# Patient Record
Sex: Female | Born: 1940 | Race: White | Hispanic: No | Marital: Married | Smoking: Former smoker
Health system: Southern US, Community
[De-identification: ages and names within clinical notes are randomized; demographics above are authoritative.]

## PROBLEM LIST (undated history)

## (undated) DIAGNOSIS — Z87442 Personal history of urinary calculi: Secondary | ICD-10-CM

## (undated) DIAGNOSIS — C50919 Malignant neoplasm of unspecified site of unspecified female breast: Secondary | ICD-10-CM

## (undated) DIAGNOSIS — K859 Acute pancreatitis without necrosis or infection, unspecified: Secondary | ICD-10-CM

## (undated) DIAGNOSIS — E785 Hyperlipidemia, unspecified: Secondary | ICD-10-CM

## (undated) DIAGNOSIS — F411 Generalized anxiety disorder: Secondary | ICD-10-CM

## (undated) DIAGNOSIS — J449 Chronic obstructive pulmonary disease, unspecified: Secondary | ICD-10-CM

## (undated) DIAGNOSIS — F32A Depression, unspecified: Secondary | ICD-10-CM

## (undated) DIAGNOSIS — D649 Anemia, unspecified: Secondary | ICD-10-CM

## (undated) DIAGNOSIS — R0600 Dyspnea, unspecified: Secondary | ICD-10-CM

## (undated) DIAGNOSIS — K219 Gastro-esophageal reflux disease without esophagitis: Secondary | ICD-10-CM

## (undated) DIAGNOSIS — D126 Benign neoplasm of colon, unspecified: Secondary | ICD-10-CM

## (undated) DIAGNOSIS — I700 Atherosclerosis of aorta: Secondary | ICD-10-CM

## (undated) DIAGNOSIS — H3530 Unspecified macular degeneration: Secondary | ICD-10-CM

## (undated) DIAGNOSIS — R112 Nausea with vomiting, unspecified: Secondary | ICD-10-CM

## (undated) DIAGNOSIS — E119 Type 2 diabetes mellitus without complications: Secondary | ICD-10-CM

## (undated) DIAGNOSIS — M1990 Unspecified osteoarthritis, unspecified site: Secondary | ICD-10-CM

## (undated) DIAGNOSIS — N2889 Other specified disorders of kidney and ureter: Secondary | ICD-10-CM

## (undated) DIAGNOSIS — J189 Pneumonia, unspecified organism: Secondary | ICD-10-CM

## (undated) DIAGNOSIS — K862 Cyst of pancreas: Secondary | ICD-10-CM

## (undated) DIAGNOSIS — Z9889 Other specified postprocedural states: Secondary | ICD-10-CM

## (undated) DIAGNOSIS — M797 Fibromyalgia: Secondary | ICD-10-CM

## (undated) DIAGNOSIS — I10 Essential (primary) hypertension: Secondary | ICD-10-CM

## (undated) DIAGNOSIS — I82409 Acute embolism and thrombosis of unspecified deep veins of unspecified lower extremity: Secondary | ICD-10-CM

## (undated) DIAGNOSIS — I6529 Occlusion and stenosis of unspecified carotid artery: Secondary | ICD-10-CM

## (undated) DIAGNOSIS — K5790 Diverticulosis of intestine, part unspecified, without perforation or abscess without bleeding: Secondary | ICD-10-CM

## (undated) DIAGNOSIS — R000 Tachycardia, unspecified: Secondary | ICD-10-CM

## (undated) DIAGNOSIS — Z5189 Encounter for other specified aftercare: Secondary | ICD-10-CM

## (undated) DIAGNOSIS — Z85828 Personal history of other malignant neoplasm of skin: Secondary | ICD-10-CM

## (undated) DIAGNOSIS — B059 Measles without complication: Secondary | ICD-10-CM

## (undated) DIAGNOSIS — B269 Mumps without complication: Secondary | ICD-10-CM

## (undated) DIAGNOSIS — R911 Solitary pulmonary nodule: Secondary | ICD-10-CM

## (undated) DIAGNOSIS — K5900 Constipation, unspecified: Secondary | ICD-10-CM

## (undated) DIAGNOSIS — Z8489 Family history of other specified conditions: Secondary | ICD-10-CM

## (undated) DIAGNOSIS — L821 Other seborrheic keratosis: Secondary | ICD-10-CM

## (undated) DIAGNOSIS — Z87898 Personal history of other specified conditions: Secondary | ICD-10-CM

## (undated) DIAGNOSIS — M5030 Other cervical disc degeneration, unspecified cervical region: Secondary | ICD-10-CM

## (undated) DIAGNOSIS — M810 Age-related osteoporosis without current pathological fracture: Secondary | ICD-10-CM

## (undated) DIAGNOSIS — I499 Cardiac arrhythmia, unspecified: Secondary | ICD-10-CM

## (undated) HISTORY — DX: Encounter for other specified aftercare: Z51.89

## (undated) HISTORY — DX: Essential (primary) hypertension: I10

## (undated) HISTORY — DX: Type 2 diabetes mellitus without complications: E11.9

## (undated) HISTORY — DX: Solitary pulmonary nodule: R91.1

## (undated) HISTORY — PX: BASAL CELL CARCINOMA EXCISION: SHX1214

## (undated) HISTORY — DX: Generalized anxiety disorder: F41.1

## (undated) HISTORY — DX: Age-related osteoporosis without current pathological fracture: M81.0

## (undated) HISTORY — DX: Other seborrheic keratosis: L82.1

## (undated) HISTORY — DX: Hyperlipidemia, unspecified: E78.5

## (undated) HISTORY — DX: Unspecified macular degeneration: H35.30

## (undated) HISTORY — DX: Personal history of other specified conditions: Z87.898

## (undated) HISTORY — DX: Unspecified osteoarthritis, unspecified site: M19.90

## (undated) HISTORY — DX: Occlusion and stenosis of unspecified carotid artery: I65.29

## (undated) HISTORY — DX: Acute pancreatitis without necrosis or infection, unspecified: K85.90

## (undated) HISTORY — PX: COLONOSCOPY: SHX174

## (undated) HISTORY — DX: Malignant neoplasm of unspecified site of unspecified female breast: C50.919

## (undated) HISTORY — DX: Atherosclerosis of aorta: I70.0

## (undated) HISTORY — PX: POLYPECTOMY: SHX149

## (undated) HISTORY — PX: TONSILLECTOMY: SUR1361

## (undated) HISTORY — DX: Benign neoplasm of colon, unspecified: D12.6

## (undated) HISTORY — PX: PARTIAL HYSTERECTOMY: SHX80

## (undated) HISTORY — PX: BREAST REDUCTION SURGERY: SHX8

## (undated) HISTORY — PX: APPENDECTOMY: SHX54

## (undated) HISTORY — DX: Cyst of pancreas: K86.2

## (undated) HISTORY — DX: Other specified disorders of kidney and ureter: N28.89

## (undated) HISTORY — PX: MASTECTOMY: SHX3

## (undated) HISTORY — PX: ABDOMINAL HYSTERECTOMY: SHX81

## (undated) HISTORY — PX: FOOT SURGERY: SHX648

## (undated) HISTORY — PX: CATARACT EXTRACTION: SUR2

## (undated) HISTORY — PX: SHOULDER SURGERY: SHX246

---
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
## 1974-01-30 HISTORY — PX: BACK SURGERY: SHX140

## 1974-01-30 HISTORY — PX: ABDOMINAL HYSTERECTOMY: SHX81

## 1992-01-31 DIAGNOSIS — C50919 Malignant neoplasm of unspecified site of unspecified female breast: Secondary | ICD-10-CM

## 1992-01-31 HISTORY — PX: OTHER SURGICAL HISTORY: SHX169

## 1992-01-31 HISTORY — DX: Malignant neoplasm of unspecified site of unspecified female breast: C50.919

## 1997-07-06 ENCOUNTER — Ambulatory Visit (HOSPITAL_COMMUNITY): Admission: RE | Admit: 1997-07-06 | Discharge: 1997-07-06 | Payer: Self-pay | Admitting: Internal Medicine

## 1997-07-24 ENCOUNTER — Ambulatory Visit (HOSPITAL_COMMUNITY): Admission: RE | Admit: 1997-07-24 | Discharge: 1997-07-24 | Payer: Self-pay | Admitting: Internal Medicine

## 2000-01-31 DIAGNOSIS — I82409 Acute embolism and thrombosis of unspecified deep veins of unspecified lower extremity: Secondary | ICD-10-CM

## 2000-01-31 HISTORY — PX: KNEE SURGERY: SHX244

## 2000-01-31 HISTORY — DX: Acute embolism and thrombosis of unspecified deep veins of unspecified lower extremity: I82.409

## 2000-10-11 ENCOUNTER — Ambulatory Visit: Admission: RE | Admit: 2000-10-11 | Discharge: 2000-10-11 | Payer: Self-pay | Admitting: Orthopedic Surgery

## 2001-01-17 ENCOUNTER — Ambulatory Visit: Admission: RE | Admit: 2001-01-17 | Discharge: 2001-01-17 | Payer: Self-pay | Admitting: Orthopedic Surgery

## 2001-02-19 ENCOUNTER — Ambulatory Visit (HOSPITAL_COMMUNITY): Admission: RE | Admit: 2001-02-19 | Discharge: 2001-02-19 | Payer: Self-pay | Admitting: Orthopedic Surgery

## 2002-11-18 ENCOUNTER — Ambulatory Visit (HOSPITAL_COMMUNITY): Admission: RE | Admit: 2002-11-18 | Discharge: 2002-11-18 | Payer: Self-pay | Admitting: Internal Medicine

## 2003-01-28 ENCOUNTER — Other Ambulatory Visit: Admission: RE | Admit: 2003-01-28 | Discharge: 2003-01-28 | Payer: Self-pay | Admitting: Obstetrics and Gynecology

## 2003-06-03 ENCOUNTER — Ambulatory Visit (HOSPITAL_COMMUNITY): Admission: RE | Admit: 2003-06-03 | Discharge: 2003-06-03 | Payer: Self-pay | Admitting: Internal Medicine

## 2003-06-12 ENCOUNTER — Ambulatory Visit (HOSPITAL_COMMUNITY): Admission: RE | Admit: 2003-06-12 | Discharge: 2003-06-12 | Payer: Self-pay | Admitting: Internal Medicine

## 2003-12-08 ENCOUNTER — Ambulatory Visit: Payer: Self-pay | Admitting: Internal Medicine

## 2003-12-29 ENCOUNTER — Ambulatory Visit: Payer: Self-pay | Admitting: Internal Medicine

## 2004-01-28 ENCOUNTER — Ambulatory Visit: Payer: Self-pay | Admitting: Pulmonary Disease

## 2004-02-17 ENCOUNTER — Other Ambulatory Visit: Admission: RE | Admit: 2004-02-17 | Discharge: 2004-02-17 | Payer: Self-pay | Admitting: Obstetrics and Gynecology

## 2004-03-07 ENCOUNTER — Ambulatory Visit: Payer: Self-pay | Admitting: Internal Medicine

## 2004-04-04 ENCOUNTER — Ambulatory Visit: Payer: Self-pay | Admitting: Internal Medicine

## 2004-08-24 ENCOUNTER — Ambulatory Visit: Payer: Self-pay | Admitting: Adult Health

## 2004-09-08 ENCOUNTER — Ambulatory Visit: Payer: Self-pay | Admitting: Internal Medicine

## 2005-03-22 ENCOUNTER — Ambulatory Visit: Payer: Self-pay | Admitting: Gastroenterology

## 2005-04-12 ENCOUNTER — Ambulatory Visit: Payer: Self-pay | Admitting: Gastroenterology

## 2006-01-30 HISTORY — PX: KNEE ARTHROSCOPY: SUR90

## 2006-03-12 ENCOUNTER — Ambulatory Visit (HOSPITAL_BASED_OUTPATIENT_CLINIC_OR_DEPARTMENT_OTHER): Admission: RE | Admit: 2006-03-12 | Discharge: 2006-03-12 | Payer: Self-pay | Admitting: Orthopedic Surgery

## 2006-05-08 ENCOUNTER — Ambulatory Visit: Payer: Self-pay | Admitting: Gastroenterology

## 2006-05-08 LAB — CONVERTED CEMR LAB
Basophils Absolute: 0 10*3/uL (ref 0.0–0.1)
Basophils Relative: 0.7 % (ref 0.0–1.0)
Eosinophils Absolute: 0.1 10*3/uL (ref 0.0–0.6)
Eosinophils Relative: 1.1 % (ref 0.0–5.0)
HCT: 45.3 % (ref 36.0–46.0)
Hemoglobin: 15.5 g/dL — ABNORMAL HIGH (ref 12.0–15.0)
Lymphocytes Relative: 30.5 % (ref 12.0–46.0)
MCHC: 34.2 g/dL (ref 30.0–36.0)
MCV: 94 fL (ref 78.0–100.0)
Monocytes Absolute: 0.6 10*3/uL (ref 0.2–0.7)
Monocytes Relative: 8.4 % (ref 3.0–11.0)
Neutro Abs: 4.2 10*3/uL (ref 1.4–7.7)
Neutrophils Relative %: 59.3 % (ref 43.0–77.0)
Platelets: 332 10*3/uL (ref 150–400)
RBC: 4.82 M/uL (ref 3.87–5.11)
RDW: 11.6 % (ref 11.5–14.6)
Sed Rate: 15 mm/hr (ref 0–25)
WBC: 7 10*3/uL (ref 4.5–10.5)

## 2006-05-22 ENCOUNTER — Ambulatory Visit: Payer: Self-pay | Admitting: Gastroenterology

## 2006-06-18 ENCOUNTER — Ambulatory Visit: Payer: Self-pay | Admitting: Internal Medicine

## 2006-06-19 LAB — CONVERTED CEMR LAB
ALT: 11 units/L (ref 0–40)
AST: 20 units/L (ref 0–37)
Albumin: 4 g/dL (ref 3.5–5.2)
Alkaline Phosphatase: 86 units/L (ref 39–117)
BUN: 13 mg/dL (ref 6–23)
Basophils Absolute: 0 10*3/uL (ref 0.0–0.1)
Basophils Relative: 0 % (ref 0.0–1.0)
Bilirubin Urine: NEGATIVE
Bilirubin, Direct: 0.2 mg/dL (ref 0.0–0.3)
CO2: 32 meq/L (ref 19–32)
Calcium: 9.2 mg/dL (ref 8.4–10.5)
Chloride: 106 meq/L (ref 96–112)
Cholesterol: 176 mg/dL (ref 0–200)
Creatinine, Ser: 0.7 mg/dL (ref 0.4–1.2)
Crystals: NEGATIVE
Eosinophils Absolute: 0.1 10*3/uL (ref 0.0–0.6)
Eosinophils Relative: 1 % (ref 0.0–5.0)
GFR calc Af Amer: 108 mL/min
GFR calc non Af Amer: 89 mL/min
Glucose, Bld: 105 mg/dL — ABNORMAL HIGH (ref 70–99)
HCT: 40 % (ref 36.0–46.0)
HDL: 63.4 mg/dL (ref >39.0)
Hemoglobin: 13.9 g/dL (ref 12.0–15.0)
Ketones, ur: NEGATIVE mg/dL
LDL Cholesterol: 85 mg/dL (ref 0–99)
Lymphocytes Relative: 30.9 % (ref 12.0–46.0)
MCHC: 34.8 g/dL (ref 30.0–36.0)
MCV: 93.8 fL (ref 78.0–100.0)
Monocytes Absolute: 0.3 10*3/uL (ref 0.2–0.7)
Monocytes Relative: 4.5 % (ref 3.0–11.0)
Neutro Abs: 4 10*3/uL (ref 1.4–7.7)
Neutrophils Relative %: 63.6 % (ref 43.0–77.0)
Nitrite: NEGATIVE
Platelets: 268 10*3/uL (ref 150–400)
Potassium: 4.3 meq/L (ref 3.5–5.1)
RBC: 4.27 M/uL (ref 3.87–5.11)
RDW: 11.9 % (ref 11.5–14.6)
Sodium: 142 meq/L (ref 135–145)
Specific Gravity, Urine: 1.015 (ref 1.000–1.03)
TSH: 1.45 microintl units/mL (ref 0.35–5.50)
Total Bilirubin: 0.5 mg/dL (ref 0.3–1.2)
Total CHOL/HDL Ratio: 2.8
Total Protein: 6.7 g/dL (ref 6.0–8.3)
Triglycerides: 137 mg/dL (ref 0–149)
Urine Glucose: NEGATIVE mg/dL
Urobilinogen, UA: 0.2 (ref 0.0–1.0)
VLDL: 27 mg/dL (ref 0–40)
WBC: 6.4 10*3/uL (ref 4.5–10.5)
pH: 6 (ref 5.0–8.0)

## 2006-08-29 ENCOUNTER — Ambulatory Visit: Payer: Self-pay | Admitting: Internal Medicine

## 2006-08-29 LAB — CONVERTED CEMR LAB
ALT: 21 units/L (ref 0–35)
AST: 36 units/L (ref 0–37)
Albumin: 3.8 g/dL (ref 3.5–5.2)
Alkaline Phosphatase: 74 units/L (ref 39–117)
BUN: 9 mg/dL (ref 6–23)
Basophils Absolute: 0 10*3/uL (ref 0.0–0.1)
Basophils Relative: 0.4 % (ref 0.0–1.0)
Bilirubin, Direct: 0.1 mg/dL (ref 0.0–0.3)
CO2: 31 meq/L (ref 19–32)
Calcium: 9.4 mg/dL (ref 8.4–10.5)
Chloride: 102 meq/L (ref 96–112)
Creatinine, Ser: 0.6 mg/dL (ref 0.4–1.2)
Eosinophils Absolute: 0.1 10*3/uL (ref 0.0–0.6)
Eosinophils Relative: 1.2 % (ref 0.0–5.0)
GFR calc Af Amer: 129 mL/min
GFR calc non Af Amer: 106 mL/min
Glucose, Bld: 128 mg/dL — ABNORMAL HIGH (ref 70–99)
HCT: 38.5 % (ref 36.0–46.0)
Hemoglobin: 13.4 g/dL (ref 12.0–15.0)
Lymphocytes Relative: 25 % (ref 12.0–46.0)
MCHC: 34.8 g/dL (ref 30.0–36.0)
MCV: 94.4 fL (ref 78.0–100.0)
Monocytes Absolute: 0.4 10*3/uL (ref 0.2–0.7)
Monocytes Relative: 7.6 % (ref 3.0–11.0)
Neutro Abs: 3.6 10*3/uL (ref 1.4–7.7)
Neutrophils Relative %: 65.8 % (ref 43.0–77.0)
Platelets: 299 10*3/uL (ref 150–400)
Potassium: 4 meq/L (ref 3.5–5.1)
Pro B Natriuretic peptide (BNP): 10 pg/mL (ref 0.0–100.0)
RBC: 4.08 M/uL (ref 3.87–5.11)
RDW: 12.8 % (ref 11.5–14.6)
Sed Rate: 36 mm/hr — ABNORMAL HIGH (ref 0–25)
Sodium: 140 meq/L (ref 135–145)
Total Bilirubin: 0.7 mg/dL (ref 0.3–1.2)
Total Protein: 7 g/dL (ref 6.0–8.3)
WBC: 5.5 10*3/uL (ref 4.5–10.5)

## 2006-09-05 ENCOUNTER — Ambulatory Visit: Payer: Self-pay | Admitting: Internal Medicine

## 2006-09-10 ENCOUNTER — Ambulatory Visit: Payer: Self-pay | Admitting: Internal Medicine

## 2006-09-23 ENCOUNTER — Emergency Department (HOSPITAL_COMMUNITY): Admission: EM | Admit: 2006-09-23 | Discharge: 2006-09-24 | Payer: Self-pay | Admitting: Emergency Medicine

## 2006-10-03 ENCOUNTER — Ambulatory Visit: Payer: Self-pay | Admitting: Internal Medicine

## 2006-10-18 ENCOUNTER — Ambulatory Visit: Admission: RE | Admit: 2006-10-18 | Discharge: 2006-10-18 | Payer: Self-pay | Admitting: Internal Medicine

## 2006-10-18 ENCOUNTER — Ambulatory Visit: Payer: Self-pay | Admitting: Internal Medicine

## 2006-10-18 ENCOUNTER — Encounter: Payer: Self-pay | Admitting: Internal Medicine

## 2006-10-21 ENCOUNTER — Encounter: Payer: Self-pay | Admitting: Internal Medicine

## 2006-10-31 ENCOUNTER — Ambulatory Visit: Payer: Self-pay | Admitting: Internal Medicine

## 2006-11-14 ENCOUNTER — Ambulatory Visit: Payer: Self-pay | Admitting: Internal Medicine

## 2007-04-22 ENCOUNTER — Ambulatory Visit (HOSPITAL_COMMUNITY): Admission: RE | Admit: 2007-04-22 | Discharge: 2007-04-22 | Payer: Self-pay | Admitting: Obstetrics and Gynecology

## 2007-12-13 ENCOUNTER — Emergency Department (HOSPITAL_COMMUNITY): Admission: EM | Admit: 2007-12-13 | Discharge: 2007-12-13 | Payer: Self-pay | Admitting: Emergency Medicine

## 2008-01-02 ENCOUNTER — Encounter: Admission: RE | Admit: 2008-01-02 | Discharge: 2008-01-27 | Payer: Self-pay | Admitting: Internal Medicine

## 2008-04-02 ENCOUNTER — Encounter: Payer: Self-pay | Admitting: *Deleted

## 2008-10-06 ENCOUNTER — Ambulatory Visit (HOSPITAL_COMMUNITY): Admission: RE | Admit: 2008-10-06 | Discharge: 2008-10-06 | Payer: Self-pay | Admitting: Internal Medicine

## 2008-10-18 ENCOUNTER — Encounter: Admission: RE | Admit: 2008-10-18 | Discharge: 2008-10-18 | Payer: Self-pay | Admitting: Orthopedic Surgery

## 2008-12-09 ENCOUNTER — Encounter: Admission: RE | Admit: 2008-12-09 | Discharge: 2009-01-27 | Payer: Self-pay | Admitting: Internal Medicine

## 2009-04-16 ENCOUNTER — Encounter: Admission: RE | Admit: 2009-04-16 | Discharge: 2009-04-16 | Payer: Self-pay | Admitting: Internal Medicine

## 2010-02-26 ENCOUNTER — Encounter
Admission: RE | Admit: 2010-02-26 | Discharge: 2010-02-26 | Payer: Self-pay | Source: Home / Self Care | Attending: Orthopedic Surgery | Admitting: Orthopedic Surgery

## 2010-03-01 NOTE — Assessment & Plan Note (Signed)
    Current Allergies: ! ASA           ]

## 2010-04-26 ENCOUNTER — Encounter (INDEPENDENT_AMBULATORY_CARE_PROVIDER_SITE_OTHER): Payer: Self-pay | Admitting: *Deleted

## 2010-05-03 NOTE — Letter (Signed)
Summary: Pre Visit Letter Revised  [facility]  [address] [place], [place] [address]   Phone: [phone]  Fax: [phone]        04/26/2010 MRN: [id number] Renee [name] [address] [place], [place]  [address]             Procedure Date:  05/23/2010 @ 11:00   Recall colon-Dr. Jarold Motto   Welcome to the Gastroenterology Division at [facility].    You are scheduled to see a nurse for your pre-procedure visit on 05/10/2010 at 10:00 on the 3rd floor at [facility], [address]  We ask that you try to arrive at our office 15 minutes prior to your appointment time to allow for check-in.  Please take a minute to review the attached form.  If you answer "Yes" to one or more of the questions on the first page, we ask that you call the person listed at your earliest opportunity.  If you answer "No" to all of the questions, please complete the rest of the form and bring it to your appointment.    Your nurse visit will consist of discussing your medical and surgical history, your immediate family medical history, and your medications.   If you are unable to list all of your medications on the form, please bring the medication bottles to your appointment and we will list them.  We will need to be aware of both prescribed and over the counter drugs.  We will need to know exact dosage information as well.    Please be prepared to read and sign documents such as consent forms, a financial agreement, and acknowledgement forms.  If necessary, and with your consent, a friend or relative is welcome to sit-in on the nurse visit with you.  Please bring your insurance card so that we may make a copy of it.  If your insurance requires a referral to see a specialist, please bring your referral form from your primary care physician.  No co-pay is required for this nurse visit.     If you cannot keep your appointment, please call [phone] to cancel or reschedule  prior to your appointment date.  This allows [place] the opportunity to schedule an appointment for another patient in need of care.    Thank you for choosing [facility] for your medical needs.  We appreciate the opportunity to care for you.  Please visit [place] at our website  to learn more about our practice.  Sincerely, The Gastroenterology Division

## 2010-05-12 ENCOUNTER — Ambulatory Visit (AMBULATORY_SURGERY_CENTER): Payer: Medicare Other | Admitting: *Deleted

## 2010-05-12 VITALS — Ht 63.0 in | Wt 144.2 lb

## 2010-05-12 DIAGNOSIS — Z860100 Personal history of colon polyps, unspecified: Secondary | ICD-10-CM

## 2010-05-12 MED ORDER — PEG-KCL-NACL-NASULF-NA ASC-C 100 G PO SOLR: ORAL | Status: DC

## 2010-05-20 ENCOUNTER — Encounter: Payer: Self-pay | Admitting: *Deleted

## 2010-05-23 ENCOUNTER — Encounter: Payer: Self-pay | Admitting: Gastroenterology

## 2010-05-23 ENCOUNTER — Ambulatory Visit (AMBULATORY_SURGERY_CENTER): Payer: Medicare Other | Admitting: Gastroenterology

## 2010-05-23 VITALS — BP 110/58 | HR 79 | Temp 98.0°F | Resp 18 | Ht 63.0 in | Wt 140.0 lb

## 2010-05-23 DIAGNOSIS — K620 Anal polyp: Secondary | ICD-10-CM

## 2010-05-23 DIAGNOSIS — K5730 Diverticulosis of large intestine without perforation or abscess without bleeding: Secondary | ICD-10-CM

## 2010-05-23 DIAGNOSIS — Z86010 Personal history of colonic polyps: Secondary | ICD-10-CM

## 2010-05-23 DIAGNOSIS — D126 Benign neoplasm of colon, unspecified: Secondary | ICD-10-CM

## 2010-05-23 DIAGNOSIS — K648 Other hemorrhoids: Secondary | ICD-10-CM

## 2010-05-23 MED ORDER — SODIUM CHLORIDE 0.9 % IV SOLN: 500.0000 mL | INTRAVENOUS | Status: DC

## 2010-05-23 NOTE — Patient Instructions (Signed)
Follow discharge instructions.  Continue previous medications.  Begin a high fiber diet, and increase your fluid intake.  Repeat colonoscopy in 5 years.

## 2010-05-24 ENCOUNTER — Telehealth: Payer: Self-pay | Admitting: *Deleted

## 2010-05-24 NOTE — Telephone Encounter (Signed)

## 2010-05-27 ENCOUNTER — Encounter: Payer: Self-pay | Admitting: Gastroenterology

## 2010-06-14 NOTE — Assessment & Plan Note (Signed)
[facility]                             PULMONARY OFFICE NOTE   NAME:Renee [name], [name]                     MRN:          [id number]  DATE:09/05/2006                            DOB:          February 23, 1940    HISTORY OF PRESENT ILLNESS:  Patient is a 70 year old white female  patient of Dr. Thurston Hole who is a former smoker who returns today for a 1  week followup.  Last visit patient had been having some increased  shortness of breath over the last 3 months.  Patient was felt to have  some upper airway instability and was recommended to stop her ACE  inhibitors.  Stop all fish oil supplements and Fosamax.  Patient  ambulatory pulse ox was unremarkable with no desaturations noted.  Lab  work was essentially unremarkable except for a slightly elevated sed  rate at 36.  Her BNP level was at 10.  Patient returns back today  reporting that she is substantially improved, shortness of breath is  totally resolved and her back and chest discomforts has also resolved.  Patient was supposed to have brought her medications in today to review  however only brought a list.  Her list is correct with our MAR.  Patient  does complain that over the last 2 days she has had a minimally  productive cough that seems to be improving.   PAST MEDICAL HISTORY:  Reviewed.   CURRENT MEDICATIONS:  Reviewed.   PHYSICAL EXAMINATION:  Patient is a very pleasant female in no acute  distress.  She is afebrile.  Blood pressure is 150/96, O2 saturation is  96% on room air.  HEENT:  Unremarkable.  NECK:  Supple without cervical adenopathy.  No JVD.  LUNGS:  Sounds are clear.  CARDIAC:  Regular rate and rhythm.  ABDOMEN:  Soft and nontender.  EXTREMITIES:  Warm without any edema.   IMPRESSION AND PLAN:  1. Dyspnea, questionable etiology with suspected upper airway      instability.  Patient's symptoms are much improved off of her      angiotensin converting enzyme inhibitors, fish oil  capsules and      Fosamax.  Patient's symptoms have totally resolved.  Patient will      continue off of fish oil supplements.  Patient's blood pressure is      slightly elevated and we will add in a new anti-hypertensive.  2. Hypertension, recently taken off of angiotensin converting enzyme      inhibitors due to upper airway irritation.  Patient will begin      Benicar 20 mg daily.  Will return back here for followup blood      pressure.  3. Complex medication regimen.  Patient's medication list was      reviewed.  Computerized medication calendar was completed for this      patient and reviewed in detail.      Rubye Oaks, NP  Electronically Signed      Charlaine Dalton. Sherene Sires, MD, [facility]  Electronically Signed   TP/MedQ  DD: 09/05/2006  DT: 09/05/2006  Job #: [id number]

## 2010-06-14 NOTE — Assessment & Plan Note (Signed)
[facility]                             PULMONARY OFFICE NOTE   NAME:Renee [name], Renee [name]                     MRN:          [id number]  DATE:11/14/2006                            DOB:          1940-12-11    HISTORY OF PRESENT ILLNESS:  The patient is a pleasant 70 year old white  female patient of Dr. Thurston Hole who has a known history of upper airway  cough syndrome that returns today for a 2-week followup.  Patient  recently underwent a bronchoscopy which showed no evidence of  endobronchial process.  Patient has aggressively been treated for reflux  and cough suppression.  Patient returns today reporting that her cough  is substantially improved and now has totally resolved.  Patient does  complain that she continues to have hoarseness which is not totally  resolved.  Patient had been recommended to use some Chlor-Trimeton.  Patient reports that she used it for a few days but has not used it over  the last couple weeks.  Patient denies any flare in symptoms since  discontinuing off of her Reglan.   PAST MEDICAL HISTORY:  Reviewed.   CURRENT MEDICATIONS:  Reviewed.   PHYSICAL EXAM:  Patient is a pleasant female in no acute distress, she  is afebrile with stable vital signs.  The O2 saturation is 96% on room  air.  HEENT:  Nasal mucosa is with some mild nonspecific turbinate edema .  Nontender, nondistended.  Posterior pharynx is clear without any  exudate.  NECK:  Supple without cervical adenopathy.  No JVD.  LUNG SOUNDS:  Clear to auscultation.  CARDIAC:  Regular rate.  ABDOMEN:  Soft and nontender.  EXTREMITIES:  Warm without any edema.   IMPRESSION AND PLAN:  1. Upper airway cough syndrome, much improved with aggressive reflux      and cough suppression regimen.  Patient will continue on her      present regimen and follow back up with Dr. Sherene Sires in 4 weeks or      sooner.  2. Persistent hoarseness.  Possibly secondary to rhinitis.  Patient  has been recommended to add Chlor-Trimeton back into her regimen on      an as-needed basis.  If symptoms are not improved within the next 3-      4 weeks, patient will need referral to [facility], Nose [facility] Throat for      further evaluation; to note, she is a previous smoker.  3. Complex medication regimen.  Patient medications were reviewed in      detail.  Patient      education was provided.  A computerized medication calendar was      completed for this patient and reviewed in detail.      Rubye Oaks, NP  Electronically Signed      Charlaine Dalton. Sherene Sires, MD, [facility]  Electronically Signed   TP/MedQ  DD: 11/14/2006  DT: 11/15/2006  Job #: [id number]

## 2010-06-14 NOTE — Assessment & Plan Note (Signed)
[facility]                             PULMONARY OFFICE NOTE   NAME:[name], Renee [name]                     MRN:          [id number]  DATE:09/10/2006                            DOB:          [birth year]    HISTORY:  This is a 70 year old white female with abrupt onset dyspnea  three months ago which was felt to be somewhat atypical and not  explained on examination or by walking saturations. I asked her to stop  ACE inhibitors and she said that she appeared to be much better on her  followup on July 21, at which time her blood pressure was 150/96 and she  was started back on Benicar. Since then, she has been having several  coughing paroxysms, dry in nature, but denies any recurrent dyspnea and  denies any chest pain, fevers, chills, sweats or excess sputum  production. There is no pattern to the cough in terms of the day or  obvious triggering factors. She has tried Delsym and Mucinex without any  benefit.   PHYSICAL EXAMINATION:  She is a depressed-appearing, ambulatory white  female, with a barking harsh quality upper airway cough that is dry  sounding. She is afebrile with stable vital signs.  HEENT: Reveals normal turbinates. Oropharynx is clear without any  evidence of excessive post-nasal drainage or cobblestoning.  NECK: Supple without cervical adenopathy or tenderness. Trachea is  midline without thyromegaly.  LUNGS: Lung fields are perfectly clear bilaterally to auscultation and  percussion with no coughing elicited on inspiratory or expiratory  maneuvers.  HEART: Regular rate and rhythm without murmur, gallop or rub.  ABDOMEN: Soft, benign.  EXTREMITIES: Warm without calf tenderness, cyanosis, clubbing or edema.   Hemoglobin saturation is 97% on room air.   Chest x-ray was ordered.   LABORATORY DATA:  From her previous visit for unexplained dyspnea  indicated a sed rate of 36, normal CBC, chemistry profile with a bicarb  level of 31.   BNP of 10.   We had walked her around the office with 3 full laps on the last visit  with no desaturation noted. Chest x-ray from July 30, was normal. Chest  x-ray is repeated today.   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  Previously, she had dyspnea of unclear etiology that could not be  reproduced in the office. I suspect that one unifying possibility is  reflux. This would be why she could not tolerate ACE inhibitors and why  now that she is coughing she cannot stop coughing. That is, any trauma  to the upper airway is going to be perpetuated in a patient who has  chronic reflux. I have recommended specifically that she not only  continue taking Nexium perfectly regularly before breakfast, but also  add Reglan 10 mg before meals and at bedtime while suppressing cough  with a combination of Delsym and Reglan.   I was discouraged that we had generated a medication calendar for her on  our last visit which she failed to bring back for followup. I emphasized  the importance of this again today  and regenerated a new calendar for  her dated today and then went line-by-line over it adding the plan to  add Reglan. I also at this point would reduce Benicar (not that I think  it is contributing to her cough, but is probably overtreating her blood  pressure) and is to take Delsym two teaspoons q12 supplement by Tramadol  50 mg q4 and only these two (I would not even use medicated lozenges  because they may have mint or menthol in them) and emphasized this issue  to the patient as well in the context of a GERD diet review.   FOLLOWUP:  Will see the patient back and is scheduled for followup  within the next 4 weeks and will see her back sooner if needed for  followup within two weeks anyway.     Charlaine Dalton. Sherene Sires, MD, [facility]  Electronically Signed    MBW/MedQ  DD: 09/10/2006  DT: 09/11/2006  Job #: [id number]   cc:   Soyla Murphy. Renee Crigler, M.D.

## 2010-06-14 NOTE — Assessment & Plan Note (Signed)
[facility]                             PULMONARY OFFICE NOTE   NAME:Rubenstein, YERALDIN LITZENBERGER                     MRN:          [id number]  DATE:08/29/2006                            DOB:          07-11-1940    PULMONARY EXTENDED ACUTE OFFICE EVALUATION:   HISTORY:  A 70 year old white female, former smoker, with new onset of  dyspnea over the last three months that occurred fairly abruptly but has  not progressed.  It occurs only when she is active.  She states that she  can walk flat in the home from room to room but cannot walk to the  mailbox without giving out due to dyspnea.  She has also noticed  fleeting episodes of chest discomfort that have no direct relation to  the dyspnea, do not occur with exertion, and are not reproducible but  last a few seconds in the left posterior chest.  She denies any  significant cough, fevers, chills, sweats, pleuritic component of the  pain, orthopnea, PND, or leg swelling.   PAST MEDICAL HISTORY:  1. Benign palpitations, workup with a normal echocardiogram in 2002.  2. Chronic fatigue.  3. Breast cancer, status post bilateral mastectomy in June, 1993.  4. Status post hysterectomy remotely.  5. History of GERD, followed by Dr. Jarold Motto.  6. Diverticulosis by colonoscopy in 2007.  7. Hyperlipidemia.  8. Osteopenia.  9. Borderline hypertension.   ALLERGIES:  None known.   MEDICATIONS:  Taken in detail from the patient's memory.  See column  dated July 30, 2006 for a list.   SOCIAL HISTORY:  She quit smoking about 12 years ago.  She denies any  significant alcohol use.   FAMILY HISTORY:  Negative for any form of cancer.  Rheumatism is on her  father's side of the family.  No thyroid disease or premature heart  disease to her knowledge.  There was alcoholism in her father.   REVIEW OF SYSTEMS:  Taken in detail and significant for multiple aches  and pains, for which she sees Dr. Phylliss Bob.  She also has  osteopenia.   PHYSICAL EXAMINATION:  This is an anxious and depressed-appearing  ambulatory, mildly obese white female in no acute distress.  Her weight  is 174 pounds, which is no real change in baseline.  HEENT:  Unremarkable.  Her oropharynx is clear.  NECK:  Supple without cervical adenopathy or tenderness.  Trachea is  midline.  No thyromegaly.  LUNGS:  Completely clear bilaterally to auscultation and percussion.  HEART:  Regular rhythm without murmur, rub or gallop.  ABDOMEN:  Soft, benign.  EXTREMITIES:  Warm without calf tenderness, clubbing, cyanosis or edema.   Chest x-ray and labs are pending.   I reviewed Dr. Renaldo Reel notes from 2005 with no specific diagnosis other  than depression/fibromyalgia and DJD.   We walked the patient around three laps, 185 feet each, with no  desaturation or obvious tachypnea.   IMPRESSION:  1. New onset of dyspnea of undetermined etiology:  We could not      reproduce this here in the office, although the patient said  it is      perfectly reproducible at home.  This brings up the issue as to      whether there is a functional aspect or problem.  I am going to      first check a broad lab profile for her, check a chest x-ray, and      have her return in one week, having eliminated ACE inhibitors from      her medication list and check her blood pressure off of ACE      inhibitors.  If she needs an antihypertensive, I would prefer,      based on the fact she has unexplained dyspnea, that it not be an      ACE inhibitor.  I have also reviewed an optimal way to take acid      reflux medications (before meals, not after meals) and reviewed a      diet with her as well in writing.   When she returns in a week, I would like her to bring all of her  medicines back to generate a new medication calendar, separating the  maintenance versus p.r.n.'s and go forward from there.  If her dyspnea  remains unexplained, the next step would be a CT scan of the  chest,  looking for occult thromboembolic disease.  If her BNP is elevated above  100, then she needs an echocardiogram, looking for occult left or right  heart failure.     Charlaine Dalton. Sherene Sires, MD, [facility]  Electronically Signed    MBW/MedQ  DD: 08/29/2006  DT: 08/30/2006  Job #: [id number]   cc:   Areatha Keas, M.D.

## 2010-06-14 NOTE — Op Note (Signed)
Renee [name], Renee [name]              ACCOUNT NO.:  [id number]   MEDICAL RECORD NO.:  [id number]          PATIENT TYPE:  AMB   LOCATION:  CARD                         [facility]AC[facility]   PHYSICIAN:  Casimiro Needle B. Sherene Sires, MD, FCCPDATE OF BIRTH:  [birth year]   DATE OF PROCEDURE:  10/18/2006  DATE OF DISCHARGE:                               OPERATIVE REPORT   REFERRING PHYSICIAN:  This patient is self-referred.   PROCEDURE:  Fiberoptic bronchoscopy, diagnostic with lavage.   HISTORY AND INDICATIONS:  Please see dictated office notes available on  the E-Chart system.  This patient has had a chronic cough with a history  of breast cancer and no evidence of metastatic disease and a normal  chest x-ray, but the concern is that she might have endobronchial source  for the cough, specifically endobronchial metastasis.   She agreed to the procedure after a full discussion of the risks,  benefits, and alternatives in the office setting.  There has been no  change in her history or exam since the office notes.   DESCRIPTION OF PROCEDURE:  The patient was premedicated with a total of  75 mg intravenous Demerol and 5 mg intravenous Versed for added sedation  and cough suppression while being continuously monitored by surface ECG  and oximetry.  She also received 1% lidocaine by updraft nebulizer and  1% lidocaine to the right naris.   Using a standard video fiberoptic bronchoscope, the right naris was  easily cannulated with good visualization of the entire oropharynx and  larynx.  The were no upper airway abnormalities and the cords moved  normally.   Using additional 1% lidocaine , the tracheobronchial tree was explored  bilaterally with the following findings:  1. There were no significant endobronchial findings.  All the airways      opened widely at subsegmental level.  2. I randomly lavaged the right middle lobe with adequate return and      sent this for cytology, AFB, and fungal stain and  culture.   IMPRESSION:  Cough, unclear etiology, no evidence of endobronchial  source.  Follow-up will be arranged in the outpatient setting within two  weeks.      Charlaine Dalton. Sherene Sires, MD, [facility]  Electronically Signed     MBW/MEDQ  D:  10/18/2006  T:  10/18/2006  Job:  [id number]

## 2010-06-14 NOTE — Assessment & Plan Note (Signed)
[facility]                             PULMONARY OFFICE NOTE   NAME:Spindler, MYRIAN BOTELLO                     MRN:          [id number]  DATE:10/31/2006                            DOB:          [birth year]    HISTORY:  This is a 70 year old white female with new onset cough in  July 2008 with the sensation that she had something stuck in her chest  (she pointed to the right perihilar area) with a normal chest x-ray. She  subsequently underwent bronchoscopy which showed no evidence of an  endobronchial process and states that her cough subsequent to the  bronchoscopy is gone. She continues to have hoarseness and an urge her  to clear her throat, however like something is stuck in her throat  now.   The patient previously underwent upper endoscopy in 2002 with no  evidence of esophagitis then. Bronchoscopy was performed on 9/18 with  negative cytology and no endobronchial abnormalities.   The patient denies any nocturnal symptoms, fevers, chills, or sweats.  She does have hoarseness that worsens as the day goes on. She says that  she has the sensation of post-nasal drainage that Chlor-Trimeton  eliminates.   For full information on medications, please see fact sheet column dated  10/31/2006, noting that she is using an edited medication calendar that  is unambiguous, but no longer user-friendly because of all the edits.   PHYSICAL EXAMINATION:  GENERAL:  She indeed does clearly her throat  frequently during the exam.  HEENT:  Nasal turbinates normal. Oropharynx clear.  NECK:  Supple without cervical adenopathy or tenderness. Trachea is  midline.  LUNGS:  Fields perfectly clear bilaterally to auscultation and  percussion with no localized generalized weakness or rhonchi.  CARDIAC:  Regular rate and rhythm with no murmurs, rubs, or gallops.  ABDOMEN:  Soft and benign.  EXTREMITIES:  Warm without calf tenderness, cyanosis, clubbing, or  edema.   I  reviewed the findings from bronchoscopy with the patient in detail.   IMPRESSION:  Upper airway cough syndrome that has improved somewhat from  the combination of high dose PPI therapy, elimination of oils, GERD  diet, and the addition of the Reglan. In addition, she seems some better  with the use of Chlor-Trimeton to eliminate post-nasal drip syndrome  from the differential.   However, we have not totally eradicated her problem. Using the reverse  therapeutic trial concept, I am going to ask her to stop Reglan to see  if any of her symptoms exacerbate. If they do, clearly she needs more  aggressive treatment directed at GERD with perhaps b.i.d. Nexium,  nightly H2 blockers, and the addition back of Reglan until she sees her  GI physician of record, Dr. Jarold Motto.   If stopping the Reglan makes no difference, the next step in the workup  would be to proceed with a sinus CT scan and perhaps an ENT referral.   The patient continues to struggle with maintenance versus p.r.n.  concepts and I have asked her to return to our nurse practitioner for  the generation of a medication calendar  in two weeks and referral either  for sinus CT scan or to Dr. Jarold Motto depending on her response to the  above changes which were reviewed with her in writing today.     Charlaine Dalton. Sherene Sires, MD, [facility]  Electronically Signed    MBW/MedQ  DD: 10/31/2006  DT: 11/01/2006  Job #: [id number]   cc:   Soyla Murphy. Renne Crigler, M.D.

## 2010-06-14 NOTE — Assessment & Plan Note (Signed)
[facility]                             PULMONARY OFFICE NOTE   NAME:Renee [name], Renee [name]                     MRN:          [id number]  DATE:10/03/2006                            DOB:          Jun 05, 1940    PULMONARY/EXTENDED FOLLOW-UP OFFICE VISIT:   HISTORY:  This is a 70 year old white female with chronic cough dating  back now several months, associated with a sensation that she has got  something stuck in her chest.  (She presents to her right perihilar  area.)  It is better overall since stopping ACE inhibitors, but she  continues to feel that there is something she needs to cough up.  However, her cough is entirely dry in nature.  It is worse after  speaking, but much better after stopping ACE inhibitors, being treated  for reflux, then baseline.   She denies any unintended weight loss, pleuritic pain, fevers, chills,  sweats, significant dyspnea, orthopnea, PND, leg swelling, or overt  sinus symptoms.   For full inventory of medications, which are reviewed in detail, please  see face sheet, dated October 03, 2006, noting that she ran out of  Benicar yesterday and wants to know if she should continue it.   PHYSICAL EXAMINATION:  GENERAL:  She is a pleasant, ambulatory white  female in no acute distress.  VITAL SIGNS:  Blood pressure 130/80.  HEENT: Unremarkable. Oropharynx clear.  LUNG FIELDS:  Clear bilaterally to auscultation and percussion, with no  generalized or localized wheezing or rhonchi.  HEART:  Regular rate and rhythm, without murmur, gallop, or rub.  ABDOMEN:  Soft, benign.  EXTREMITIES: Warm, without calf tenderness, cyanosis, clubbing, or  edema.   Chest x-ray was reviewed with the patient from September 10, 2006, and is  normal.   IMPRESSION:  1. Chronic cyclical coughing that is better off of ACE inhibitors and      while being treated for reflux, but not completely eradicated at      this point.  There is concern because  of this patient's history of      breast cancer that she could very well have endobronchial      metastasis causing chronic cough, especially since she localizes      the area of cough to the right chest in the perihilar distribution.      I have reviewed the x-ray again today.  I see nothing there but      would recommend proceeding with bronchoscopy if the sensation      continues after the following regimen:      a.     First, add Chlor-Trimeton over the counter to eliminate       postnasal drip syndrome from the differential.      b.     A 6-day course of prednisone, along with aggressive cough       suppression with a combination of Delsym and tramadol, and a 5-day       course of prednisone.  2. Hypertension.  Note she stopped Benicar yesterday, with an adequate      blood pressure today,  but the Benicar most likely on board.      Based on her history of hypertension, I would recommend that we add      back Maxzide 25 both to treat hypertension and also improve calcium      balance, and I agreed to go ahead and see her in the short run      until she establishes with a primary care physician and we have      eradicated the cough.  She is to call me back within a week if not      better to proceed directly to bronchoscopy to examine the airways,      especially on the right for any evidence of endobronchial      abnormality or unexplained cough.     Charlaine Dalton. Sherene Sires, MD, [facility]  Electronically Signed    MBW/MedQ  DD: 10/03/2006  DT: 10/03/2006  Job #: [id number]

## 2010-06-17 NOTE — Assessment & Plan Note (Signed)
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   I have seen Renee [name] for many years and she had colonoscopy a year ago  which demonstrated diverticulosis.  She has a long history of acid  reflux disease and is on maintenance Nexium therapy.   She recently has gone on a diet high in nuts and fiber because she was  found to have mild glucose intolerance.  She is followed by Renee [name].  She had an onset of constant, sharp, lower abdominal pain 5 days  ago, but no nausea and vomiting, upper GI or hepatobiliary complaints.  She saw her gynecologist, had a negative GYN exam, and apparently a  negative stool hemoccult card.  She has not had previous pain similar to  this episode.  She has not had any recent abdominal x-rays or CT scans.   PAST MEDICAL HISTORY:  Remarkable for hypertension,  hypercholesterolemia, degenerative arthritis, breast cancer with removal  13 years ago and reconstructive surgery, current kidney stones, previous  breast lumpectomy, and appendectomy.  She has also had surgery because  of a ruptured disk in her back.  She additionally has recurrent pyuria,  history of palpitations, chronic fatigue, and restless leg syndrome.   MEDICATIONS:  1. Nexium 40 mg a day.  2. Multivitamins daily.  3. Crestor 20 mg a day.  4. Calcium with vitamin D twice a day.  5. Zetia daily.  6. Fish oil 4 capsules a day.   Denies drug allergies.   FAMILY HISTORY:  Noncontributory in terms of gastrointestinal problems.   SOCIAL HISTORY:  The patient is married and lives with her husband.  She  is  retired.  She does not use alcohol or cigarettes.   REVIEW OF SYSTEMS:  Otherwise noncontributory, except for some mild  general malaise.  She denies currently cardiovascular, pulmonary,  genitourinary or gynecologic problems.  Her appetite is good and her  weight has been fairly stable.   PHYSICAL EXAMINATION:  She is a nontoxic, healthy-appearing white  female, appearing her stated age.  She is 5 feet, 5 inches tall and weighs 163 pounds.  Her blood pressure  is 138/82 and pulse was 70 and regular.  I could not appreciate stigmata of chronic liver disease.  Her chest was clear.  She was in a regular rhythm without murmurs, gallops, or rubs.  I could not appreciate abdominal extension, organomegaly, or masses.  She was tender in the left lower quadrant without rebound.  Bowel sounds  were normal.  Peripheral extremities were unremarkable.  Mental status was clear.  Inspection of the rectum was unremarkable and was the rectal exam.  There was hard, solid stool in the rectal vault and it was guaiac  negative.   ASSESSMENT:  Renee [name] appears to have acute diverticulitis which appears to  be mild in degree.  I cannot feel any masses, there was no rebound,  tenderness, or systemic complaints.   RECOMMENDATIONS:  1. Check CBC and diff.  2. I prescribed Cipro 500 mg twice a day, along with metronidazole 500      mg twice a day for a 2-week course.  Office followup in 2 weeks'      time.  3. Levsin p.r.n. sublingually every 4-6 hours with p.r.n. Darvocet 100      every 6 hours.  4. Low fiber diet as tolerated.  5. Continue other medications as listed above.     Renee Rea. Jarold Motto, MD, Renee [name], Renee [name]  Electronically Signed    DRP/MedQ  DD: 05/08/2006  DT: 05/08/2006  Job #: [id number]   cc:   Renee [name]. Renee [name], M.D.

## 2010-06-17 NOTE — Op Note (Signed)
NAMEASPEN, Renee [name]              ACCOUNT NO.:  [id number]   MEDICAL RECORD NO.:  [id number]          PATIENT TYPE:  AMB   LOCATION:  [facility]SC                          FACILITY:  [facility]   PHYSICIAN:  Robert A. Thurston Hole, M.D. DATE OF BIRTH:  1940-11-03   DATE OF PROCEDURE:  03/12/2006  DATE OF DISCHARGE:                               OPERATIVE REPORT   PREOPERATIVE DIAGNOSES:  1. Right knee medial and lateral meniscal tears with chondromalacia      and synovitis.  2. Left knee chondromalacia.   POSTOPERATIVE DIAGNOSES:  1. Right knee medial and lateral meniscal tears with chondromalacia      and synovitis.  2. Left knee chondromalacia.   PROCEDURES:  1. Right knee examination under anesthesia, followed by arthroscopic      partial medial and lateral meniscectomies.  2. Right knee chondroplasty with partial synovectomy.  3. Left knee cortisone injection.   SURGEON:  Elana Alm. Thurston Hole, M.D.   ASSISTANT:  None.   ANESTHESIA:  Local with MAC.   OPERATIVE TIME:  30 minutes.   COMPLICATIONS:  None.   INDICATIONS FOR PROCEDURE:  Renee [name] is a 70 year old woman, who  has had significant bilateral knee pain, right worse than left over the  last 6 months, with exam and MRI documenting meniscal tear with  chondromalacia and synovitis.  She has failed conservative care and is  now to undergo arthroscopy.  Her left knee has also been bothering her  more significantly, and we will do a cortisone injection per her  request.   DESCRIPTION OF PROCEDURES:  Renee [name] was brought to the operating  room on March 12, 2006, after a knee block was placed in the right  knee by Anesthesia.  She was placed on the operating table in supine  position.  Her right knee was examined: range of motion from 0 to 125  degrees, 1 to 2+ crepitation, knee stable to ligamentous exam, with  normal patellar tracking.  Her left knee showed 1 to 2+ crepitation, 1+  synovitis, range of motion 0 to 120  degrees and the knee was stable with  a normal ligamentous exam.  The left knee was injected with 40 mg of  Depo-Medrol and 3 mL of 0.25% Marcaine.  The right leg was prepped using  sterile DuraPrep and draped using sterile technique.  Originally,  through an anterolateral portal, the arthroscope with a pump attached  was placed, and through an anteromedial portal an arthroscopic probe was  placed.  On initial inspection of the medial compartment, the articular  cartilage showed 75% grade 3 chondromalacia, which was debrided.  Medial  meniscal tear, posteromedial and anterior horn, of which 50% was  resected back to a stable rim.  Intercondylar notch inspected.  Anterior  and posterior cruciate ligaments were normal.  Lateral compartment  inspected: 30 to 40% grade 3 chondromalacia, which was debrided.  Lateral meniscus tear, posterior and lateral horn, of which 40 to 50%  was resected back to a stable rim.  The patellofemoral joint showed 50%  grade 3 changes on the patella and femoral groove and  this was debrided.  The patella tracked normally.  Moderate synovitis in the medial and  lateral gutters was debrided; otherwise, they were free of pathology.  After this was done and it was felt that all pathology had been  satisfactorily addressed, the instruments were removed.  The portals  were closed with 3-0 nylon suture and injected with 0.25% Marcaine with  epinephrine and 4 mg of morphine.  A sterile dressing was applied, and  the patient awakened and taken to the recovery room in stable condition.   FOLLOWUP CARE:  Renee [name] is going to be followed as an outpatient  on Tylenol Extra Strength, as well as Lovenox injections due to a  previous DVT.  See her back in the office for sutures out and followup.      Robert A. Thurston Hole, M.D.  Electronically Signed     RAW/MEDQ  D:  03/12/2006  T:  03/12/2006  Job:  [id number]

## 2010-11-10 LAB — FUNGUS CULTURE W SMEAR: Fungal Smear: NONE SEEN

## 2010-11-10 LAB — AFB CULTURE WITH SMEAR (NOT AT ARMC): Acid Fast Smear: NONE SEEN

## 2011-03-14 DIAGNOSIS — E7800 Pure hypercholesterolemia, unspecified: Secondary | ICD-10-CM | POA: Diagnosis not present

## 2011-03-14 DIAGNOSIS — Z79899 Other long term (current) drug therapy: Secondary | ICD-10-CM | POA: Diagnosis not present

## 2011-03-17 DIAGNOSIS — R7309 Other abnormal glucose: Secondary | ICD-10-CM | POA: Diagnosis not present

## 2011-03-17 DIAGNOSIS — E7800 Pure hypercholesterolemia, unspecified: Secondary | ICD-10-CM | POA: Diagnosis not present

## 2011-05-02 DIAGNOSIS — E7800 Pure hypercholesterolemia, unspecified: Secondary | ICD-10-CM | POA: Diagnosis not present

## 2011-05-02 DIAGNOSIS — Z79899 Other long term (current) drug therapy: Secondary | ICD-10-CM | POA: Diagnosis not present

## 2011-05-02 DIAGNOSIS — IMO0001 Reserved for inherently not codable concepts without codable children: Secondary | ICD-10-CM | POA: Diagnosis not present

## 2011-05-12 DIAGNOSIS — M25559 Pain in unspecified hip: Secondary | ICD-10-CM | POA: Diagnosis not present

## 2011-05-12 DIAGNOSIS — M1710 Unilateral primary osteoarthritis, unspecified knee: Secondary | ICD-10-CM | POA: Diagnosis not present

## 2011-05-12 DIAGNOSIS — IMO0002 Reserved for concepts with insufficient information to code with codable children: Secondary | ICD-10-CM | POA: Diagnosis not present

## 2011-06-09 ENCOUNTER — Other Ambulatory Visit: Payer: Self-pay | Admitting: Obstetrics and Gynecology

## 2011-06-09 DIAGNOSIS — Z124 Encounter for screening for malignant neoplasm of cervix: Secondary | ICD-10-CM | POA: Diagnosis not present

## 2011-06-09 DIAGNOSIS — Z01419 Encounter for gynecological examination (general) (routine) without abnormal findings: Secondary | ICD-10-CM | POA: Diagnosis not present

## 2011-06-15 DIAGNOSIS — M25559 Pain in unspecified hip: Secondary | ICD-10-CM | POA: Diagnosis not present

## 2011-06-15 DIAGNOSIS — R262 Difficulty in walking, not elsewhere classified: Secondary | ICD-10-CM | POA: Diagnosis not present

## 2011-06-20 DIAGNOSIS — R262 Difficulty in walking, not elsewhere classified: Secondary | ICD-10-CM | POA: Diagnosis not present

## 2011-06-20 DIAGNOSIS — M25559 Pain in unspecified hip: Secondary | ICD-10-CM | POA: Diagnosis not present

## 2011-06-20 DIAGNOSIS — E7800 Pure hypercholesterolemia, unspecified: Secondary | ICD-10-CM | POA: Diagnosis not present

## 2011-06-23 DIAGNOSIS — R262 Difficulty in walking, not elsewhere classified: Secondary | ICD-10-CM | POA: Diagnosis not present

## 2011-06-23 DIAGNOSIS — M25559 Pain in unspecified hip: Secondary | ICD-10-CM | POA: Diagnosis not present

## 2011-06-28 DIAGNOSIS — M25559 Pain in unspecified hip: Secondary | ICD-10-CM | POA: Diagnosis not present

## 2011-06-28 DIAGNOSIS — R262 Difficulty in walking, not elsewhere classified: Secondary | ICD-10-CM | POA: Diagnosis not present

## 2011-06-29 DIAGNOSIS — R262 Difficulty in walking, not elsewhere classified: Secondary | ICD-10-CM | POA: Diagnosis not present

## 2011-06-29 DIAGNOSIS — M25559 Pain in unspecified hip: Secondary | ICD-10-CM | POA: Diagnosis not present

## 2011-08-01 DIAGNOSIS — IMO0001 Reserved for inherently not codable concepts without codable children: Secondary | ICD-10-CM | POA: Diagnosis not present

## 2011-08-01 DIAGNOSIS — Z79899 Other long term (current) drug therapy: Secondary | ICD-10-CM | POA: Diagnosis not present

## 2011-08-01 DIAGNOSIS — E119 Type 2 diabetes mellitus without complications: Secondary | ICD-10-CM | POA: Diagnosis not present

## 2011-08-01 DIAGNOSIS — E7800 Pure hypercholesterolemia, unspecified: Secondary | ICD-10-CM | POA: Diagnosis not present

## 2011-08-01 DIAGNOSIS — Z0000 Encounter for general adult medical examination without abnormal findings: Secondary | ICD-10-CM | POA: Diagnosis not present

## 2011-08-07 DIAGNOSIS — M1990 Unspecified osteoarthritis, unspecified site: Secondary | ICD-10-CM | POA: Diagnosis not present

## 2011-08-07 DIAGNOSIS — E7800 Pure hypercholesterolemia, unspecified: Secondary | ICD-10-CM | POA: Diagnosis not present

## 2011-08-07 DIAGNOSIS — Z0000 Encounter for general adult medical examination without abnormal findings: Secondary | ICD-10-CM | POA: Diagnosis not present

## 2011-08-07 DIAGNOSIS — R252 Cramp and spasm: Secondary | ICD-10-CM | POA: Diagnosis not present

## 2011-08-07 DIAGNOSIS — E119 Type 2 diabetes mellitus without complications: Secondary | ICD-10-CM | POA: Diagnosis not present

## 2011-08-28 DIAGNOSIS — I10 Essential (primary) hypertension: Secondary | ICD-10-CM | POA: Diagnosis not present

## 2011-08-28 DIAGNOSIS — M1990 Unspecified osteoarthritis, unspecified site: Secondary | ICD-10-CM | POA: Diagnosis not present

## 2011-08-28 DIAGNOSIS — E7800 Pure hypercholesterolemia, unspecified: Secondary | ICD-10-CM | POA: Diagnosis not present

## 2011-08-28 DIAGNOSIS — E119 Type 2 diabetes mellitus without complications: Secondary | ICD-10-CM | POA: Diagnosis not present

## 2011-09-07 DIAGNOSIS — M545 Low back pain, unspecified: Secondary | ICD-10-CM | POA: Diagnosis not present

## 2011-11-07 DIAGNOSIS — H52229 Regular astigmatism, unspecified eye: Secondary | ICD-10-CM | POA: Diagnosis not present

## 2011-11-07 DIAGNOSIS — H5200 Hypermetropia, unspecified eye: Secondary | ICD-10-CM | POA: Diagnosis not present

## 2011-11-07 DIAGNOSIS — H269 Unspecified cataract: Secondary | ICD-10-CM | POA: Diagnosis not present

## 2011-11-07 DIAGNOSIS — H524 Presbyopia: Secondary | ICD-10-CM | POA: Diagnosis not present

## 2011-11-09 DIAGNOSIS — E119 Type 2 diabetes mellitus without complications: Secondary | ICD-10-CM | POA: Diagnosis not present

## 2011-11-14 DIAGNOSIS — I10 Essential (primary) hypertension: Secondary | ICD-10-CM | POA: Diagnosis not present

## 2011-11-14 DIAGNOSIS — E7800 Pure hypercholesterolemia, unspecified: Secondary | ICD-10-CM | POA: Diagnosis not present

## 2011-11-14 DIAGNOSIS — E119 Type 2 diabetes mellitus without complications: Secondary | ICD-10-CM | POA: Diagnosis not present

## 2011-11-17 DIAGNOSIS — R21 Rash and other nonspecific skin eruption: Secondary | ICD-10-CM | POA: Diagnosis not present

## 2011-11-17 DIAGNOSIS — L821 Other seborrheic keratosis: Secondary | ICD-10-CM | POA: Diagnosis not present

## 2011-11-17 DIAGNOSIS — L719 Rosacea, unspecified: Secondary | ICD-10-CM | POA: Diagnosis not present

## 2011-12-19 DIAGNOSIS — M79609 Pain in unspecified limb: Secondary | ICD-10-CM | POA: Diagnosis not present

## 2011-12-21 DIAGNOSIS — E119 Type 2 diabetes mellitus without complications: Secondary | ICD-10-CM | POA: Diagnosis not present

## 2011-12-21 DIAGNOSIS — E7800 Pure hypercholesterolemia, unspecified: Secondary | ICD-10-CM | POA: Diagnosis not present

## 2011-12-25 DIAGNOSIS — E119 Type 2 diabetes mellitus without complications: Secondary | ICD-10-CM | POA: Diagnosis not present

## 2011-12-25 DIAGNOSIS — I10 Essential (primary) hypertension: Secondary | ICD-10-CM | POA: Diagnosis not present

## 2011-12-25 DIAGNOSIS — E7800 Pure hypercholesterolemia, unspecified: Secondary | ICD-10-CM | POA: Diagnosis not present

## 2011-12-27 DIAGNOSIS — M25579 Pain in unspecified ankle and joints of unspecified foot: Secondary | ICD-10-CM | POA: Diagnosis not present

## 2012-01-03 DIAGNOSIS — M25559 Pain in unspecified hip: Secondary | ICD-10-CM | POA: Diagnosis not present

## 2012-01-03 DIAGNOSIS — R262 Difficulty in walking, not elsewhere classified: Secondary | ICD-10-CM | POA: Diagnosis not present

## 2012-01-04 DIAGNOSIS — IMO0002 Reserved for concepts with insufficient information to code with codable children: Secondary | ICD-10-CM | POA: Diagnosis not present

## 2012-01-04 DIAGNOSIS — M1710 Unilateral primary osteoarthritis, unspecified knee: Secondary | ICD-10-CM | POA: Diagnosis not present

## 2012-01-08 DIAGNOSIS — M25579 Pain in unspecified ankle and joints of unspecified foot: Secondary | ICD-10-CM | POA: Diagnosis not present

## 2012-01-11 DIAGNOSIS — M25579 Pain in unspecified ankle and joints of unspecified foot: Secondary | ICD-10-CM | POA: Diagnosis not present

## 2012-02-09 ENCOUNTER — Telehealth: Payer: Self-pay | Admitting: Internal Medicine

## 2012-02-09 ENCOUNTER — Ambulatory Visit (INDEPENDENT_AMBULATORY_CARE_PROVIDER_SITE_OTHER): Payer: Medicare Other | Admitting: Internal Medicine

## 2012-02-09 ENCOUNTER — Encounter: Payer: Self-pay | Admitting: Internal Medicine

## 2012-02-09 ENCOUNTER — Ambulatory Visit (INDEPENDENT_AMBULATORY_CARE_PROVIDER_SITE_OTHER)
Admission: RE | Admit: 2012-02-09 | Discharge: 2012-02-09 | Disposition: A | Discharge status: Home / Self Care | Payer: Medicare Other | Source: Ambulatory Visit | Attending: Internal Medicine | Admitting: Internal Medicine

## 2012-02-09 VITALS — BP 130/70 | HR 108 | Temp 98.0°F | Ht 65.0 in | Wt 168.2 lb

## 2012-02-09 DIAGNOSIS — R079 Chest pain, unspecified: Secondary | ICD-10-CM | POA: Insufficient documentation

## 2012-02-09 DIAGNOSIS — R0989 Other specified symptoms and signs involving the circulatory and respiratory systems: Secondary | ICD-10-CM

## 2012-02-09 DIAGNOSIS — R0609 Other forms of dyspnea: Secondary | ICD-10-CM

## 2012-02-09 DIAGNOSIS — J449 Chronic obstructive pulmonary disease, unspecified: Secondary | ICD-10-CM | POA: Diagnosis not present

## 2012-02-09 DIAGNOSIS — R0600 Dyspnea, unspecified: Secondary | ICD-10-CM

## 2012-02-09 NOTE — Progress Notes (Signed)
  Subjective:    Patient ID: Renee [name], female    DOB: [birth year]    MRN: [id number]  HPI   17 yowf quit smoking 1994 no respiratory problems referred 02/09/2012  by Dr Renne Crigler for evaluation of sob.   02/09/2012 1st pulmonary eval in EMR era cc onset of doe  several years prior to OV indolent onset but not really progressive, can walk a block and do housework x 30 min but struggles and has to sit down at end.  Also pain in back and under L ant rib cage comes and goes x sev weeks daytime, crampy, no pleuritic or ex features, no radiation or change in bowel or bladder habits  No obvious daytime variabilty or assoc chronic cough or cp or chest tightness, subjective wheeze overt sinus or hb symptoms. No unusual exp hx or h/o childhood pna/ asthma or premature birth to her knowledge.   Sleeping ok without nocturnal  or early am exacerbation  of respiratory  c/o's   Also denies any obvious fluctuation of symptoms with weather or environmental changes or other aggravating or alleviating factors except as outlined above   Review of Systems  Constitutional: Negative for fever, chills and unexpected weight change.  HENT: Positive for rhinorrhea. Negative for ear pain, nosebleeds, congestion, sore throat, sneezing, trouble swallowing, dental problem, voice change, postnasal drip and sinus pressure.   Eyes: Negative for visual disturbance.  Respiratory: Positive for shortness of breath. Negative for cough and choking.   Cardiovascular: Negative for chest pain and leg swelling.  Gastrointestinal: Negative for vomiting, abdominal pain and diarrhea.  Genitourinary: Negative for difficulty urinating.  Musculoskeletal: Positive for arthralgias.  Skin: Negative for rash.  Neurological: Negative for tremors, syncope and headaches.  Hematological: Does not bruise/bleed easily.       Objective:   Physical Exam  amb wf nad freq throat clearing "after drinking coffee"  Wt Readings from Last 3  Encounters:  02/09/12 168 lb 3.2 oz (76.295 kg)  05/23/10 140 lb (63.504 kg)  05/12/10 144 lb 3.2 oz (65.409 kg)    HEENT: nl dentition, turbinates, and orophanx. Nl external ear canals without cough reflex   NECK :  without JVD/Nodes/TM/ nl carotid upstrokes bilaterally   LUNGS: no acc muscle use, clear to A and P bilaterally without cough on insp or exp maneuvers   CV:  RRR  no s3 or murmur or increase in P2, no edema   ABD:  soft and nontender with nl excursion in the supine position. No bruits or organomegaly, bowel sounds nl  MS:  warm without deformities, calf tenderness, cyanosis or clubbing  SKIN: warm and dry without lesions    NEURO:  alert, approp, no deficits    CXR  02/09/2012 : COPD without acute abnormality.            Assessment & Plan:

## 2012-02-09 NOTE — Patient Instructions (Addendum)
Try off crestor, coQ 10 and fish oil for a month and also calcium citrate instead of carbonate.  GERD (REFLUX)  is an extremely common cause of respiratory symptoms, many times with no significant heartburn at all.    It can be treated with medication, but also with lifestyle changes including avoidance of late meals, excessive alcohol, smoking cessation, and avoid fatty foods, chocolate, peppermint, colas, red wine, and acidic juices such as orange juice.  NO MINT OR MENTHOL PRODUCTS SO NO COUGH DROPS  USE SUGARLESS CANDY INSTEAD (jolley ranchers or Stover's)  NO OIL BASED VITAMINS - use powdered substitutes.  As far the pain in your left upper quadrant it is likely related to gas Treatment consists of avoiding foods that cause gas (especially beans and raw vegetables like spinach and salads)  and citrucel 1 heaping tsp twice daily with a large glass of water.  Pain should improve w/in 2 weeks and if not then consider further GI work up.       Please remember to go to the x-ray department downstairs for your tests - we will call you with the results when they are available.  Please schedule a follow up office visit in 4 weeks, sooner if needed with pfts

## 2012-02-09 NOTE — Assessment & Plan Note (Signed)
Classic subdiaphragmatic pain pattern suggests ibs:  Stereotypical, migratory with a very limited distribution of pain locations, daytime, not exacerbated by ex or coughing, worse in sitting position, associated with generalized abd bloating, not present supine due to the dome effect of the diaphragm is  canceled in that position. Frequently these patients have had multiple negative GI workups and CT scans.  Treatment consists of avoiding foods that cause gas (especially beans and raw vegetables like spinach and salads)  and citrucel 1 heaping tsp twice daily with a large glass of water.  Pain should improve w/in 2 weeks and if not then consider further GI work up.     She has other aches and pains that sound like muscle cramps in chest and back > try off crestor x one month then rechallenge

## 2012-02-09 NOTE — Assessment & Plan Note (Addendum)
-   02/09/2012  Walked RA x 3 laps @ 185 ft each stopped due to  End of study no desat  Assoc with wt gain.   When respiratory symptoms begin or become refractory well after a patient reports complete smoking cessation,  Especially when this wasn't the case while they were smoking, a red flag is raised based on the work of Dr Primitivo Gauze which states:  if you quit smoking when your best day FEV1 is still well preserved it is highly unlikely you will progress to severe disease.  That is to say, once the smoking stops,  the symptoms should not suddenly erupt or markedly worsen.  If so, the differential diagnosis should include  obesity/deconditioning,  LPR/Reflux/Aspiration syndromes,  occult CHF, or  especially side effect of medications commonly used in this population.    Try GERD diet/off fish oil and return in 4 weeks for pfts since prev h/o smoking but strongly doubt lung dz or copd here

## 2012-02-09 NOTE — Telephone Encounter (Signed)
Result Note     Call pt: Reviewed cxr and no acute change so no change in recommendations made at ov  --  I spoke with patient about results and she verbalized understanding and had no questions 

## 2012-02-14 ENCOUNTER — Encounter: Payer: Self-pay | Admitting: Internal Medicine

## 2012-02-14 DIAGNOSIS — R05 Cough: Secondary | ICD-10-CM | POA: Insufficient documentation

## 2012-02-14 DIAGNOSIS — R059 Cough, unspecified: Secondary | ICD-10-CM | POA: Insufficient documentation

## 2012-02-16 DIAGNOSIS — M1710 Unilateral primary osteoarthritis, unspecified knee: Secondary | ICD-10-CM | POA: Diagnosis not present

## 2012-02-16 DIAGNOSIS — IMO0002 Reserved for concepts with insufficient information to code with codable children: Secondary | ICD-10-CM | POA: Diagnosis not present

## 2012-02-22 DIAGNOSIS — IMO0002 Reserved for concepts with insufficient information to code with codable children: Secondary | ICD-10-CM | POA: Diagnosis not present

## 2012-02-22 DIAGNOSIS — M1710 Unilateral primary osteoarthritis, unspecified knee: Secondary | ICD-10-CM | POA: Diagnosis not present

## 2012-02-27 DIAGNOSIS — M6240 Contracture of muscle, unspecified site: Secondary | ICD-10-CM | POA: Diagnosis not present

## 2012-02-27 DIAGNOSIS — M7660 Achilles tendinitis, unspecified leg: Secondary | ICD-10-CM | POA: Diagnosis not present

## 2012-02-28 DIAGNOSIS — M1710 Unilateral primary osteoarthritis, unspecified knee: Secondary | ICD-10-CM | POA: Diagnosis not present

## 2012-02-28 DIAGNOSIS — IMO0002 Reserved for concepts with insufficient information to code with codable children: Secondary | ICD-10-CM | POA: Diagnosis not present

## 2012-03-06 ENCOUNTER — Telehealth: Payer: Self-pay | Admitting: Internal Medicine

## 2012-03-06 NOTE — Telephone Encounter (Signed)
[email or web address] Messimer, CMA  

## 2012-03-07 NOTE — Telephone Encounter (Signed)
The pt is requesting a copy of her most recent cxr report be mailed to her home. Pt's address verified and report placed in the mail.

## 2012-03-08 ENCOUNTER — Ambulatory Visit: Payer: Medicare Other | Admitting: Internal Medicine

## 2012-03-26 DIAGNOSIS — E7800 Pure hypercholesterolemia, unspecified: Secondary | ICD-10-CM | POA: Diagnosis not present

## 2012-03-26 DIAGNOSIS — E119 Type 2 diabetes mellitus without complications: Secondary | ICD-10-CM | POA: Diagnosis not present

## 2012-03-28 DIAGNOSIS — I10 Essential (primary) hypertension: Secondary | ICD-10-CM | POA: Diagnosis not present

## 2012-03-28 DIAGNOSIS — IMO0001 Reserved for inherently not codable concepts without codable children: Secondary | ICD-10-CM | POA: Diagnosis not present

## 2012-03-28 DIAGNOSIS — E7800 Pure hypercholesterolemia, unspecified: Secondary | ICD-10-CM | POA: Diagnosis not present

## 2012-03-28 DIAGNOSIS — E119 Type 2 diabetes mellitus without complications: Secondary | ICD-10-CM | POA: Diagnosis not present

## 2012-04-04 DIAGNOSIS — M1710 Unilateral primary osteoarthritis, unspecified knee: Secondary | ICD-10-CM | POA: Diagnosis not present

## 2012-04-04 DIAGNOSIS — IMO0002 Reserved for concepts with insufficient information to code with codable children: Secondary | ICD-10-CM | POA: Diagnosis not present

## 2012-04-19 DIAGNOSIS — L719 Rosacea, unspecified: Secondary | ICD-10-CM | POA: Diagnosis not present

## 2012-04-19 DIAGNOSIS — L820 Inflamed seborrheic keratosis: Secondary | ICD-10-CM | POA: Diagnosis not present

## 2012-05-14 DIAGNOSIS — M7660 Achilles tendinitis, unspecified leg: Secondary | ICD-10-CM | POA: Diagnosis not present

## 2012-05-21 DIAGNOSIS — M19079 Primary osteoarthritis, unspecified ankle and foot: Secondary | ICD-10-CM | POA: Diagnosis not present

## 2012-05-24 DIAGNOSIS — M19079 Primary osteoarthritis, unspecified ankle and foot: Secondary | ICD-10-CM | POA: Diagnosis not present

## 2012-05-30 HISTORY — PX: OTHER SURGICAL HISTORY: SHX169

## 2012-05-31 ENCOUNTER — Encounter (INDEPENDENT_AMBULATORY_CARE_PROVIDER_SITE_OTHER): Payer: Medicare Other

## 2012-05-31 DIAGNOSIS — M7989 Other specified soft tissue disorders: Secondary | ICD-10-CM

## 2012-05-31 DIAGNOSIS — Z86718 Personal history of other venous thrombosis and embolism: Secondary | ICD-10-CM | POA: Diagnosis not present

## 2012-05-31 DIAGNOSIS — M79609 Pain in unspecified limb: Secondary | ICD-10-CM | POA: Diagnosis not present

## 2012-06-03 DIAGNOSIS — M66369 Spontaneous rupture of flexor tendons, unspecified lower leg: Secondary | ICD-10-CM | POA: Diagnosis not present

## 2012-06-03 DIAGNOSIS — M7730 Calcaneal spur, unspecified foot: Secondary | ICD-10-CM | POA: Diagnosis not present

## 2012-06-03 DIAGNOSIS — M7660 Achilles tendinitis, unspecified leg: Secondary | ICD-10-CM | POA: Diagnosis not present

## 2012-06-03 DIAGNOSIS — M898X9 Other specified disorders of bone, unspecified site: Secondary | ICD-10-CM | POA: Diagnosis not present

## 2012-06-03 DIAGNOSIS — G8918 Other acute postprocedural pain: Secondary | ICD-10-CM | POA: Diagnosis not present

## 2012-06-04 ENCOUNTER — Telehealth: Payer: Self-pay | Admitting: Cardiology

## 2012-06-04 NOTE — Telephone Encounter (Signed)
Lower Venous Report Faxed to Dr. Thurston Hole

## 2012-06-25 DIAGNOSIS — E7800 Pure hypercholesterolemia, unspecified: Secondary | ICD-10-CM | POA: Diagnosis not present

## 2012-06-25 DIAGNOSIS — E119 Type 2 diabetes mellitus without complications: Secondary | ICD-10-CM | POA: Diagnosis not present

## 2012-06-28 DIAGNOSIS — I10 Essential (primary) hypertension: Secondary | ICD-10-CM | POA: Diagnosis not present

## 2012-06-28 DIAGNOSIS — E119 Type 2 diabetes mellitus without complications: Secondary | ICD-10-CM | POA: Diagnosis not present

## 2012-06-28 DIAGNOSIS — E7800 Pure hypercholesterolemia, unspecified: Secondary | ICD-10-CM | POA: Diagnosis not present

## 2012-07-23 DIAGNOSIS — M66369 Spontaneous rupture of flexor tendons, unspecified lower leg: Secondary | ICD-10-CM | POA: Diagnosis not present

## 2012-07-23 DIAGNOSIS — Z4789 Encounter for other orthopedic aftercare: Secondary | ICD-10-CM | POA: Diagnosis not present

## 2012-07-23 DIAGNOSIS — M898X9 Other specified disorders of bone, unspecified site: Secondary | ICD-10-CM | POA: Diagnosis not present

## 2012-07-25 DIAGNOSIS — M898X9 Other specified disorders of bone, unspecified site: Secondary | ICD-10-CM | POA: Diagnosis not present

## 2012-07-25 DIAGNOSIS — M66369 Spontaneous rupture of flexor tendons, unspecified lower leg: Secondary | ICD-10-CM | POA: Diagnosis not present

## 2012-07-31 DIAGNOSIS — Z006 Encounter for examination for normal comparison and control in clinical research program: Secondary | ICD-10-CM | POA: Diagnosis not present

## 2012-07-31 DIAGNOSIS — I10 Essential (primary) hypertension: Secondary | ICD-10-CM | POA: Diagnosis not present

## 2012-07-31 DIAGNOSIS — M1990 Unspecified osteoarthritis, unspecified site: Secondary | ICD-10-CM | POA: Diagnosis not present

## 2012-07-31 DIAGNOSIS — E119 Type 2 diabetes mellitus without complications: Secondary | ICD-10-CM | POA: Diagnosis not present

## 2012-07-31 DIAGNOSIS — E7800 Pure hypercholesterolemia, unspecified: Secondary | ICD-10-CM | POA: Diagnosis not present

## 2012-08-06 DIAGNOSIS — E7800 Pure hypercholesterolemia, unspecified: Secondary | ICD-10-CM | POA: Diagnosis not present

## 2012-08-06 DIAGNOSIS — I10 Essential (primary) hypertension: Secondary | ICD-10-CM | POA: Diagnosis not present

## 2012-08-06 DIAGNOSIS — E119 Type 2 diabetes mellitus without complications: Secondary | ICD-10-CM | POA: Diagnosis not present

## 2012-08-06 DIAGNOSIS — N390 Urinary tract infection, site not specified: Secondary | ICD-10-CM | POA: Diagnosis not present

## 2012-08-08 DIAGNOSIS — E7800 Pure hypercholesterolemia, unspecified: Secondary | ICD-10-CM | POA: Diagnosis not present

## 2012-08-08 DIAGNOSIS — E119 Type 2 diabetes mellitus without complications: Secondary | ICD-10-CM | POA: Diagnosis not present

## 2012-08-08 DIAGNOSIS — I10 Essential (primary) hypertension: Secondary | ICD-10-CM | POA: Diagnosis not present

## 2012-08-14 DIAGNOSIS — M1710 Unilateral primary osteoarthritis, unspecified knee: Secondary | ICD-10-CM | POA: Diagnosis not present

## 2012-09-04 ENCOUNTER — Other Ambulatory Visit: Payer: Self-pay

## 2012-09-13 DIAGNOSIS — M1710 Unilateral primary osteoarthritis, unspecified knee: Secondary | ICD-10-CM | POA: Diagnosis not present

## 2012-09-27 ENCOUNTER — Other Ambulatory Visit: Payer: Self-pay | Admitting: Orthopedic Surgery

## 2012-09-27 ENCOUNTER — Encounter (HOSPITAL_COMMUNITY): Payer: Self-pay | Admitting: Pharmacy Technician

## 2012-10-04 ENCOUNTER — Other Ambulatory Visit (HOSPITAL_COMMUNITY): Payer: Self-pay | Admitting: Orthopedic Surgery

## 2012-10-04 NOTE — Patient Instructions (Addendum)
20 Renee [name]  10/04/2012   Your procedure is scheduled on: 10-14-2012  Report to [facility] at 625 AM.  Call this number if you have problems the morning of surgery [phone]   Remember:   Do not eat food or drink liquids :After Midnight.     Take these medicines the morning of surgery with A SIP OF WATER :PERCOCET IF NEEDED                                SEE [facility] PREPARING FOR SURGERY SHEET   Do not wear jewelry, make-up.  Do not wear lotions, powders, or perfumes. You may wear deodorant.   Men may shave face and neck.  Do not bring valuables to the hospital. [facility] IS NOT RESPONSIBLE FOR VALUEABLES.  Contacts, dentures or bridgework may not be worn into surgery.  Leave suitcase in the car. After surgery it may be brought to your room.  For patients admitted to the hospital, checkout time is 11:00 AM the day of discharge.   Patients discharged the day of surgery will not be allowed to drive home.  Name and phone number of your driver:  Special Instructions: N/A   Please read over the following fact sheets that you were given: mrsa information, blood fact sheet, incentive spirometer fact sheet  Call Cain Sieve RN pre op nurse if needed [phone]    FAILURE TO FOLLOW THESE INSTRUCTIONS MAY RESULT IN THE CANCELLATION OF YOUR SURGERY.  PATIENT SIGNATURE___________________________________________  NURSE SIGNATURE_____________________________________________

## 2012-10-04 NOTE — Progress Notes (Signed)
Chest xray 2 view 02-09-2012 epic

## 2012-10-07 ENCOUNTER — Encounter (HOSPITAL_COMMUNITY): Payer: Self-pay

## 2012-10-07 ENCOUNTER — Encounter (HOSPITAL_COMMUNITY)
Admission: RE | Admit: 2012-10-07 | Discharge: 2012-10-07 | Disposition: A | Discharge status: Home / Self Care | Payer: Medicare Other | Source: Ambulatory Visit | Attending: Orthopedic Surgery | Admitting: Orthopedic Surgery

## 2012-10-07 DIAGNOSIS — M1710 Unilateral primary osteoarthritis, unspecified knee: Secondary | ICD-10-CM | POA: Diagnosis not present

## 2012-10-07 DIAGNOSIS — Z01812 Encounter for preprocedural laboratory examination: Secondary | ICD-10-CM | POA: Diagnosis not present

## 2012-10-07 HISTORY — DX: Measles without complication: B05.9

## 2012-10-07 HISTORY — DX: Other specified postprocedural states: R11.2

## 2012-10-07 HISTORY — DX: Acute embolism and thrombosis of unspecified deep veins of unspecified lower extremity: I82.409

## 2012-10-07 HISTORY — DX: Fibromyalgia: M79.7

## 2012-10-07 HISTORY — DX: Mumps without complication: B26.9

## 2012-10-07 HISTORY — DX: Other cervical disc degeneration, unspecified cervical region: M50.30

## 2012-10-07 HISTORY — DX: Gastro-esophageal reflux disease without esophagitis: K21.9

## 2012-10-07 HISTORY — DX: Diverticulosis of intestine, part unspecified, without perforation or abscess without bleeding: K57.90

## 2012-10-07 HISTORY — DX: Other specified postprocedural states: Z98.890

## 2012-10-07 HISTORY — DX: Chronic obstructive pulmonary disease, unspecified: J44.9

## 2012-10-07 HISTORY — DX: Unspecified osteoarthritis, unspecified site: M19.90

## 2012-10-07 LAB — CBC
HCT: 42 % (ref 36.0–46.0)
Hemoglobin: 14.4 g/dL (ref 12.0–15.0)
MCH: 32.3 pg (ref 26.0–34.0)
MCHC: 34.3 g/dL (ref 30.0–36.0)
MCV: 94.2 fL (ref 78.0–100.0)
Platelets: 276 10*3/uL (ref 150–400)
RBC: 4.46 MIL/uL (ref 3.87–5.11)
RDW: 13 % (ref 11.5–15.5)
WBC: 6.8 10*3/uL (ref 4.0–10.5)

## 2012-10-07 LAB — COMPREHENSIVE METABOLIC PANEL
ALT: 8 U/L (ref 0–35)
AST: 16 U/L (ref 0–37)
Albumin: 3.9 g/dL (ref 3.5–5.2)
Alkaline Phosphatase: 96 U/L (ref 39–117)
BUN: 10 mg/dL (ref 6–23)
CO2: 31 mEq/L (ref 19–32)
Calcium: 9.7 mg/dL (ref 8.4–10.5)
Chloride: 99 mEq/L (ref 96–112)
Creatinine, Ser: 0.71 mg/dL (ref 0.50–1.10)
GFR calc Af Amer: >90 mL/min (ref >90)
GFR calc non Af Amer: 84 mL/min — ABNORMAL LOW (ref >90)
Glucose, Bld: 118 mg/dL — ABNORMAL HIGH (ref 70–99)
Potassium: 4.5 mEq/L (ref 3.5–5.1)
Sodium: 138 mEq/L (ref 135–145)
Total Bilirubin: 0.5 mg/dL (ref 0.3–1.2)
Total Protein: 7.3 g/dL (ref 6.0–8.3)

## 2012-10-07 LAB — URINALYSIS, ROUTINE W REFLEX MICROSCOPIC
Bilirubin Urine: NEGATIVE
Glucose, UA: NEGATIVE mg/dL
Hgb urine dipstick: NEGATIVE
Ketones, ur: NEGATIVE mg/dL
Nitrite: NEGATIVE
Protein, ur: NEGATIVE mg/dL
Specific Gravity, Urine: 1.032 — ABNORMAL HIGH (ref 1.005–1.030)
Urobilinogen, UA: 0.2 mg/dL (ref 0.0–1.0)
pH: 5.5 (ref 5.0–8.0)

## 2012-10-07 LAB — URINE MICROSCOPIC-ADD ON

## 2012-10-07 LAB — PROTIME-INR
INR: 0.94 (ref 0.00–1.49)
Prothrombin Time: 12.4 seconds (ref 11.6–15.2)

## 2012-10-07 LAB — APTT: aPTT: 32 seconds (ref 24–37)

## 2012-10-07 LAB — SURGICAL PCR SCREEN
MRSA, PCR: NEGATIVE
Staphylococcus aureus: NEGATIVE

## 2012-10-07 LAB — ABO/RH: ABO/RH(D): B NEG

## 2012-10-07 NOTE — Progress Notes (Signed)
CHEST XRAY 02-09-2012 EPIC EKG 07-31-12 DR [facility] ON CHART

## 2012-10-07 NOTE — Progress Notes (Signed)
Micro ua results faxed by epic to dr aluisio  

## 2012-10-08 NOTE — Progress Notes (Signed)
Fax received per [facility], no action on urine micro results, fax placed on chart

## 2012-10-13 ENCOUNTER — Other Ambulatory Visit: Payer: Self-pay | Admitting: Orthopedic Surgery

## 2012-10-13 NOTE — H&P (Signed)
Renee [name]  DOB: 10/14/1940 Married / Language: English / Race: White Female  Date of Admission:  10/14/2012  Chief Complaint:  Left Knee Pain  History of Present Illness The patient is a 72 year old female who comes in for a preoperative History and Physical. The patient is scheduled for a left total knee arthroplasty to be performed by Dr. Frank V. Aluisio, MD at [facility] on 10/14/2012. The patient is a 71 year old female who presents for follow up of their knee. The patient is being followed for their left knee pain and osteoarthritis. They are now month(s) out from Euflexxa series. Symptoms reported today include: pain, aching, stiffness, pain with weightbearing, difficulty ambulating and difficulty arising from chair. The patient feels that they are doing poorly and report their pain level to be moderate to severe. Current treatment includes: modified weightbearing (with the use of one crutch). Note for "Follow-up Knee": She had some surgery on her left heel and was in a cam walker. She said once the boot was taken off, she could not walk. She is ready to proceed with the total knee replacement. They have been treated conservatively in the past for the above stated problem and despite conservative measures, they continue to have progressive pain and severe functional limitations and dysfunction. They have failed non-operative management including home exercise, medications, and injections. It is felt that they would benefit from undergoing total joint replacement. Risks and benefits of the procedure have been discussed with the patient and they elect to proceed with surgery. There are no active contraindications to surgery such as ongoing infection or rapidly progressive neurological disease.   Problem List Primary osteoarthritis of both knees   Allergies Ibuprofen *ANALGESICS - ANTI-INFLAMMATORY*. blisters Aspirin *ANALGESICS - NonNarcotic* Codeine  Derivatives. Itching. Mood Changes (Vicodin) Lisinopril *ANTIHYPERTENSIVES*. Cough.   Family History No pertinent family history   Social History Illicit drug use. no Exercise. Exercises rarely; does running / walking Marital status. married Living situation. live with spouse Drug/Alcohol Rehab (Previously). no Children. 2 Alcohol use. current drinker; drinks wine; only occasionally per week Drug/Alcohol Rehab (Currently). no Current work status. retired Pain Contract. no Number of flights of stairs before winded. greater than 5 Tobacco / smoke exposure. no Tobacco use. former smoker; smoke(d) less than 1/2 pack(s) per day   Medication History Losartan Potassium-HCTZ (100-25MG Tablet, Oral) Active. Crestor (5MG Tablet, Oral) Active. Zetia (10MG Tablet, Oral) Active. Cymbalta (60MG Capsule DR Part, Oral) Active. Aspirin EC (81MG Tablet DR, Oral) Active. Co Q 10 ( Oral) Specific dose unknown - Active. Multivitamin ( Oral) Active. Vitamin C ( Oral) Active. Vitamin D3 Complete ( Oral) Active. Fish Oil Burp-Less (1200MG Capsule, Oral) Active.  Past Surgical History Appendectomy Foot Surgery. right Colon Polyp Removal - Colonoscopy Tonsillectomy Mammoplasty; Reduction. bilateral Hysterectomy. Date: 1976. partial (non-cancerous) Spinal Surgery. Date: 1976. Mastectomy. Date: 1994. bilateral Arthroscopic Knee Surgery - Left. Date: 2002. Postop complication - DVT Arthroscopic Knee Surgery - Right. Date: 2008.   Medical History Hypercholesterolemia Fibromyalgia Osteoporosis Osteoarthritis Breast Cancer DVT. Postop Left Knee Scope - 2002 COPD Gastroesophageal Reflux Disease Diverticulosis Urinary Tract Infection Transfusion history. 1948 Measles Mumps   Review of Systems General:Not Present- Chills, Fever, Night Sweats, Fatigue, Weight Gain, Weight Loss and Memory Loss. Skin:Not Present- Hives, Itching, Rash, Eczema and  Lesions. HEENT:Not Present- Tinnitus, Headache, Double Vision, Visual Loss, Hearing Loss and Dentures. Respiratory:Not Present- Shortness of breath with exertion, Shortness of breath at rest, Allergies, Coughing up blood and Chronic   Cough. Cardiovascular:Not Present- Chest Pain, Racing/skipping heartbeats, Difficulty Breathing Lying Down, Murmur, Swelling and Palpitations. Gastrointestinal:Present- Heartburn. Not Present- Bloody Stool, Abdominal Pain, Vomiting, Nausea, Constipation, Diarrhea, Difficulty Swallowing, Jaundice and Loss of appetitie. Female Genitourinary:Present- Urinating at Night. Not Present- Blood in Urine, Urinary frequency, Weak urinary stream, Discharge, Flank Pain, Incontinence, Painful Urination, Urgency and Urinary Retention. Musculoskeletal:Present- Muscle Weakness, Joint Swelling, Joint Pain, Morning Stiffness and Spasms. Not Present- Muscle Pain and Back Pain. Neurological:Not Present- Tremor, Dizziness, Blackout spells, Paralysis, Difficulty with balance and Weakness. Psychiatric:Not Present- Insomnia.   Vitals Pulse: 108 (Regular) Resp.: 16 (Unlabored) BP: 128/76 (Sitting, Right Arm, Standard)    Physical Exam The physical exam findings are as follows:  Note: Patient is a 72 year old female with continued knee pain.   General Mental Status - Alert, cooperative and good historian. General Appearance- pleasant. Not in acute distress. Orientation- Oriented X3. Build & Nutrition- Well nourished and Well developed.   Head and Neck Head- normocephalic, atraumatic . Neck Global Assessment- supple. no bruit auscultated on the right and no bruit auscultated on the left.   Eye Pupil- Bilateral- Regular and Round. Motion- Bilateral- EOMI.   Chest and Lung Exam Auscultation: Breath sounds:- clear at anterior chest wall and - clear at posterior chest wall. Adventitious sounds:- No Adventitious  sounds.   Cardiovascular Auscultation:Rhythm- Regular rate and rhythm. Heart Sounds- S1 WNL and S2 WNL. Murmurs & Other Heart Sounds:Auscultation of the heart reveals - No Murmurs.   Abdomen Palpation/Percussion:Tenderness- Abdomen is non-tender to palpation. Rigidity (guarding)- Abdomen is soft. Auscultation:Auscultation of the abdomen reveals - Bowel sounds normal.   Female Genitourinary Not done, not pertinent to present illness  Musculoskeletal She is a well developed female. She is alert and oriented. No apparent distress. Both knees show a various deformity. There is no effusion in either knee. Range is about 5-120 on each side with no instability.  Assessment & Plan Primary osteoarthritis of both knees (715.16) Impression: Left Knee Current Plans l Pt Education - How to access health information online: discussed with patient and provided information.  Note: Plan is for a Left Total Knee Replacement by Dr. Aluisio.  Plan is to go home.  PCP - Dr. Pharr  The patient will not receive TXA (tranexamic acid) due to: History of DVT postop knee scope - 2002  Time spent ~ 20 minutes  Signed electronically by Alexzandrew L Perkins, III PA-C 

## 2012-10-14 ENCOUNTER — Encounter (HOSPITAL_COMMUNITY)
Admission: RE | Disposition: A | Discharge status: Home Health | Payer: Self-pay | Source: Ambulatory Visit | Attending: Orthopedic Surgery

## 2012-10-14 ENCOUNTER — Encounter (HOSPITAL_COMMUNITY): Payer: Self-pay | Admitting: *Deleted

## 2012-10-14 ENCOUNTER — Inpatient Hospital Stay (HOSPITAL_COMMUNITY): Payer: Medicare Other | Admitting: Certified Registered Nurse Anesthetist

## 2012-10-14 ENCOUNTER — Encounter (HOSPITAL_COMMUNITY): Payer: Self-pay | Admitting: Certified Registered Nurse Anesthetist

## 2012-10-14 ENCOUNTER — Inpatient Hospital Stay (HOSPITAL_COMMUNITY)
Admission: RE | Admit: 2012-10-14 | Discharge: 2012-10-16 | DRG: 470 | Disposition: A | Discharge status: Home Health | Payer: Medicare Other | Source: Ambulatory Visit | Attending: Orthopedic Surgery | Admitting: Orthopedic Surgery

## 2012-10-14 DIAGNOSIS — E785 Hyperlipidemia, unspecified: Secondary | ICD-10-CM | POA: Diagnosis present

## 2012-10-14 DIAGNOSIS — R110 Nausea: Secondary | ICD-10-CM | POA: Diagnosis not present

## 2012-10-14 DIAGNOSIS — M810 Age-related osteoporosis without current pathological fracture: Secondary | ICD-10-CM | POA: Diagnosis present

## 2012-10-14 DIAGNOSIS — J449 Chronic obstructive pulmonary disease, unspecified: Secondary | ICD-10-CM | POA: Diagnosis not present

## 2012-10-14 DIAGNOSIS — D62 Acute posthemorrhagic anemia: Secondary | ICD-10-CM | POA: Diagnosis not present

## 2012-10-14 DIAGNOSIS — Z853 Personal history of malignant neoplasm of breast: Secondary | ICD-10-CM

## 2012-10-14 DIAGNOSIS — M1710 Unilateral primary osteoarthritis, unspecified knee: Secondary | ICD-10-CM | POA: Diagnosis not present

## 2012-10-14 DIAGNOSIS — I10 Essential (primary) hypertension: Secondary | ICD-10-CM | POA: Diagnosis present

## 2012-10-14 DIAGNOSIS — Z6829 Body mass index (BMI) 29.0-29.9, adult: Secondary | ICD-10-CM | POA: Diagnosis not present

## 2012-10-14 DIAGNOSIS — Z86718 Personal history of other venous thrombosis and embolism: Secondary | ICD-10-CM | POA: Diagnosis not present

## 2012-10-14 DIAGNOSIS — K219 Gastro-esophageal reflux disease without esophagitis: Secondary | ICD-10-CM | POA: Diagnosis present

## 2012-10-14 DIAGNOSIS — Z96652 Presence of left artificial knee joint: Secondary | ICD-10-CM

## 2012-10-14 DIAGNOSIS — M25569 Pain in unspecified knee: Secondary | ICD-10-CM | POA: Diagnosis not present

## 2012-10-14 DIAGNOSIS — J4489 Other specified chronic obstructive pulmonary disease: Secondary | ICD-10-CM | POA: Diagnosis present

## 2012-10-14 DIAGNOSIS — E7800 Pure hypercholesterolemia, unspecified: Secondary | ICD-10-CM | POA: Diagnosis present

## 2012-10-14 DIAGNOSIS — Z87891 Personal history of nicotine dependence: Secondary | ICD-10-CM | POA: Diagnosis not present

## 2012-10-14 DIAGNOSIS — Z01812 Encounter for preprocedural laboratory examination: Secondary | ICD-10-CM | POA: Diagnosis not present

## 2012-10-14 DIAGNOSIS — IMO0001 Reserved for inherently not codable concepts without codable children: Secondary | ICD-10-CM | POA: Diagnosis not present

## 2012-10-14 DIAGNOSIS — IMO0002 Reserved for concepts with insufficient information to code with codable children: Secondary | ICD-10-CM | POA: Diagnosis not present

## 2012-10-14 HISTORY — PX: TOTAL KNEE ARTHROPLASTY: SHX125

## 2012-10-14 LAB — TYPE AND SCREEN
ABO/RH(D): B NEG
Antibody Screen: NEGATIVE

## 2012-10-14 SURGERY — ARTHROPLASTY, KNEE, TOTAL
Anesthesia: Spinal | Site: Knee | Laterality: Left | Wound class: Clean

## 2012-10-14 MED ORDER — HYDROCHLOROTHIAZIDE 25 MG PO TABS
25.0000 mg | ORAL_TABLET | Freq: Every day | ORAL | Status: DC
  Administered 2012-10-15 – 2012-10-16 (×2): 25 mg via ORAL
  Filled 2012-10-14 (×2): qty 1

## 2012-10-14 MED ORDER — DEXAMETHASONE 6 MG PO TABS
10.0000 mg | ORAL_TABLET | Freq: Every day | ORAL | Status: AC
  Filled 2012-10-14: qty 1

## 2012-10-14 MED ORDER — MENTHOL 3 MG MT LOZG: 1.0000 | LOZENGE | OROMUCOSAL | Status: DC | PRN

## 2012-10-14 MED ORDER — METHOCARBAMOL 100 MG/ML IJ SOLN
500.0000 mg | Freq: Four times a day (QID) | INTRAVENOUS | Status: DC | PRN
  Administered 2012-10-14 (×2): 500 mg via INTRAVENOUS
  Filled 2012-10-14 (×2): qty 5

## 2012-10-14 MED ORDER — METOCLOPRAMIDE HCL 5 MG/ML IJ SOLN: 5.0000 mg | Freq: Three times a day (TID) | INTRAMUSCULAR | Status: DC | PRN

## 2012-10-14 MED ORDER — ACETAMINOPHEN 500 MG PO TABS
1000.0000 mg | ORAL_TABLET | Freq: Four times a day (QID) | ORAL | Status: AC
  Administered 2012-10-14 – 2012-10-15 (×4): 1000 mg via ORAL
  Filled 2012-10-14 (×6): qty 2

## 2012-10-14 MED ORDER — ONDANSETRON HCL 4 MG PO TABS
4.0000 mg | ORAL_TABLET | Freq: Four times a day (QID) | ORAL | Status: DC | PRN
  Administered 2012-10-15 – 2012-10-16 (×2): 4 mg via ORAL
  Filled 2012-10-14 (×2): qty 1

## 2012-10-14 MED ORDER — LOSARTAN POTASSIUM-HCTZ 100-25 MG PO TABS: 0.5000 | ORAL_TABLET | Freq: Every morning | ORAL | Status: DC

## 2012-10-14 MED ORDER — BUPIVACAINE HCL (PF) 0.25 % IJ SOLN
INTRAMUSCULAR | Status: DC | PRN
  Administered 2012-10-14: 20 mL

## 2012-10-14 MED ORDER — BUPIVACAINE LIPOSOME 1.3 % IJ SUSP
20.0000 mL | Freq: Once | INTRAMUSCULAR | Status: DC
  Filled 2012-10-14: qty 20

## 2012-10-14 MED ORDER — PHENYLEPHRINE HCL 10 MG/ML IJ SOLN
INTRAMUSCULAR | Status: DC | PRN
  Administered 2012-10-14 (×4): 80 ug via INTRAVENOUS

## 2012-10-14 MED ORDER — CEFAZOLIN SODIUM-DEXTROSE 2-3 GM-% IV SOLR
2.0000 g | INTRAVENOUS | Status: AC
  Administered 2012-10-14: 2 g via INTRAVENOUS

## 2012-10-14 MED ORDER — ACETAMINOPHEN 500 MG PO TABS
1000.0000 mg | ORAL_TABLET | Freq: Once | ORAL | Status: AC
  Administered 2012-10-14: 1000 mg via ORAL
  Filled 2012-10-14: qty 2

## 2012-10-14 MED ORDER — LOSARTAN POTASSIUM 50 MG PO TABS
100.0000 mg | ORAL_TABLET | Freq: Every day | ORAL | Status: DC
  Administered 2012-10-15 – 2012-10-16 (×2): 100 mg via ORAL
  Filled 2012-10-14 (×2): qty 2

## 2012-10-14 MED ORDER — METOCLOPRAMIDE HCL 10 MG PO TABS
5.0000 mg | ORAL_TABLET | Freq: Three times a day (TID) | ORAL | Status: DC | PRN
  Administered 2012-10-16: 10 mg via ORAL
  Filled 2012-10-14: qty 1

## 2012-10-14 MED ORDER — FLEET ENEMA 7-19 GM/118ML RE ENEM: 1.0000 | ENEMA | Freq: Once | RECTAL | Status: AC | PRN

## 2012-10-14 MED ORDER — CEFAZOLIN SODIUM 1-5 GM-% IV SOLN
1.0000 g | Freq: Four times a day (QID) | INTRAVENOUS | Status: AC
  Administered 2012-10-14 (×2): 1 g via INTRAVENOUS
  Filled 2012-10-14 (×2): qty 50

## 2012-10-14 MED ORDER — DEXAMETHASONE SODIUM PHOSPHATE 10 MG/ML IJ SOLN
10.0000 mg | Freq: Every day | INTRAMUSCULAR | Status: AC
  Administered 2012-10-15: 10 mg via INTRAVENOUS
  Filled 2012-10-14: qty 1

## 2012-10-14 MED ORDER — ONDANSETRON HCL 4 MG/2ML IJ SOLN
4.0000 mg | Freq: Four times a day (QID) | INTRAMUSCULAR | Status: DC | PRN
  Administered 2012-10-15: 4 mg via INTRAVENOUS
  Filled 2012-10-14 (×2): qty 2

## 2012-10-14 MED ORDER — MIDAZOLAM HCL 5 MG/5ML IJ SOLN
INTRAMUSCULAR | Status: DC | PRN
  Administered 2012-10-14: 2 mg via INTRAVENOUS

## 2012-10-14 MED ORDER — SODIUM CHLORIDE 0.9 % IJ SOLN
INTRAMUSCULAR | Status: AC
  Filled 2012-10-14: qty 50

## 2012-10-14 MED ORDER — MORPHINE SULFATE 2 MG/ML IJ SOLN
1.0000 mg | INTRAMUSCULAR | Status: DC | PRN
  Administered 2012-10-14 (×5): 2 mg via INTRAVENOUS
  Filled 2012-10-14 (×6): qty 1

## 2012-10-14 MED ORDER — OXYCODONE HCL 5 MG PO TABS
5.0000 mg | ORAL_TABLET | ORAL | Status: DC | PRN
  Administered 2012-10-14 – 2012-10-15 (×4): 10 mg via ORAL
  Administered 2012-10-15 (×2): 5 mg via ORAL
  Administered 2012-10-15: 10 mg via ORAL
  Administered 2012-10-16 (×3): 5 mg via ORAL
  Filled 2012-10-14 (×2): qty 2
  Filled 2012-10-14 (×2): qty 1
  Filled 2012-10-14 (×2): qty 2
  Filled 2012-10-14 (×2): qty 1
  Filled 2012-10-14 (×2): qty 2

## 2012-10-14 MED ORDER — 0.9 % SODIUM CHLORIDE (POUR BTL) OPTIME
TOPICAL | Status: DC | PRN
  Administered 2012-10-14: 1000 mL

## 2012-10-14 MED ORDER — BISACODYL 10 MG RE SUPP: 10.0000 mg | Freq: Every day | RECTAL | Status: DC | PRN

## 2012-10-14 MED ORDER — METHOCARBAMOL 500 MG PO TABS
500.0000 mg | ORAL_TABLET | Freq: Four times a day (QID) | ORAL | Status: DC | PRN
  Administered 2012-10-14 – 2012-10-16 (×4): 500 mg via ORAL
  Filled 2012-10-14 (×4): qty 1

## 2012-10-14 MED ORDER — SODIUM CHLORIDE 0.9 % IV SOLN: INTRAVENOUS | Status: DC

## 2012-10-14 MED ORDER — FENTANYL CITRATE 0.05 MG/ML IJ SOLN
INTRAMUSCULAR | Status: DC | PRN
  Administered 2012-10-14: 100 ug via INTRAVENOUS

## 2012-10-14 MED ORDER — RIVAROXABAN 10 MG PO TABS
10.0000 mg | ORAL_TABLET | Freq: Every day | ORAL | Status: DC
  Administered 2012-10-15 – 2012-10-16 (×2): 10 mg via ORAL
  Filled 2012-10-14 (×3): qty 1

## 2012-10-14 MED ORDER — PHENOL 1.4 % MT LIQD: 1.0000 | OROMUCOSAL | Status: DC | PRN

## 2012-10-14 MED ORDER — SODIUM CHLORIDE 0.9 % IJ SOLN
INTRAMUSCULAR | Status: DC | PRN
  Administered 2012-10-14: 10:00:00

## 2012-10-14 MED ORDER — BUPIVACAINE IN DEXTROSE 0.75-8.25 % IT SOLN
INTRATHECAL | Status: DC | PRN
  Administered 2012-10-14: 2 mL via INTRATHECAL

## 2012-10-14 MED ORDER — EZETIMIBE 10 MG PO TABS
10.0000 mg | ORAL_TABLET | Freq: Every evening | ORAL | Status: DC
  Administered 2012-10-14 – 2012-10-15 (×2): 10 mg via ORAL
  Filled 2012-10-14 (×3): qty 1

## 2012-10-14 MED ORDER — STERILE WATER FOR IRRIGATION IR SOLN
Status: DC | PRN
  Administered 2012-10-14: 3000 mL

## 2012-10-14 MED ORDER — BUPIVACAINE HCL (PF) 0.25 % IJ SOLN
INTRAMUSCULAR | Status: AC
  Filled 2012-10-14: qty 30

## 2012-10-14 MED ORDER — DULOXETINE HCL 60 MG PO CPEP
60.0000 mg | ORAL_CAPSULE | Freq: Every evening | ORAL | Status: DC
  Administered 2012-10-14 – 2012-10-15 (×2): 60 mg via ORAL
  Filled 2012-10-14 (×3): qty 1

## 2012-10-14 MED ORDER — SODIUM CHLORIDE 0.9 % IR SOLN
Status: DC | PRN
  Administered 2012-10-14: 1000 mL

## 2012-10-14 MED ORDER — DEXAMETHASONE SODIUM PHOSPHATE 10 MG/ML IJ SOLN
10.0000 mg | Freq: Once | INTRAMUSCULAR | Status: AC
  Administered 2012-10-14: 10 mg via INTRAVENOUS

## 2012-10-14 MED ORDER — POLYETHYLENE GLYCOL 3350 17 G PO PACK: 17.0000 g | PACK | Freq: Every day | ORAL | Status: DC | PRN

## 2012-10-14 MED ORDER — DIPHENHYDRAMINE HCL 12.5 MG/5ML PO ELIX: 12.5000 mg | ORAL_SOLUTION | ORAL | Status: DC | PRN

## 2012-10-14 MED ORDER — HYDROMORPHONE HCL PF 1 MG/ML IJ SOLN: 0.2500 mg | INTRAMUSCULAR | Status: DC | PRN

## 2012-10-14 MED ORDER — DOCUSATE SODIUM 100 MG PO CAPS
100.0000 mg | ORAL_CAPSULE | Freq: Two times a day (BID) | ORAL | Status: DC
  Administered 2012-10-14 – 2012-10-16 (×4): 100 mg via ORAL

## 2012-10-14 MED ORDER — PROPOFOL INFUSION 10 MG/ML OPTIME
INTRAVENOUS | Status: DC | PRN
  Administered 2012-10-14: 100 ug/kg/min via INTRAVENOUS

## 2012-10-14 MED ORDER — SODIUM CHLORIDE 0.9 % IV SOLN
INTRAVENOUS | Status: DC
  Administered 2012-10-14 – 2012-10-15 (×2): via INTRAVENOUS

## 2012-10-14 MED ORDER — CEFAZOLIN SODIUM-DEXTROSE 2-3 GM-% IV SOLR
INTRAVENOUS | Status: AC
  Filled 2012-10-14: qty 50

## 2012-10-14 MED ORDER — LACTATED RINGERS IV SOLN
INTRAVENOUS | Status: DC
  Administered 2012-10-14: 10:00:00 via INTRAVENOUS
  Administered 2012-10-14: 1000 mL via INTRAVENOUS
  Administered 2012-10-14: 10:00:00 via INTRAVENOUS

## 2012-10-14 MED ORDER — PROMETHAZINE HCL 25 MG/ML IJ SOLN: 6.2500 mg | INTRAMUSCULAR | Status: DC | PRN

## 2012-10-14 SURGICAL SUPPLY — 58 items
BAG SPEC THK2 15X12 ZIP CLS (MISCELLANEOUS) ×1
BAG ZIPLOCK 12X15 (MISCELLANEOUS) ×2 IMPLANT
BANDAGE ELASTIC 6 VELCRO ST LF (GAUZE/BANDAGES/DRESSINGS) ×2 IMPLANT
BANDAGE ESMARK 6X9 LF (GAUZE/BANDAGES/DRESSINGS) ×1 IMPLANT
BLADE SAG 18X100X1.27 (BLADE) ×2 IMPLANT
BLADE SAW SGTL 11.0X1.19X90.0M (BLADE) ×2 IMPLANT
BNDG CMPR 9X6 STRL LF SNTH (GAUZE/BANDAGES/DRESSINGS) ×1
BNDG ESMARK 6X9 LF (GAUZE/BANDAGES/DRESSINGS) ×2
BOWL SMART MIX CTS (DISPOSABLE) ×2 IMPLANT
CAPT RP KNEE ×1 IMPLANT
CEMENT HV SMART SET (Cement) ×4 IMPLANT
CLOTH BEACON ORANGE TIMEOUT ST (SAFETY) ×2 IMPLANT
CUFF TOURN SGL QUICK 34 (TOURNIQUET CUFF) ×2
CUFF TRNQT CYL 34X4X40X1 (TOURNIQUET CUFF) ×1 IMPLANT
DECANTER SPIKE VIAL GLASS SM (MISCELLANEOUS) ×2 IMPLANT
DRAPE EXTREMITY T 121X128X90 (DRAPE) ×2 IMPLANT
DRAPE POUCH INSTRU U-SHP 10X18 (DRAPES) ×2 IMPLANT
DRAPE U-SHAPE 47X51 STRL (DRAPES) ×2 IMPLANT
DRSG ADAPTIC 3X8 NADH LF (GAUZE/BANDAGES/DRESSINGS) ×2 IMPLANT
DRSG PAD ABDOMINAL 8X10 ST (GAUZE/BANDAGES/DRESSINGS) ×2 IMPLANT
DURAPREP 26ML APPLICATOR (WOUND CARE) ×2 IMPLANT
ELECT REM PT RETURN 9FT ADLT (ELECTROSURGICAL) ×2
ELECTRODE REM PT RTRN 9FT ADLT (ELECTROSURGICAL) ×1 IMPLANT
EVACUATOR 1/8 PVC DRAIN (DRAIN) ×2 IMPLANT
FACESHIELD LNG OPTICON STERILE (SAFETY) ×10 IMPLANT
GLOVE BIO SURGEON STRL SZ7.5 (GLOVE) IMPLANT
GLOVE BIO SURGEON STRL SZ8 (GLOVE) ×2 IMPLANT
GLOVE BIOGEL PI IND STRL 8 (GLOVE) ×2 IMPLANT
GLOVE BIOGEL PI INDICATOR 8 (GLOVE) ×2
GLOVE SURG SS PI 6.5 STRL IVOR (GLOVE) IMPLANT
GOWN PREVENTION PLUS LG XLONG (DISPOSABLE) ×2 IMPLANT
GOWN STRL REIN XL XLG (GOWN DISPOSABLE) IMPLANT
HANDPIECE INTERPULSE COAX TIP (DISPOSABLE) ×2
IMMOBILIZER KNEE 20 (SOFTGOODS) ×2
IMMOBILIZER KNEE 20 THIGH 36 (SOFTGOODS) ×1 IMPLANT
KIT BASIN OR (CUSTOM PROCEDURE TRAY) ×2 IMPLANT
MANIFOLD NEPTUNE II (INSTRUMENTS) ×2 IMPLANT
NDL SAFETY ECLIPSE 18X1.5 (NEEDLE) ×2 IMPLANT
NEEDLE HYPO 18GX1.5 SHARP (NEEDLE) ×4
NS IRRIG 1000ML POUR BTL (IV SOLUTION) ×2 IMPLANT
PACK TOTAL JOINT (CUSTOM PROCEDURE TRAY) ×2 IMPLANT
PADDING CAST COTTON 6X4 STRL (CAST SUPPLIES) ×5 IMPLANT
POSITIONER SURGICAL ARM (MISCELLANEOUS) ×2 IMPLANT
SET HNDPC FAN SPRY TIP SCT (DISPOSABLE) ×1 IMPLANT
SPONGE GAUZE 4X4 12PLY (GAUZE/BANDAGES/DRESSINGS) ×2 IMPLANT
STRIP CLOSURE SKIN 1/2X4 (GAUZE/BANDAGES/DRESSINGS) ×3 IMPLANT
SUCTION FRAZIER 12FR DISP (SUCTIONS) ×2 IMPLANT
SUT MNCRL AB 4-0 PS2 18 (SUTURE) ×2 IMPLANT
SUT VIC AB 2-0 CT1 27 (SUTURE) ×6
SUT VIC AB 2-0 CT1 TAPERPNT 27 (SUTURE) ×3 IMPLANT
SUT VLOC 180 0 24IN GS25 (SUTURE) ×2 IMPLANT
SYR 20CC LL (SYRINGE) ×2 IMPLANT
SYR 50ML LL SCALE MARK (SYRINGE) ×2 IMPLANT
TAPE STRIPS DRAPE STRL (GAUZE/BANDAGES/DRESSINGS) ×1 IMPLANT
TOWEL OR 17X26 10 PK STRL BLUE (TOWEL DISPOSABLE) ×4 IMPLANT
TRAY FOLEY CATH 14FRSI W/METER (CATHETERS) ×2 IMPLANT
WATER STERILE IRR 1500ML POUR (IV SOLUTION) ×2 IMPLANT
WRAP KNEE MAXI GEL POST OP (GAUZE/BANDAGES/DRESSINGS) ×2 IMPLANT

## 2012-10-14 NOTE — Transfer of Care (Signed)
Immediate Anesthesia Transfer of Care Note  Patient: Renee [name]  Procedure(s) Performed: Procedure(s): LEFT TOTAL KNEE ARTHROPLASTY (Left)  Patient Location: PACU  Anesthesia Type:Spinal  Level of Consciousness: awake, alert , oriented and patient cooperative  Airway & Oxygen Therapy: Patient Spontanous Breathing and Patient connected to face mask oxygen  Post-op Assessment: Report given to PACU RN, Post -op Vital signs reviewed and stable and SAB level T12  Post vital signs: Reviewed and stable  Complications: No apparent anesthesia complications

## 2012-10-14 NOTE — Progress Notes (Signed)
Utilization review completed.  

## 2012-10-14 NOTE — Interval H&P Note (Signed)
History and Physical Interval Note:  10/14/2012 7:08 AM  Renee [name]  has presented today for surgery, with the diagnosis of OA OF LEFT KNEE  The various methods of treatment have been discussed with the patient and family. After consideration of risks, benefits and other options for treatment, the patient has consented to  Procedure(s): LEFT TOTAL KNEE ARTHROPLASTY (Left) as a surgical intervention .  The patient's history has been reviewed, patient examined, no change in status, stable for surgery.  I have reviewed the patient's chart and labs.  Questions were answered to the patient's satisfaction.     Loanne Drilling

## 2012-10-14 NOTE — Anesthesia Preprocedure Evaluation (Addendum)
Anesthesia Evaluation  Patient identified by MRN, date of birth, ID band Patient awake    Reviewed: Allergy & Precautions, H&P , NPO status , Patient's Chart, lab work & pertinent test results  History of Anesthesia Complications (+) PONV  Airway Mallampati: II TM Distance: >3 FB Neck ROM: Full    Dental no notable dental hx.    Pulmonary shortness of breath, COPD Pulmonology note 02-09-12 reviewed. breath sounds clear to auscultation  Pulmonary exam normal       Cardiovascular hypertension, Pt. on medications negative cardio ROS  Rhythm:Regular Rate:Normal     Neuro/Psych  Neuromuscular disease negative psych ROS   GI/Hepatic Neg liver ROS, GERD-  ,  Endo/Other  negative endocrine ROS  Renal/GU negative Renal ROS  negative genitourinary   Musculoskeletal  (+) Fibromyalgia -  Abdominal   Peds negative pediatric ROS (+)  Hematology negative hematology ROS (+)   Anesthesia Other Findings   Reproductive/Obstetrics negative OB ROS                          Anesthesia Physical Anesthesia Plan  ASA: II  Anesthesia Plan: Spinal   Post-op Pain Management:    Induction: Intravenous  Airway Management Planned:   Additional Equipment:   Intra-op Plan:   Post-operative Plan: Extubation in OR  Informed Consent: I have reviewed the patients History and Physical, chart, labs and discussed the procedure including the risks, benefits and alternatives for the proposed anesthesia with the patient or authorized representative who has indicated his/her understanding and acceptance.   Dental advisory given  Plan Discussed with: CRNA  Anesthesia Plan Comments: (Discussed risks/benefits of general versus spinal. Discussed risks/benefits of spinal including headache, backache, failure, bleeding, infection, and nerve damage. Patient consents to spinal. Questions answered. Coagulation studies and  platelet count acceptable.)      Anesthesia Quick Evaluation

## 2012-10-14 NOTE — Anesthesia Postprocedure Evaluation (Signed)
  Anesthesia Post-op Note  Patient: Renee [name]  Procedure(s) Performed: Procedure(s) (LRB): LEFT TOTAL KNEE ARTHROPLASTY (Left)  Patient Location: PACU  Anesthesia Type: Spinal  Level of Consciousness: awake and alert   Airway and Oxygen Therapy: Patient Spontanous Breathing  Post-op Pain: mild  Post-op Assessment: Post-op Vital signs reviewed, Patient's Cardiovascular Status Stable, Respiratory Function Stable, Patent Airway and No signs of Nausea or vomiting  Last Vitals:  Filed Vitals:   10/14/12 1415  BP: 130/73  Pulse: 98  Temp: 36.3 C  Resp: 18    Post-op Vital Signs: stable   Complications: No apparent anesthesia complications. Moving feet.

## 2012-10-14 NOTE — Op Note (Signed)
Pre-operative diagnosis- Osteoarthritis  Left knee(s)  Post-operative diagnosis- Osteoarthritis Left knee(s)  Procedure-  Left  Total Knee Arthroplasty  Surgeon- Gus Rankin. Akbar Sacra, MD  Assistant- Dimitri Ped, PA-C   Anesthesia-  Spinal EBL-* No blood loss amount entered *  Drains Hemovac  Tourniquet time- 33 minutes @ 300 mm Hg    Complications- None  Condition-PACU - hemodynamically stable.   Brief Clinical Note  Renee [name] is a 72 y.o. year old female with end stage OA of her left knee with progressively worsening pain and dysfunction. She has constant pain, with activity and at rest and significant functional deficits with difficulties even with ADLs. She has had extensive non-op management including analgesics, injections of cortisone and viscosupplements, and home exercise program, but remains in significant pain with significant dysfunction. Radiographs show bone on bone arthritis medial and patellofemoral. She presents now for left Total Knee Arthroplasty.    Procedure in detail---   The patient is brought into the operating room and positioned supine on the operating table. After successful administration of  Spinal,   a tourniquet is placed high on the  Left thigh(s) and the lower extremity is prepped and draped in the usual sterile fashion. Time out is performed by the operating team and then the  Left lower extremity is wrapped in Esmarch, knee flexed and the tourniquet inflated to 300 mmHg.       A midline incision is made with a ten blade through the subcutaneous tissue to the level of the extensor mechanism. A fresh blade is used to make a medial parapatellar arthrotomy. Soft tissue over the proximal medial tibia is subperiosteally elevated to the joint line with a knife and into the semimembranosus bursa with a Cobb elevator. Soft tissue over the proximal lateral tibia is elevated with attention being paid to avoiding the patellar tendon on the tibial tubercle. The  patella is everted, knee flexed 90 degrees and the ACL and PCL are removed. Findings are bone on bone medial and patellofemoral with large global osteophytes.        The drill is used to create a starting hole in the distal femur and the canal is thoroughly irrigated with sterile saline to remove the fatty contents. The 5 degree Left  valgus alignment guide is placed into the femoral canal and the distal femoral cutting block is pinned to remove 10 mm off the distal femur. Resection is made with an oscillating saw.      The tibia is subluxed forward and the menisci are removed. The extramedullary alignment guide is placed referencing proximally at the medial aspect of the tibial tubercle and distally along the second metatarsal axis and tibial crest. The block is pinned to remove 2mm off the more deficient medial  side. Resection is made with an oscillating saw. Size 2.5is the most appropriate size for the tibia and the proximal tibia is prepared with the modular drill and keel punch for that size.      The femoral sizing guide is placed and size 3 is most appropriate. Rotation is marked off the epicondylar axis and confirmed by creating a rectangular flexion gap at 90 degrees. The size 3 cutting block is pinned in this rotation and the anterior, posterior and chamfer cuts are made with the oscillating saw. The intercondylar block is then placed and that cut is made.      Trial size 2.5 tibial component, trial size 3 posterior stabilized femur and a 10  mm posterior stabilized  rotating platform insert trial is placed. Full extension is achieved with excellent varus/valgus and anterior/posterior balance throughout full range of motion. The patella is everted and thickness measured to be 22  mm. Free hand resection is taken to 12 mm, a 38 template is placed, lug holes are drilled, trial patella is placed, and it tracks normally. Osteophytes are removed off the posterior femur with the trial in place. All trials  are removed and the cut bone surfaces prepared with pulsatile lavage. Cement is mixed and once ready for implantation, the size 2.5 tibial implant, size  3 posterior stabilized femoral component, and the size 38 patella are cemented in place and the patella is held with the clamp. The trial insert is placed and the knee held in full extension. The Exparel (20 ml mixed with 30 ml saline) and .25% Bupivicaine, are injected into the extensor mechanism, posterior capsule, medial and lateral gutters and subcutaneous tissues.  All extruded cement is removed and once the cement is hard the permanent 10 mm posterior stabilized rotating platform insert is placed into the tibial tray.      The wound is copiously irrigated with saline solution and the extensor mechanism closed over a hemovac drain with #1 PDS suture. The tourniquet is released for a total tourniquet time of 33  minutes. Flexion against gravity is 140 degrees and the patella tracks normally. Subcutaneous tissue is closed with 2.0 vicryl and subcuticular with running 4.0 Monocryl. The incision is cleaned and dried and steri-strips and a bulky sterile dressing are applied. The limb is placed into a knee immobilizer and the patient is awakened and transported to recovery in stable condition.      Please note that a surgical assistant was a medical necessity for this procedure in order to perform it in a safe and expeditious manner. Surgical assistant was necessary to retract the ligaments and vital neurovascular structures to prevent injury to them and also necessary for proper positioning of the limb to allow for anatomic placement of the prosthesis.   Gus Rankin Cung Masterson, MD    10/14/2012, 10:21 AM

## 2012-10-14 NOTE — H&P (View-Only) (Signed)
Renee [name]  DOB: 08/08/1940 Married / Language: English / Race: White Female  Date of Admission:  10/14/2012  Chief Complaint:  Left Knee Pain  History of Present Illness The patient is a 72 year old female who comes in for a preoperative History and Physical. The patient is scheduled for a left total knee arthroplasty to be performed by Dr. Gus Rankin. Aluisio, MD at [facility] on 10/14/2012. The patient is a 72 year old female who presents for follow up of their knee. The patient is being followed for their left knee pain and osteoarthritis. They are now month(s) out from Euflexxa series. Symptoms reported today include: pain, aching, stiffness, pain with weightbearing, difficulty ambulating and difficulty arising from chair. The patient feels that they are doing poorly and report their pain level to be moderate to severe. Current treatment includes: modified weightbearing (with the use of one crutch). Note for "Follow-up Knee": She had some surgery on her left heel and was in a cam walker. She said once the boot was taken off, she could not walk. She is ready to proceed with the total knee replacement. They have been treated conservatively in the past for the above stated problem and despite conservative measures, they continue to have progressive pain and severe functional limitations and dysfunction. They have failed non-operative management including home exercise, medications, and injections. It is felt that they would benefit from undergoing total joint replacement. Risks and benefits of the procedure have been discussed with the patient and they elect to proceed with surgery. There are no active contraindications to surgery such as ongoing infection or rapidly progressive neurological disease.   Problem List Primary osteoarthritis of both knees   Allergies Ibuprofen *ANALGESICS - ANTI-INFLAMMATORY*. blisters Aspirin *ANALGESICS - NonNarcotic* Codeine  Derivatives. Itching. Mood Changes (Vicodin) Lisinopril *ANTIHYPERTENSIVES*. Cough.   Family History No pertinent family history   Social History Illicit drug use. no Exercise. Exercises rarely; does running / walking Marital status. married Living situation. live with spouse Drug/Alcohol Rehab (Previously). no Children. 2 Alcohol use. current drinker; drinks wine; only occasionally per week Drug/Alcohol Rehab (Currently). 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 greater than 5 Tobacco / smoke exposure. no Tobacco use. former smoker; smoke(d) less than 1/2 pack(s) per day   Medication History Losartan Potassium-HCTZ (100-25MG  Tablet, Oral) Active. Crestor (5MG  Tablet, Oral) Active. Zetia (10MG  Tablet, Oral) Active. Cymbalta (60MG  Capsule DR Part, Oral) Active. Aspirin EC (81MG  Tablet DR, Oral) Active. Co Q 10 ( Oral) Specific dose unknown - Active. Multivitamin ( Oral) Active. Vitamin C ( Oral) Active. Vitamin D3 Complete ( Oral) Active. Fish Oil Burp-Less (1200MG  Capsule, Oral) Active.  Past Surgical History Appendectomy Foot Surgery. right Colon Polyp Removal - Colonoscopy Tonsillectomy Mammoplasty; Reduction. bilateral Hysterectomy. Date: 67. partial (non-cancerous) Spinal Surgery. Date: 21. Mastectomy. Date: 23. bilateral Arthroscopic Knee Surgery - Left. Date: 2002. Postop complication - DVT Arthroscopic Knee Surgery - Right. Date: 2008.   Medical History Hypercholesterolemia Fibromyalgia Osteoporosis Osteoarthritis Breast Cancer DVT. Postop Left Knee Scope - 2002 COPD Gastroesophageal Reflux Disease Diverticulosis Urinary Tract Infection Transfusion history. 1948 Measles Mumps   Review of Systems General:Not Present- Chills, Fever, Night Sweats, Fatigue, Weight Gain, Weight Loss and Memory Loss. Skin:Not Present- Hives, Itching, Rash, Eczema and  Lesions. HEENT:Not Present- Tinnitus, Headache, Double Vision, Visual Loss, Hearing Loss and Dentures. Respiratory:Not Present- Shortness of breath with exertion, Shortness of breath at rest, Allergies, Coughing up blood and Chronic  Cough. Cardiovascular:Not Present- Chest Pain, Racing/skipping heartbeats, Difficulty Breathing Lying Down, Murmur, Swelling and Palpitations. Gastrointestinal:Present- Heartburn. Not Present- Bloody Stool, Abdominal Pain, Vomiting, Nausea, Constipation, Diarrhea, Difficulty Swallowing, Jaundice and Loss of appetitie. Female Genitourinary:Present- Urinating at Night. Not Present- Blood in Urine, Urinary frequency, Weak urinary stream, Discharge, Flank Pain, Incontinence, Painful Urination, Urgency and Urinary Retention. Musculoskeletal:Present- Muscle Weakness, Joint Swelling, Joint Pain, Morning Stiffness and Spasms. Not Present- Muscle Pain and Back Pain. Neurological:Not Present- Tremor, Dizziness, Blackout spells, Paralysis, Difficulty with balance and Weakness. Psychiatric:Not Present- Insomnia.   Vitals Pulse: 108 (Regular) Resp.: 16 (Unlabored) BP: 128/76 (Sitting, Right Arm, Standard)    Physical Exam The physical exam findings are as follows:  Note: Patient is a 72 year old female with continued knee pain.   General Mental Status - Alert, cooperative and good historian. General Appearance- pleasant. Not in acute distress. Orientation- Oriented X3. Build & Nutrition- Well nourished and Well developed.   Head and Neck Head- normocephalic, atraumatic . Neck Global Assessment- supple. no bruit auscultated on the right and no bruit auscultated on the left.   Eye Pupil- Bilateral- Regular and Round. Motion- Bilateral- EOMI.   Chest and Lung Exam Auscultation: Breath sounds:- clear at anterior chest wall and - clear at posterior chest wall. Adventitious sounds:- No Adventitious  sounds.   Cardiovascular Auscultation:Rhythm- Regular rate and rhythm. Heart Sounds- S1 WNL and S2 WNL. Murmurs & Other Heart Sounds:Auscultation of the heart reveals - No Murmurs.   Abdomen Palpation/Percussion:Tenderness- Abdomen is non-tender to palpation. Rigidity (guarding)- Abdomen is soft. Auscultation:Auscultation of the abdomen reveals - Bowel sounds normal.   Female Genitourinary Not done, not pertinent to present illness  Musculoskeletal She is a well developed female. She is alert and oriented. No apparent distress. Both knees show a various deformity. There is no effusion in either knee. Range is about 5-120 on each side with no instability.  Assessment & Plan Primary osteoarthritis of both knees (715.16) Impression: Left Knee Current Plans l Pt Education - How to access health information online: discussed with patient and provided information.  Note: Plan is for a Left Total Knee Replacement by Dr. Lequita Halt.  Plan is to go home.  PCP - Dr. Renne Crigler  The patient will not receive TXA (tranexamic acid) due to: History of DVT postop knee scope - 2002  Time spent ~ 20 minutes  Signed electronically by Lauraine Rinne, III PA-C

## 2012-10-14 NOTE — Anesthesia Procedure Notes (Signed)
Spinal  Patient location during procedure: OR Staffing Anesthesiologist: Azell Der Performed by: anesthesiologist  Preanesthetic Checklist Completed: patient identified, site marked, surgical consent, pre-op evaluation, timeout performed, IV checked, risks and benefits discussed and monitors and equipment checked Spinal Block Patient position: sitting Prep: Betadine Patient monitoring: heart rate, continuous pulse ox and blood pressure Approach: left paramedian Location: L3-4 Injection technique: single-shot Needle Needle type: Spinocan  Needle gauge: 22 G Needle length: 9 cm Additional Notes Expiration date of kit checked and confirmed. Patient tolerated procedure well, without complications. No paresthesia. CSF clear.

## 2012-10-15 DIAGNOSIS — K219 Gastro-esophageal reflux disease without esophagitis: Secondary | ICD-10-CM | POA: Diagnosis present

## 2012-10-15 DIAGNOSIS — I10 Essential (primary) hypertension: Secondary | ICD-10-CM | POA: Diagnosis present

## 2012-10-15 DIAGNOSIS — D62 Acute posthemorrhagic anemia: Secondary | ICD-10-CM | POA: Diagnosis not present

## 2012-10-15 DIAGNOSIS — E785 Hyperlipidemia, unspecified: Secondary | ICD-10-CM | POA: Diagnosis present

## 2012-10-15 LAB — CBC
HCT: 30 % — ABNORMAL LOW (ref 36.0–46.0)
Hemoglobin: 10.3 g/dL — ABNORMAL LOW (ref 12.0–15.0)
MCH: 31.8 pg (ref 26.0–34.0)
MCHC: 34.3 g/dL (ref 30.0–36.0)
MCV: 92.6 fL (ref 78.0–100.0)
Platelets: 244 10*3/uL (ref 150–400)
RBC: 3.24 MIL/uL — ABNORMAL LOW (ref 3.87–5.11)
RDW: 12.8 % (ref 11.5–15.5)
WBC: 11.8 10*3/uL — ABNORMAL HIGH (ref 4.0–10.5)

## 2012-10-15 LAB — BASIC METABOLIC PANEL
BUN: 12 mg/dL (ref 6–23)
CO2: 30 mEq/L (ref 19–32)
Calcium: 8.8 mg/dL (ref 8.4–10.5)
Chloride: 99 mEq/L (ref 96–112)
Creatinine, Ser: 0.57 mg/dL (ref 0.50–1.10)
GFR calc Af Amer: >90 mL/min (ref >90)
GFR calc non Af Amer: >90 mL/min (ref >90)
Glucose, Bld: 131 mg/dL — ABNORMAL HIGH (ref 70–99)
Potassium: 3.6 mEq/L (ref 3.5–5.1)
Sodium: 136 mEq/L (ref 135–145)

## 2012-10-15 MED ORDER — ALUM & MAG HYDROXIDE-SIMETH 200-200-20 MG/5ML PO SUSP: 30.0000 mL | ORAL | Status: DC | PRN

## 2012-10-15 NOTE — Evaluation (Signed)
Occupational Therapy Evaluation and Discharge Summary Patient Details Name: Renee [name] MRN: [id number] DOB: 1940-02-12 Today's Date: 10/15/2012 Time: 4098-1191 OT Time Calculation (min): 32 min  OT Assessment / Plan / Recommendation History of present illness 72 y.o. female with h/o L heel spur surgery 5/14 and DVT admitted for L TKA.   Clinical Impression   Pt admitted for L TKA and doing very well with adls and adl mobility. Pt has 24/7 assist at home and therefore does not need further OT services.  All education complete.    OT Assessment  Patient does not need any further OT services    Follow Up Recommendations  No OT follow up    Barriers to Discharge      Equipment Recommendations  Other (comment) (may want tub bench but will try w/o to start.)    Recommendations for Other Services    Frequency       Precautions / Restrictions Precautions Precautions: Knee Precaution Comments: pt able to do L SLR independently Restrictions Weight Bearing Restrictions: No   Pertinent Vitals/Pain Pt with very little c/o pain.  Pt with N/V.  Nursing notified.    ADL  Eating/Feeding: Performed;Independent Where Assessed - Eating/Feeding: Chair Grooming: Performed;Set up Where Assessed - Grooming: Supported sitting Upper Body Bathing: Performed;Set up Where Assessed - Upper Body Bathing: Unsupported sitting Lower Body Bathing: Simulated;Min guard Where Assessed - Lower Body Bathing: Supported sit to stand Upper Body Dressing: Simulated;Set up Where Assessed - Upper Body Dressing: Unsupported sitting Lower Body Dressing: Performed;Min guard Where Assessed - Lower Body Dressing: Supported sit to stand Toilet Transfer: Performed;Min guard Statistician Method: Surveyor, minerals: Materials engineer and Hygiene: Performed;Supervision/safety Where Assessed - Engineer, mining and Hygiene: Sit to stand from 3-in-1  or toilet Equipment Used: Rolling walker Transfers/Ambulation Related to ADLs: Pt vomited when preparing to walk to br so just brought BSC to her to use to toilet.  Pt returned to bed due to nausea. ADL Comments: Pt does very well with adls and does not need equipment.  Pt able to donn socks and shoes on B feet.    OT Diagnosis:    OT Problem List:   OT Treatment Interventions:     OT Goals(Current goals can be found in the care plan section) Acute Rehab OT Goals Patient Stated Goal: just to be able to walk  Visit Information  Last OT Received On: 10/15/12 Assistance Needed: +1 History of Present Illness: 72 y.o. female with h/o L heel spur surgery 5/14 and DVT admitted for L TKA.       Prior Functioning     Home Living Family/patient expects to be discharged to:: Private residence Living Arrangements: Spouse/significant other Available Help at Discharge: Family;Available 24 hours/day Type of Home: House Home Access: Stairs to enter [employer] of Steps: 3 Entrance Stairs-Rails: None Home Layout: One level Home Equipment: Crutches;Bedside commode Additional Comments: has tub and a walk in shower Prior Function Level of Independence: Independent Communication Communication: No difficulties Dominant Hand: Right         Vision/Perception     Cognition  Cognition Arousal/Alertness: Awake/alert Behavior During Therapy: WFL for tasks assessed/performed Overall Cognitive Status: Within Functional Limits for tasks assessed    Extremity/Trunk Assessment Upper Extremity Assessment Upper Extremity Assessment: Overall WFL for tasks assessed Lower Extremity Assessment Lower Extremity Assessment: Defer to PT evaluation LLE Deficits / Details: L knee flexion AAROM 85*, SLR 3/5, ankle WNL, sensation intact  Cervical / Trunk Assessment Cervical / Trunk Assessment: Normal     Mobility Bed Mobility Bed Mobility: Sit to Supine Supine to Sit: HOB elevated;6:  Modified independent (Device/Increase time) Sit to Supine: 4: Min assist Details for Bed Mobility Assistance: pt self assisted LLE with RLE to EOB, HOB 35* Transfers Transfers: Sit to Stand;Stand to Sit Sit to Stand: 4: Min guard;From bed;With upper extremity assist Stand to Sit: 4: Min guard;To chair/3-in-1;With armrests;With upper extremity assist Details for Transfer Assistance: VCs hand placement, min guard for safety     Exercise Total Joint Exercises Ankle Circles/Pumps: AROM;Both;10 reps;Supine Quad Sets: AROM;Left;5 reps;Supine Heel Slides: AAROM;Left;10 reps;Supine Straight Leg Raises: AROM;Left;5 reps;Supine Goniometric ROM: 85* flexion L knee AAROM   Balance Balance Balance Assessed: Yes Static Sitting Balance Static Sitting - Balance Support: No upper extremity supported;Feet supported Static Sitting - Level of Assistance: 7: Independent Static Sitting - Comment/# of Minutes: 2   End of Session OT - End of Session Equipment Utilized During Treatment: Rolling walker Activity Tolerance: Patient tolerated treatment well Patient left: in bed;with call bell/phone within reach Nurse Communication: Mobility status CPM Left Knee CPM Left Knee: Off  GO     Hope Budds 10/15/2012, 10:55 AM [phone]

## 2012-10-15 NOTE — Care Management Note (Addendum)
    Page 1 of 2   10/16/2012     3:42:20 PM   CARE MANAGEMENT NOTE 10/16/2012  Patient:  Renee [name], Renee [name]   Account Number:  [id number]  Date Initiated:  10/15/2012  Documentation initiated by:  Colleen Can  Subjective/Objective Assessment:   dx OA left knee: total knee replacemnt     Action/Plan:   CM spoke with patient and spouse. Plans are for patient to return to her home in [place] where spouse will be caregiver. She needs RW and 3n1.   Anticipated DC Date:  10/16/2012   Anticipated DC Plan:  HOME W HOME HEALTH SERVICES      DC Planning Services  CM consult      PAC Choice  DURABLE MEDICAL EQUIPMENT  HOME HEALTH   Choice offered to / List presented to:  C-1 Patient   DME arranged  3-N-1  Levan Hurst      DME agency  [facility]     HH arranged  HH-2 PT      [facility] agency  [facility]   Status of service:  Completed, signed off Medicare Important Message given?  NA - LOS <3 / Initial given by admissions (If response is "NO", the following Medicare IM given date fields will be blank) Date Medicare IM given:   Date Additional Medicare IM given:    Discharge Disposition:    Per UR Regulation:    If discussed at Long Length of Stay Meetings, dates discussed:    Comments:  10/15/2012 Colleen Can BSN RN CCM [phone] [facility] notified of request for DME & HHpt services. They will be able to provide HHpt services with start of day after discharge.

## 2012-10-15 NOTE — Evaluation (Addendum)
Physical Therapy Evaluation Patient Details Name: Renee [name] MRN: [id number] DOB: [birth year] Today's Date: 10/15/2012 Time: 7829-5621 PT Time Calculation (min): 48 min  PT Assessment / Plan / Recommendation History of Present Illness  72 y.o. female with h/o L heel spur surgery 5/14 and DVT admitted for L TKA.  Clinical Impression  ** Expect excellent progress as pt is highly motivated. She would benefit from acute PT to maximize safety and independence with mobility. Pt is s/p TKA resulting in the deficits listed below (see PT Problem List).  Pt will benefit from skilled PT to increase their independence and safety with mobility to allow discharge to the venue listed below.  *    PT Assessment  Patient needs continued PT services    Follow Up Recommendations  Home health PT    Does the patient have the potential to tolerate intense rehabilitation      Barriers to Discharge        Equipment Recommendations  Rolling walker with 5" wheels    Recommendations for Other Services OT consult   Frequency 7X/week    Precautions / Restrictions Precautions Precautions: Knee Precaution Comments: pt able to do L SLR independently Restrictions Weight Bearing Restrictions: No   Pertinent Vitals/Pain *0/10 at rest premedicated "tolerable" with walking, pt didn't want to rate pain Ice applied to L knee after PT session**      Mobility  Bed Mobility Bed Mobility: Supine to Sit Supine to Sit: HOB elevated;6: Modified independent (Device/Increase time) Details for Bed Mobility Assistance: pt self assisted LLE with RLE to EOB, HOB 35* Transfers Transfers: Sit to Stand;Stand to Sit Sit to Stand: 4: Min guard;From bed;With upper extremity assist Stand to Sit: 4: Min guard;To chair/3-in-1;With armrests;With upper extremity assist Details for Transfer Assistance: VCs hand placement, min guard for safety Ambulation/Gait Ambulation/Gait Assistance: 4: Min guard Ambulation Distance  (Feet): 30 Feet Assistive device: Rolling walker Ambulation/Gait Assistance Details: VCs for sequencing, positioning in RW  and for flexed neck Gait Pattern: Step-to pattern;Decreased step length - right;Decreased step length - left;Antalgic Gait velocity: decreased    Exercises Total Joint Exercises Ankle Circles/Pumps: AROM;Both;10 reps;Supine Quad Sets: AROM;Left;5 reps;Supine Heel Slides: AAROM;Left;10 reps;Supine Straight Leg Raises: AROM;Left;5 reps;Supine Goniometric ROM: 85* flexion L knee AAROM   PT Diagnosis: Difficulty walking;Acute pain  PT Problem List: Decreased strength;Decreased range of motion;Decreased activity tolerance;Decreased mobility;Decreased knowledge of use of DME;Pain PT Treatment Interventions: DME instruction;Gait training;Stair training;Functional mobility training;Therapeutic activities;Therapeutic exercise;Patient/family education     PT Goals(Current goals can be found in the care plan section) Acute Rehab PT Goals Patient Stated Goal: just to be able to walk PT Goal Formulation: With patient Time For Goal Achievement: 10/29/12 Potential to Achieve Goals: Good  Visit Information  Last PT Received On: 10/15/12 Assistance Needed: +1 History of Present Illness: 72 y.o. female with h/o L heel spur surgery 5/14 and DVT admitted for L TKA.       Prior Functioning  Home Living Family/patient expects to be discharged to:: Private residence Living Arrangements: Spouse/significant other Available Help at Discharge: Family;Available 24 hours/day Type of Home: House Home Access: Stairs to enter [employer] of Steps: 3 Entrance Stairs-Rails: None Home Layout: One level Home Equipment: Crutches;Bedside commode Additional Comments: has tub and a walk in shower Prior Function Level of Independence: Independent Communication Communication: No difficulties    Cognition  Cognition Arousal/Alertness: Awake/alert Behavior During Therapy: WFL  for tasks assessed/performed Overall Cognitive Status: Within Functional Limits for tasks assessed  Extremity/Trunk Assessment Upper Extremity Assessment Upper Extremity Assessment: Overall WFL for tasks assessed Lower Extremity Assessment Lower Extremity Assessment: LLE deficits/detail LLE Deficits / Details: L knee flexion AAROM 85*, SLR 3/5, ankle WNL, sensation intact Cervical / Trunk Assessment Cervical / Trunk Assessment: Normal   Balance Balance Balance Assessed: Yes Static Sitting Balance Static Sitting - Balance Support: No upper extremity supported;Feet supported Static Sitting - Level of Assistance: 7: Independent Static Sitting - Comment/# of Minutes: 2  End of Session PT - End of Session Equipment Utilized During Treatment: Gait belt Activity Tolerance: Patient tolerated treatment well;Treatment limited secondary to medical complications (Comment) (some nausea with walking, resolved with rest) Patient left: in chair;with call bell/phone within reach Nurse Communication: Mobility status CPM Left Knee CPM Left Knee: Off  GP     Ralene Bathe Kistler 10/15/2012, 9:27 AM [phone]

## 2012-10-15 NOTE — Progress Notes (Addendum)
Physical Therapy Treatment Patient Details Name: Renee [name] MRN: [id number] DOB: 04/25/1940 Today's Date: 10/15/2012 Time: 4098-1191 PT Time Calculation (min): 13 min  PT Assessment / Plan / Recommendation  History of Present Illness 72 y.o. female with h/o L heel spur surgery 5/14 and DVT admitted for L TKA.   PT Comments   *Pt had recent vomiting and remains nauseous. Gait deferred. Performed R TKA exercises. Expect excellent progress once nausea resolves.**  Follow Up Recommendations  Home health PT     Does the patient have the potential to tolerate intense rehabilitation     Barriers to Discharge        Equipment Recommendations  Rolling walker with 5" wheels    Recommendations for Other Services OT consult  Frequency 7X/week   Progress towards PT Goals Progress towards PT goals: Progressing toward goals  Plan Current plan remains appropriate    Precautions / Restrictions Precautions Precautions: Knee Precaution Comments: pt able to do L SLR independently Restrictions Weight Bearing Restrictions: No   Pertinent Vitals/Pain *5/10 L knee with activity Premedicated, ice applied**       Exercises Total Joint Exercises Ankle Circles/Pumps: AROM;Both;10 reps;Supine Quad Sets: AROM;Left;Supine;10 reps Towel Squeeze: AROM;Both;10 reps;Supine Short Arc Quad: AROM;Left;10 reps Heel Slides: AAROM;Left;10 reps;Supine Hip ABduction/ADduction: AROM;Left;10 reps;Supine Straight Leg Raises: AROM;Left;Supine;10 reps Goniometric ROM: 85* flexion L knee AAROM   PT Diagnosis: Difficulty walking;Acute pain  PT Problem List: Decreased strength;Decreased range of motion;Decreased activity tolerance;Decreased mobility;Decreased knowledge of use of DME;Pain PT Treatment Interventions: DME instruction;Gait training;Stair training;Functional mobility training;Therapeutic activities;Therapeutic exercise;Patient/family education   PT Goals (current goals can now be found in the care  plan section) Acute Rehab PT Goals Patient Stated Goal: just to be able to walk PT Goal Formulation: With patient Time For Goal Achievement: 10/29/12 Potential to Achieve Goals: Good  Visit Information  Last PT Received On: 10/15/12 Assistance Needed: +1 History of Present Illness: 72 y.o. female with h/o L heel spur surgery 5/14 and DVT admitted for L TKA.    Subjective Data  Patient Stated Goal: just to be able to walk   Cognition  Cognition Arousal/Alertness: Awake/alert Behavior During Therapy: WFL for tasks assessed/performed Overall Cognitive Status: Within Functional Limits for tasks assessed    Balance  Balance Balance Assessed: Yes Static Sitting Balance Static Sitting - Balance Support: No upper extremity supported;Feet supported Static Sitting - Level of Assistance: 7: Independent Static Sitting - Comment/# of Minutes: 2  End of Session PT - End of Session Equipment Utilized During Treatment: Gait belt Activity Tolerance: Patient tolerated treatment well;Treatment limited secondary to medical complications (Comment) (Pt has had nausea and vomiting, gait deferred) Patient left: with call bell/phone within reach;in bed Nurse Communication: Mobility status CPM Left Knee CPM Left Knee: Off   GP     Renee [name] 10/15/2012, 12:15 PM [phone]

## 2012-10-15 NOTE — Progress Notes (Signed)
   Subjective: 1 Day Post-Op Procedure(s) (LRB): LEFT TOTAL KNEE ARTHROPLASTY (Left) Patient reports pain as mild.   Patient seen in rounds with Dr. Lequita Halt. Pain managed Patient is well, but has had some minor complaints of pain in the knee, requiring pain medications   We will start therapy today.  Plan is to go Home after hospital stay.  Objective: Vital signs in last 24 hours: Temp:  [96.3 F (35.7 C)-98.2 F (36.8 C)] 96.3 F (35.7 C) (09/16 0551) Pulse Rate:  [76-110] 91 (09/16 0551) Resp:  [12-22] 16 (09/16 0551) BP: (104-136)/(64-82) 107/68 mmHg (09/16 0551) SpO2:  [97 %-100 %] 99 % (09/16 0551) Weight:  [77.111 kg (170 lb)] 77.111 kg (170 lb) (09/15 1215)  Intake/Output from previous day:  Intake/Output Summary (Last 24 hours) at 10/15/12 0849 Last data filed at 10/15/12 0700  Gross per 24 hour  Intake 4183.75 ml  Output   3980 ml  Net 203.75 ml    Intake/Output this shift: UOP 950 since MN  Labs:  Recent Labs  10/15/12 0457  HGB 10.3*    Recent Labs  10/15/12 0457  WBC 11.8*  RBC 3.24*  HCT 30.0*  PLT 244    Recent Labs  10/15/12 0457  NA 136  K 3.6  CL 99  CO2 30  BUN 12  CREATININE 0.57  GLUCOSE 131*  CALCIUM 8.8   No results found for this basename: LABPT, INR,  in the last 72 hours  EXAM General - Patient is Alert, Appropriate and Oriented Extremity - Neurovascular intact Sensation intact distally Dressing - dressing C/D/I Motor Function - intact, moving foot and toes well on exam.  Hemovac pulled without difficulty.  Past Medical History  Diagnosis Date  . Blood transfusion 1948  . Cataract     both eyes  . Hyperlipidemia   . Hypertension   . Fibromyalgia   . Arthritis     oa  . DVT (deep venous thrombosis) 2002    left knee  . GERD (gastroesophageal reflux disease)   . Diverticulosis   . Measles as child  . Mumps age 74  . COPD (chronic obstructive pulmonary disease)   . Breast cancer 1994    bilateral  . Disc  disease, degenerative, cervical     trouble turing head and neck at times  . PONV (postoperative nausea and vomiting)     Assessment/Plan: 1 Day Post-Op Procedure(s) (LRB): LEFT TOTAL KNEE ARTHROPLASTY (Left) Principal Problem:   OA (osteoarthritis) of knee Active Problems:   Postoperative anemia due to acute blood loss   GERD (gastroesophageal reflux disease) - no complaints, PRN meds   Unspecified essential hypertension - resumed Hyzaar   Hyperlipidemia - resumed Zetia, will resume Crestor at discharge  Estimated body mass index is 29.17 kg/(m^2) as calculated from the following:   Height as of this encounter: 5\' 4"  (1.626 m).   Weight as of this encounter: 77.111 kg (170 lb). Advance diet Up with therapy Plan for discharge tomorrow Discharge home with home health  DVT Prophylaxis - Xarelto Weight-Bearing as tolerated to left leg No vaccines. D/C O2 and Pulse OX and try on Room [address]  Patrica Duel 10/15/2012, 8:49 AM

## 2012-10-16 LAB — BASIC METABOLIC PANEL
BUN: 10 mg/dL (ref 6–23)
CO2: 30 mEq/L (ref 19–32)
Calcium: 8.7 mg/dL (ref 8.4–10.5)
Chloride: 88 mEq/L — ABNORMAL LOW (ref 96–112)
Creatinine, Ser: 0.52 mg/dL (ref 0.50–1.10)
GFR calc Af Amer: >90 mL/min (ref >90)
GFR calc non Af Amer: >90 mL/min (ref >90)
Glucose, Bld: 154 mg/dL — ABNORMAL HIGH (ref 70–99)
Potassium: 3.3 mEq/L — ABNORMAL LOW (ref 3.5–5.1)
Sodium: 126 mEq/L — ABNORMAL LOW (ref 135–145)

## 2012-10-16 LAB — CBC
HCT: 25.5 % — ABNORMAL LOW (ref 36.0–46.0)
Hemoglobin: 8.9 g/dL — ABNORMAL LOW (ref 12.0–15.0)
MCH: 31.7 pg (ref 26.0–34.0)
MCHC: 34.9 g/dL (ref 30.0–36.0)
MCV: 90.7 fL (ref 78.0–100.0)
Platelets: 224 10*3/uL (ref 150–400)
RBC: 2.81 MIL/uL — ABNORMAL LOW (ref 3.87–5.11)
RDW: 12.9 % (ref 11.5–15.5)
WBC: 14.4 10*3/uL — ABNORMAL HIGH (ref 4.0–10.5)

## 2012-10-16 MED ORDER — RIVAROXABAN 10 MG PO TABS: 10.0000 mg | ORAL_TABLET | Freq: Every day | ORAL | Status: DC

## 2012-10-16 MED ORDER — HYDROMORPHONE HCL 2 MG PO TABS: 2.0000 mg | ORAL_TABLET | ORAL | Status: DC | PRN

## 2012-10-16 MED ORDER — METHOCARBAMOL 500 MG PO TABS: 500.0000 mg | ORAL_TABLET | Freq: Four times a day (QID) | ORAL | Status: DC | PRN

## 2012-10-16 MED ORDER — HYDROMORPHONE HCL 2 MG PO TABS
2.0000 mg | ORAL_TABLET | ORAL | Status: DC | PRN
  Administered 2012-10-16: 2 mg via ORAL
  Filled 2012-10-16: qty 1

## 2012-10-16 MED ORDER — PROMETHAZINE HCL 25 MG PO TABS: 12.0000 mg | ORAL_TABLET | Freq: Four times a day (QID) | ORAL | Status: DC | PRN

## 2012-10-16 MED ORDER — PROMETHAZINE HCL 25 MG PO TABS
25.0000 mg | ORAL_TABLET | Freq: Four times a day (QID) | ORAL | Status: DC | PRN
  Administered 2012-10-16: 25 mg via ORAL
  Filled 2012-10-16: qty 1

## 2012-10-16 NOTE — Progress Notes (Signed)
   Subjective: 2 Days Post-Op Procedure(s) (LRB): LEFT TOTAL KNEE ARTHROPLASTY (Left) Patient reports pain as mild.   Patient seen in rounds with Dr. Lequita Halt. Some nausea.  Will change pain medication to Dilaudid. Patient is having problems with nausea Plan is to go Home after hospital stay.  Objective: Vital signs in last 24 hours: Temp:  [97.8 F (36.6 C)-98.7 F (37.1 C)] 98 F (36.7 C) (09/17 0551) Pulse Rate:  [93-108] 95 (09/17 0551) Resp:  [16-18] 18 (09/17 0551) BP: (107-152)/(68-79) 152/79 mmHg (09/17 0551) SpO2:  [95 %-99 %] 99 % (09/17 0551)  Intake/Output from previous day:  Intake/Output Summary (Last 24 hours) at 10/16/12 0801 Last data filed at 10/16/12 0540  Gross per 24 hour  Intake 1512.5 ml  Output   1825 ml  Net -312.5 ml    Intake/Output this shift:    Labs:  Recent Labs  10/15/12 0457 10/16/12 0425  HGB 10.3* 8.9*    Recent Labs  10/15/12 0457 10/16/12 0425  WBC 11.8* 14.4*  RBC 3.24* 2.81*  HCT 30.0* 25.5*  PLT 244 224    Recent Labs  10/15/12 0457 10/16/12 0425  NA 136 126*  K 3.6 3.3*  CL 99 88*  CO2 30 30  BUN 12 10  CREATININE 0.57 0.52  GLUCOSE 131* 154*  CALCIUM 8.8 8.7   No results found for this basename: LABPT, INR,  in the last 72 hours  EXAM General - Patient is Alert, Appropriate and Oriented Extremity - Neurovascular intact Sensation intact distally Dorsiflexion/Plantar flexion intact Dressing/Incision - clean, dry, no drainage Motor Function - intact, moving foot and toes well on exam.   Past Medical History  Diagnosis Date  . Blood transfusion 1948  . Cataract     both eyes  . Hyperlipidemia   . Hypertension   . Fibromyalgia   . Arthritis     oa  . DVT (deep venous thrombosis) 2002    left knee  . GERD (gastroesophageal reflux disease)   . Diverticulosis   . Measles as child  . Mumps age 44  . COPD (chronic obstructive pulmonary disease)   . Breast cancer 1994    bilateral  . Disc  disease, degenerative, cervical     trouble turing head and neck at times  . PONV (postoperative nausea and vomiting)     Assessment/Plan: 2 Days Post-Op Procedure(s) (LRB): LEFT TOTAL KNEE ARTHROPLASTY (Left) Principal Problem:   OA (osteoarthritis) of knee Active Problems:   Postoperative anemia due to acute blood loss   GERD (gastroesophageal reflux disease)   Unspecified essential hypertension   Hyperlipidemia  Estimated body mass index is 29.17 kg/(m^2) as calculated from the following:   Height as of this encounter: 5\' 4"  (1.626 m).   Weight as of this encounter: 77.111 kg (170 lb). Up with therapy Discharge home with home health when stable. Possible home later today if nausea is resolved and meets goals.  DVT Prophylaxis - Xarelto Weight-Bearing as tolerated to left leg  PERKINS, ALEXZANDREW 10/16/2012, 8:01 AM

## 2012-10-16 NOTE — Progress Notes (Signed)
Physical Therapy Treatment Patient Details Name: Renee [name] MRN: [id number] DOB: [birth year] Today's Date: 10/16/2012 Time: 4098-1191 PT Time Calculation (min): 19 min  PT Assessment / Plan / Recommendation  History of Present Illness 72 y.o. female with h/o L heel spur surgery 5/14 and DVT admitted for L TKA.   PT Comments   **Nausea and vomiting limiting activity tolerance. Pt performed TKA exercises, but had vomiting with attempt to sit up in bed. Demonstrated backwards stair technique with no rails. Will attempt ambulation/stairs later today if nausea resolved. *  Follow Up Recommendations  Home health PT     Does the patient have the potential to tolerate intense rehabilitation     Barriers to Discharge        Equipment Recommendations  Rolling walker with 5" wheels    Recommendations for Other Services OT consult  Frequency 7X/week   Progress towards PT Goals Progress towards PT goals: Not progressing toward goals - comment (nausea and vomiting limiting activity)  Plan Current plan remains appropriate    Precautions / Restrictions Precautions Precautions: Knee Precaution Comments: pt able to do L SLR independently Restrictions Weight Bearing Restrictions: No   Pertinent Vitals/Pain *6/10 L knee Ice applied**    Mobility  Bed Mobility Bed Mobility: Not assessed (deferred 2* nausea and vomiting) Ambulation/Gait Stairs:  (demonstrated backwards technique to pt)    Exercises Total Joint Exercises Ankle Circles/Pumps: AROM;Both;10 reps;Supine Quad Sets: AROM;Left;Supine;10 reps Towel Squeeze: AROM;Both;10 reps;Supine;15 reps Short Arc Quad: AROM;Left;10 reps Heel Slides: AAROM;Left;10 reps;Supine Hip ABduction/ADduction: AROM;Left;10 reps;Supine Straight Leg Raises: AROM;Left;Supine;10 reps   PT Diagnosis:    PT Problem List:   PT Treatment Interventions:     PT Goals (current goals can now be found in the care plan section) Acute Rehab PT Goals Patient  Stated Goal: just to be able to walk PT Goal Formulation: With patient Time For Goal Achievement: 10/29/12 Potential to Achieve Goals: Good  Visit Information  Last PT Received On: 10/16/12 Assistance Needed: +1 History of Present Illness: 72 y.o. female with h/o L heel spur surgery 5/14 and DVT admitted for L TKA.    Subjective Data  Patient Stated Goal: just to be able to walk   Cognition  Cognition Arousal/Alertness: Awake/alert Behavior During Therapy: WFL for tasks assessed/performed Overall Cognitive Status: Within Functional Limits for tasks assessed    Balance     End of Session PT - End of Session Equipment Utilized During Treatment: Gait belt Activity Tolerance: Treatment limited secondary to medical complications (Comment) (Pt has had nausea and vomiting, gait deferred) Patient left: with call bell/phone within reach;in bed Nurse Communication: Mobility status   GP     Ralene Bathe Kistler 10/16/2012, 10:40 AM [phone]

## 2012-10-16 NOTE — Progress Notes (Addendum)
Physical Therapy Treatment Patient Details Name: Renee [name] MRN: [id number] DOB: 10-12-1940 Today's Date: 10/16/2012 Time: 1215-1232 PT Time Calculation (min): 17 min  PT Assessment / Plan / Recommendation  History of Present Illness 72 y.o. female with h/o L heel spur surgery 5/14 and DVT admitted for L TKA.   PT Comments   **Pt reports decreased nausea. Ambulated 180' with RW, stair training completed. Progressing well with mobility. *  Follow Up Recommendations  Home health PT     Does the patient have the potential to tolerate intense rehabilitation     Barriers to Discharge        Equipment Recommendations  Rolling walker with 5" wheels    Recommendations for Other Services OT consult  Frequency 7X/week   Progress towards PT Goals Progress towards PT goals: Progressing toward goals  Plan Current plan remains appropriate    Precautions / Restrictions Precautions Precautions: Knee Precaution Comments: pt able to do L SLR independently Restrictions Weight Bearing Restrictions: No   Pertinent Vitals/Pain *3/10 L knee with walking Premedicated, pt declined ice**    Mobility  Bed Mobility Bed Mobility: Not assessed Transfers Transfers: Sit to Stand;Stand to Sit Sit to Stand: 5: Supervision;From chair/3-in-1 Stand to Sit: 5: Supervision;To chair/3-in-1 Details for Transfer Assistance: VCs hand placement, Ambulation/Gait Ambulation/Gait Assistance: 5: Supervision Ambulation Distance (Feet): 160 Feet Assistive device: Rolling walker Gait Pattern: Step-to pattern;Decreased step length - right;Decreased step length - left;Antalgic Gait velocity: decreased Stairs: Yes Stairs Assistance: 4: Min assist Stairs Assistance Details (indicate cue type and reason): min assist from husband to steady walker Stair Management Technique: With walker;No rails;Backwards Number of Stairs: 2        PT Diagnosis:    PT Problem List:   PT Treatment Interventions:     PT  Goals (current goals can now be found in the care plan section) Acute Rehab PT Goals Patient Stated Goal: just to be able to walk PT Goal Formulation: With patient Time For Goal Achievement: 10/29/12 Potential to Achieve Goals: Good  Visit Information  Last PT Received On: 10/16/12 Assistance Needed: +1 History of Present Illness: 72 y.o. female with h/o L heel spur surgery 5/14 and DVT admitted for L TKA.    Subjective Data  Patient Stated Goal: just to be able to walk   Cognition  Cognition Arousal/Alertness: Awake/alert Behavior During Therapy: WFL for tasks assessed/performed Overall Cognitive Status: Within Functional Limits for tasks assessed    Balance     End of Session PT - End of Session Equipment Utilized During Treatment: Gait belt Activity Tolerance: Patient tolerated treatment well (Pt has had nausea and vomiting, gait deferred) Patient left: with call bell/phone within reach;in chair;with family/visitor present Nurse Communication: Mobility status CPM Left Knee CPM Left Knee: Off   GP     Ralene Bathe Kistler 10/16/2012, 12:44 PM [phone]

## 2012-10-16 NOTE — Progress Notes (Signed)
[facility]  [facility] is providing the following services: RW and [employer]  If patient discharges after hours, please call [phone].   Renard Hamper 10/16/2012, 8:45 AM

## 2012-10-16 NOTE — Discharge Summary (Signed)
Physician Discharge Summary   Patient ID: Renee [name] MRN: [id number] DOB/AGE: April 27, 1940 72 y.o.  Admit date: 10/14/2012 Discharge date: 10/16/2012  Primary Diagnosis:  Osteoarthritis Left knee(s)  Admission Diagnoses:  Past Medical History  Diagnosis Date  . Blood transfusion 1948  . Cataract     both eyes  . Hyperlipidemia   . Hypertension   . Fibromyalgia   . Arthritis     oa  . DVT (deep venous thrombosis) 2002    left knee  . GERD (gastroesophageal reflux disease)   . Diverticulosis   . Measles as child  . Mumps age 67  . COPD (chronic obstructive pulmonary disease)   . Breast cancer 1994    bilateral  . Disc disease, degenerative, cervical     trouble turing head and neck at times  . PONV (postoperative nausea and vomiting)    Discharge Diagnoses:   Principal Problem:   OA (osteoarthritis) of knee Active Problems:   Postoperative anemia due to acute blood loss   GERD (gastroesophageal reflux disease)   Unspecified essential hypertension   Hyperlipidemia  Estimated body mass index is 29.17 kg/(m^2) as calculated from the following:   Height as of this encounter: 5\' 4"  (1.626 m).   Weight as of this encounter: 77.111 kg (170 lb).  Procedure:  Procedure(s) (LRB): LEFT TOTAL KNEE ARTHROPLASTY (Left)   Consults: None  HPI: Renee [name] is a 72 y.o. year old female with end stage OA of her left knee with progressively worsening pain and dysfunction. She has constant pain, with activity and at rest and significant functional deficits with difficulties even with ADLs. She has had extensive non-op management including analgesics, injections of cortisone and viscosupplements, and home exercise program, but remains in significant pain with significant dysfunction. Radiographs show bone on bone arthritis medial and patellofemoral. She presents now for left Total Knee Arthroplasty.   Laboratory Data: Admission on 10/14/2012, Discharged on 10/16/2012    Component Date Value Range Status  . WBC 10/15/2012 11.8* 4.0 - 10.5 K/uL Final  . RBC 10/15/2012 3.24* 3.87 - 5.11 MIL/uL Final  . Hemoglobin 10/15/2012 10.3* 12.0 - 15.0 g/dL Final  . HCT 40/98/1191 30.0* 36.0 - 46.0 % Final  . MCV 10/15/2012 92.6  78.0 - 100.0 fL Final  . MCH 10/15/2012 31.8  26.0 - 34.0 pg Final  . MCHC 10/15/2012 34.3  30.0 - 36.0 g/dL Final  . RDW 47/82/9562 12.8  11.5 - 15.5 % Final  . Platelets 10/15/2012 244  150 - 400 K/uL Final  . Sodium 10/15/2012 136  135 - 145 mEq/L Final  . Potassium 10/15/2012 3.6  3.5 - 5.1 mEq/L Final  . Chloride 10/15/2012 99  96 - 112 mEq/L Final  . CO2 10/15/2012 30  19 - 32 mEq/L Final  . Glucose, Bld 10/15/2012 131* 70 - 99 mg/dL Final  . BUN 13/08/6576 12  6 - 23 mg/dL Final  . Creatinine, Ser 10/15/2012 0.57  0.50 - 1.10 mg/dL Final  . Calcium 46/96/2952 8.8  8.4 - 10.5 mg/dL Final  . GFR calc non Af Amer 10/15/2012 >90  >90 mL/min Final  . GFR calc Af Amer 10/15/2012 >90  >90 mL/min Final   Comment: (NOTE)                          The eGFR has been calculated using the CKD EPI equation.  This calculation has not been validated in all clinical situations.                          eGFR's persistently <90 mL/min signify possible Chronic Kidney                          Disease.  . WBC 10/16/2012 14.4* 4.0 - 10.5 K/uL Final  . RBC 10/16/2012 2.81* 3.87 - 5.11 MIL/uL Final  . Hemoglobin 10/16/2012 8.9* 12.0 - 15.0 g/dL Final  . HCT 16/10/9602 25.5* 36.0 - 46.0 % Final  . MCV 10/16/2012 90.7  78.0 - 100.0 fL Final  . MCH 10/16/2012 31.7  26.0 - 34.0 pg Final  . MCHC 10/16/2012 34.9  30.0 - 36.0 g/dL Final  . RDW 54/09/8117 12.9  11.5 - 15.5 % Final  . Platelets 10/16/2012 224  150 - 400 K/uL Final  . Sodium 10/16/2012 126* 135 - 145 mEq/L Final   Comment: DELTA CHECK NOTED                          REPEATED TO VERIFY  . Potassium 10/16/2012 3.3* 3.5 - 5.1 mEq/L Final  . Chloride 10/16/2012 88* 96 - 112  mEq/L Final   Comment: DELTA CHECK NOTED                          REPEATED TO VERIFY  . CO2 10/16/2012 30  19 - 32 mEq/L Final  . Glucose, Bld 10/16/2012 154* 70 - 99 mg/dL Final  . BUN 14/78/2956 10  6 - 23 mg/dL Final  . Creatinine, Ser 10/16/2012 0.52  0.50 - 1.10 mg/dL Final  . Calcium 21/30/8657 8.7  8.4 - 10.5 mg/dL Final  . GFR calc non Af Amer 10/16/2012 >90  >90 mL/min Final  . GFR calc Af Amer 10/16/2012 >90  >90 mL/min Final   Comment: (NOTE)                          The eGFR has been calculated using the CKD EPI equation.                          This calculation has not been validated in all clinical situations.                          eGFR's persistently <90 mL/min signify possible Chronic Kidney                          Disease.  Hospital Outpatient Visit on 10/07/2012  Component Date Value Range Status  . aPTT 10/07/2012 32  24 - 37 seconds Final  . WBC 10/07/2012 6.8  4.0 - 10.5 K/uL Final  . RBC 10/07/2012 4.46  3.87 - 5.11 MIL/uL Final  . Hemoglobin 10/07/2012 14.4  12.0 - 15.0 g/dL Final  . HCT 84/69/6295 42.0  36.0 - 46.0 % Final  . MCV 10/07/2012 94.2  78.0 - 100.0 fL Final  . MCH 10/07/2012 32.3  26.0 - 34.0 pg Final  . MCHC 10/07/2012 34.3  30.0 - 36.0 g/dL Final  . RDW 28/41/3244 13.0  11.5 - 15.5 % Final  . Platelets 10/07/2012 276  150 - 400 K/uL Final  .  Sodium 10/07/2012 138  135 - 145 mEq/L Final  . Potassium 10/07/2012 4.5  3.5 - 5.1 mEq/L Final  . Chloride 10/07/2012 99  96 - 112 mEq/L Final  . CO2 10/07/2012 31  19 - 32 mEq/L Final  . Glucose, Bld 10/07/2012 118* 70 - 99 mg/dL Final  . BUN 16/10/9602 10  6 - 23 mg/dL Final  . Creatinine, Ser 10/07/2012 0.71  0.50 - 1.10 mg/dL Final  . Calcium 54/09/8117 9.7  8.4 - 10.5 mg/dL Final  . Total Protein 10/07/2012 7.3  6.0 - 8.3 g/dL Final  . Albumin 14/78/2956 3.9  3.5 - 5.2 g/dL Final  . AST 21/30/8657 16  0 - 37 U/L Final  . ALT 10/07/2012 8  0 - 35 U/L Final  . Alkaline Phosphatase 10/07/2012 96   39 - 117 U/L Final  . Total Bilirubin 10/07/2012 0.5  0.3 - 1.2 mg/dL Final  . GFR calc non Af Amer 10/07/2012 84* >90 mL/min Final  . GFR calc Af Amer 10/07/2012 >90  >90 mL/min Final   Comment: (NOTE)                          The eGFR has been calculated using the CKD EPI equation.                          This calculation has not been validated in all clinical situations.                          eGFR's persistently <90 mL/min signify possible Chronic Kidney                          Disease.  Marland Kitchen Prothrombin Time 10/07/2012 12.4  11.6 - 15.2 seconds Final  . INR 10/07/2012 0.94  0.00 - 1.49 Final  . ABO/RH(D) 10/07/2012 B NEG   Final  . Antibody Screen 10/07/2012 NEG   Final  . Sample Expiration 10/07/2012 10/17/2012   Final  . Color, Urine 10/07/2012 AMBER* YELLOW Final   BIOCHEMICALS MAY BE AFFECTED BY COLOR  . APPearance 10/07/2012 CLEAR  CLEAR Final  . Specific Gravity, Urine 10/07/2012 1.032* 1.005 - 1.030 Final  . pH 10/07/2012 5.5  5.0 - 8.0 Final  . Glucose, UA 10/07/2012 NEGATIVE  NEGATIVE mg/dL Final  . Hgb urine dipstick 10/07/2012 NEGATIVE  NEGATIVE Final  . Bilirubin Urine 10/07/2012 NEGATIVE  NEGATIVE Final  . Ketones, ur 10/07/2012 NEGATIVE  NEGATIVE mg/dL Final  . Protein, ur 84/69/6295 NEGATIVE  NEGATIVE mg/dL Final  . Urobilinogen, UA 10/07/2012 0.2  0.0 - 1.0 mg/dL Final  . Nitrite 28/41/3244 NEGATIVE  NEGATIVE Final  . Leukocytes, UA 10/07/2012 SMALL* NEGATIVE Final  . MRSA, PCR 10/07/2012 NEGATIVE  NEGATIVE Final  . Staphylococcus aureus 10/07/2012 NEGATIVE  NEGATIVE Final   Comment:                                 The Xpert SA Assay (FDA                          approved for NASAL specimens                          in patients over  39 years of age),                          is one component of                          a comprehensive surveillance                          program.  Test performance has                          been validated by [employer] for patients greater                          than or equal to 56 year old.                          It is not intended                          to diagnose infection nor to                          guide or monitor treatment.  . Squamous Epithelial / LPF 10/07/2012 RARE  RARE Final  . WBC, UA 10/07/2012 3-6  <3 WBC/hpf Final  . Bacteria, UA 10/07/2012 RARE  RARE Final  . Daryll Drown 10/07/2012 MUCOUS PRESENT   Final  . ABO/RH(D) 10/07/2012 B NEG   Final     X-Rays:No results found.  EKG:No orders found for this or any previous visit.   Hospital Course: Renee [name] is a 72 y.o. who was admitted to [facility]. They were brought to the operating room on 10/14/2012 and underwent Procedure(s): LEFT TOTAL KNEE ARTHROPLASTY.  Patient tolerated the procedure well and was later transferred to the recovery room and then to the orthopaedic floor for postoperative care.  They were given PO and IV analgesics for pain control following their surgery.  They were given 24 hours of postoperative antibiotics of  Anti-infectives   Start     Dose/Rate Route Frequency Ordered Stop   10/14/12 1500  ceFAZolin (ANCEF) IVPB 1 g/50 mL premix     1 g 100 mL/hr over 30 Minutes Intravenous Every 6 hours 10/14/12 1233 10/14/12 2059   10/14/12 0630  ceFAZolin (ANCEF) IVPB 2 g/50 mL premix     2 g 100 mL/hr over 30 Minutes Intravenous On call to O.R. 10/14/12 1610 10/14/12 0926     and started on DVT prophylaxis in the form of Xarelto.   PT and OT were ordered for total joint protocol.  Discharge planning consulted to help with postop disposition and equipment needs.  Patient had a decent night on the evening of surgery with good pain control.  They started to get up OOB with therapy on day one. Hemovac drain was pulled without difficulty.  Continued to work with therapy into day two.  Dressing was changed on day two and the incision was healing well.  Patient was seen in rounds and  was ready to go home.  Discharge Medications: Prior to Admission medications   Medication Sig Start Date End Date Taking? Authorizing Provider  DULoxetine (CYMBALTA) 60 MG capsule Take 60 mg by mouth every evening.   Yes Historical Provider, MD  ezetimibe (ZETIA) 10 MG tablet Take 10 mg by mouth every evening.    Yes Historical Provider, MD  losartan-hydrochlorothiazide (HYZAAR) 100-25 MG per tablet Take 0.5 tablets by mouth every morning.    Yes Historical Provider, MD  rosuvastatin (CRESTOR) 10 MG tablet Take 10 mg by mouth every evening.    Yes Historical Provider, MD  HYDROmorphone (DILAUDID) 2 MG tablet Take 1-2 tablets (2-4 mg total) by mouth every 3 (three) hours as needed. 10/16/12   Lanique Gonzalo Julien Girt, PA-C  methocarbamol (ROBAXIN) 500 MG tablet Take 1 tablet (500 mg total) by mouth every 6 (six) hours as needed. 10/16/12   Jihaad Bruschi Julien Girt, PA-C  promethazine (PHENERGAN) 25 MG tablet Take 0.5-1 tablets (12.5-25 mg total) by mouth every 6 (six) hours as needed for nausea. 10/16/12   Charm Stenner Julien Girt, PA-C  rivaroxaban (XARELTO) 10 MG TABS tablet Take 1 tablet (10 mg total) by mouth daily with breakfast. Take Xarelto for two and a half more weeks, then discontinue Xarelto. Once the patient has completed the Xarelto, they may resume the 81 mg Aspirin. 10/16/12   Johan Creveling Julien Girt, PA-C    Diet: Cardiac diet Activity:WBAT Follow-up:in 2 weeks Disposition - Home Discharged Condition: good       Discharge Orders   Future Appointments Provider Department Dept Phone   11/04/2012 11:00 AM Bella Kennedy, PT [facility] [phone]   Future Orders Complete By Expires   Call MD / Call 911  As directed    Comments:     If you experience chest pain or shortness of breath, CALL 911 and be transported to the hospital emergency room.  If you develope a fever above 101 F, pus (white drainage) or increased drainage or redness at the wound, or calf pain, call  your surgeon's office.   Change dressing  As directed    Comments:     Change dressing daily with sterile 4 x 4 inch gauze dressing and apply TED hose. Do not submerge the incision under water.   Constipation Prevention  As directed    Comments:     Drink plenty of fluids.  Prune juice may be helpful.  You may use a stool softener, such as Colace (over the counter) 100 mg twice a day.  Use MiraLax (over the counter) for constipation as needed.   Diet - low sodium heart healthy  As directed    Discharge instructions  As directed    Comments:     Pick up stool softner and laxative for home. Do not submerge incision under water. May shower. Continue to use ice for pain and swelling from surgery. Take Xarelto for two and a half more weeks, then discontinue Xarelto. Once the patient has completed the Xarelto, they may resume the 81 mg Aspirin.   Do not put a pillow under the knee. Place it under the heel.  As directed    Do not sit on low chairs, stoools or toilet seats, as it may be difficult to get up from low surfaces  As directed    Driving restrictions  As directed    Comments:     No driving until released by the physician.   Increase activity slowly as tolerated  As directed    Lifting restrictions  As directed  Comments:     No lifting until released by the physician.   Patient may shower  As directed    Comments:     You may shower without a dressing once there is no drainage.  Do not wash over the wound.  If drainage remains, do not shower until drainage stops.   TED hose  As directed    Comments:     Use stockings (TED hose) for 3 weeks on both leg(s).  You may remove them at night for sleeping.   Weight bearing as tolerated  As directed        Medication List    STOP taking these medications       aspirin 81 MG EC tablet     cholecalciferol 1000 UNITS tablet  Commonly known as:  VITAMIN D     Co Q 10 100 MG Caps     fish oil-omega-3 fatty acids 1000 MG capsule       multivitamin with minerals tablet     oxyCODONE-acetaminophen 10-325 MG per tablet  Commonly known as:  PERCOCET     vitamin C 1000 MG tablet      TAKE these medications       DULoxetine 60 MG capsule  Commonly known as:  CYMBALTA  Take 60 mg by mouth every evening.     ezetimibe 10 MG tablet  Commonly known as:  ZETIA  Take 10 mg by mouth every evening.     HYDROmorphone 2 MG tablet  Commonly known as:  DILAUDID  Take 1-2 tablets (2-4 mg total) by mouth every 3 (three) hours as needed.     losartan-hydrochlorothiazide 100-25 MG per tablet  Commonly known as:  HYZAAR  Take 0.5 tablets by mouth every morning.     methocarbamol 500 MG tablet  Commonly known as:  ROBAXIN  Take 1 tablet (500 mg total) by mouth every 6 (six) hours as needed.     promethazine 25 MG tablet  Commonly known as:  PHENERGAN  Take 0.5-1 tablets (12.5-25 mg total) by mouth every 6 (six) hours as needed for nausea.     rivaroxaban 10 MG Tabs tablet  Commonly known as:  XARELTO  - Take 1 tablet (10 mg total) by mouth daily with breakfast. Take Xarelto for two and a half more weeks, then discontinue Xarelto.  - Once the patient has completed the Xarelto, they may resume the 81 mg Aspirin.     rosuvastatin 10 MG tablet  Commonly known as:  CRESTOR  Take 10 mg by mouth every evening.       Follow-up Information   Follow up with Loanne Drilling, MD. Schedule an appointment as soon as possible for a visit in 2 weeks.   Specialty:  Orthopedic Surgery   Contact information:   [address] [place] [address] [phone]       Signed: Patrica Duel 10/30/2012, 9:12 PM

## 2012-10-17 DIAGNOSIS — M159 Polyosteoarthritis, unspecified: Secondary | ICD-10-CM | POA: Diagnosis not present

## 2012-10-17 DIAGNOSIS — I10 Essential (primary) hypertension: Secondary | ICD-10-CM | POA: Diagnosis not present

## 2012-10-17 DIAGNOSIS — J449 Chronic obstructive pulmonary disease, unspecified: Secondary | ICD-10-CM | POA: Diagnosis not present

## 2012-10-17 DIAGNOSIS — M810 Age-related osteoporosis without current pathological fracture: Secondary | ICD-10-CM | POA: Diagnosis not present

## 2012-10-17 DIAGNOSIS — IMO0001 Reserved for inherently not codable concepts without codable children: Secondary | ICD-10-CM | POA: Diagnosis not present

## 2012-10-17 DIAGNOSIS — Z96659 Presence of unspecified artificial knee joint: Secondary | ICD-10-CM | POA: Diagnosis not present

## 2012-10-17 DIAGNOSIS — Z471 Aftercare following joint replacement surgery: Secondary | ICD-10-CM | POA: Diagnosis not present

## 2012-10-18 DIAGNOSIS — M159 Polyosteoarthritis, unspecified: Secondary | ICD-10-CM | POA: Diagnosis not present

## 2012-10-18 DIAGNOSIS — M810 Age-related osteoporosis without current pathological fracture: Secondary | ICD-10-CM | POA: Diagnosis not present

## 2012-10-18 DIAGNOSIS — Z471 Aftercare following joint replacement surgery: Secondary | ICD-10-CM | POA: Diagnosis not present

## 2012-10-18 DIAGNOSIS — I10 Essential (primary) hypertension: Secondary | ICD-10-CM | POA: Diagnosis not present

## 2012-10-18 DIAGNOSIS — IMO0001 Reserved for inherently not codable concepts without codable children: Secondary | ICD-10-CM | POA: Diagnosis not present

## 2012-10-21 DIAGNOSIS — I10 Essential (primary) hypertension: Secondary | ICD-10-CM | POA: Diagnosis not present

## 2012-10-21 DIAGNOSIS — Z471 Aftercare following joint replacement surgery: Secondary | ICD-10-CM | POA: Diagnosis not present

## 2012-10-21 DIAGNOSIS — M159 Polyosteoarthritis, unspecified: Secondary | ICD-10-CM | POA: Diagnosis not present

## 2012-10-21 DIAGNOSIS — IMO0001 Reserved for inherently not codable concepts without codable children: Secondary | ICD-10-CM | POA: Diagnosis not present

## 2012-10-21 DIAGNOSIS — M810 Age-related osteoporosis without current pathological fracture: Secondary | ICD-10-CM | POA: Diagnosis not present

## 2012-10-23 DIAGNOSIS — IMO0001 Reserved for inherently not codable concepts without codable children: Secondary | ICD-10-CM | POA: Diagnosis not present

## 2012-10-23 DIAGNOSIS — M810 Age-related osteoporosis without current pathological fracture: Secondary | ICD-10-CM | POA: Diagnosis not present

## 2012-10-23 DIAGNOSIS — I10 Essential (primary) hypertension: Secondary | ICD-10-CM | POA: Diagnosis not present

## 2012-10-23 DIAGNOSIS — Z471 Aftercare following joint replacement surgery: Secondary | ICD-10-CM | POA: Diagnosis not present

## 2012-10-23 DIAGNOSIS — M159 Polyosteoarthritis, unspecified: Secondary | ICD-10-CM | POA: Diagnosis not present

## 2012-10-24 DIAGNOSIS — M159 Polyosteoarthritis, unspecified: Secondary | ICD-10-CM | POA: Diagnosis not present

## 2012-10-24 DIAGNOSIS — IMO0001 Reserved for inherently not codable concepts without codable children: Secondary | ICD-10-CM | POA: Diagnosis not present

## 2012-10-24 DIAGNOSIS — M810 Age-related osteoporosis without current pathological fracture: Secondary | ICD-10-CM | POA: Diagnosis not present

## 2012-10-24 DIAGNOSIS — Z471 Aftercare following joint replacement surgery: Secondary | ICD-10-CM | POA: Diagnosis not present

## 2012-10-24 DIAGNOSIS — I10 Essential (primary) hypertension: Secondary | ICD-10-CM | POA: Diagnosis not present

## 2012-10-25 DIAGNOSIS — M810 Age-related osteoporosis without current pathological fracture: Secondary | ICD-10-CM | POA: Diagnosis not present

## 2012-10-25 DIAGNOSIS — I10 Essential (primary) hypertension: Secondary | ICD-10-CM | POA: Diagnosis not present

## 2012-10-25 DIAGNOSIS — M159 Polyosteoarthritis, unspecified: Secondary | ICD-10-CM | POA: Diagnosis not present

## 2012-10-25 DIAGNOSIS — Z471 Aftercare following joint replacement surgery: Secondary | ICD-10-CM | POA: Diagnosis not present

## 2012-10-25 DIAGNOSIS — IMO0001 Reserved for inherently not codable concepts without codable children: Secondary | ICD-10-CM | POA: Diagnosis not present

## 2012-10-28 DIAGNOSIS — N390 Urinary tract infection, site not specified: Secondary | ICD-10-CM | POA: Diagnosis not present

## 2012-10-28 DIAGNOSIS — Z0000 Encounter for general adult medical examination without abnormal findings: Secondary | ICD-10-CM | POA: Diagnosis not present

## 2012-10-28 DIAGNOSIS — E119 Type 2 diabetes mellitus without complications: Secondary | ICD-10-CM | POA: Diagnosis not present

## 2012-10-28 DIAGNOSIS — E7800 Pure hypercholesterolemia, unspecified: Secondary | ICD-10-CM | POA: Diagnosis not present

## 2012-10-28 DIAGNOSIS — I10 Essential (primary) hypertension: Secondary | ICD-10-CM | POA: Diagnosis not present

## 2012-10-29 DIAGNOSIS — M810 Age-related osteoporosis without current pathological fracture: Secondary | ICD-10-CM | POA: Diagnosis not present

## 2012-10-29 DIAGNOSIS — Z471 Aftercare following joint replacement surgery: Secondary | ICD-10-CM | POA: Diagnosis not present

## 2012-10-29 DIAGNOSIS — I10 Essential (primary) hypertension: Secondary | ICD-10-CM | POA: Diagnosis not present

## 2012-10-29 DIAGNOSIS — M159 Polyosteoarthritis, unspecified: Secondary | ICD-10-CM | POA: Diagnosis not present

## 2012-10-29 DIAGNOSIS — IMO0001 Reserved for inherently not codable concepts without codable children: Secondary | ICD-10-CM | POA: Diagnosis not present

## 2012-10-30 DIAGNOSIS — Z471 Aftercare following joint replacement surgery: Secondary | ICD-10-CM | POA: Diagnosis not present

## 2012-10-30 DIAGNOSIS — I10 Essential (primary) hypertension: Secondary | ICD-10-CM | POA: Diagnosis not present

## 2012-10-30 DIAGNOSIS — IMO0001 Reserved for inherently not codable concepts without codable children: Secondary | ICD-10-CM | POA: Diagnosis not present

## 2012-10-30 DIAGNOSIS — M810 Age-related osteoporosis without current pathological fracture: Secondary | ICD-10-CM | POA: Diagnosis not present

## 2012-10-30 DIAGNOSIS — M159 Polyosteoarthritis, unspecified: Secondary | ICD-10-CM | POA: Diagnosis not present

## 2012-11-01 DIAGNOSIS — IMO0001 Reserved for inherently not codable concepts without codable children: Secondary | ICD-10-CM | POA: Diagnosis not present

## 2012-11-01 DIAGNOSIS — M810 Age-related osteoporosis without current pathological fracture: Secondary | ICD-10-CM | POA: Diagnosis not present

## 2012-11-01 DIAGNOSIS — I10 Essential (primary) hypertension: Secondary | ICD-10-CM | POA: Diagnosis not present

## 2012-11-01 DIAGNOSIS — Z471 Aftercare following joint replacement surgery: Secondary | ICD-10-CM | POA: Diagnosis not present

## 2012-11-01 DIAGNOSIS — M159 Polyosteoarthritis, unspecified: Secondary | ICD-10-CM | POA: Diagnosis not present

## 2012-11-04 ENCOUNTER — Ambulatory Visit (HOSPITAL_COMMUNITY)
Admission: RE | Admit: 2012-11-04 | Discharge: 2012-11-04 | Disposition: A | Discharge status: Home / Self Care | Payer: Medicare Other | Source: Ambulatory Visit | Attending: Orthopedic Surgery | Admitting: Orthopedic Surgery

## 2012-11-04 DIAGNOSIS — M25562 Pain in left knee: Secondary | ICD-10-CM | POA: Insufficient documentation

## 2012-11-04 DIAGNOSIS — IMO0001 Reserved for inherently not codable concepts without codable children: Secondary | ICD-10-CM | POA: Insufficient documentation

## 2012-11-04 DIAGNOSIS — M25569 Pain in unspecified knee: Secondary | ICD-10-CM | POA: Diagnosis not present

## 2012-11-04 DIAGNOSIS — M25552 Pain in left hip: Secondary | ICD-10-CM | POA: Insufficient documentation

## 2012-11-04 DIAGNOSIS — M25669 Stiffness of unspecified knee, not elsewhere classified: Secondary | ICD-10-CM | POA: Insufficient documentation

## 2012-11-04 DIAGNOSIS — R0609 Other forms of dyspnea: Secondary | ICD-10-CM | POA: Diagnosis not present

## 2012-11-04 DIAGNOSIS — R0989 Other specified symptoms and signs involving the circulatory and respiratory systems: Secondary | ICD-10-CM | POA: Diagnosis not present

## 2012-11-04 DIAGNOSIS — E119 Type 2 diabetes mellitus without complications: Secondary | ICD-10-CM | POA: Diagnosis not present

## 2012-11-04 DIAGNOSIS — R262 Difficulty in walking, not elsewhere classified: Secondary | ICD-10-CM | POA: Insufficient documentation

## 2012-11-04 DIAGNOSIS — Z23 Encounter for immunization: Secondary | ICD-10-CM | POA: Diagnosis not present

## 2012-11-04 DIAGNOSIS — E7800 Pure hypercholesterolemia, unspecified: Secondary | ICD-10-CM | POA: Diagnosis not present

## 2012-11-04 DIAGNOSIS — I10 Essential (primary) hypertension: Secondary | ICD-10-CM | POA: Diagnosis not present

## 2012-11-04 DIAGNOSIS — M1990 Unspecified osteoarthritis, unspecified site: Secondary | ICD-10-CM | POA: Diagnosis not present

## 2012-11-04 DIAGNOSIS — M25662 Stiffness of left knee, not elsewhere classified: Secondary | ICD-10-CM | POA: Insufficient documentation

## 2012-11-04 NOTE — Evaluation (Signed)
Physical Therapy Evaluation  Patient Details  Name: Renee [name] MRN: [id number] Date of Birth: Mar 13, 1940  Today's Date: 11/04/2012 Time: 1121-1205 PT Time Calculation (min): 44 min Charges:  1 evaluation 1 ice 10' Manual              Visit#: 1 of 12  Re-eval: 12/04/12 Assessment Diagnosis: Lt TKR Surgical Date: 10/14/12 Next MD Visit: Dr. Despina Hick - Oct 21st Prior Therapy: HHPT - 3x/week 2 weeks  Authorization: Medicare    Authorization Time Period:    Authorization Visit#: 1 of 10   Past Medical History:  Past Medical History  Diagnosis Date  . Blood transfusion 1948  . Cataract     both eyes  . Hyperlipidemia   . Hypertension   . Fibromyalgia   . Arthritis     oa  . DVT (deep venous thrombosis) 2002    left knee  . GERD (gastroesophageal reflux disease)   . Diverticulosis   . Measles as child  . Mumps age 75  . COPD (chronic obstructive pulmonary disease)   . Breast cancer 1994    bilateral  . Disc disease, degenerative, cervical     trouble turing head and neck at times  . PONV (postoperative nausea and vomiting)    Past Surgical History:  Past Surgical History  Procedure Laterality Date  . Colonoscopy    . Polypectomy    . Partial hysterectomy    . Mastectomy      bilateral  . Knee surgery  2002    arthroscopy-bilateral  . Abdominal hysterectomy  1976    partial  . Back surgery  1976    lower  . Bilateral mastectomy with tramflap reconstruction  1994  . Knee arthroscopy Right 2008  . Achilles tendon adn 2 bone spurs removed Left 05-2012  . Total knee arthroplasty Left 10/14/2012    Procedure: LEFT TOTAL KNEE ARTHROPLASTY;  Surgeon: Loanne Drilling, MD;  Location: [facility];  Service: Orthopedics;  Laterality: Left;    Subjective Symptoms/Limitations Symptoms: Pt is a 72 year old female referred to PT for Lt TKR on 10/14/12. Before her TKR she had a knee scope several years ago.  She had injections to help with her pain, she had outpatient  therapy all without signficant help and therefore had the Lt TKR.   How long can you stand comfortably?: 20 minutes  How long can you walk comfortably?: 15 minutes w/SPC Patient Stated Goals: no pain medication, walking without cane, walking outdoors Pain Assessment Currently in Pain?: Yes Pain Score: 5  Pain Location: Knee Pain Orientation: Left Pain Type: Acute pain;Surgical pain Pain Relieving Factors: ice, pain medication (every 4 hours),  Precautions/Restrictions  Precautions Precautions: Knee Precaution Comments: Hx of Cancer Restrictions Weight Bearing Restrictions: No LLE Weight Bearing: Weight bearing as tolerated  Balance Screening Balance Screen Has the patient fallen in the past 6 months: No Has the patient had a decrease in activity level because of a fear of falling? : Yes Is the patient reluctant to leave their home because of a fear of falling? : No  Prior Functioning     Cognition/Observation Observation/Other Assessments Observations: swelling to Lt knee Other Assessments: inscision is healing well  Sensation/Coordination/Flexibility/Functional Tests Functional Tests Functional Tests: FOTO: 32/68  Assessment LLE AROM (degrees) Left Knee Extension: 6 Left Knee Flexion: 84 LLE PROM (degrees) Left Knee Extension: 3 Left Knee Flexion: 90 LLE Strength Left Hip Flexion: 4/5 Left Hip Extension: 3+/5 Left Hip ABduction:  (4+/5) Left  Hip ADduction: 4/5 Left Knee Flexion: 4/5 Left Knee Extension: 4/5 Palpation Palpation: pain and tenderness to popliteal region.  Max fascial restrictions to Lt knee inscision and distal ITB insertion   Mobility/Balance  Ambulation/Gait Ambulation/Gait: Yes Assistive device: Straight cane Gait Pattern: Decreased stance time - left;Decreased hip/knee flexion - left;Decreased weight shift to left;Left foot flat;Antalgic Static Standing Balance Single Leg Stance - Right Leg: 10 Single Leg Stance - Left Leg: 5 Tandem  Stance - Right Leg: 8 Tandem Stance - Left Leg: 5 Rhomberg - Eyes Opened: 10 Rhomberg - Eyes Closed: 10   Exercise/Treatments Supine Quad Sets: Left;5 reps Heel Slides: Left;5 reps Prone  Hamstring Curl: 10 reps;Limitations Hamstring Curl Limitations: AAROM w/5 sec holds Hip Extension: 10 reps   Modalities Modalities: Cryotherapy Manual Therapy Manual Therapy: Edema management Edema Management: to Lt knee to decrease swelling. Cryotherapy Number Minutes Cryotherapy: 10 Minutes Cryotherapy Location: Knee Type of Cryotherapy: Ice pack  Physical Therapy Assessment and Plan PT Assessment and Plan Clinical Impression Statement: Pt is a 72 year old female referred to PT for Lt TKR with impairments listed below.  Pt will benefit from skilled therapeutic intervention in order to improve on the following deficits: Pain;Decreased strength;Decreased range of motion;Abnormal gait;Increased edema;Increased fascial restricitons;Decreased mobility;Difficulty walking;Impaired perceived functional ability;Increased muscle spasms Rehab Potential: Good PT Frequency: Min 3X/week PT Duration: 8 weeks PT Treatment/Interventions: DME instruction;Gait training;Stair training;Functional mobility training;Therapeutic activities;Therapeutic exercise;Patient/family education;Manual techniques;Balance training;Neuromuscular re-education PT Plan: Focus on improving knee A/PROM to improve knee flexion and extension.  Educate on HEP: 4 way SLR, LAQ's, SAQ's, heel slides, quad sets, prone TKE, sqauts, heel/toe raises, SLS.  3rd visit introduce bike for ROM and continue with exercises to gait ROM and strength,  Biodex if needed for PROM.     Goals Home Exercise Program Pt/caregiver will Perform Home Exercise Program: Independently PT Goal: Perform Home Exercise Program - Progress: Goal set today PT Short Term Goals Time to Complete Short Term Goals: 4 weeks PT Short Term Goal 1: Pt will decrease her fascial  restrictions to minimal to Lt knee in order to present with knee AROM 0-100 degress for greater ease with sit to stand.  PT Short Term Goal 2: Pt will improve her proprioceptive awareness and demosntrate Lt SLS x15 sec on solid surface in order to ambulate independently in indoor and outdoor environments safely.  PT Short Term Goal 3: Pt will improve her proprioceptive awareness and ambualte with minimal gait abnormalities to decrease risk of seocndary injuries.  PT Short Term Goal 4: Pt will decrease fascial restrictions in order to decrease need of pain medication.  PT Long Term Goals Time to Complete Long Term Goals: 8 weeks PT Long Term Goal 1: Pt will improve her Lt leg strength to WNL in order to ambulate independently with minimal gait impairments fo 1 hour to continue with her outdoor hiking activities.  PT Long Term Goal 2: Pt will improve her Lt knee AROM 0-110 degrees to have greater ease with ascending and descending 10 steps in order to safely enter community dwellings.  Long Term Goal 3: Pt will improve her Lt leg functional strength to [facility] in order to ascend and descend 10 stairs without handrail with reciprocal pattern. Long Term Goal 4: Pt will improve her FOTO score to Status greater than 48% and Limitiation less than 52% for improved percieved functional ability.   Problem List Patient Active Problem List   Diagnosis Date Noted  . Difficulty in walking(719.7) 11/04/2012  .  Stiffness of left knee 11/04/2012  . Left knee pain 11/04/2012  . Postoperative anemia due to acute blood loss 10/15/2012  . GERD (gastroesophageal reflux disease) 10/15/2012  . Unspecified essential hypertension 10/15/2012  . Hyperlipidemia 10/15/2012  . OA (osteoarthritis) of knee 10/14/2012  . Cough 02/14/2012  . Dyspnea 02/09/2012  . Chest pain 02/09/2012    PT - End of Session Activity Tolerance: Patient tolerated treatment well PT Plan of Care PT Home Exercise Plan: given PT Patient  Instructions: importance of HEP Consulted and Agree with Plan of Care: Patient  GP Functional Assessment Tool Used: FOTO: Status: 32%, Limiation: 68% Functional Limitation: Mobility: Walking and moving around Mobility: Walking and Moving Around Current Status (B1478): At least 60 percent but less than 80 percent impaired, limited or restricted Mobility: Walking and Moving Around Goal Status [phone]): At least 40 percent but less than 60 percent impaired, limited or restricted  Edithe Dobbin, MPT, ATC 11/04/2012, 12:30 PM  Physician Documentation Your signature is required to indicate approval of the treatment plan as stated above.  Please sign and either send electronically or make a copy of this report for your files and return this physician signed original.   Please mark one 1.__approve of plan  2. ___approve of plan with the following conditions.   ______________________________                                                          _____________________ Physician Signature                                                                                                             Date

## 2012-11-05 DIAGNOSIS — R000 Tachycardia, unspecified: Secondary | ICD-10-CM | POA: Diagnosis not present

## 2012-11-05 DIAGNOSIS — I825Y9 Chronic embolism and thrombosis of unspecified deep veins of unspecified proximal lower extremity: Secondary | ICD-10-CM | POA: Diagnosis not present

## 2012-11-06 ENCOUNTER — Telehealth (HOSPITAL_COMMUNITY): Payer: Self-pay

## 2012-11-06 ENCOUNTER — Ambulatory Visit (HOSPITAL_COMMUNITY): Payer: Medicare Other | Admitting: *Deleted

## 2012-11-11 ENCOUNTER — Ambulatory Visit (HOSPITAL_COMMUNITY)
Admission: RE | Admit: 2012-11-11 | Discharge: 2012-11-11 | Disposition: A | Discharge status: Home / Self Care | Payer: Medicare Other | Source: Ambulatory Visit | Attending: Orthopedic Surgery | Admitting: Orthopedic Surgery

## 2012-11-11 NOTE — Progress Notes (Signed)
Physical Therapy Treatment Patient Details  Name: Renee [name] MRN: [id number] Date of Birth: 05-20-1940  Today's Date: 11/11/2012 Time: 1105-1200 PT Time Calculation (min): 55 min Charges: Self care x 12'(1105-1117) Therex x 26'(1120-1146) Ice x 10'(1150-1200)  Visit#: 2 of 12  Re-eval: 12/04/12  Authorization: Medicare  Authorization Visit#: 1 of 10   Subjective: Symptoms/Limitations Symptoms: Pt states that she went to MD 11/05/12 and blood work determined taht she has a blood clot. Pt states that MD cleared her to retrun to therapy today.  Pain Assessment Currently in Pain?: Yes Pain Score: 3  Pain Location: Knee Pain Orientation: Left  Precautions/Restrictions     Exercise/Treatments Seated Long Arc Quad: 10 reps Supine Quad Sets: 10 reps;Left Short Arc Quad Sets: 10 reps;Left Heel Slides: 10 reps;Left Bridges: 2 sets;5 reps;Limitations Bridges Limitations: Increasing knee flexion nwith each rep Prone  Hamstring Curl: 10 reps Hip Extension: 10 reps   Modalities Modalities: Cryotherapy Cryotherapy Number Minutes Cryotherapy: 10 Minutes Cryotherapy Location: Knee (Left) Type of Cryotherapy: Ice massage  Physical Therapy Assessment and Plan PT Assessment and Plan Clinical Impression Statement: Blood pressure and HR measured at beginning of session (HR 100 BP 145/76). Held activities that cause exertion. Treatment focus on improving ROM and strength. Ice applied at end of session to limit pain and inflammation. Pt reports 0/10 pain at end of session. Pt will benefit from skilled therapeutic intervention in order to improve on the following deficits: Pain;Decreased strength;Decreased range of motion;Abnormal gait;Increased edema;Increased fascial restricitons;Decreased mobility;Difficulty walking;Impaired perceived functional ability;Increased muscle spasms Rehab Potential: Good PT Frequency: Min 3X/week PT Duration: 8 weeks PT Treatment/Interventions: DME  instruction;Gait training;Stair training;Functional mobility training;Therapeutic activities;Therapeutic exercise;Patient/family education;Manual techniques;Balance training;Neuromuscular re-education PT Plan: Focus on improving knee A/PROM to improve knee flexion and extension.  Educate on HEP: 4 way SLR, LAQ's, SAQ's, heel slides, quad sets, prone TKE, sqauts, heel/toe raises, SLS.  3rd visit introduce bike for ROM and continue with exercises to gait ROM and strength,  Biodex if needed for PROM. Monitor B/P and HR each session.     Problem List Patient Active Problem List   Diagnosis Date Noted  . Difficulty in walking(719.7) 11/04/2012  . Stiffness of left knee 11/04/2012  . Left knee pain 11/04/2012  . Postoperative anemia due to acute blood loss 10/15/2012  . GERD (gastroesophageal reflux disease) 10/15/2012  . Unspecified essential hypertension 10/15/2012  . Hyperlipidemia 10/15/2012  . OA (osteoarthritis) of knee 10/14/2012  . Cough 02/14/2012  . Dyspnea 02/09/2012  . Chest pain 02/09/2012    PT - End of Session Activity Tolerance: Patient tolerated treatment well General Behavior During Therapy: [facility] for tasks assessed/performed  Seth Bake, PTA  11/11/2012, 12:24 PM

## 2012-11-12 DIAGNOSIS — E119 Type 2 diabetes mellitus without complications: Secondary | ICD-10-CM | POA: Diagnosis not present

## 2012-11-12 DIAGNOSIS — E7800 Pure hypercholesterolemia, unspecified: Secondary | ICD-10-CM | POA: Diagnosis not present

## 2012-11-13 ENCOUNTER — Ambulatory Visit (HOSPITAL_COMMUNITY)
Admission: RE | Admit: 2012-11-13 | Discharge: 2012-11-13 | Disposition: A | Discharge status: Home / Self Care | Payer: Medicare Other | Source: Ambulatory Visit | Attending: Orthopedic Surgery | Admitting: Orthopedic Surgery

## 2012-11-13 DIAGNOSIS — M25562 Pain in left knee: Secondary | ICD-10-CM

## 2012-11-13 DIAGNOSIS — M25662 Stiffness of left knee, not elsewhere classified: Secondary | ICD-10-CM

## 2012-11-13 DIAGNOSIS — R262 Difficulty in walking, not elsewhere classified: Secondary | ICD-10-CM

## 2012-11-13 NOTE — Progress Notes (Signed)
Physical Therapy Treatment Patient Details  Name: Renee [name] MRN: [id number] Date of Birth: [birth year]  Today's Date: 11/13/2012 Time: 4098-1191 PT Time Calculation (min): 42 min Charges:  TE: 1103-1130 Manual: 1130-1145 Visit#: 3 of 12  Re-eval: 12/04/12 Assessment Diagnosis: Lt TKR Surgical Date: 10/14/12 Next MD Visit: Dr. Despina Hick - Oct 21st  Authorization: Medicare  Authorization Time Period:    Authorization Visit#: 3 of 10   Subjective: Symptoms/Limitations Symptoms: Pt reports that she is feeling much better and knows she shoulder be taking two of her medication  Pain Assessment Currently in Pain?: Yes Pain Score: 3  Pain Location: Knee Pain Orientation: Left  Precautions/Restrictions     Exercise/Treatments Stretches Knee: Self-Stretch to increase Flexion: 3 reps;60 seconds Aerobic Stationary Bike: rocking 10 minutes seat 8 for AROM Standing Heel Raises: 15 reps Knee Flexion: Left;15 reps;Limitations Knee Flexion Limitations: 2# Functional Squat: 10 reps;Limitations Functional Squat Limitations: heel raise Manual Therapy Manual Therapy: Other (comment) Edema Management: retro massage to decrease swelling.  Other Manual Therapy: PROM to improve knee extension and flexion   Physical Therapy Assessment and Plan PT Assessment and Plan Clinical Impression Statement: Pt is able to make 1 full backwards rotation on the bike on seat 8 at end of 10 minutes on bike with intense pain.  Added standing activities secondary to calf pain and DVT pain decreased.  Continues to be limited by significant pain and tenderness with mobility.  Initally requires mod-max multimodal cueing for proper distal quadricep activiation and is able to complete indpendently at end of session. Finished session with manual techniques to improve knee flexion and extension in tolerable levels able to achieve Rt knee AROM: 5-86 degrees supine. Rehab Potential: Good PT Frequency: Min  3X/week PT Duration: 8 weeks PT Treatment/Interventions: DME instruction;Gait training;Stair training;Functional mobility training;Therapeutic activities;Therapeutic exercise;Patient/family education;Manual techniques;Balance training;Neuromuscular re-education PT Plan: Focus on improving knee A/PROM to improve knee flexion and extension with TOLERABLE PAIN LEVEL. have pt demonstrate patellar mobs. Biodex if needed for PROM if needed.  Continue to monitor DVT.     Goals Home Exercise Program Pt/caregiver will Perform Home Exercise Program: Independently PT Goal: Perform Home Exercise Program - Progress: Met PT Short Term Goals Time to Complete Short Term Goals: 4 weeks PT Short Term Goal 1: Pt will decrease her fascial restrictions to minimal to Lt knee in order to present with knee AROM 0-100 degress for greater ease with sit to stand.  PT Short Term Goal 1 - Progress: Progressing toward goal PT Short Term Goal 2: Pt will improve her proprioceptive awareness and demosntrate Lt SLS x15 sec on solid surface in order to ambulate independently in indoor and outdoor environments safely.  PT Short Term Goal 3: Pt will improve her proprioceptive awareness and ambualte with minimal gait abnormalities to decrease risk of seocndary injuries.  PT Short Term Goal 3 - Progress: Progressing toward goal PT Short Term Goal 4: Pt will decrease fascial restrictions in order to decrease need of pain medication.  PT Short Term Goal 4 - Progress: Progressing toward goal PT Long Term Goals Time to Complete Long Term Goals: 8 weeks PT Long Term Goal 1: Pt will improve her Lt leg strength to WNL in order to ambulate independently with minimal gait impairments fo 1 hour to continue with her outdoor hiking activities.  PT Long Term Goal 2: Pt will improve her Lt knee AROM 0-110 degrees to have greater ease with ascending and descending 10 steps in order to safely enter community  dwellings.  Long Term Goal 3: Pt will  improve her Lt leg functional strength to [facility] in order to ascend and descend 10 stairs without handrail with reciprocal pattern. Long Term Goal 4: Pt will improve her FOTO score to Status greater than 48% and Limitiation less than 52% for improved percieved functional ability.   Problem List Patient Active Problem List   Diagnosis Date Noted  . Difficulty in walking(719.7) 11/04/2012  . Stiffness of left knee 11/04/2012  . Left knee pain 11/04/2012  . Postoperative anemia due to acute blood loss 10/15/2012  . GERD (gastroesophageal reflux disease) 10/15/2012  . Unspecified essential hypertension 10/15/2012  . Hyperlipidemia 10/15/2012  . OA (osteoarthritis) of knee 10/14/2012  . Cough 02/14/2012  . Dyspnea 02/09/2012  . Chest pain 02/09/2012    PT - End of Session Activity Tolerance: Patient tolerated treatment well General Behavior During Therapy: [facility] for tasks assessed/performed PT Plan of Care PT Patient Instructions: importance of quad sets Consulted and Agree with Plan of Care: Patient  Anaisabel Pederson, MPT, ATC 11/13/2012, 12:07 PM

## 2012-11-15 ENCOUNTER — Ambulatory Visit (HOSPITAL_COMMUNITY)
Admission: RE | Admit: 2012-11-15 | Discharge: 2012-11-15 | Disposition: A | Discharge status: Home / Self Care | Payer: Medicare Other | Source: Ambulatory Visit | Attending: Internal Medicine | Admitting: Internal Medicine

## 2012-11-15 DIAGNOSIS — M25562 Pain in left knee: Secondary | ICD-10-CM

## 2012-11-15 DIAGNOSIS — M25662 Stiffness of left knee, not elsewhere classified: Secondary | ICD-10-CM

## 2012-11-15 DIAGNOSIS — R262 Difficulty in walking, not elsewhere classified: Secondary | ICD-10-CM

## 2012-11-15 NOTE — Progress Notes (Signed)
Physical Therapy Treatment Patient Details  Name: Renee [name] MRN: [id number] Date of Birth: [birth year]  Today's Date: 11/15/2012 Time: 4098-1191 PT Time Calculation (min): 82 min Charge: TE 1048-1130, 1150-1200, Manual 1130-1150, Ice 1200-1210  Visit#: 4 of 12  Re-eval: 12/04/12 Assessment Diagnosis: Lt TKR Surgical Date: 10/14/12 Next MD Visit: Dr. Despina Hick - Oct 21st Prior Therapy: HHPT - 3x/week 2 weeks  Authorization: Medicare  Authorization Time Period:    Authorization Visit#: 4 of 10   Subjective: Symptoms/Limitations Symptoms: Pt stated she had a rough night sleep last night, overall Lt LE pain scale 3/10. Pain Assessment Currently in Pain?: Yes Pain Score: 3  Pain Location: Leg Pain Orientation: Left  Precautions/Restrictions  Precautions Precautions: Knee Precaution Comments: Hx of Cancer  Exercise/Treatments Aerobic Stationary Bike: rocking 10 minutes seat 8 for AROM Isokinetic: Biodex PROM 2-90 degrees  Standing Heel Raises: 15 reps Knee Flexion: Left;15 reps;Limitations Knee Flexion Limitations: 2# Supine Quad Sets: 10 reps;Left Terminal Knee Extension: 10 reps   Modalities Modalities: Cryotherapy Manual Therapy Manual Therapy: Joint mobilization Edema Management: retro massage to decrease swelling Joint Mobilization: Patella mobs all directions, tib/fib mobs Other Manual Therapy: PROM to improve knee extension and flexion; scar tissue massage to reduce adhesions  Cryotherapy Number Minutes Cryotherapy: 10 Minutes Cryotherapy Location: Knee Type of Cryotherapy: Ice pack  Physical Therapy Assessment and Plan PT Assessment and Plan Clinical Impression Statement: Seession focus on improving AROM and manual techniques to reduce edema, improve patella mobility and improve knee flexion and extension.  Began Biodex PROM with ROM 2-92 degrees.  Pt initially required mod multimodal cueing to improve distal quadriceps activation, able to complete  independently following cues.  Pt educated on benefits of compression hose, referral sent to MD for thigh high compression hose for edema control.  Ended session with ice and elevation for pain and edema control.  Pt stated soreness on distal quadriceps, pain scale 1/10.  PT Plan: Focus on improving knee A/PROM to improve knee flexion and extension with TOLERABLE PAIN LEVEL. have pt demonstrate patellar mobs. Biodex if needed for PROM if needed.  Continue to monitor DVT.   Complete progress note for MD apt on 11/19/2012    Goals Home Exercise Program Pt/caregiver will Perform Home Exercise Program: Independently PT Short Term Goals Time to Complete Short Term Goals: 4 weeks PT Short Term Goal 1: Pt will decrease her fascial restrictions to minimal to Lt knee in order to present with knee AROM 0-100 degress for greater ease with sit to stand.  PT Short Term Goal 1 - Progress: Progressing toward goal PT Short Term Goal 2: Pt will improve her proprioceptive awareness and demosntrate Lt SLS x15 sec on solid surface in order to ambulate independently in indoor and outdoor environments safely.  PT Short Term Goal 3: Pt will improve her proprioceptive awareness and ambualte with minimal gait abnormalities to decrease risk of seocndary injuries.  PT Short Term Goal 4: Pt will decrease fascial restrictions in order to decrease need of pain medication.  PT Short Term Goal 4 - Progress: Progressing toward goal PT Long Term Goals Time to Complete Long Term Goals: 8 weeks PT Long Term Goal 1: Pt will improve her Lt leg strength to WNL in order to ambulate independently with minimal gait impairments fo 1 hour to continue with her outdoor hiking activities.  PT Long Term Goal 1 - Progress: Progressing toward goal PT Long Term Goal 2: Pt will improve her Lt knee AROM 0-110 degrees to have  greater ease with ascending and descending 10 steps in order to safely enter community dwellings.  PT Long Term Goal 2 -  Progress: Progressing toward goal Long Term Goal 3: Pt will improve her Lt leg functional strength to [facility] in order to ascend and descend 10 stairs without handrail with reciprocal pattern. Long Term Goal 4: Pt will improve her FOTO score to Status greater than 48% and Limitiation less than 52% for improved percieved functional ability.   Problem List Patient Active Problem List   Diagnosis Date Noted  . Difficulty in walking(719.7) 11/04/2012  . Stiffness of left knee 11/04/2012  . Left knee pain 11/04/2012  . Postoperative anemia due to acute blood loss 10/15/2012  . GERD (gastroesophageal reflux disease) 10/15/2012  . Unspecified essential hypertension 10/15/2012  . Hyperlipidemia 10/15/2012  . OA (osteoarthritis) of knee 10/14/2012  . Cough 02/14/2012  . Dyspnea 02/09/2012  . Chest pain 02/09/2012    PT - End of Session Activity Tolerance: Patient tolerated treatment well General Behavior During Therapy: [facility] for tasks assessed/performed  GP    Juel Burrow 11/15/2012, 12:22 PM

## 2012-11-18 ENCOUNTER — Ambulatory Visit (HOSPITAL_COMMUNITY)
Admission: RE | Admit: 2012-11-18 | Discharge: 2012-11-18 | Disposition: A | Discharge status: Home / Self Care | Payer: Medicare Other | Source: Ambulatory Visit | Attending: Orthopedic Surgery | Admitting: Orthopedic Surgery

## 2012-11-18 ENCOUNTER — Other Ambulatory Visit (HOSPITAL_COMMUNITY): Payer: Self-pay | Admitting: Internal Medicine

## 2012-11-18 ENCOUNTER — Other Ambulatory Visit: Payer: Self-pay | Admitting: Internal Medicine

## 2012-11-18 ENCOUNTER — Ambulatory Visit (HOSPITAL_COMMUNITY)
Admission: RE | Admit: 2012-11-18 | Discharge: 2012-11-18 | Disposition: A | Discharge status: Home / Self Care | Payer: Medicare Other | Source: Ambulatory Visit | Attending: Internal Medicine | Admitting: Internal Medicine

## 2012-11-18 DIAGNOSIS — R52 Pain, unspecified: Secondary | ICD-10-CM

## 2012-11-18 DIAGNOSIS — M25562 Pain in left knee: Secondary | ICD-10-CM

## 2012-11-18 DIAGNOSIS — I825Y2 Chronic embolism and thrombosis of unspecified deep veins of left proximal lower extremity: Secondary | ICD-10-CM

## 2012-11-18 DIAGNOSIS — Z7901 Long term (current) use of anticoagulants: Secondary | ICD-10-CM | POA: Diagnosis not present

## 2012-11-18 DIAGNOSIS — M1710 Unilateral primary osteoarthritis, unspecified knee: Secondary | ICD-10-CM | POA: Diagnosis not present

## 2012-11-18 DIAGNOSIS — I825Y9 Chronic embolism and thrombosis of unspecified deep veins of unspecified proximal lower extremity: Secondary | ICD-10-CM | POA: Diagnosis not present

## 2012-11-18 DIAGNOSIS — M25569 Pain in unspecified knee: Secondary | ICD-10-CM | POA: Diagnosis not present

## 2012-11-18 DIAGNOSIS — R599 Enlarged lymph nodes, unspecified: Secondary | ICD-10-CM | POA: Diagnosis not present

## 2012-11-18 DIAGNOSIS — M79609 Pain in unspecified limb: Secondary | ICD-10-CM | POA: Diagnosis not present

## 2012-11-18 DIAGNOSIS — R609 Edema, unspecified: Secondary | ICD-10-CM

## 2012-11-18 DIAGNOSIS — I498 Other specified cardiac arrhythmias: Secondary | ICD-10-CM | POA: Diagnosis not present

## 2012-11-18 NOTE — Progress Notes (Signed)
Physical Therapy Treatment Patient Details  Name: Renee [name] MRN: [id number] Date of Birth: 1940-08-25  Today's Date: 11/18/2012 Time: 1100-1150 PT Time Calculation (min): 50 min Charges: Therex x 18'(11-1116) Manual x 20'(1119-1139) Ice x 10'(1140-1150)  Visit#: 5 of 12  Re-eval: 12/04/12  Authorization: Medicare  Authorization Visit#: 5 of 10   Subjective: Symptoms/Limitations Symptoms: Pt states that her knee continues to be sore and tight. Pain Assessment Currently in Pain?: Yes Pain Score: 3  Pain Location: Knee Pain Orientation: Left  Assessment LLE AROM (degrees) Left Knee Extension: 3 (was 5 (11/13/12)) Left Knee Flexion: 90 (was 86 (1015/14)) LLE PROM (degrees) Left Knee Extension: 0 (was 2 (1017/14)) Left Knee Flexion: 98 (was 92 (1017/14))   Exercise/Treatments Aerobic Stationary Bike: rocking 10 minutes seat 8 for AROM Isokinetic: Biodex PROM 2-90 degrees  Supine Knee Extension: PROM Knee Flexion: PROM   Manual Therapy Joint Mobilization: Grade II-III a/p mobs to improve motion/decrease pain Other Manual Therapy: PROM to improve knee extension and flexion Cryotherapy Number Minutes Cryotherapy: 10 Minutes Cryotherapy Location: Knee (Left) Type of Cryotherapy: Ice pack  Physical Therapy Assessment and Plan PT Assessment and Plan Clinical Impression Statement: Pt displays improve ROM this session. ROM continues to be limited by pain. Pt yells out in pain when PROM completed for flexion but end feel is not hard. Ice applied at end of session to limit pain and inflammation. Pt reports pain decrease to 2/10 at end of session.  PT Plan: Focus on improving knee A/PROM to improve knee flexion and extension with TOLERABLE PAIN LEVEL. have pt demonstrate patellar mobs. Biodex if needed for PROM if needed.  Continue to monitor DVT.   Complete progress note for MD apt on 11/19/2012     Problem List Patient Active Problem List   Diagnosis Date Noted  .  Difficulty in walking(719.7) 11/04/2012  . Stiffness of left knee 11/04/2012  . Left knee pain 11/04/2012  . Postoperative anemia due to acute blood loss 10/15/2012  . GERD (gastroesophageal reflux disease) 10/15/2012  . Unspecified essential hypertension 10/15/2012  . Hyperlipidemia 10/15/2012  . OA (osteoarthritis) of knee 10/14/2012  . Cough 02/14/2012  . Dyspnea 02/09/2012  . Chest pain 02/09/2012    PT - End of Session Activity Tolerance: Patient tolerated treatment well General Behavior During Therapy: [facility] for tasks assessed/performed  Seth Bake, PTA 11/18/2012, 12:35 PM

## 2012-11-18 NOTE — Progress Notes (Signed)
Left lower extremity venous duplex completed.  Left:  No evidence of DVT, superficial thrombosis, or Baker's cyst.  Right:  Negative for DVT in the common femoral vein.  

## 2012-11-19 ENCOUNTER — Encounter: Payer: Self-pay | Admitting: Cardiology

## 2012-11-19 ENCOUNTER — Other Ambulatory Visit: Payer: Self-pay | Admitting: Internal Medicine

## 2012-11-19 ENCOUNTER — Ambulatory Visit
Admission: RE | Admit: 2012-11-19 | Discharge: 2012-11-19 | Disposition: A | Discharge status: Home / Self Care | Payer: Medicare Other | Source: Ambulatory Visit | Attending: Internal Medicine | Admitting: Internal Medicine

## 2012-11-19 ENCOUNTER — Ambulatory Visit (INDEPENDENT_AMBULATORY_CARE_PROVIDER_SITE_OTHER): Payer: Medicare Other | Admitting: Cardiology

## 2012-11-19 VITALS — BP 140/79 | HR 113 | Ht 64.0 in | Wt 169.0 lb

## 2012-11-19 DIAGNOSIS — R000 Tachycardia, unspecified: Secondary | ICD-10-CM

## 2012-11-19 DIAGNOSIS — J984 Other disorders of lung: Secondary | ICD-10-CM | POA: Diagnosis not present

## 2012-11-19 DIAGNOSIS — R0602 Shortness of breath: Secondary | ICD-10-CM

## 2012-11-19 DIAGNOSIS — Z96659 Presence of unspecified artificial knee joint: Secondary | ICD-10-CM | POA: Diagnosis not present

## 2012-11-19 LAB — CBC WITH DIFFERENTIAL/PLATELET
Basophils Absolute: 0 10*3/uL (ref 0.0–0.1)
Basophils Relative: 0.2 % (ref 0.0–3.0)
Eosinophils Absolute: 0.1 10*3/uL (ref 0.0–0.7)
Eosinophils Relative: 1 % (ref 0.0–5.0)
HCT: 35.7 % — ABNORMAL LOW (ref 36.0–46.0)
Hemoglobin: 11.8 g/dL — ABNORMAL LOW (ref 12.0–15.0)
Lymphocytes Relative: 26.7 % (ref 12.0–46.0)
Lymphs Abs: 1.9 10*3/uL (ref 0.7–4.0)
MCHC: 33.2 g/dL (ref 30.0–36.0)
MCV: 94.2 fl (ref 78.0–100.0)
Monocytes Absolute: 0.7 10*3/uL (ref 0.1–1.0)
Monocytes Relative: 9.9 % (ref 3.0–12.0)
Neutro Abs: 4.4 10*3/uL (ref 1.4–7.7)
Neutrophils Relative %: 62.2 % (ref 43.0–77.0)
Platelets: 338 10*3/uL (ref 150.0–400.0)
RBC: 3.79 Mil/uL — ABNORMAL LOW (ref 3.87–5.11)
RDW: 13.3 % (ref 11.5–14.6)
WBC: 7.1 10*3/uL (ref 4.5–10.5)

## 2012-11-19 LAB — BASIC METABOLIC PANEL WITH GFR
BUN: 9 mg/dL (ref 6–23)
CO2: 31 meq/L (ref 19–32)
Calcium: 9.5 mg/dL (ref 8.4–10.5)
Chloride: 95 meq/L — ABNORMAL LOW (ref 96–112)
Creatinine, Ser: 0.6 mg/dL (ref 0.4–1.2)
GFR: 102.4 mL/min
Glucose, Bld: 135 mg/dL — ABNORMAL HIGH (ref 70–99)
Potassium: 3.9 meq/L (ref 3.5–5.1)
Sodium: 135 meq/L (ref 135–145)

## 2012-11-19 LAB — TSH: TSH: 1.88 u[IU]/mL (ref 0.35–5.50)

## 2012-11-19 MED ORDER — IOHEXOL 350 MG/ML SOLN
100.0000 mL | Freq: Once | INTRAVENOUS | Status: AC | PRN
  Administered 2012-11-19: 100 mL via INTRAVENOUS

## 2012-11-19 NOTE — Patient Instructions (Signed)
Lab Work today (cbc,bmet,tsh )   Your physician has requested that you have an echocardiogram. Echocardiography is a painless test that uses sound waves to create images of your heart. It provides your doctor with information about the size and shape of your heart and how well your heart's chambers and valves are working. This procedure takes approximately one hour. There are no restrictions for this procedure.    Your physician recommends that you schedule a follow-up appointment in: 2 weeks

## 2012-11-19 NOTE — Progress Notes (Signed)
Patient ID: MIEKO KNEEBONE, female   DOB: 1940-12-05, 72 y.o.   MRN: [id number]    Patient Name: Renee [name] Date of Encounter: 11/19/2012  Primary Care Provider:  Londell Moh, MD Primary Cardiologist:  Tobias Alexander, H    Patient Profile  Tachycardia  Problem List   Past Medical History  Diagnosis Date  . Blood transfusion 1948  . Cataract     both eyes  . Hyperlipidemia   . Hypertension   . Fibromyalgia   . Arthritis     oa  . DVT (deep venous thrombosis) 2002    left knee  . GERD (gastroesophageal reflux disease)   . Diverticulosis   . Measles as child  . Mumps age 77  . COPD (chronic obstructive pulmonary disease)   . Breast cancer 1994    bilateral  . Disc disease, degenerative, cervical     trouble turing head and neck at times  . PONV (postoperative nausea and vomiting)    Past Surgical History  Procedure Laterality Date  . Colonoscopy    . Polypectomy    . Partial hysterectomy    . Mastectomy      bilateral  . Knee surgery  2002    arthroscopy-bilateral  . Abdominal hysterectomy  1976    partial  . Back surgery  1976    lower  . Bilateral mastectomy with tramflap reconstruction  1994  . Knee arthroscopy Right 2008  . Achilles tendon adn 2 bone spurs removed Left 05-2012  . 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 Aspirin Nausea Only  . Codeine Itching    Mood changes, can take percocet  . Ibuprofen Hives  . Lisinopril Cough    HPI  72 year old female with past medical history of hypertension hyperlipidemia, who recently underwent total knee replacement and was found to be tachycardiac on multiple occasions.  The patient was seen by her primary care physician yesterday and because of the elevated d-dimer, a venous left lower extremity duplex  was ordered and was negative for acute or chronic DVT. The patient is scheduled for chest CT today to rule out pulmonary embolism. The patient states that she feels significantly short of breath, simple tasks of daily living such as taking shower make her short of breath and sweats profusely. She denies any chest pain, palpitations, or syncope.   Home Medications  Prior to Admission medications   Medication Sig Start Date End Date Taking? Authorizing Provider  DULoxetine (CYMBALTA) 60 MG capsule Take 60 mg by mouth every evening.   Yes Historical Provider, MD  ezetimibe (ZETIA) 10 MG tablet Take 10 mg by mouth every evening.    Yes Historical Provider, MD  HYDROmorphone (DILAUDID) 2 MG tablet Take 1-2 tablets (2-4 mg total) by mouth every 3 (three) hours as needed. 10/16/12  Yes Alexzandrew Julien Girt, PA-C  losartan-hydrochlorothiazide (HYZAAR) 100-25 MG per tablet Take 0.5 tablets by mouth every morning.    Yes Historical Provider, MD  methocarbamol (ROBAXIN) 500 MG tablet Take 1 tablet (500 mg total) by mouth every 6 (six) hours as needed. 10/16/12  Yes Alexzandrew Julien Girt, PA-C  rivaroxaban (XARELTO) 10 MG TABS tablet Take 1 tablet (10 mg total) by mouth daily with breakfast. Take Xarelto for two and a half more weeks, then discontinue Xarelto. Once the patient  has completed the Xarelto, they may resume the 81 mg Aspirin. 10/16/12  Yes Alexzandrew Julien Girt, PA-C  rosuvastatin (CRESTOR) 10 MG tablet Take 10 mg by mouth every evening.    Yes Historical Provider, MD    Family History  Family History  Problem Relation Age of Onset  . Heart disease Maternal Grandfather     CAD  . Alzheimer's disease Mother     Social History  History   Social History  . Marital Status: Married    Spouse Name: N/A    Number of Children: 2  . Years of Education: N/A   Occupational History  . RETIRED    Social History Main Topics  . Smoking status: Former Smoker -- 1.00 packs/day for 20 years    Types:  Cigarettes    Quit date: 02/11/1992  . Smokeless tobacco: Never Used  . Alcohol Use: 3.0 oz/week    5 Glasses of wine per week     Comment: occasional wine  . Drug Use: No  . Sexual Activity: Not on file   Other Topics Concern  . Not on file   Social History Narrative  . No narrative on file     Review of Systems as per HPI General:  No chills, fever, night sweats or weight changes.  Cardiovascular:  No chest pain, dyspnea on exertion, edema, orthopnea, palpitations, paroxysmal nocturnal dyspnea. Dermatological: No rash, lesions/masses Respiratory: No cough, dyspnea Urologic: No hematuria, dysuria Abdominal:   No nausea, vomiting, diarrhea, bright red blood per rectum, melena, or hematemesis Neurologic:  No visual changes, wkns, changes in mental status. All other systems reviewed and are otherwise negative except as noted above.  Physical Exam  Blood pressure 140/79, pulse 113, height 5\' 4"  (1.626 m), weight 169 lb (76.658 kg).  General: Pleasant, NAD Psych: Normal affect. Neuro: Alert and oriented X 3. Moves all extremities spontaneously. HEENT: Normal  Neck: Supple without bruits or JVD. Lungs:  Resp regular and unlabored, CTA. Heart: RRR no s3, s4, or murmurs. Abdomen: Soft, non-tender, non-distended, BS + x 4.  Extremities: No clubbing, cyanosis or edema. DP/PT/Radials 2+ and equal bilaterally.  Accessory Clinical Findings  ECG - sinus tachycardia, 114 beats per minute, incomplete right bundle branch block  LE venous Duplex  - No evidence of deep vein or superficial thrombosis involving the left lower extremity and right common femoral vein. An enlarged inguinal lymph node is noted on the left. - No evidence of Baker's cyst on the left. Other specific details can be found in the table(s) above. Prepared and Electronically Authenticated by  Sherren Kerns 2014-10-20T19:[id number]   Assessment & Plan  72 year old female post left knee replacement with  sinus tachycardia. The differential is broad and includes pulmonary embolism, anemia, thyroid disease. We will await for the results of CT scan for pulmonary embolism. We will order basic metabolic profile, CBC and TSH. We will also order a cardiac echocardiogram to evaluate L. the function in a setting of prolonged sinus tachycardia rule out tachycardia-induced cardiomyopathy. Is evaluate right heart for strain as a sign of pulmonary embolism.  If all of the above workup is negative, he would consider ischemia workup as the patient states that she felt significantly short of breath with small activities already prior to her knee surgery.  Followup in 2 weeks.    Tobias Alexander, Rexene Edison, MD 11/19/2012, 1:13 PM

## 2012-11-20 ENCOUNTER — Ambulatory Visit (HOSPITAL_COMMUNITY)
Admission: RE | Admit: 2012-11-20 | Discharge: 2012-11-20 | Disposition: A | Discharge status: Home / Self Care | Payer: Medicare Other | Source: Ambulatory Visit | Attending: Orthopedic Surgery | Admitting: Orthopedic Surgery

## 2012-11-20 NOTE — Progress Notes (Signed)
Physical Therapy Treatment Patient Details  Name: Renee [name] MRN: [id number] Date of Birth: August 18, 1940  Today's Date: 11/20/2012 Time: 1110-1200 PT Time Calculation (min): 50 min Charges: Therex x 28'(1110-1138) Manual x 10'(1139-1149) Ice x 10'(1150-1200)  Visit#: 6 of 12  Re-eval: 12/04/12  Authorization: Medicare  Authorization Time Period:    Authorization Visit#: 6 of 10   Subjective: Symptoms/Limitations Symptoms: Pt states that MD said her knee looks good. He suggested that she take muscle relaxers before coming to therapy. Pain Assessment Currently in Pain?: Yes Pain Score: 3  Pain Location: Knee Pain Orientation: Left   Exercise/Treatments Stretches Quad Stretch: 2 reps;30 seconds;Limitations Lobbyist Limitations: chair lunge Aerobic Stationary Bike: rocking 10 minutes seat 8 for AROM Isokinetic: Biodex PROM 100% flexion Standing Knee Flexion: 10 reps;Limitations Knee Flexion Limitations: 5" holds Supine Knee Extension: PROM Knee Flexion: PROM   Manual Therapy Manual Therapy: Myofascial release Joint Mobilization: Grade II-III a/p mobs to improve motion/decrease pain Myofascial Release: Gentle MFR throughout left knee to decrease pain and adhesions Cryotherapy Number Minutes Cryotherapy: 10 Minutes Cryotherapy Location: Knee (Left) Type of Cryotherapy: Ice pack  Physical Therapy Assessment and Plan PT Assessment and Plan Clinical Impression Statement: Pt displays improved tolerance for stretching and PROM secondary to muscle relaxer. Pt able to tolerate 100% flexion on biodex. Ice applied at end of session to limit pain and inflammation. PT Plan: Focus on improving knee A/PROM to improve knee flexion and extension with TOLERABLE PAIN LEVEL.  Continue to monitor DVT.     Problem List Patient Active Problem List   Diagnosis Date Noted  . Difficulty in walking(719.7) 11/04/2012  . Stiffness of left knee 11/04/2012  . Left knee pain  11/04/2012  . Postoperative anemia due to acute blood loss 10/15/2012  . GERD (gastroesophageal reflux disease) 10/15/2012  . Unspecified essential hypertension 10/15/2012  . Hyperlipidemia 10/15/2012  . OA (osteoarthritis) of knee 10/14/2012  . Cough 02/14/2012  . Dyspnea 02/09/2012  . Chest pain 02/09/2012    PT - End of Session Activity Tolerance: Patient tolerated treatment well General Behavior During Therapy: [facility] for tasks assessed/performed  Seth Bake, PTA  11/20/2012, 5:00 PM

## 2012-11-22 ENCOUNTER — Ambulatory Visit (HOSPITAL_COMMUNITY)
Admission: RE | Admit: 2012-11-22 | Discharge: 2012-11-22 | Disposition: A | Discharge status: Home / Self Care | Payer: Medicare Other | Source: Ambulatory Visit | Attending: Orthopedic Surgery | Admitting: Orthopedic Surgery

## 2012-11-22 DIAGNOSIS — M25562 Pain in left knee: Secondary | ICD-10-CM

## 2012-11-22 DIAGNOSIS — R262 Difficulty in walking, not elsewhere classified: Secondary | ICD-10-CM

## 2012-11-22 DIAGNOSIS — M25662 Stiffness of left knee, not elsewhere classified: Secondary | ICD-10-CM

## 2012-11-22 NOTE — Progress Notes (Signed)
Physical Therapy Treatment Patient Details  Name: Renee [name] MRN: [id number] Date of Birth: May 08, 1940  Today's Date: 11/22/2012 Time: 7829-5621 PT Time Calculation (min): 53 min Charges: TE: 25' Manual: 28'  Visit#: 7 of 12  Re-eval: 12/04/12   Authorization: Medicare  Authorization Time Period:    Authorization Visit#: 6 of 10   Subjective: Symptoms/Limitations Symptoms: Pt states that she is has never been in so much pain. Shes been cleared of pulmonary embolious.  Pain Assessment Currently in Pain?: Yes Pain Score: 7  Pain Location: Knee Pain Orientation: Left Pain Type: Acute pain  Precautions/Restrictions     Exercise/Treatments Aerobic Stationary Bike: rocking 10 minutes seat 8 for AROM Isokinetic: Biodex PROM 100% flexion 40%  Supine Knee Extension: PROM Knee Flexion: PROM   Manual Therapy Manual Therapy: Myofascial release Myofascial Release: aggressive MFR Lt knee scar tissue with patellar mobilizations to decrease fascial restrictions at distal end of scar over patella to tibial tubricle.  Cryotherapy Cryotherapy Location: Knee (Left)  Physical Therapy Assessment and Plan PT Assessment and Plan Clinical Impression Statement: Pt is able to move into Lt knee AROM 95 degrees after manual techniques and ended with Biodex.  At end of session pt was able to go around the bike 10x backwards and 6 times forward (w/hip hike) with less pain.  Declined ice at end of session.  PT Plan: Focus on improving knee A/PROM to improve knee flexion and extension with TOLERABLE PAIN LEVEL.     Goals    Problem List Patient Active Problem List   Diagnosis Date Noted  . Difficulty in walking(719.7) 11/04/2012  . Stiffness of left knee 11/04/2012  . Left knee pain 11/04/2012  . Postoperative anemia due to acute blood loss 10/15/2012  . GERD (gastroesophageal reflux disease) 10/15/2012  . Unspecified essential hypertension 10/15/2012  . Hyperlipidemia 10/15/2012   . OA (osteoarthritis) of knee 10/14/2012  . Cough 02/14/2012  . Dyspnea 02/09/2012  . Chest pain 02/09/2012    PT - End of Session Activity Tolerance: Patient tolerated treatment well General Behavior During Therapy: [facility] for tasks assessed/performed  GP    Harlyn Rathmann, MPT, ATC 11/22/2012, 12:58 PM

## 2012-11-25 ENCOUNTER — Ambulatory Visit (HOSPITAL_COMMUNITY)
Admission: RE | Admit: 2012-11-25 | Discharge: 2012-11-25 | Disposition: A | Discharge status: Home / Self Care | Payer: Medicare Other | Source: Ambulatory Visit | Attending: Orthopedic Surgery | Admitting: Orthopedic Surgery

## 2012-11-25 NOTE — Progress Notes (Signed)
Physical Therapy Treatment Patient Details  Name: Renee [name] MRN: [id number] Date of Birth: 03/16/1940  Today's Date: 11/25/2012 Time: 1020-1100 PT Time Calculation (min): 40 min Charges: Therex 28'(1020-1048) Manual x 10'(1050-1100)  Visit#: 8 of 12  Re-eval: 12/04/12  Authorization: Medicare  Authorization Visit#: 8 of 10   Subjective: Symptoms/Limitations Symptoms: Pt states that her knee is feeling better today. Pain Assessment Currently in Pain?: Yes Pain Score: 4  Pain Location: Knee Pain Orientation: Left   Exercise/Treatments Stretches Quad Stretch: 2 reps;30 seconds;Limitations Lobbyist Limitations: chair lunge Gastroc Stretch: 2 reps;30 seconds;Limitations Gastroc Stretch Limitations: Slant board Aerobic Isokinetic: Biodex PROM 105% flexion   Manual Therapy Manual Therapy: Myofascial release Joint Mobilization: Grade II-III a/p mobs to improve motion/decrease pain Myofascial Release: aggressive MFR Lt knee scar tissue with patellar mobilizations to decrease fascial restrictions at distal end of scar over patella to tibial tubricle. Cryotherapy Cryotherapy Location: Knee  Physical Therapy Assessment and Plan PT Assessment and Plan Clinical Impression Statement: Pt able to tolerate max on biodex this session with gradual increase from 95%. Pt displays improved motion and improved gait mechanics this session. Tolerance for stretching appears to have improved. Pt declines ice and plans to ice her knee at home. PT Plan: Focus on improving knee A/PROM to improve knee flexion and extension with TOLERABLE PAIN LEVEL.      Problem List Patient Active Problem List   Diagnosis Date Noted  . Difficulty in walking(719.7) 11/04/2012  . Stiffness of left knee 11/04/2012  . Left knee pain 11/04/2012  . Postoperative anemia due to acute blood loss 10/15/2012  . GERD (gastroesophageal reflux disease) 10/15/2012  . Unspecified essential hypertension 10/15/2012   . Hyperlipidemia 10/15/2012  . OA (osteoarthritis) of knee 10/14/2012  . Cough 02/14/2012  . Dyspnea 02/09/2012  . Chest pain 02/09/2012    PT - End of Session Activity Tolerance: Patient tolerated treatment well General Behavior During Therapy: [facility] for tasks assessed/performed  Seth Bake, PTA 11/25/2012, 3:06 PM

## 2012-11-27 ENCOUNTER — Ambulatory Visit (HOSPITAL_COMMUNITY): Payer: Medicare Other | Admitting: *Deleted

## 2012-11-29 ENCOUNTER — Ambulatory Visit (HOSPITAL_COMMUNITY): Payer: Medicare Other | Admitting: Physical Therapy

## 2012-11-29 ENCOUNTER — Telehealth (HOSPITAL_COMMUNITY): Payer: Self-pay

## 2012-12-03 ENCOUNTER — Ambulatory Visit (HOSPITAL_COMMUNITY): Payer: Medicare Other | Attending: Cardiology | Admitting: Cardiology

## 2012-12-03 DIAGNOSIS — I079 Rheumatic tricuspid valve disease, unspecified: Secondary | ICD-10-CM | POA: Diagnosis not present

## 2012-12-03 DIAGNOSIS — E785 Hyperlipidemia, unspecified: Secondary | ICD-10-CM | POA: Diagnosis not present

## 2012-12-03 DIAGNOSIS — J4489 Other specified chronic obstructive pulmonary disease: Secondary | ICD-10-CM | POA: Insufficient documentation

## 2012-12-03 DIAGNOSIS — I379 Nonrheumatic pulmonary valve disorder, unspecified: Secondary | ICD-10-CM | POA: Insufficient documentation

## 2012-12-03 DIAGNOSIS — C50919 Malignant neoplasm of unspecified site of unspecified female breast: Secondary | ICD-10-CM | POA: Diagnosis not present

## 2012-12-03 DIAGNOSIS — I10 Essential (primary) hypertension: Secondary | ICD-10-CM | POA: Diagnosis not present

## 2012-12-03 DIAGNOSIS — R0600 Dyspnea, unspecified: Secondary | ICD-10-CM

## 2012-12-03 DIAGNOSIS — J449 Chronic obstructive pulmonary disease, unspecified: Secondary | ICD-10-CM | POA: Diagnosis not present

## 2012-12-03 DIAGNOSIS — R000 Tachycardia, unspecified: Secondary | ICD-10-CM | POA: Insufficient documentation

## 2012-12-03 DIAGNOSIS — R0602 Shortness of breath: Secondary | ICD-10-CM | POA: Diagnosis not present

## 2012-12-03 DIAGNOSIS — R0989 Other specified symptoms and signs involving the circulatory and respiratory systems: Secondary | ICD-10-CM | POA: Insufficient documentation

## 2012-12-03 DIAGNOSIS — R0609 Other forms of dyspnea: Secondary | ICD-10-CM | POA: Insufficient documentation

## 2012-12-03 DIAGNOSIS — Z87891 Personal history of nicotine dependence: Secondary | ICD-10-CM | POA: Diagnosis not present

## 2012-12-03 NOTE — Progress Notes (Signed)
Echo performed. 

## 2012-12-05 ENCOUNTER — Other Ambulatory Visit: Payer: Self-pay

## 2012-12-05 DIAGNOSIS — D649 Anemia, unspecified: Secondary | ICD-10-CM | POA: Diagnosis not present

## 2012-12-05 DIAGNOSIS — R918 Other nonspecific abnormal finding of lung field: Secondary | ICD-10-CM | POA: Diagnosis not present

## 2012-12-09 ENCOUNTER — Ambulatory Visit (INDEPENDENT_AMBULATORY_CARE_PROVIDER_SITE_OTHER): Payer: Medicare Other | Admitting: Cardiology

## 2012-12-09 ENCOUNTER — Encounter: Payer: Self-pay | Admitting: Cardiology

## 2012-12-09 VITALS — BP 138/70 | HR 119 | Ht 64.0 in | Wt 167.0 lb

## 2012-12-09 DIAGNOSIS — R0989 Other specified symptoms and signs involving the circulatory and respiratory systems: Secondary | ICD-10-CM

## 2012-12-09 DIAGNOSIS — R0609 Other forms of dyspnea: Secondary | ICD-10-CM

## 2012-12-09 DIAGNOSIS — R0600 Dyspnea, unspecified: Secondary | ICD-10-CM

## 2012-12-09 MED ORDER — METOPROLOL TARTRATE 25 MG PO TABS: 25.0000 mg | ORAL_TABLET | Freq: Two times a day (BID) | ORAL | Status: DC

## 2012-12-09 NOTE — Progress Notes (Signed)
Patient ID: AARIEL EMS, female   DOB: September 26, 1940, 72 y.o.   MRN: [id number]  Patient Name: Renee [name] Date of Encounter: 12/09/2012  Primary Care Provider:  Londell Moh, MD Primary Cardiologist:  Tobias Alexander, H   Patient Profile  Tachycardia  Problem List   Past Medical History  Diagnosis Date  . Blood transfusion 1948  . Cataract     both eyes  . Hyperlipidemia   . Hypertension   . Fibromyalgia   . Arthritis     oa  . DVT (deep venous thrombosis) 2002    left knee  . GERD (gastroesophageal reflux disease)   . Diverticulosis   . Measles as child  . Mumps age 46  . COPD (chronic obstructive pulmonary disease)   . Breast cancer 1994    bilateral  . Disc disease, degenerative, cervical     trouble turing head and neck at times  . PONV (postoperative nausea and vomiting)    Past Surgical History  Procedure Laterality Date  . Colonoscopy    . Polypectomy    . Partial hysterectomy    . Mastectomy      bilateral  . Knee surgery  2002    arthroscopy-bilateral  . Abdominal hysterectomy  1976    partial  . Back surgery  1976    lower  . Bilateral mastectomy with tramflap reconstruction  1994  . Knee arthroscopy Right 2008  . Achilles tendon adn 2 bone spurs removed Left 05-2012  . 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 Aspirin Nausea Only  . Codeine Itching    Mood changes, can take percocet  . Ibuprofen Hives  . Lisinopril Cough   HPI  72 year old female with past medical history of hypertension hyperlipidemia, who recently underwent total knee replacement and was found to be tachycardiac on multiple occasions.  The patient was seen by her primary care physician yesterday and because of the elevated d-dimer, a venous left lower extremity duplex was  ordered and was negative for acute or chronic DVT. The patient is scheduled for chest CT today to rule out pulmonary embolism. The patient states that she feels significantly short of breath, simple tasks of daily living such as taking shower make her short of breath and sweats profusely. She denies any chest pain, palpitations, or syncope.   Home Medications  Prior to Admission medications   Medication Sig Start Date End Date Taking? Authorizing Provider  DULoxetine (CYMBALTA) 60 MG capsule Take 60 mg by mouth every evening.   Yes Historical Provider, MD  ezetimibe (ZETIA) 10 MG tablet Take 10 mg by mouth every evening.    Yes Historical Provider, MD  HYDROmorphone (DILAUDID) 2 MG tablet Take 1-2 tablets (2-4 mg total) by mouth every 3 (three) hours as needed. 10/16/12  Yes Alexzandrew Julien Girt, PA-C  losartan-hydrochlorothiazide (HYZAAR) 100-25 MG per tablet Take 0.5 tablets by mouth every morning.    Yes Historical Provider, MD  methocarbamol (ROBAXIN) 500 MG tablet Take 1 tablet (500 mg total) by mouth every 6 (six) hours as needed. 10/16/12  Yes Alexzandrew Julien Girt, PA-C  rivaroxaban (XARELTO) 10 MG TABS tablet Take 1 tablet (10 mg total) by mouth daily with breakfast. Take Xarelto for two and a half more weeks, then discontinue Xarelto. Once the patient has completed the Xarelto,  they may resume the 81 mg Aspirin. 10/16/12  Yes Alexzandrew Julien Girt, PA-C  rosuvastatin (CRESTOR) 10 MG tablet Take 10 mg by mouth every evening.    Yes Historical Provider, MD    Family History  Family History  Problem Relation Age of Onset  . Heart disease Maternal Grandfather     CAD  . Alzheimer's disease Mother     Social History  History   Social History  . Marital Status: Married    Spouse Name: N/A    Number of Children: 2  . Years of Education: N/A   Occupational History  . RETIRED    Social History Main Topics  . Smoking status: Former Smoker -- 1.00 packs/day for 20 years    Types:  Cigarettes    Quit date: 02/11/1992  . Smokeless tobacco: Never Used  . Alcohol Use: 3.0 oz/week    5 Glasses of wine per week     Comment: occasional wine  . Drug Use: No  . Sexual Activity: Not on file   Other Topics Concern  . Not on file   Social History Narrative  . No narrative on file     Review of Systems as per HPI General:  No chills, fever, night sweats or weight changes.  Cardiovascular:  No chest pain, dyspnea on exertion, edema, orthopnea, palpitations, paroxysmal nocturnal dyspnea. Dermatological: No rash, lesions/masses Respiratory: No cough, dyspnea Urologic: No hematuria, dysuria Abdominal:   No nausea, vomiting, diarrhea, bright red blood per rectum, melena, or hematemesis Neurologic:  No visual changes, wkns, changes in mental status. All other systems reviewed and are otherwise negative except as noted above.  Physical Exam  BP 138/70, HR 119  General: Pleasant, NAD Psych: Normal affect. Neuro: Alert and oriented X 3. Moves all extremities spontaneously. HEENT: Normal  Neck: Supple without bruits or JVD. Lungs:  Resp regular and unlabored, CTA. Heart: RRR no s3, s4, or murmurs. Abdomen: Soft, non-tender, non-distended, BS + x 4.  Extremities: No clubbing, cyanosis or edema. DP/PT/Radials 2+ and equal bilaterally.  Accessory Clinical Findings  ECG - sinus tachycardia, 114 beats per minute, incomplete right bundle branch block  LE venous Duplex  - No evidence of deep vein or superficial thrombosis involving the left lower extremity and right common femoral vein. An enlarged inguinal lymph node is noted on the left. - No evidence of Baker's cyst on the left. Other specific details can be found in the table(s) above. Prepared and Electronically Authenticated by  Sherren Kerns 2014-10-20T19:43:54.023  CT PE IMPRESSION: Negative for pulmonary embolism. No acute chest abnormalities.  Several punctate pulmonary nodules in both lungs.  Nodules measure between 2-3 mm and are nonspecific. If the patient is at high risk for bronchogenic carcinoma, follow-up chest CT at 1year is recommended. If the patient is at low risk, no follow-up is needed. This recommendation follows the consensus statement: Guidelines for Management of Small Pulmonary Nodules Detected on CT Scans: A Statement from the Fleischner Society as published in Radiology 2005; 237:395-400.  Electronically Signed By: Richarda Overlie M.D. On: 11/19/2012 15:52  TTE 12/03/2012 Study Conclusions  - Left ventricle: The cavity size was normal. Wall thickness was increased in a pattern of moderate LVH. Systolic function was normal. The estimated ejection fraction was in the range of 60% to 65%. - Atrial septum: No defect or patent foramen ovale was identified. - Pericardium, extracardiac: A trivial pericardial effusion was identified.    Assessment & Plan  72 year old female post left knee replacement  with sinus tachycardia. Chest CT ruled out pulmonary embolism.  The differential is broad and includes pulmonary embolism, anemia, thyroid disease. We will await for the results of CT scan for pulmonary embolism. Basic metabolic profile, CBC and TSH are normal. Echocardiogram shows normal LV function, no signs of tachycardia-induced cardiomyopathy. There is also normal RV function and normal right sided pressures with no signs of right strain. It is possible that she has inadequate sinus tachycardia. We will start her on Metoprolol 25 mg po bid.  The patient reports dyspnea on exertion, we will order an exercise nuclear stress test to evaluate for ischemia, also HR response to exercise.  Follow up in 1 month.   Tobias Alexander, Rexene Edison, MD 12/09/2012, 10:32 AM

## 2012-12-09 NOTE — Patient Instructions (Addendum)
Your physician recommends that you schedule a follow-up appointment in:  1 month.   Your physician has requested that you have an exercise stress myoview. For further information please visit [email or web address] Please follow instruction sheet, as given.  Your physician has recommended you make the following change in your medication: Start lopressor 25 mg by mouth twice daily

## 2012-12-24 ENCOUNTER — Ambulatory Visit (HOSPITAL_COMMUNITY): Payer: Medicare Other | Attending: Cardiology | Admitting: Radiology

## 2012-12-24 VITALS — BP 145/81 | HR 106 | Ht 65.0 in | Wt 167.0 lb

## 2012-12-24 DIAGNOSIS — I4510 Unspecified right bundle-branch block: Secondary | ICD-10-CM | POA: Diagnosis not present

## 2012-12-24 DIAGNOSIS — R0609 Other forms of dyspnea: Secondary | ICD-10-CM | POA: Insufficient documentation

## 2012-12-24 DIAGNOSIS — Z87891 Personal history of nicotine dependence: Secondary | ICD-10-CM | POA: Insufficient documentation

## 2012-12-24 DIAGNOSIS — R0989 Other specified symptoms and signs involving the circulatory and respiratory systems: Secondary | ICD-10-CM | POA: Diagnosis not present

## 2012-12-24 DIAGNOSIS — E785 Hyperlipidemia, unspecified: Secondary | ICD-10-CM | POA: Diagnosis not present

## 2012-12-24 DIAGNOSIS — R0602 Shortness of breath: Secondary | ICD-10-CM | POA: Diagnosis not present

## 2012-12-24 DIAGNOSIS — I10 Essential (primary) hypertension: Secondary | ICD-10-CM | POA: Diagnosis not present

## 2012-12-24 DIAGNOSIS — R002 Palpitations: Secondary | ICD-10-CM | POA: Insufficient documentation

## 2012-12-24 DIAGNOSIS — R000 Tachycardia, unspecified: Secondary | ICD-10-CM | POA: Diagnosis not present

## 2012-12-24 DIAGNOSIS — R0600 Dyspnea, unspecified: Secondary | ICD-10-CM

## 2012-12-24 DIAGNOSIS — Z8249 Family history of ischemic heart disease and other diseases of the circulatory system: Secondary | ICD-10-CM | POA: Diagnosis not present

## 2012-12-24 MED ORDER — TECHNETIUM TC 99M SESTAMIBI GENERIC - CARDIOLITE
11.0000 | Freq: Once | INTRAVENOUS | Status: AC | PRN
  Administered 2012-12-24: 11 via INTRAVENOUS

## 2012-12-24 MED ORDER — TECHNETIUM TC 99M SESTAMIBI GENERIC - CARDIOLITE
33.0000 | Freq: Once | INTRAVENOUS | Status: AC | PRN
  Administered 2012-12-24: 33 via INTRAVENOUS

## 2012-12-24 NOTE — Progress Notes (Signed)
  [facility] SITE 3 NUCLEAR MED [address] [place], [place] [address] [phone]    Cardiology Nuclear Med Study  Renee [name] is a 72 y.o. female     MRN : [id number]     DOB: [birth year]  Procedure Date: 12/24/2012  Nuclear Med Background Indication for Stress Test:  Evaluation for Ischemia History:  No prior known history of CAD, '10 Myocardial Perfusion Imaging-Normal, EF=82%, and 12-03-12 Echo: EF=60-65% Cardiac Risk Factors: Family History - CAD, History of Smoking, Hypertension, Lipids and RBBB  Symptoms:  DOE, Palpitations and Rapid HR   Nuclear Pre-Procedure Caffeine/Decaff Intake:  8:00pm NPO After: 11:00pm   Lungs:  clear O2 Sat: 97% on room air. IV 0.9% NS with Angio Cath:  22g  IV Site: L Hand  IV Started by:  Cathlyn Parsons, RN  Chest Size (in):  36 Cup Size: B  Height: 5\' 5"  (1.651 m)  Weight:  167 lb (75.751 kg)  BMI:  Body mass index is 27.79 kg/(m^2). Tech Comments:  No Lopressor x 24 hrs    Nuclear Med Study 1 or 2 day study: 1 day  Stress Test Type:  Stress  Reading MD: Marca Ancona, MD  Order Authorizing Provider:  Wyvonna Plum  Resting Radionuclide: Technetium 49m Sestamibi  Resting Radionuclide Dose: 10.8 mCi   Stress Radionuclide:  Technetium 27m Sestamibi  Stress Radionuclide Dose: 33.0 mCi           Stress Protocol Rest HR: 106 Stress HR: 157  Rest BP: 145/81 Stress BP: 220/106  Exercise Time (min): 3:15 METS: 4.9           Dose of Adenosine (mg):  n/a Dose of Lexiscan: n/a mg  Dose of Atropine (mg): n/a Dose of Dobutamine: n/a mcg/kg/min (at max HR)  Stress Test Technologist: Irean Hong, RN  Nuclear Technologist:  Domenic Polite, CNMT     Rest Procedure:  Myocardial perfusion imaging was performed at rest 45 minutes following the intravenous administration of Technetium 83m Sestamibi. Rest ECG: NSR - Normal EKG  Stress Procedure:  The patient exercised on the treadmill utilizing the Bruce Protocol  for 3:15 minutes, RPE= 17. The patient stopped due to DOE and denied any chest pain. There was a marked hypertensive response to exercise. Technetium 21m Sestamibi was injected at peak exercise and myocardial perfusion imaging was performed after a brief delay. Stress ECG: No significant change from baseline ECG  QPS Raw Data Images:  Normal; no motion artifact; normal heart/lung ratio. Stress Images:  Normal homogeneous uptake in all areas of the myocardium. Rest Images:  Normal homogeneous uptake in all areas of the myocardium. Subtraction (SDS):  There is no evidence of scar or ischemia. Transient Ischemic Dilatation (Normal <1.22):  1.06 Lung/Heart Ratio (Normal <0.45):  0.28  Quantitative Gated Spect Images QGS EDV:  60 ml QGS ESV:  17 ml  Impression Exercise Capacity:  Poor exercise capacity. BP Response:  Hypertensive blood pressure response. Clinical Symptoms:  Dyspnea ECG Impression:  PACs, ventricular couplet.  No ischemic ST segment changes.  Comparison with Prior Nuclear Study: No significant change from previous study  Overall Impression:  Normal stress nuclear study.  LV Ejection Fraction: 72%.  LV Wall Motion:  NL LV Function; NL Wall Motion  Marca Ancona 12/24/2012

## 2013-01-01 DIAGNOSIS — R262 Difficulty in walking, not elsewhere classified: Secondary | ICD-10-CM | POA: Diagnosis not present

## 2013-01-01 DIAGNOSIS — M25569 Pain in unspecified knee: Secondary | ICD-10-CM | POA: Diagnosis not present

## 2013-01-02 DIAGNOSIS — H52229 Regular astigmatism, unspecified eye: Secondary | ICD-10-CM | POA: Diagnosis not present

## 2013-01-02 DIAGNOSIS — H2589 Other age-related cataract: Secondary | ICD-10-CM | POA: Diagnosis not present

## 2013-01-02 DIAGNOSIS — H43819 Vitreous degeneration, unspecified eye: Secondary | ICD-10-CM | POA: Diagnosis not present

## 2013-01-02 DIAGNOSIS — H5200 Hypermetropia, unspecified eye: Secondary | ICD-10-CM | POA: Diagnosis not present

## 2013-01-08 ENCOUNTER — Ambulatory Visit (INDEPENDENT_AMBULATORY_CARE_PROVIDER_SITE_OTHER): Payer: Medicare Other | Admitting: Cardiology

## 2013-01-08 ENCOUNTER — Encounter: Payer: Self-pay | Admitting: Cardiology

## 2013-01-08 VITALS — BP 130/68 | HR 80 | Ht 64.0 in | Wt 171.0 lb

## 2013-01-08 DIAGNOSIS — I498 Other specified cardiac arrhythmias: Secondary | ICD-10-CM | POA: Diagnosis not present

## 2013-01-08 DIAGNOSIS — R0609 Other forms of dyspnea: Secondary | ICD-10-CM | POA: Diagnosis not present

## 2013-01-08 DIAGNOSIS — R0989 Other specified symptoms and signs involving the circulatory and respiratory systems: Secondary | ICD-10-CM

## 2013-01-08 DIAGNOSIS — R0600 Dyspnea, unspecified: Secondary | ICD-10-CM

## 2013-01-08 DIAGNOSIS — R000 Tachycardia, unspecified: Secondary | ICD-10-CM

## 2013-01-08 MED ORDER — METOPROLOL TARTRATE 50 MG PO TABS: 50.0000 mg | ORAL_TABLET | Freq: Two times a day (BID) | ORAL | Status: DC

## 2013-01-08 NOTE — Patient Instructions (Signed)
**Note De-Identified [place] Obfuscation** Your physician has recommended you make the following change in your medication: increase Metoprolol to 50 mg twice daily  Your physician wants you to follow-up in: 6 months. You will receive a reminder letter in the mail two months in advance. If you don't receive a letter, please call our office to schedule the follow-up appointment.

## 2013-01-08 NOTE — Progress Notes (Signed)
Patient ID: ROXANE PUERTO, female   DOB: February 27, 1940, 72 y.o.   MRN: [id number]  Patient Name: Renee [name] Date of Encounter: 01/08/2013  Primary Care Provider:  Londell Moh, MD Primary Cardiologist:  Tobias Alexander, H   Patient Profile  Tachycardia  Problem List   Past Medical History  Diagnosis Date  . Blood transfusion 1948  . Cataract     both eyes  . Hyperlipidemia   . Hypertension   . Fibromyalgia   . Arthritis     oa  . DVT (deep venous thrombosis) 2002    left knee  . GERD (gastroesophageal reflux disease)   . Diverticulosis   . Measles as child  . Mumps age 36  . COPD (chronic obstructive pulmonary disease)   . Breast cancer 1994    bilateral  . Disc disease, degenerative, cervical     trouble turing head and neck at times  . PONV (postoperative nausea and vomiting)    Past Surgical History  Procedure Laterality Date  . Colonoscopy    . Polypectomy    . Partial hysterectomy    . Mastectomy      bilateral  . Knee surgery  2002    arthroscopy-bilateral  . Abdominal hysterectomy  1976    partial  . Back surgery  1976    lower  . Bilateral mastectomy with tramflap reconstruction  1994  . Knee arthroscopy Right 2008  . Achilles tendon adn 2 bone spurs removed Left 05-2012  . 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 Aspirin Nausea Only  . Codeine Itching    Mood changes, can take percocet  . Ibuprofen Hives  . Lisinopril Cough   HPI  72 year old female with past medical history of hypertension hyperlipidemia, who recently underwent total knee replacement and was found to be tachycardiac on multiple occasions.  The patient was seen by her primary care physician yesterday and because of the elevated d-dimer, a venous left lower extremity duplex was  ordered and was negative for acute or chronic DVT. The patient is scheduled for chest CT today to rule out pulmonary embolism. The patient states that she feels significantly short of breath, simple tasks of daily living such as taking shower make her short of breath and sweats profusely. She denies any chest pain, palpitations, or syncope.  She is coming after 1 month, she feels well.  Home Medications  Prior to Admission medications   Medication Sig Start Date End Date Taking? Authorizing Provider  DULoxetine (CYMBALTA) 60 MG capsule Take 60 mg by mouth every evening.   Yes Historical Provider, MD  ezetimibe (ZETIA) 10 MG tablet Take 10 mg by mouth every evening.    Yes Historical Provider, MD  HYDROmorphone (DILAUDID) 2 MG tablet Take 1-2 tablets (2-4 mg total) by mouth every 3 (three) hours as needed. 10/16/12  Yes Alexzandrew Julien Girt, PA-C  losartan-hydrochlorothiazide (HYZAAR) 100-25 MG per tablet Take 0.5 tablets by mouth every morning.    Yes Historical Provider, MD  methocarbamol (ROBAXIN) 500 MG tablet Take 1 tablet (500 mg total) by mouth every 6 (six) hours as needed. 10/16/12  Yes Alexzandrew Julien Girt, PA-C  rivaroxaban (XARELTO) 10 MG TABS tablet Take 1 tablet (10 mg total) by mouth daily with breakfast. Take Xarelto for two and a half more weeks, then  discontinue Xarelto. Once the patient has completed the Xarelto, they may resume the 81 mg Aspirin. 10/16/12  Yes Alexzandrew Julien Girt, PA-C  rosuvastatin (CRESTOR) 10 MG tablet Take 10 mg by mouth every evening.    Yes Historical Provider, MD    Family History  Family History  Problem Relation Age of Onset  . Heart disease Maternal Grandfather     CAD  . Alzheimer's disease Mother     Social History  History   Social History  . Marital Status: Married    Spouse Name: N/A    Number of Children: 2  . Years of Education: N/A   Occupational History  . RETIRED    Social History Main Topics  . Smoking status: Former Smoker  -- 1.00 packs/day for 20 years    Types: Cigarettes    Quit date: 02/11/1992  . Smokeless tobacco: Never Used  . Alcohol Use: 3.0 oz/week    5 Glasses of wine per week     Comment: occasional wine  . Drug Use: No  . Sexual Activity: Not on file   Other Topics Concern  . Not on file   Social History Narrative  . No narrative on file     Review of Systems as per HPI General:  No chills, fever, night sweats or weight changes.  Cardiovascular:  No chest pain, dyspnea on exertion, edema, orthopnea, palpitations, paroxysmal nocturnal dyspnea. Dermatological: No rash, lesions/masses Respiratory: No cough, dyspnea Urologic: No hematuria, dysuria Abdominal:   No nausea, vomiting, diarrhea, bright red blood per rectum, melena, or hematemesis Neurologic:  No visual changes, wkns, changes in mental status. All other systems reviewed and are otherwise negative except as noted above.  Physical Exam  BP 138/70, HR 119  General: Pleasant, NAD Psych: Normal affect. Neuro: Alert and oriented X 3. Moves all extremities spontaneously. HEENT: Normal  Neck: Supple without bruits or JVD. Lungs:  Resp regular and unlabored, CTA. Heart: RRR no s3, s4, or murmurs. Abdomen: Soft, non-tender, non-distended, BS + x 4.  Extremities: No clubbing, cyanosis or edema. DP/PT/Radials 2+ and equal bilaterally.  Accessory Clinical Findings  ECG - sinus tachycardia, 114 beats per minute, incomplete right bundle branch block  LE venous Duplex  - No evidence of deep vein or superficial thrombosis involving the left lower extremity and right common femoral vein. An enlarged inguinal lymph node is noted on the left. - No evidence of Baker's cyst on the left. Other specific details can be found in the table(s) above. Prepared and Electronically Authenticated by  Sherren Kerns 2014-10-20T19:43:54.023  CT PE IMPRESSION: Negative for pulmonary embolism. No acute chest abnormalities.  Several  punctate pulmonary nodules in both lungs. Nodules measure between 2-3 mm and are nonspecific. If the patient is at high risk for bronchogenic carcinoma, follow-up chest CT at 1year is recommended. If the patient is at low risk, no follow-up is needed. This recommendation follows the consensus statement: Guidelines for Management of Small Pulmonary Nodules Detected on CT Scans: A Statement from the Fleischner Society as published in Radiology 2005; 237:395-400.  Electronically Signed By: Richarda Overlie M.D. On: 11/19/2012 15:52  TTE 12/03/2012 Study Conclusions  - Left ventricle: The cavity size was normal. Wall thickness was increased in a pattern of moderate LVH. Systolic function was normal. The estimated ejection fraction was in the range of 60% to 65%. - Atrial septum: No defect or patent foramen ovale was identified. - Pericardium, extracardiac: A trivial pericardial effusion was identified.   Exercise nuclear  stress test: 12/24/12  Quantitative Gated Spect Images  QGS EDV: 60 ml  QGS ESV: 17 ml  Impression  Exercise Capacity: Poor exercise capacity.  BP Response: Hypertensive blood pressure response.  Clinical Symptoms: Dyspnea  ECG Impression: PACs, ventricular couplet. No ischemic ST segment changes.  Comparison with Prior Nuclear Study: No significant change from previous study  Overall Impression: Normal stress nuclear study.  LV Ejection Fraction: 72%. LV Wall Motion: NL LV Function; NL Wall Motion  Marca Ancona  12/24/2012   Assessment & Plan  72 year old female post left knee replacement with    1. Sinus tachycardia. Chest CT ruled out pulmonary embolism. Tachycardia has rsolved with Metorprolol 25 mg po bid.  2. DOE - exercise nuclear stress test showed good LV EF and no ischemia, but poor exercise capacity, with hypertensive response and SOB after a short exercise time. Her echo shows moderate LVH concerning for longstanding HTN. We will increase  Metoprolol 25 to 50 PO BID.   Follow up in 6  month.   Tobias Alexander, Rexene Edison, MD 01/08/2013, 3:41 PM

## 2013-03-07 DIAGNOSIS — M1710 Unilateral primary osteoarthritis, unspecified knee: Secondary | ICD-10-CM | POA: Diagnosis not present

## 2013-03-07 DIAGNOSIS — Z96659 Presence of unspecified artificial knee joint: Secondary | ICD-10-CM | POA: Diagnosis not present

## 2013-03-21 DIAGNOSIS — E7800 Pure hypercholesterolemia, unspecified: Secondary | ICD-10-CM | POA: Diagnosis not present

## 2013-03-21 DIAGNOSIS — N390 Urinary tract infection, site not specified: Secondary | ICD-10-CM | POA: Diagnosis not present

## 2013-03-21 DIAGNOSIS — E119 Type 2 diabetes mellitus without complications: Secondary | ICD-10-CM | POA: Diagnosis not present

## 2013-03-25 DIAGNOSIS — I10 Essential (primary) hypertension: Secondary | ICD-10-CM | POA: Diagnosis not present

## 2013-03-25 DIAGNOSIS — E119 Type 2 diabetes mellitus without complications: Secondary | ICD-10-CM | POA: Diagnosis not present

## 2013-03-25 DIAGNOSIS — G4700 Insomnia, unspecified: Secondary | ICD-10-CM | POA: Diagnosis not present

## 2013-03-25 DIAGNOSIS — E7800 Pure hypercholesterolemia, unspecified: Secondary | ICD-10-CM | POA: Diagnosis not present

## 2013-03-25 DIAGNOSIS — M1710 Unilateral primary osteoarthritis, unspecified knee: Secondary | ICD-10-CM | POA: Diagnosis not present

## 2013-03-25 DIAGNOSIS — Z96659 Presence of unspecified artificial knee joint: Secondary | ICD-10-CM | POA: Diagnosis not present

## 2013-04-11 DIAGNOSIS — N390 Urinary tract infection, site not specified: Secondary | ICD-10-CM | POA: Diagnosis not present

## 2013-04-22 DIAGNOSIS — I10 Essential (primary) hypertension: Secondary | ICD-10-CM | POA: Diagnosis not present

## 2013-04-22 DIAGNOSIS — E119 Type 2 diabetes mellitus without complications: Secondary | ICD-10-CM | POA: Diagnosis not present

## 2013-04-22 DIAGNOSIS — G4700 Insomnia, unspecified: Secondary | ICD-10-CM | POA: Diagnosis not present

## 2013-04-22 DIAGNOSIS — N390 Urinary tract infection, site not specified: Secondary | ICD-10-CM | POA: Diagnosis not present

## 2013-05-20 DIAGNOSIS — E119 Type 2 diabetes mellitus without complications: Secondary | ICD-10-CM | POA: Diagnosis not present

## 2013-05-20 DIAGNOSIS — E7800 Pure hypercholesterolemia, unspecified: Secondary | ICD-10-CM | POA: Diagnosis not present

## 2013-05-20 DIAGNOSIS — I10 Essential (primary) hypertension: Secondary | ICD-10-CM | POA: Diagnosis not present

## 2013-05-20 DIAGNOSIS — G4700 Insomnia, unspecified: Secondary | ICD-10-CM | POA: Diagnosis not present

## 2013-06-17 DIAGNOSIS — L219 Seborrheic dermatitis, unspecified: Secondary | ICD-10-CM | POA: Diagnosis not present

## 2013-06-17 DIAGNOSIS — Z85828 Personal history of other malignant neoplasm of skin: Secondary | ICD-10-CM | POA: Diagnosis not present

## 2013-06-17 DIAGNOSIS — D485 Neoplasm of uncertain behavior of skin: Secondary | ICD-10-CM | POA: Diagnosis not present

## 2013-06-17 DIAGNOSIS — L851 Acquired keratosis [keratoderma] palmaris et plantaris: Secondary | ICD-10-CM | POA: Diagnosis not present

## 2013-06-19 DIAGNOSIS — Z96659 Presence of unspecified artificial knee joint: Secondary | ICD-10-CM | POA: Diagnosis not present

## 2013-06-24 DIAGNOSIS — E7800 Pure hypercholesterolemia, unspecified: Secondary | ICD-10-CM | POA: Diagnosis not present

## 2013-06-24 DIAGNOSIS — E119 Type 2 diabetes mellitus without complications: Secondary | ICD-10-CM | POA: Diagnosis not present

## 2013-06-26 DIAGNOSIS — I10 Essential (primary) hypertension: Secondary | ICD-10-CM | POA: Diagnosis not present

## 2013-06-26 DIAGNOSIS — E7800 Pure hypercholesterolemia, unspecified: Secondary | ICD-10-CM | POA: Diagnosis not present

## 2013-06-26 DIAGNOSIS — E119 Type 2 diabetes mellitus without complications: Secondary | ICD-10-CM | POA: Diagnosis not present

## 2013-07-07 DIAGNOSIS — M1710 Unilateral primary osteoarthritis, unspecified knee: Secondary | ICD-10-CM | POA: Diagnosis not present

## 2013-07-07 DIAGNOSIS — I10 Essential (primary) hypertension: Secondary | ICD-10-CM | POA: Diagnosis not present

## 2013-07-07 DIAGNOSIS — IMO0002 Reserved for concepts with insufficient information to code with codable children: Secondary | ICD-10-CM | POA: Diagnosis not present

## 2013-07-07 DIAGNOSIS — I498 Other specified cardiac arrhythmias: Secondary | ICD-10-CM | POA: Diagnosis not present

## 2013-07-07 DIAGNOSIS — E119 Type 2 diabetes mellitus without complications: Secondary | ICD-10-CM | POA: Diagnosis not present

## 2013-07-08 ENCOUNTER — Ambulatory Visit (INDEPENDENT_AMBULATORY_CARE_PROVIDER_SITE_OTHER): Payer: Medicare Other | Admitting: Cardiology

## 2013-07-08 ENCOUNTER — Encounter: Payer: Self-pay | Admitting: Cardiology

## 2013-07-08 ENCOUNTER — Encounter: Payer: Self-pay | Admitting: *Deleted

## 2013-07-08 VITALS — BP 110/68 | HR 70 | Ht 64.0 in | Wt 151.0 lb

## 2013-07-08 DIAGNOSIS — Z01818 Encounter for other preprocedural examination: Secondary | ICD-10-CM | POA: Diagnosis not present

## 2013-07-08 DIAGNOSIS — I10 Essential (primary) hypertension: Secondary | ICD-10-CM

## 2013-07-08 NOTE — Patient Instructions (Addendum)
Your physician wants you to follow-up in: [place] will receive a reminder letter in the mail two months in advance. If you don't receive a letter, please call our office to schedule the follow-up appointment.  WE WILL SEND YOUR PRE-OP CLEARANCE LETTER TO DR Wynelle Link AT [facility]

## 2013-07-08 NOTE — Progress Notes (Signed)
Patient ID: DOREENA MAULDEN, female   DOB: 07/15/1940, 73 y.o.   MRN: [id number]    Patient Name: Renee [name] Date of Encounter: 07/08/2013  Primary Care Provider:  Horatio Pel, MD Primary Cardiologist:  Dorothy Spark   Patient Profile  Tachycardia  Problem List   Past Medical History  Diagnosis Date  . Blood transfusion 1948  . Cataract     both eyes  . Hyperlipidemia   . Hypertension   . Fibromyalgia   . Arthritis     oa  . DVT (deep venous thrombosis) 2002    left knee  . GERD (gastroesophageal reflux disease)   . Diverticulosis   . Measles as child  . Mumps age 22  . COPD (chronic obstructive pulmonary disease)   . Breast cancer 1994    bilateral  . Disc disease, degenerative, cervical     trouble turing head and neck at times  . PONV (postoperative nausea and vomiting)    Past Surgical History  Procedure Laterality Date  . Colonoscopy    . Polypectomy    . Partial hysterectomy    . Mastectomy      bilateral  . Knee surgery  2002    arthroscopy-bilateral  . Abdominal hysterectomy  1976    partial  . Back surgery  1976    lower  . Bilateral mastectomy with tramflap reconstruction  1994  . Knee arthroscopy Right 2008  . Achilles tendon adn 2 bone spurs removed Left 05-2012  . 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 Aspirin Nausea Only  . Codeine Itching    Mood changes, can take percocet  . Ibuprofen Hives  . Lisinopril Cough   HPI  73 year old female with past medical history of hypertension hyperlipidemia, who recently underwent total knee replacement and was found to be tachycardiac on multiple occasions.  The patient was seen by her primary care physician yesterday and because of the elevated d-dimer, a venous left lower extremity duplex was  ordered and was negative for acute or chronic DVT. The patient is scheduled for chest CT today to rule out pulmonary embolism. The patient states that she feels significantly short of breath, simple tasks of daily living such as taking shower make her short of breath and sweats profusely. She denies any chest pain, palpitations, or syncope.  Patient is coming after 6 monthsAnd states that she is feeling great. Since she started to take metoprolol her shortness of breath has resolved. Since January of this year she was also able to lose 27 pounds by controlling her diet. She is scheduled for a right knee replacement in September of this year and will not need a preop evaluation. She currently denies any palpitations, chest pain, palpitations, lower extremity edema orthopnea or personal nocturnal dyspnea.   Home Medications  Prior to Admission medications   Medication Sig Start Date End Date Taking? Authorizing Provider  DULoxetine (CYMBALTA) 60 MG capsule Take 60 mg by mouth every evening.   Yes Historical Provider, MD  ezetimibe (ZETIA) 10 MG tablet Take 10 mg by mouth every evening.    Yes Historical Provider, MD  HYDROmorphone (DILAUDID) 2 MG tablet Take 1-2 tablets (2-4 mg total) by mouth every 3 (three) hours as needed. 10/16/12  Yes Alexzandrew Dara Lords, PA-C  losartan-hydrochlorothiazide (HYZAAR) 100-25 MG  per tablet Take 0.5 tablets by mouth every morning.    Yes Historical Provider, MD  methocarbamol (ROBAXIN) 500 MG tablet Take 1 tablet (500 mg total) by mouth every 6 (six) hours as needed. 10/16/12  Yes Alexzandrew Dara Lords, PA-C  rivaroxaban (XARELTO) 10 MG TABS tablet Take 1 tablet (10 mg total) by mouth daily with breakfast. Take Xarelto for two and a half more weeks, then discontinue Xarelto. Once the patient has completed the Xarelto, they may resume the 81 mg Aspirin. 10/16/12  Yes Alexzandrew Dara Lords, PA-C  rosuvastatin (CRESTOR) 10 MG tablet Take 10 mg by mouth every evening.    Yes  Historical Provider, MD    Family History  Family History  Problem Relation Age of Onset  . Heart disease Maternal Grandfather     CAD  . Alzheimer's disease Mother     Social History  History   Social History  . Marital Status: Married    Spouse Name: N/A    Number of Children: 2  . Years of Education: N/A   Occupational History  . RETIRED    Social History Main Topics  . Smoking status: Former Smoker -- 1.00 packs/day for 20 years    Types: Cigarettes    Quit date: 02/11/1992  . Smokeless tobacco: Never Used  . Alcohol Use: 3.0 oz/week    5 Glasses of wine per week     Comment: occasional wine  . Drug Use: No  . Sexual Activity: Not on file   Other Topics Concern  . Not on file   Social History Narrative  . No narrative on file     Review of Systems as per HPI General:  No chills, fever, night sweats or weight changes.  Cardiovascular:  No chest pain, dyspnea on exertion, edema, orthopnea, palpitations, paroxysmal nocturnal dyspnea. Dermatological: No rash, lesions/masses Respiratory: No cough, dyspnea Urologic: No hematuria, dysuria Abdominal:   No nausea, vomiting, diarrhea, bright red blood per rectum, melena, or hematemesis Neurologic:  No visual changes, wkns, changes in mental status. All other systems reviewed and are otherwise negative except as noted above.  Physical Exam  BP 138/70, HR 119  General: Pleasant, NAD Psych: Normal affect. Neuro: Alert and oriented X 3. Moves all extremities spontaneously. HEENT: Normal  Neck: Supple without bruits or JVD. Lungs:  Resp regular and unlabored, CTA. Heart: RRR no s3, s4, or murmurs. Abdomen: Soft, non-tender, non-distended, BS + x 4.  Extremities: No clubbing, cyanosis or edema. DP/PT/Radials 2+ and equal bilaterally.  Accessory Clinical Findings  ECG - sinus tachycardia, 114 beats per minute, incomplete right bundle branch block  LE venous Duplex  - No evidence of deep vein or superficial  thrombosis involving the left lower extremity and right common femoral vein. An enlarged inguinal lymph node is noted on the left. - No evidence of Baker's cyst on the left. Other specific details can be found in the table(s) above. Prepared and Electronically Authenticated by  Elam Dutch 2014-10-20T19:[id number]  CT PE IMPRESSION: Negative for pulmonary embolism. No acute chest abnormalities.  Several punctate pulmonary nodules in both lungs. Nodules measure between 2-3 mm and are nonspecific. If the patient is at high risk for bronchogenic carcinoma, follow-up chest CT at 1year is recommended. If the patient is at low risk, no follow-up is needed. This recommendation follows the consensus statement: Guidelines for Management of Small Pulmonary Nodules Detected on CT Scans: A Statement from the [place] as published in Radiology 2005; 237:395-400.  Electronically Signed By:  Markus Daft M.D. On: 11/19/2012 15:52  TTE 12/03/2012 Study Conclusions  - Left ventricle: The cavity size was normal. Wall thickness was increased in a pattern of moderate LVH. Systolic function was normal. The estimated ejection fraction was in the range of 60% to 65%. - Atrial septum: No defect or patent foramen ovale was identified. - Pericardium, extracardiac: A trivial pericardial effusion was identified.   Exercise nuclear stress test: 12/24/12  Quantitative Gated Spect Images  QGS EDV: 60 ml  QGS ESV: 17 ml  Impression  Exercise Capacity: Poor exercise capacity.  BP Response: Hypertensive blood pressure response.  Clinical Symptoms: Dyspnea  ECG Impression: PACs, ventricular couplet. No ischemic ST segment changes.  Comparison with Prior Nuclear Study: No significant change from previous study  Overall Impression: Normal stress nuclear study.  LV Ejection Fraction: 72%. LV Wall Motion: NL LV Function; NL Wall Motion  Loralie Champagne  12/24/2012   Assessment &  Plan  73 year old female post left knee replacement with    1. Sinus tachycardia. Chest CT ruled out pulmonary embolism. Tachycardia has resolved with Metoprolol 25 mg po bid.  2. DOE - exercise nuclear stress test showed good LV EF and no ischemia, but low exercise capacity, with hypertensive response and SOB after a short exercise time. Her echo shows moderate LVH concerning for longstanding HTN. B. Increase metoprolol to 50 mg by mouth twice a day and symptoms of shortness of breath have resolved. She is currently limited to perform moderate exercise because of her knee pain however she is definitely able to achieve at least 4 METS. She has no symptoms of heart failure. In her exercise stress test was negative for ischemia.  Currently there is no contraindication for this patient to undergo knee replacement and in fact she is considered to be a low risk for intermediate risk surgery. We will recommend to continue taking metoprolol in the perioperative period.  Follow up in 1 year.   Dorothy Spark, MD 07/08/2013, 10:41 AM

## 2013-08-12 DIAGNOSIS — D485 Neoplasm of uncertain behavior of skin: Secondary | ICD-10-CM | POA: Diagnosis not present

## 2013-08-12 DIAGNOSIS — L219 Seborrheic dermatitis, unspecified: Secondary | ICD-10-CM | POA: Diagnosis not present

## 2013-09-08 DIAGNOSIS — M25569 Pain in unspecified knee: Secondary | ICD-10-CM | POA: Diagnosis not present

## 2013-09-09 DIAGNOSIS — M1710 Unilateral primary osteoarthritis, unspecified knee: Secondary | ICD-10-CM | POA: Diagnosis not present

## 2013-09-09 DIAGNOSIS — Z96659 Presence of unspecified artificial knee joint: Secondary | ICD-10-CM | POA: Diagnosis not present

## 2013-09-23 DIAGNOSIS — N390 Urinary tract infection, site not specified: Secondary | ICD-10-CM | POA: Diagnosis not present

## 2013-09-23 DIAGNOSIS — I10 Essential (primary) hypertension: Secondary | ICD-10-CM | POA: Diagnosis not present

## 2013-09-23 DIAGNOSIS — E119 Type 2 diabetes mellitus without complications: Secondary | ICD-10-CM | POA: Diagnosis not present

## 2013-09-25 DIAGNOSIS — E119 Type 2 diabetes mellitus without complications: Secondary | ICD-10-CM | POA: Diagnosis not present

## 2013-09-25 DIAGNOSIS — I10 Essential (primary) hypertension: Secondary | ICD-10-CM | POA: Diagnosis not present

## 2013-09-25 DIAGNOSIS — E7800 Pure hypercholesterolemia, unspecified: Secondary | ICD-10-CM | POA: Diagnosis not present

## 2013-10-09 ENCOUNTER — Encounter (HOSPITAL_COMMUNITY): Payer: Self-pay

## 2013-10-09 ENCOUNTER — Other Ambulatory Visit: Payer: Self-pay | Admitting: Orthopedic Surgery

## 2013-10-09 NOTE — Progress Notes (Signed)
Need orders in EPIC please - PT COMING FOR PST MON 10/13/13 @ 9:00 AM - Thank you

## 2013-10-09 NOTE — Patient Instructions (Addendum)
Renee [name]  10/09/2013                           YOUR PROCEDURE IS SCHEDULED ON: 10/20/13               ENTER THRU [facility] ENTRANCE AND                            FOLLOW  SIGNS TO SHORT STAY CENTER                 ARRIVE AT SHORT STAY AT: 7:30 am               CALL THIS NUMBER IF ANY PROBLEMS THE DAY OF SURGERY :               [phone]                                REMEMBER:   Do not eat food or drink liquids AFTER MIDNIGHT                  Take these medicines the morning of surgery with               A SIPS OF WATER :   METOPROLOL / VICODIN IF NEEDED      Do not wear jewelry, make-up   Do not wear lotions, powders, or perfumes.   Do not shave legs or underarms 12 hrs. before surgery (men may shave face)  Do not bring valuables to the hospital.  Contacts, dentures or bridgework may not be worn into surgery.  Leave suitcase in the car. After surgery it may be brought to your room.  For patients admitted to the hospital more than one night, checkout time is            11:00 AM                                            ________________________________________________________________________                                                                        Renee [name]  Before surgery, you can play an important role.  Because skin is not sterile, your skin needs to be as free of germs as possible.  You can reduce the number of germs on your skin by washing with CHG (chlorahexidine gluconate) soap before surgery.  CHG is an antiseptic cleaner which kills germs and bonds with the skin to continue killing germs even after washing. Please DO NOT use if you have an allergy to CHG or antibacterial soaps.  If your skin becomes reddened/irritated stop using the CHG and inform your nurse when you arrive at Short Stay. Do not shave (including legs and underarms) for at least 48 hours prior to the first CHG shower.  You  may shave your face. Please follow these instructions carefully:  1.  Shower with CHG Soap the night before surgery and the  morning of Surgery.   2.  If you choose to wash your hair, wash your hair first as usual with your  normal  Shampoo.   3.  After you shampoo, rinse your hair and body thoroughly to remove the  shampoo.                                         4.  Use CHG as you would any other liquid soap.  You can apply chg directly  to the skin and wash . Gently wash with scrungie or clean wascloth    5.  Apply the CHG Soap to your body ONLY FROM THE NECK DOWN.   Do not use on open                           Wound or open sores. Avoid contact with eyes, ears mouth and genitals (private parts).                        Genitals (private parts) with your normal soap.              6.  Wash thoroughly, paying special attention to the area where your surgery  will be performed.   7.  Thoroughly rinse your body with warm water from the neck down.   8.  DO NOT shower/wash with your normal soap after using and rinsing off  the CHG Soap .                9.  Pat yourself dry with a clean towel.             10.  Wear clean pajamas.             11.  Place clean sheets on your bed the night of your first shower and do not  sleep with pets.  Day of Surgery : Do not apply any lotions/deodorants the morning of surgery.  Please wear clean clothes to the hospital/surgery center.  FAILURE TO FOLLOW THESE INSTRUCTIONS MAY RESULT IN THE CANCELLATION OF YOUR SURGERY    PATIENT SIGNATURE_________________________________  ______________________________________________________________________     Renee [name]  An incentive spirometer is a tool that can help keep your lungs clear and active. This tool measures how well you are filling your lungs with each breath. Taking long deep breaths may help reverse or decrease the chance of developing breathing (pulmonary) problems (especially  infection) following:  A long period of time when you are unable to move or be active. BEFORE THE PROCEDURE   If the spirometer includes an indicator to show your best effort, your nurse or respiratory therapist will set it to a desired goal.  If possible, sit up straight or lean slightly forward. Try not to slouch.  Hold the incentive spirometer in an upright position. INSTRUCTIONS FOR USE  1. Sit on the edge of your bed if possible, or sit up as far as you can in bed or on a chair. 2. Hold the incentive spirometer in an upright position. 3. Breathe out normally. 4. Place the mouthpiece in your mouth and seal your lips tightly around it. 5. Breathe in slowly and as deeply as possible, raising the piston or the ball toward the top  of the column. 6. Hold your breath for 3-5 seconds or for as long as possible. Allow the piston or ball to fall to the bottom of the column. 7. Remove the mouthpiece from your mouth and breathe out normally. 8. Rest for a few seconds and repeat Steps 1 through 7 at least 10 times every 1-2 hours when you are awake. Take your time and take a few normal breaths between deep breaths. 9. The spirometer may include an indicator to show your best effort. Use the indicator as a goal to work toward during each repetition. 10. After each set of 10 deep breaths, practice coughing to be sure your lungs are clear. If you have an incision (the cut made at the time of surgery), support your incision when coughing by placing a pillow or rolled up towels firmly against it. Once you are able to get out of bed, walk around indoors and cough well. You may stop using the incentive spirometer when instructed by your caregiver.  RISKS AND COMPLICATIONS  Take your time so you do not get dizzy or light-headed.  If you are in pain, you may need to take or ask for pain medication before doing incentive spirometry. It is harder to take a deep breath if you are having pain. AFTER  USE  Rest and breathe slowly and easily.  It can be helpful to keep track of a log of your progress. Your caregiver can provide you with a simple table to help with this. If you are using the spirometer at home, follow these instructions: Renee [name] IF:   You are having difficultly using the spirometer.  You have trouble using the spirometer as often as instructed.  Your pain medication is not giving enough relief while using the spirometer.  You develop fever of 100.5 F (38.1 C) or higher. SEEK IMMEDIATE MEDICAL CARE IF:   You cough up bloody sputum that had not been present before.  You develop fever of 102 F (38.9 C) or greater.  You develop worsening pain at or near the incision site. MAKE SURE YOU:   Understand these instructions.  Will watch your condition.  Will get help right away if you are not doing well or get worse. Document Released: 05/29/2006 Document Revised: 04/10/2011 Document Reviewed: 07/30/2006 Exit[employer] Patient Information 2014 [employer], [place].   ________________________________________________________________________  WHAT IS A BLOOD TRANSFUSION? Blood Transfusion Information  A transfusion is the replacement of blood or some of its parts. Blood is made up of multiple cells which provide different functions.  Red blood cells carry oxygen and are used for blood loss replacement.  White blood cells fight against infection.  Platelets control bleeding.  Plasma helps clot blood.  Other blood products are available for specialized needs, such as hemophilia or other clotting disorders. BEFORE THE TRANSFUSION  Who gives blood for transfusions?   Healthy volunteers who are fully evaluated to make sure their blood is safe. This is blood bank blood. Transfusion therapy is the safest it has ever been in the practice of medicine. Before blood is taken from a donor, a complete history is taken to make sure that person has no history of diseases  nor engages in risky social behavior (examples are intravenous drug use or sexual activity with multiple partners). The donor's travel history is screened to minimize risk of transmitting infections, such as malaria. The donated blood is tested for signs of infectious diseases, such as HIV and hepatitis. The blood is then tested to  be sure it is compatible with you in order to minimize the chance of a transfusion reaction. If you or a relative donates blood, this is often done in anticipation of surgery and is not appropriate for emergency situations. It takes many days to process the donated blood. RISKS AND COMPLICATIONS Although transfusion therapy is very safe and saves many lives, the main dangers of transfusion include:   Getting an infectious disease.  Developing a transfusion reaction. This is an allergic reaction to something in the blood you were given. Every precaution is taken to prevent this. The decision to have a blood transfusion has been considered carefully by your caregiver before blood is given. Blood is not given unless the benefits outweigh the risks. AFTER THE TRANSFUSION  Right after receiving a blood transfusion, you will usually feel much better and more energetic. This is especially true if your red blood cells have gotten low (anemic). The transfusion raises the level of the red blood cells which carry oxygen, and this usually causes an energy increase.  The nurse administering the transfusion will monitor you carefully for complications. HOME CARE INSTRUCTIONS  No special instructions are needed after a transfusion. You may find your energy is better. Speak with your caregiver about any limitations on activity for underlying diseases you may have. SEEK MEDICAL CARE IF:   Your condition is not improving after your transfusion.  You develop redness or irritation at the intravenous (IV) site. SEEK IMMEDIATE MEDICAL CARE IF:  Any of the following symptoms occur over the  next 12 hours:  Shaking chills.  You have a temperature by mouth above 102 F (38.9 C), not controlled by medicine.  Chest, back, or muscle pain.  People around you feel you are not acting correctly or are confused.  Shortness of breath or difficulty breathing.  Dizziness and fainting.  You get a rash or develop hives.  You have a decrease in urine output.  Your urine turns a dark color or changes to pink, red, or brown. Any of the following symptoms occur over the next 10 days:  You have a temperature by mouth above 102 F (38.9 C), not controlled by medicine.  Shortness of breath.  Weakness after normal activity.  The white part of the eye turns yellow (jaundice).  You have a decrease in the amount of urine or are urinating less often.  Your urine turns a dark color or changes to pink, red, or brown. Document Released: 01/14/2000 Document Revised: 04/10/2011 Document Reviewed: 09/02/2007 [facility] Patient Information 2014 [place], [place].  _______________________________________________________________________

## 2013-10-09 NOTE — Progress Notes (Signed)
Preoperative surgical orders have been place into the Epic hospital system for Renee [name] on 10/09/2013, 5:12 PM  by Mickel Crow for surgery on 10/20/2013.  Preop Total Knee orders including Experal, IV Tylenol, and IV Decadron as long as there are no contraindications to the above medications. Arlee Muslim, PA-C

## 2013-10-13 ENCOUNTER — Encounter (HOSPITAL_COMMUNITY): Payer: Self-pay

## 2013-10-13 ENCOUNTER — Encounter (HOSPITAL_COMMUNITY)
Admission: RE | Admit: 2013-10-13 | Discharge: 2013-10-13 | Disposition: A | Discharge status: Home / Self Care | Payer: Medicare Other | Source: Ambulatory Visit | Attending: Orthopedic Surgery | Admitting: Orthopedic Surgery

## 2013-10-13 ENCOUNTER — Encounter (HOSPITAL_COMMUNITY): Payer: Self-pay | Admitting: Pharmacy Technician

## 2013-10-13 DIAGNOSIS — Z01812 Encounter for preprocedural laboratory examination: Secondary | ICD-10-CM | POA: Diagnosis not present

## 2013-10-13 DIAGNOSIS — M1710 Unilateral primary osteoarthritis, unspecified knee: Secondary | ICD-10-CM | POA: Diagnosis not present

## 2013-10-13 DIAGNOSIS — Z7901 Long term (current) use of anticoagulants: Secondary | ICD-10-CM | POA: Diagnosis not present

## 2013-10-13 HISTORY — DX: Tachycardia, unspecified: R00.0

## 2013-10-13 HISTORY — DX: Personal history of other malignant neoplasm of skin: Z85.828

## 2013-10-13 LAB — URINALYSIS, ROUTINE W REFLEX MICROSCOPIC
Bilirubin Urine: NEGATIVE
Glucose, UA: NEGATIVE mg/dL
Hgb urine dipstick: NEGATIVE
Ketones, ur: NEGATIVE mg/dL
Nitrite: NEGATIVE
Protein, ur: NEGATIVE mg/dL
Specific Gravity, Urine: 1.021 (ref 1.005–1.030)
Urobilinogen, UA: 0.2 mg/dL (ref 0.0–1.0)
pH: 7.5 (ref 5.0–8.0)

## 2013-10-13 LAB — URINE MICROSCOPIC-ADD ON

## 2013-10-13 LAB — COMPREHENSIVE METABOLIC PANEL
ALT: 10 U/L (ref 0–35)
AST: 21 U/L (ref 0–37)
Albumin: 4.1 g/dL (ref 3.5–5.2)
Alkaline Phosphatase: 73 U/L (ref 39–117)
Anion gap: 10 (ref 5–15)
BUN: 13 mg/dL (ref 6–23)
CO2: 29 mEq/L (ref 19–32)
Calcium: 10 mg/dL (ref 8.4–10.5)
Chloride: 99 mEq/L (ref 96–112)
Creatinine, Ser: 0.69 mg/dL (ref 0.50–1.10)
GFR calc Af Amer: >90 mL/min (ref >90)
GFR calc non Af Amer: 84 mL/min — ABNORMAL LOW (ref >90)
Glucose, Bld: 149 mg/dL — ABNORMAL HIGH (ref 70–99)
Potassium: 4.8 mEq/L (ref 3.7–5.3)
Sodium: 138 mEq/L (ref 137–147)
Total Bilirubin: 0.6 mg/dL (ref 0.3–1.2)
Total Protein: 7.4 g/dL (ref 6.0–8.3)

## 2013-10-13 LAB — SURGICAL PCR SCREEN
MRSA, PCR: NEGATIVE
Staphylococcus aureus: NEGATIVE

## 2013-10-13 LAB — CBC
HCT: 40.5 % (ref 36.0–46.0)
Hemoglobin: 13.8 g/dL (ref 12.0–15.0)
MCH: 32.5 pg (ref 26.0–34.0)
MCHC: 34.1 g/dL (ref 30.0–36.0)
MCV: 95.5 fL (ref 78.0–100.0)
Platelets: 227 10*3/uL (ref 150–400)
RBC: 4.24 MIL/uL (ref 3.87–5.11)
RDW: 12.1 % (ref 11.5–15.5)
WBC: 4.9 10*3/uL (ref 4.0–10.5)

## 2013-10-13 LAB — PROTIME-INR
INR: 0.98 (ref 0.00–1.49)
Prothrombin Time: 13 seconds (ref 11.6–15.2)

## 2013-10-13 LAB — APTT: aPTT: 32 seconds (ref 24–37)

## 2013-10-16 ENCOUNTER — Other Ambulatory Visit: Payer: Self-pay | Admitting: Cardiology

## 2013-10-17 ENCOUNTER — Other Ambulatory Visit: Payer: Self-pay

## 2013-10-19 ENCOUNTER — Other Ambulatory Visit: Payer: Self-pay | Admitting: Orthopedic Surgery

## 2013-10-19 NOTE — H&P (Signed)
Early Osmond DOB: 07-25-1940 Married / Language: English / Race: White Female Date of Admission:  10/20/2013 Chief Complaint:  Right Knee Pain History of Present Illness The patient is a 73 year old female who comes in for a preoperative History and Physical. The patient is scheduled for a right total knee arthroplasty to be performed by Dr. Dione Plover. Aluisio, MD at [facility] on 10/20/2013. The right knee she had a cortisone injection a little less than seven months ago. She says that she was doing well until about a month ago. She has had no injuries. She is having pain more medial than lateral. She says she is having minimal discomfort at rest but significant discomfort with weightbearing. No locking or catching of the knee. Minimal trouble with swelling. She also started getting some discomfort over the right greater trochanteric bursa. She says this occurs if she sits for a long period of time and then gets up from that seated position. Also has issues when she is laying on that side at night. She denies groin pain, no low back pain, no numbness or tingling in the legs. She is five months out from a left total knee. She is doing extremely with that. She says occasionally she will be tender along the medial aspect of the knee and have some stiffness but if she is diligent with her exercises that is minimal. She has now reached a point where she would like to get the right knee replaced. She has done well with the left knee and is now ready to proceed with the right side. They have been treated conservatively in the past for the above stated problem and despite conservative measures, they continue to have progressive pain and severe functional limitations and dysfunction. They have failed non-operative management including home exercise, medications, and injections. It is felt that they would benefit from undergoing total joint replacement. Risks and benefits of the procedure have been  discussed with the patient and they elect to proceed with surgery. There are no active contraindications to surgery such as ongoing infection or rapidly progressive neurological disease.  Problem List/Past Medical Status post total left knee replacement (V43.65  [id number]) Primary osteoarthritis of right knee (715.16  M17.11)  Allergies Codeine Derivatives Itching. Mood Changes (Vicodin) Lisinopril *ANTIHYPERTENSIVES* Cough. Ibuprofen *ANALGESICS - ANTI-INFLAMMATORY* blisters  Family History No pertinent family history First Degree Relatives.  Social History Children 2 Drug/Alcohol Rehab (Previously) no Living situation live with spouse Current work status retired Engineer, agricultural (Currently) no Alcohol use current drinker; drinks wine; only occasionally per week Marital status married Exercise Exercises rarely; does running / walking Illicit drug use no Tobacco / smoke exposure no Number of flights of stairs before winded greater than 5 Pain Contract no Tobacco use Former smoker. former smoker; smoke(d) less than 1/2 pack(s) per day Post-Surgical Plans Home  Medication History Metoprolol Tartrate (50MG  Tablet, Oral) Active. Losartan Potassium-HCTZ (100-25MG  Tablet, Oral) Active. Cymbalta (60MG  Capsule DR Part, Oral) Active. Atorvastatin Calcium (20MG  Tablet, Oral) Active.   Past Surgical History  Arthroscopy of Knee bilateral Arthroscopic Knee Surgery - Right Date: 2008. Arthroscopic Knee Surgery - Left Date: 2002. 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 partial (non-cancerous) Colon Polyp Removal - Colonoscopy Foot Surgery right Appendectomy Date: 1960. Spinal Surgery Date: 98. Mammoplasty; Reduction bilateral Tonsillectomy Total Knee Replacement - Left Date: 09/2012.  Past Medical History Measles Mumps Transfusion history 1948 Urinary Tract  Infection Diverticulosis Osteoporosis Hypercholesterolemia Gastroesophageal Reflux Disease DVT  Postop Left Knee Scope - 2002 Osteoarthritis Breast Cancer Bilateral Fibromyalgia COPD Impaired Vision reading glasses Hypertension Menopause Diet-Controlled Diabetes Mellitus Carotid atherosclerosis (433.10  I65.29) Mild Narrowing Seborrheic Keratosis  Review of Systems  General Not Present- Chills, Fatigue, Fever, Memory Loss, Night Sweats, Weight Gain and Weight Loss. Skin Not Present- Eczema, Hives, Itching, Lesions and Rash. HEENT Not Present- Dentures, Double Vision, Headache, Hearing Loss, Tinnitus and Visual Loss. Respiratory Not Present- Allergies, Chronic Cough, Coughing up blood, Shortness of breath at rest and Shortness of breath with exertion. Cardiovascular Not Present- Chest Pain, Difficulty Breathing Lying Down, Murmur, Palpitations, Racing/skipping heartbeats and Swelling. Gastrointestinal Not Present- Abdominal Pain, Bloody Stool, Constipation, Diarrhea, Difficulty Swallowing, Heartburn, Jaundice, Loss of appetitie, Nausea and Vomiting. Female Genitourinary Not Present- Blood in Urine, Discharge, Flank Pain, Incontinence, Painful Urination, Urgency, Urinary frequency, Urinary Retention, Urinating at Night and Weak urinary stream. Musculoskeletal Present- Joint Pain, Joint Swelling, Morning Stiffness and Spasms. Not Present- Back Pain, Muscle Pain and Muscle Weakness. Neurological Not Present- Blackout spells, Difficulty with balance, Dizziness, Paralysis, Tremor and Weakness. Psychiatric Not Present- Insomnia.   Vitals Weight: 140 lb Height: 64in Weight was reported by patient. Height was reported by patient. Body Surface Area: 1.69 m Body Mass Index: 24.03 kg/m BP: 122/58 (Sitting, Left Arm, Standard)    Physical Exam  General Mental Status -Alert, cooperative and good historian. General Appearance-pleasant, Not in acute  distress. Orientation-Oriented X3. Build & Nutrition-Well nourished and Well developed.  Head and Neck Head-normocephalic, atraumatic . Neck Global Assessment - supple, no bruit auscultated on the right, no bruit auscultated on the left.  Eye Pupil - Bilateral-Regular and Round. Motion - Bilateral-EOMI.  Chest and Lung Exam Auscultation Breath sounds - clear at anterior chest wall and clear at posterior chest wall. Adventitious sounds - No Adventitious sounds.  Cardiovascular Auscultation Rhythm - Regular rate and rhythm. Heart Sounds - S1 WNL and S2 WNL. Murmurs & Other Heart Sounds - Auscultation of the heart reveals - No Murmurs.  Abdomen Palpation/Percussion Tenderness - Abdomen is non-tender to palpation. Rigidity (guarding) - Abdomen is soft. Auscultation Auscultation of the abdomen reveals - Bowel sounds normal.  Female Genitourinary Note: Not done, not pertinent to present illness   Musculoskeletal Note: The right knee is tender medially greater than laterally. There is no effusion about the knee. Moderate patellofemoral crepitus. She does have varus deformity. Minimal discomfort with passive range of motion. She can fully extend, flexion back to 120 degrees. She does have normal painless range of motion in the hips however she is tender along the right greater trochanteric bursa. No masses or tumors palpated. The left knee incision is healed. No signs of infection. There is no effusion. No erythema. Range of motion is 0 to 125 without difficulty. No tenderness to palpation. Calves are soft, nontender. Distal pulses are 2+. Sensation and motor function are intact in the lower extremities.  RADIOGRAPHS: AP and lateral views of the knees show the prosthesis in left total knee in excellent position. No periprosthetic abnormalities. The right knee is bone on bone in that medial compartment with osteophyte formation along the medial femoral condyle. Also has  significant narrowing in the patellofemoral compartment.   Assessment & Plan  Primary osteoarthritis of right knee (715.16  M17.11)  Note:Plan is for a Right Total Knee Replacement by Dr. Wynelle Link.  Plan is to go home.  PCP - Dr. Deland Pretty  Topical TXA - History of DVT  Signed electronically by Joelene Millin, III PA-C

## 2013-10-20 ENCOUNTER — Encounter (HOSPITAL_COMMUNITY)
Admission: RE | Disposition: A | Discharge status: Home Health | Payer: Self-pay | Source: Ambulatory Visit | Attending: Orthopedic Surgery

## 2013-10-20 ENCOUNTER — Inpatient Hospital Stay (HOSPITAL_COMMUNITY): Payer: Medicare Other | Admitting: Anesthesiology

## 2013-10-20 ENCOUNTER — Encounter (HOSPITAL_COMMUNITY): Payer: Self-pay | Admitting: *Deleted

## 2013-10-20 ENCOUNTER — Encounter (HOSPITAL_COMMUNITY): Payer: Medicare Other | Admitting: Anesthesiology

## 2013-10-20 ENCOUNTER — Inpatient Hospital Stay (HOSPITAL_COMMUNITY)
Admission: RE | Admit: 2013-10-20 | Discharge: 2013-10-22 | DRG: 470 | Disposition: A | Discharge status: Home Health | Payer: Medicare Other | Source: Ambulatory Visit | Attending: Orthopedic Surgery | Admitting: Orthopedic Surgery

## 2013-10-20 DIAGNOSIS — E119 Type 2 diabetes mellitus without complications: Secondary | ICD-10-CM | POA: Diagnosis present

## 2013-10-20 DIAGNOSIS — Z87891 Personal history of nicotine dependence: Secondary | ICD-10-CM | POA: Diagnosis not present

## 2013-10-20 DIAGNOSIS — J4489 Other specified chronic obstructive pulmonary disease: Secondary | ICD-10-CM | POA: Diagnosis not present

## 2013-10-20 DIAGNOSIS — M1710 Unilateral primary osteoarthritis, unspecified knee: Secondary | ICD-10-CM | POA: Diagnosis not present

## 2013-10-20 DIAGNOSIS — IMO0001 Reserved for inherently not codable concepts without codable children: Secondary | ICD-10-CM | POA: Diagnosis present

## 2013-10-20 DIAGNOSIS — J449 Chronic obstructive pulmonary disease, unspecified: Secondary | ICD-10-CM | POA: Diagnosis present

## 2013-10-20 DIAGNOSIS — Z96659 Presence of unspecified artificial knee joint: Secondary | ICD-10-CM

## 2013-10-20 DIAGNOSIS — K219 Gastro-esophageal reflux disease without esophagitis: Secondary | ICD-10-CM | POA: Diagnosis present

## 2013-10-20 DIAGNOSIS — I10 Essential (primary) hypertension: Secondary | ICD-10-CM | POA: Diagnosis present

## 2013-10-20 DIAGNOSIS — E785 Hyperlipidemia, unspecified: Secondary | ICD-10-CM | POA: Diagnosis present

## 2013-10-20 DIAGNOSIS — M25569 Pain in unspecified knee: Secondary | ICD-10-CM | POA: Diagnosis not present

## 2013-10-20 DIAGNOSIS — IMO0002 Reserved for concepts with insufficient information to code with codable children: Secondary | ICD-10-CM

## 2013-10-20 DIAGNOSIS — M179 Osteoarthritis of knee, unspecified: Secondary | ICD-10-CM | POA: Diagnosis present

## 2013-10-20 DIAGNOSIS — M1711 Unilateral primary osteoarthritis, right knee: Secondary | ICD-10-CM

## 2013-10-20 HISTORY — PX: TOTAL KNEE ARTHROPLASTY: SHX125

## 2013-10-20 LAB — TYPE AND SCREEN
ABO/RH(D): B NEG
Antibody Screen: NEGATIVE

## 2013-10-20 SURGERY — ARTHROPLASTY, KNEE, TOTAL
Anesthesia: Spinal | Site: Knee | Laterality: Right

## 2013-10-20 MED ORDER — METOPROLOL TARTRATE 50 MG PO TABS
50.0000 mg | ORAL_TABLET | Freq: Two times a day (BID) | ORAL | Status: DC
  Administered 2013-10-20 – 2013-10-22 (×4): 50 mg via ORAL
  Filled 2013-10-20 (×6): qty 1

## 2013-10-20 MED ORDER — DIPHENHYDRAMINE HCL 12.5 MG/5ML PO ELIX: 12.5000 mg | ORAL_SOLUTION | ORAL | Status: DC | PRN

## 2013-10-20 MED ORDER — HYDROMORPHONE HCL 2 MG PO TABS
2.0000 mg | ORAL_TABLET | ORAL | Status: DC | PRN
  Administered 2013-10-20 – 2013-10-22 (×8): 4 mg via ORAL
  Filled 2013-10-20 (×8): qty 2

## 2013-10-20 MED ORDER — DEXAMETHASONE SODIUM PHOSPHATE 10 MG/ML IJ SOLN
INTRAMUSCULAR | Status: AC
  Filled 2013-10-20: qty 1

## 2013-10-20 MED ORDER — MEPERIDINE HCL 50 MG/ML IJ SOLN: 6.2500 mg | INTRAMUSCULAR | Status: DC | PRN

## 2013-10-20 MED ORDER — TRAMADOL HCL 50 MG PO TABS
50.0000 mg | ORAL_TABLET | Freq: Four times a day (QID) | ORAL | Status: DC | PRN
  Filled 2013-10-20: qty 2

## 2013-10-20 MED ORDER — ACETAMINOPHEN 500 MG PO TABS
1000.0000 mg | ORAL_TABLET | Freq: Four times a day (QID) | ORAL | Status: AC
  Administered 2013-10-20 – 2013-10-21 (×4): 1000 mg via ORAL
  Filled 2013-10-20 (×5): qty 2

## 2013-10-20 MED ORDER — ONDANSETRON HCL 4 MG PO TABS: 4.0000 mg | ORAL_TABLET | Freq: Four times a day (QID) | ORAL | Status: DC | PRN

## 2013-10-20 MED ORDER — EPHEDRINE SULFATE 50 MG/ML IJ SOLN
INTRAMUSCULAR | Status: DC | PRN
  Administered 2013-10-20: 10 mg via INTRAVENOUS

## 2013-10-20 MED ORDER — ONDANSETRON HCL 4 MG/2ML IJ SOLN
INTRAMUSCULAR | Status: AC
  Filled 2013-10-20: qty 2

## 2013-10-20 MED ORDER — METOCLOPRAMIDE HCL 10 MG PO TABS: 5.0000 mg | ORAL_TABLET | Freq: Three times a day (TID) | ORAL | Status: DC | PRN

## 2013-10-20 MED ORDER — PHENOL 1.4 % MT LIQD
1.0000 | OROMUCOSAL | Status: DC | PRN
  Filled 2013-10-20: qty 177

## 2013-10-20 MED ORDER — HYDROMORPHONE HCL 1 MG/ML IJ SOLN
0.5000 mg | INTRAMUSCULAR | Status: DC | PRN
  Administered 2013-10-20 (×2): 0.5 mg via INTRAVENOUS
  Administered 2013-10-21: 1 mg via INTRAVENOUS
  Filled 2013-10-20 (×3): qty 1

## 2013-10-20 MED ORDER — BUPIVACAINE LIPOSOME 1.3 % IJ SUSP
INTRAMUSCULAR | Status: DC | PRN
  Administered 2013-10-20: 20 mL

## 2013-10-20 MED ORDER — SODIUM CHLORIDE 0.9 % IJ SOLN
INTRAMUSCULAR | Status: DC | PRN
  Administered 2013-10-20: 30 mL via INTRAVENOUS

## 2013-10-20 MED ORDER — EPHEDRINE SULFATE 50 MG/ML IJ SOLN
INTRAMUSCULAR | Status: AC
  Filled 2013-10-20: qty 1

## 2013-10-20 MED ORDER — BUPIVACAINE HCL 0.25 % IJ SOLN
INTRAMUSCULAR | Status: DC | PRN
  Administered 2013-10-20: 20 mL

## 2013-10-20 MED ORDER — LOSARTAN POTASSIUM-HCTZ 100-25 MG PO TABS: 0.5000 | ORAL_TABLET | Freq: Every morning | ORAL | Status: DC

## 2013-10-20 MED ORDER — ACETAMINOPHEN 650 MG RE SUPP: 650.0000 mg | Freq: Four times a day (QID) | RECTAL | Status: DC | PRN

## 2013-10-20 MED ORDER — PROPOFOL INFUSION 10 MG/ML OPTIME
INTRAVENOUS | Status: DC | PRN
  Administered 2013-10-20: 75 ug/kg/min via INTRAVENOUS

## 2013-10-20 MED ORDER — MIDAZOLAM HCL 5 MG/5ML IJ SOLN
INTRAMUSCULAR | Status: DC | PRN
  Administered 2013-10-20: 2 mg via INTRAVENOUS

## 2013-10-20 MED ORDER — PROMETHAZINE HCL 25 MG/ML IJ SOLN: 6.2500 mg | INTRAMUSCULAR | Status: DC | PRN

## 2013-10-20 MED ORDER — BUPIVACAINE IN DEXTROSE 0.75-8.25 % IT SOLN
INTRATHECAL | Status: DC | PRN
  Administered 2013-10-20: 1.8 mL via INTRATHECAL

## 2013-10-20 MED ORDER — ONDANSETRON HCL 4 MG/2ML IJ SOLN: 4.0000 mg | Freq: Four times a day (QID) | INTRAMUSCULAR | Status: DC | PRN

## 2013-10-20 MED ORDER — TRANEXAMIC ACID 100 MG/ML IV SOLN
2000.0000 mg | Freq: Once | INTRAVENOUS | Status: DC
  Filled 2013-10-20: qty 20

## 2013-10-20 MED ORDER — PHENYLEPHRINE 40 MCG/ML (10ML) SYRINGE FOR IV PUSH (FOR BLOOD PRESSURE SUPPORT)
PREFILLED_SYRINGE | INTRAVENOUS | Status: AC
  Filled 2013-10-20: qty 10

## 2013-10-20 MED ORDER — LOSARTAN POTASSIUM 50 MG PO TABS
50.0000 mg | ORAL_TABLET | Freq: Every day | ORAL | Status: DC
  Administered 2013-10-21 – 2013-10-22 (×2): 50 mg via ORAL
  Filled 2013-10-20 (×3): qty 1

## 2013-10-20 MED ORDER — SODIUM CHLORIDE 0.9 % IJ SOLN
INTRAMUSCULAR | Status: AC
  Filled 2013-10-20: qty 10

## 2013-10-20 MED ORDER — FENTANYL CITRATE 0.05 MG/ML IJ SOLN: 25.0000 ug | INTRAMUSCULAR | Status: DC | PRN

## 2013-10-20 MED ORDER — ONDANSETRON HCL 4 MG/2ML IJ SOLN
INTRAMUSCULAR | Status: DC | PRN
  Administered 2013-10-20: 4 mg via INTRAVENOUS

## 2013-10-20 MED ORDER — CEFAZOLIN SODIUM-DEXTROSE 2-3 GM-% IV SOLR
2.0000 g | INTRAVENOUS | Status: AC
  Administered 2013-10-20: 2 g via INTRAVENOUS

## 2013-10-20 MED ORDER — FLEET ENEMA 7-19 GM/118ML RE ENEM: 1.0000 | ENEMA | Freq: Once | RECTAL | Status: AC | PRN

## 2013-10-20 MED ORDER — PROPOFOL 10 MG/ML IV BOLUS
INTRAVENOUS | Status: AC
  Filled 2013-10-20: qty 20

## 2013-10-20 MED ORDER — SODIUM CHLORIDE 0.9 % IJ SOLN
INTRAMUSCULAR | Status: AC
  Filled 2013-10-20: qty 50

## 2013-10-20 MED ORDER — BUPIVACAINE HCL (PF) 0.25 % IJ SOLN
INTRAMUSCULAR | Status: AC
  Filled 2013-10-20: qty 30

## 2013-10-20 MED ORDER — LACTATED RINGERS IV SOLN
INTRAVENOUS | Status: DC
  Administered 2013-10-20 (×2): via INTRAVENOUS

## 2013-10-20 MED ORDER — POLYETHYLENE GLYCOL 3350 17 G PO PACK: 17.0000 g | PACK | Freq: Every day | ORAL | Status: DC | PRN

## 2013-10-20 MED ORDER — CEFAZOLIN SODIUM-DEXTROSE 2-3 GM-% IV SOLR
INTRAVENOUS | Status: AC
  Filled 2013-10-20: qty 50

## 2013-10-20 MED ORDER — FENTANYL CITRATE 0.05 MG/ML IJ SOLN
INTRAMUSCULAR | Status: DC | PRN
  Administered 2013-10-20 (×2): 50 ug via INTRAVENOUS

## 2013-10-20 MED ORDER — BISACODYL 10 MG RE SUPP: 10.0000 mg | Freq: Every day | RECTAL | Status: DC | PRN

## 2013-10-20 MED ORDER — BUPIVACAINE LIPOSOME 1.3 % IJ SUSP
20.0000 mL | Freq: Once | INTRAMUSCULAR | Status: DC
  Filled 2013-10-20: qty 20

## 2013-10-20 MED ORDER — MENTHOL 3 MG MT LOZG
1.0000 | LOZENGE | OROMUCOSAL | Status: DC | PRN
  Filled 2013-10-20: qty 9

## 2013-10-20 MED ORDER — DEXAMETHASONE 6 MG PO TABS
10.0000 mg | ORAL_TABLET | Freq: Every day | ORAL | Status: AC
  Administered 2013-10-21: 10 mg via ORAL
  Filled 2013-10-20: qty 1

## 2013-10-20 MED ORDER — PHENYLEPHRINE HCL 10 MG/ML IJ SOLN
INTRAMUSCULAR | Status: DC | PRN
  Administered 2013-10-20 (×4): 80 ug via INTRAVENOUS

## 2013-10-20 MED ORDER — DEXAMETHASONE SODIUM PHOSPHATE 10 MG/ML IJ SOLN
10.0000 mg | Freq: Every day | INTRAMUSCULAR | Status: AC
  Filled 2013-10-20: qty 1

## 2013-10-20 MED ORDER — ATORVASTATIN CALCIUM 20 MG PO TABS
20.0000 mg | ORAL_TABLET | Freq: Every day | ORAL | Status: DC
  Administered 2013-10-20 – 2013-10-21 (×2): 20 mg via ORAL
  Filled 2013-10-20 (×4): qty 1

## 2013-10-20 MED ORDER — CEFAZOLIN SODIUM-DEXTROSE 2-3 GM-% IV SOLR
2.0000 g | Freq: Four times a day (QID) | INTRAVENOUS | Status: AC
  Administered 2013-10-20 (×2): 2 g via INTRAVENOUS
  Filled 2013-10-20 (×2): qty 50

## 2013-10-20 MED ORDER — PROPOFOL 10 MG/ML IV BOLUS
INTRAVENOUS | Status: DC | PRN
  Administered 2013-10-20: 20 mg via INTRAVENOUS

## 2013-10-20 MED ORDER — METHOCARBAMOL 1000 MG/10ML IJ SOLN
500.0000 mg | Freq: Four times a day (QID) | INTRAVENOUS | Status: DC | PRN
  Filled 2013-10-20: qty 5

## 2013-10-20 MED ORDER — DOCUSATE SODIUM 100 MG PO CAPS
100.0000 mg | ORAL_CAPSULE | Freq: Two times a day (BID) | ORAL | Status: DC
  Administered 2013-10-20 – 2013-10-22 (×5): 100 mg via ORAL

## 2013-10-20 MED ORDER — ACETAMINOPHEN 10 MG/ML IV SOLN
1000.0000 mg | Freq: Once | INTRAVENOUS | Status: AC
  Administered 2013-10-20: 1000 mg via INTRAVENOUS
  Filled 2013-10-20: qty 100

## 2013-10-20 MED ORDER — LIDOCAINE HCL (CARDIAC) 20 MG/ML IV SOLN
INTRAVENOUS | Status: DC | PRN
  Administered 2013-10-20: 20 mg via INTRAVENOUS

## 2013-10-20 MED ORDER — RIVAROXABAN 10 MG PO TABS
10.0000 mg | ORAL_TABLET | Freq: Every day | ORAL | Status: DC
  Administered 2013-10-21 – 2013-10-22 (×2): 10 mg via ORAL
  Filled 2013-10-20 (×3): qty 1

## 2013-10-20 MED ORDER — CHLORHEXIDINE GLUCONATE 4 % EX LIQD: 60.0000 mL | Freq: Once | CUTANEOUS | Status: DC

## 2013-10-20 MED ORDER — HYDROCHLOROTHIAZIDE 12.5 MG PO CAPS
12.5000 mg | ORAL_CAPSULE | Freq: Every day | ORAL | Status: DC
Start: 2013-10-20 — End: 2013-10-22
  Administered 2013-10-21 – 2013-10-22 (×2): 12.5 mg via ORAL
  Filled 2013-10-20 (×3): qty 1

## 2013-10-20 MED ORDER — SODIUM CHLORIDE 0.9 % IV SOLN: INTRAVENOUS | Status: DC

## 2013-10-20 MED ORDER — SODIUM CHLORIDE 0.9 % IV SOLN
INTRAVENOUS | Status: DC
  Administered 2013-10-20 (×2): via INTRAVENOUS

## 2013-10-20 MED ORDER — METHOCARBAMOL 500 MG PO TABS
500.0000 mg | ORAL_TABLET | Freq: Four times a day (QID) | ORAL | Status: DC | PRN
  Administered 2013-10-21 – 2013-10-22 (×4): 500 mg via ORAL
  Filled 2013-10-20 (×4): qty 1

## 2013-10-20 MED ORDER — DULOXETINE HCL 60 MG PO CPEP
60.0000 mg | ORAL_CAPSULE | Freq: Every evening | ORAL | Status: DC
  Administered 2013-10-20 – 2013-10-21 (×2): 60 mg via ORAL
  Filled 2013-10-20 (×4): qty 1

## 2013-10-20 MED ORDER — MIDAZOLAM HCL 2 MG/2ML IJ SOLN
INTRAMUSCULAR | Status: AC
  Filled 2013-10-20: qty 2

## 2013-10-20 MED ORDER — METOCLOPRAMIDE HCL 5 MG/ML IJ SOLN: 5.0000 mg | Freq: Three times a day (TID) | INTRAMUSCULAR | Status: DC | PRN

## 2013-10-20 MED ORDER — FENTANYL CITRATE 0.05 MG/ML IJ SOLN
INTRAMUSCULAR | Status: AC
  Filled 2013-10-20: qty 2

## 2013-10-20 MED ORDER — ACETAMINOPHEN 325 MG PO TABS: 650.0000 mg | ORAL_TABLET | Freq: Four times a day (QID) | ORAL | Status: DC | PRN

## 2013-10-20 MED ORDER — DEXAMETHASONE SODIUM PHOSPHATE 10 MG/ML IJ SOLN
10.0000 mg | Freq: Once | INTRAMUSCULAR | Status: AC
  Administered 2013-10-20: 10 mg via INTRAVENOUS

## 2013-10-20 SURGICAL SUPPLY — 60 items
BAG SPEC THK2 15X12 ZIP CLS (MISCELLANEOUS) ×1
BAG ZIPLOCK 12X15 (MISCELLANEOUS) ×2 IMPLANT
BANDAGE ELASTIC 6 VELCRO ST LF (GAUZE/BANDAGES/DRESSINGS) ×2 IMPLANT
BANDAGE ESMARK 6X9 LF (GAUZE/BANDAGES/DRESSINGS) ×1 IMPLANT
BLADE SAG 18X100X1.27 (BLADE) ×2 IMPLANT
BLADE SAW SGTL 11.0X1.19X90.0M (BLADE) ×2 IMPLANT
BNDG CMPR 9X6 STRL LF SNTH (GAUZE/BANDAGES/DRESSINGS) ×1
BNDG ESMARK 6X9 LF (GAUZE/BANDAGES/DRESSINGS) ×2
BOWL SMART MIX CTS (DISPOSABLE) ×2 IMPLANT
CAPT RP KNEE ×1 IMPLANT
CEMENT HV SMART SET (Cement) ×4 IMPLANT
CUFF TOURN SGL QUICK 34 (TOURNIQUET CUFF) ×2
CUFF TRNQT CYL 34X4X40X1 (TOURNIQUET CUFF) ×1 IMPLANT
DECANTER SPIKE VIAL GLASS SM (MISCELLANEOUS) ×2 IMPLANT
DRAPE EXTREMITY TIBURON (DRAPES) ×2 IMPLANT
DRAPE POUCH INSTRU U-SHP 10X18 (DRAPES) ×2 IMPLANT
DRAPE U-SHAPE 47X51 STRL (DRAPES) ×2 IMPLANT
DRSG ADAPTIC 3X8 NADH LF (GAUZE/BANDAGES/DRESSINGS) ×2 IMPLANT
DRSG PAD ABDOMINAL 8X10 ST (GAUZE/BANDAGES/DRESSINGS) ×2 IMPLANT
DURAPREP 26ML APPLICATOR (WOUND CARE) ×2 IMPLANT
ELECT REM PT RETURN 9FT ADLT (ELECTROSURGICAL) ×2
ELECTRODE REM PT RTRN 9FT ADLT (ELECTROSURGICAL) ×1 IMPLANT
EVACUATOR 1/8 PVC DRAIN (DRAIN) ×2 IMPLANT
FACESHIELD WRAPAROUND (MASK) ×10 IMPLANT
FACESHIELD WRAPAROUND OR TEAM (MASK) ×5 IMPLANT
GAUZE SPONGE 4X4 12PLY STRL (GAUZE/BANDAGES/DRESSINGS) ×2 IMPLANT
GLOVE BIO SURGEON STRL SZ7.5 (GLOVE) IMPLANT
GLOVE BIO SURGEON STRL SZ8 (GLOVE) ×2 IMPLANT
GLOVE BIOGEL PI IND STRL 6.5 (GLOVE) IMPLANT
GLOVE BIOGEL PI IND STRL 8 (GLOVE) ×1 IMPLANT
GLOVE BIOGEL PI INDICATOR 6.5 (GLOVE)
GLOVE BIOGEL PI INDICATOR 8 (GLOVE) ×1
GLOVE SURG SS PI 6.5 STRL IVOR (GLOVE) IMPLANT
GOWN STRL REUS W/TWL LRG LVL3 (GOWN DISPOSABLE) ×2 IMPLANT
GOWN STRL REUS W/TWL XL LVL3 (GOWN DISPOSABLE) IMPLANT
HANDPIECE INTERPULSE COAX TIP (DISPOSABLE) ×2
IMMOBILIZER KNEE 20 (SOFTGOODS) ×3 IMPLANT
IMMOBILIZER KNEE 20 THIGH 36 (SOFTGOODS) ×1 IMPLANT
KIT BASIN OR (CUSTOM PROCEDURE TRAY) ×2 IMPLANT
MANIFOLD NEPTUNE II (INSTRUMENTS) ×2 IMPLANT
NDL SAFETY ECLIPSE 18X1.5 (NEEDLE) ×2 IMPLANT
NEEDLE HYPO 18GX1.5 SHARP (NEEDLE) ×4
NS IRRIG 1000ML POUR BTL (IV SOLUTION) ×2 IMPLANT
PACK TOTAL JOINT (CUSTOM PROCEDURE TRAY) ×2 IMPLANT
PADDING CAST COTTON 6X4 STRL (CAST SUPPLIES) ×3 IMPLANT
POSITIONER SURGICAL ARM (MISCELLANEOUS) ×2 IMPLANT
SET HNDPC FAN SPRY TIP SCT (DISPOSABLE) ×1 IMPLANT
STRIP CLOSURE SKIN 1/2X4 (GAUZE/BANDAGES/DRESSINGS) ×3 IMPLANT
SUCTION FRAZIER 12FR DISP (SUCTIONS) ×2 IMPLANT
SUT MNCRL AB 4-0 PS2 18 (SUTURE) ×2 IMPLANT
SUT VIC AB 2-0 CT1 27 (SUTURE) ×6
SUT VIC AB 2-0 CT1 TAPERPNT 27 (SUTURE) ×3 IMPLANT
SUT VLOC 180 0 24IN GS25 (SUTURE) ×2 IMPLANT
SYR 20CC LL (SYRINGE) ×2 IMPLANT
SYR 50ML LL SCALE MARK (SYRINGE) ×2 IMPLANT
TOWEL OR 17X26 10 PK STRL BLUE (TOWEL DISPOSABLE) ×2 IMPLANT
TOWEL OR NON WOVEN STRL DISP B (DISPOSABLE) IMPLANT
TRAY FOLEY CATH 14FRSI W/METER (CATHETERS) ×2 IMPLANT
WATER STERILE IRR 1500ML POUR (IV SOLUTION) ×2 IMPLANT
WRAP KNEE MAXI GEL POST OP (GAUZE/BANDAGES/DRESSINGS) ×2 IMPLANT

## 2013-10-20 NOTE — H&P (View-Only) (Signed)
Early Osmond DOB: 01-26-1941 Married / Language: English / Race: White Female Date of Admission:  10/20/2013 Chief Complaint:  Right Knee Pain History of Present Illness The patient is a 73 year old female who comes in for a preoperative History and Physical. The patient is scheduled for a right total knee arthroplasty to be performed by Dr. Dione Plover. Aluisio, MD at [facility] on 10/20/2013. The right knee she had a cortisone injection a little less than seven months ago. She says that she was doing well until about a month ago. She has had no injuries. She is having pain more medial than lateral. She says she is having minimal discomfort at rest but significant discomfort with weightbearing. No locking or catching of the knee. Minimal trouble with swelling. She also started getting some discomfort over the right greater trochanteric bursa. She says this occurs if she sits for a long period of time and then gets up from that seated position. Also has issues when she is laying on that side at night. She denies groin pain, no low back pain, no numbness or tingling in the legs. She is five months out from a left total knee. She is doing extremely with that. She says occasionally she will be tender along the medial aspect of the knee and have some stiffness but if she is diligent with her exercises that is minimal. She has now reached a point where she would like to get the right knee replaced. She has done well with the left knee and is now ready to proceed with the right side. They have been treated conservatively in the past for the above stated problem and despite conservative measures, they continue to have progressive pain and severe functional limitations and dysfunction. They have failed non-operative management including home exercise, medications, and injections. It is felt that they would benefit from undergoing total joint replacement. Risks and benefits of the procedure have been  discussed with the patient and they elect to proceed with surgery. There are no active contraindications to surgery such as ongoing infection or rapidly progressive neurological disease.  Problem List/Past Medical Status post total left knee replacement (V43.65  [id number]) Primary osteoarthritis of right knee (715.16  M17.11)  Allergies Codeine Derivatives Itching. Mood Changes (Vicodin) Lisinopril *ANTIHYPERTENSIVES* Cough. Ibuprofen *ANALGESICS - ANTI-INFLAMMATORY* blisters  Family History No pertinent family history First Degree Relatives.  Social History Children 2 Drug/Alcohol Rehab (Previously) no Living situation live with spouse Current work status retired Engineer, agricultural (Currently) no Alcohol use current drinker; drinks wine; only occasionally per week Marital status married Exercise Exercises rarely; does running / walking Illicit drug use no Tobacco / smoke exposure no Number of flights of stairs before winded greater than 5 Pain Contract no Tobacco use Former smoker. former smoker; smoke(d) less than 1/2 pack(s) per day Post-Surgical Plans Home  Medication History Metoprolol Tartrate (50MG  Tablet, Oral) Active. Losartan Potassium-HCTZ (100-25MG  Tablet, Oral) Active. Cymbalta (60MG  Capsule DR Part, Oral) Active. Atorvastatin Calcium (20MG  Tablet, Oral) Active.   Past Surgical History  Arthroscopy of Knee bilateral Arthroscopic Knee Surgery - Right Date: 2008. Arthroscopic Knee Surgery - Left Date: 2002. 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 partial (non-cancerous) Colon Polyp Removal - Colonoscopy Foot Surgery right Appendectomy Date: 1960. Spinal Surgery Date: 31. Mammoplasty; Reduction bilateral Tonsillectomy Total Knee Replacement - Left Date: 09/2012.  Past Medical History Measles Mumps Transfusion history 1948 Urinary Tract  Infection Diverticulosis Osteoporosis Hypercholesterolemia Gastroesophageal Reflux Disease DVT  Postop Left Knee Scope - 2002 Osteoarthritis Breast Cancer Bilateral Fibromyalgia COPD Impaired Vision reading glasses Hypertension Menopause Diet-Controlled Diabetes Mellitus Carotid atherosclerosis (433.10  I65.29) Mild Narrowing Seborrheic Keratosis  Review of Systems  General Not Present- Chills, Fatigue, Fever, Memory Loss, Night Sweats, Weight Gain and Weight Loss. Skin Not Present- Eczema, Hives, Itching, Lesions and Rash. HEENT Not Present- Dentures, Double Vision, Headache, Hearing Loss, Tinnitus and Visual Loss. Respiratory Not Present- Allergies, Chronic Cough, Coughing up blood, Shortness of breath at rest and Shortness of breath with exertion. Cardiovascular Not Present- Chest Pain, Difficulty Breathing Lying Down, Murmur, Palpitations, Racing/skipping heartbeats and Swelling. Gastrointestinal Not Present- Abdominal Pain, Bloody Stool, Constipation, Diarrhea, Difficulty Swallowing, Heartburn, Jaundice, Loss of appetitie, Nausea and Vomiting. Female Genitourinary Not Present- Blood in Urine, Discharge, Flank Pain, Incontinence, Painful Urination, Urgency, Urinary frequency, Urinary Retention, Urinating at Night and Weak urinary stream. Musculoskeletal Present- Joint Pain, Joint Swelling, Morning Stiffness and Spasms. Not Present- Back Pain, Muscle Pain and Muscle Weakness. Neurological Not Present- Blackout spells, Difficulty with balance, Dizziness, Paralysis, Tremor and Weakness. Psychiatric Not Present- Insomnia.   Vitals Weight: 140 lb Height: 64in Weight was reported by patient. Height was reported by patient. Body Surface Area: 1.69 m Body Mass Index: 24.03 kg/m BP: 122/58 (Sitting, Left Arm, Standard)    Physical Exam  General Mental Status -Alert, cooperative and good historian. General Appearance-pleasant, Not in acute  distress. Orientation-Oriented X3. Build & Nutrition-Well nourished and Well developed.  Head and Neck Head-normocephalic, atraumatic . Neck Global Assessment - supple, no bruit auscultated on the right, no bruit auscultated on the left.  Eye Pupil - Bilateral-Regular and Round. Motion - Bilateral-EOMI.  Chest and Lung Exam Auscultation Breath sounds - clear at anterior chest wall and clear at posterior chest wall. Adventitious sounds - No Adventitious sounds.  Cardiovascular Auscultation Rhythm - Regular rate and rhythm. Heart Sounds - S1 WNL and S2 WNL. Murmurs & Other Heart Sounds - Auscultation of the heart reveals - No Murmurs.  Abdomen Palpation/Percussion Tenderness - Abdomen is non-tender to palpation. Rigidity (guarding) - Abdomen is soft. Auscultation Auscultation of the abdomen reveals - Bowel sounds normal.  Female Genitourinary Note: Not done, not pertinent to present illness   Musculoskeletal Note: The right knee is tender medially greater than laterally. There is no effusion about the knee. Moderate patellofemoral crepitus. She does have varus deformity. Minimal discomfort with passive range of motion. She can fully extend, flexion back to 120 degrees. She does have normal painless range of motion in the hips however she is tender along the right greater trochanteric bursa. No masses or tumors palpated. The left knee incision is healed. No signs of infection. There is no effusion. No erythema. Range of motion is 0 to 125 without difficulty. No tenderness to palpation. Calves are soft, nontender. Distal pulses are 2+. Sensation and motor function are intact in the lower extremities.  RADIOGRAPHS: AP and lateral views of the knees show the prosthesis in left total knee in excellent position. No periprosthetic abnormalities. The right knee is bone on bone in that medial compartment with osteophyte formation along the medial femoral condyle. Also has  significant narrowing in the patellofemoral compartment.   Assessment & Plan  Primary osteoarthritis of right knee (715.16  M17.11)  Note:Plan is for a Right Total Knee Replacement by Dr. Wynelle Link.  Plan is to go home.  PCP - Dr. Deland Pretty  Topical TXA - History of DVT  Signed electronically by Joelene Millin, III PA-C

## 2013-10-20 NOTE — Evaluation (Signed)
Physical Therapy Evaluation Patient Details Name: Renee [name] MRN: [id number] DOB: 09-04-1940 Today's Date: 10/20/2013   History of Present Illness  s/p R TKA; PMHx: L TKA 2014, DM, DVT after L knee arthroscopy  Clinical Impression  Pt will benefit from PT to address deficits below; Plan is for home with HHPT, she will need RW for home as well.    Follow Up Recommendations Home health PT    Equipment Recommendations  Rolling walker with 5" wheels    Recommendations for Other Services       Precautions / Restrictions Precautions Precautions: Fall;Knee Required Braces or Orthoses: Knee Immobilizer - Right Knee Immobilizer - Right: Discontinue once straight leg raise with < 10 degree lag Restrictions Weight Bearing Restrictions: No Other Position/Activity Restrictions: WBAT      Mobility  Bed Mobility Overal bed mobility: Needs Assistance Bed Mobility: Supine to Sit     Supine to sit: Min guard     General bed mobility comments: cues for technique  Transfers Overall transfer level: Needs assistance Equipment used: Rolling walker (2 wheeled) Transfers: Sit to/from Stand Sit to Stand: Min assist         General transfer comment: verbal cues for hand placement and RLE position  Ambulation/Gait Ambulation/Gait assistance: Min assist;Min guard Ambulation Distance (Feet): 60 Feet Assistive device: Rolling walker (2 wheeled) Gait Pattern/deviations: Step-to pattern;Antalgic     General Gait Details: verbal cues for Rw position and sequence  Stairs            Wheelchair Mobility    Modified Rankin (Stroke Patients Only)       Balance Overall balance assessment: No apparent balance deficits (not formally assessed)                                           Pertinent Vitals/Pain Pain Assessment: 0-10 Pain Score: 3  Pain Location: R TKA Pain Intervention(s): Ice applied;Monitored during session;Limited activity within  patient's tolerance    Home Living Family/patient expects to be discharged to:: Private residence Living Arrangements: Spouse/significant other Available Help at Discharge: Available PRN/intermittently Type of Home: House Home Access: Stairs to enter Entrance Stairs-Rails: None Entrance Stairs-Number of Steps: 3 Home Layout: One level Home Equipment: Crutches;Bedside commode      Prior Function Level of Independence: Independent               Hand Dominance        Extremity/Trunk Assessment   Upper Extremity Assessment: Defer to OT evaluation;Overall WFL for tasks assessed           Lower Extremity Assessment: RLE deficits/detail RLE Deficits / Details: grossly 2+/5 to 3/5 knee and hip; ankle [facility]       Communication      Cognition Arousal/Alertness: Awake/alert Behavior During Therapy: WFL for tasks assessed/performed Overall Cognitive Status: Within Functional Limits for tasks assessed                      General Comments      Exercises Total Joint Exercises Ankle Circles/Pumps: AROM;Both;10 reps Quad Sets: AROM;Strengthening;5 reps      Assessment/Plan    PT Assessment Patient needs continued PT services  PT Diagnosis Difficulty walking   PT Problem List Decreased strength;Decreased range of motion;Decreased activity tolerance;Decreased knowledge of precautions;Decreased knowledge of use of DME  PT Treatment Interventions DME instruction;Gait training;Functional mobility  training;Therapeutic activities;Stair training;Patient/family education   PT Goals (Current goals can be found in the Care Plan section) Acute Rehab PT Goals Patient Stated Goal: return to I lifestyle PT Goal Formulation: With patient Time For Goal Achievement: 10/23/13 Potential to Achieve Goals: Good    Frequency 7X/week   Barriers to discharge        Co-evaluation               End of Session Equipment Utilized During Treatment: Gait belt Activity  Tolerance: Patient tolerated treatment well Patient left: in chair;with call bell/phone within reach           Time: 1717-1734 PT Time Calculation (min): 17 min   Charges:   PT Evaluation $Initial PT Evaluation Tier I: 1 Procedure PT Treatments $Gait Training: 8-22 mins   PT G Codes:          Delores Edelstein 10/31/13, 10:19 PM

## 2013-10-20 NOTE — Plan of Care (Signed)
Problem: Consults Goal: Diagnosis- Total Joint Replacement Right total knee     

## 2013-10-20 NOTE — Anesthesia Procedure Notes (Signed)
Spinal  Patient location during procedure: OR Start time: 10/20/2013 10:13 AM End time: 10/20/2013 10:20 AM Staffing Anesthesiologist: MANNY, THEODORE CRNA/Resident: EARGLE, BETH E Performed by: anesthesiologist  Preanesthetic Checklist Completed: patient identified, site marked, surgical consent, pre-op evaluation, timeout performed, IV checked, risks and benefits discussed and monitors and equipment checked Spinal Block Patient position: sitting Prep: Betadine Patient monitoring: heart rate, continuous pulse ox and blood pressure Approach: midline Location: L2-3 Injection technique: single-shot Needle Needle type: Sprotte  Needle gauge: 24 G Needle length: 10 cm Additional Notes Kit expiration checked, time out performed, sitting position. Attempt L3-4 and L2-3 by CRNA w/o success. Dr. Manny at L2-3, clear CSF, neg heme, neg paresthesia. Pt. tol well, return to supine.   

## 2013-10-20 NOTE — Op Note (Signed)
Pre-operative diagnosis- Osteoarthritis  Right knee(s)  Post-operative diagnosis- Osteoarthritis Right knee(s)  Procedure-  Right  Total Knee Arthroplasty  Surgeon- Dione Plover. Jerilyn Gillaspie, MD  Assistant- Arlee Muslim, PA-C   Anesthesia-  Spinal  EBL-* No blood loss amount entered *   Drains Hemovac  Tourniquet time-  Total Tourniquet Time Documented: Thigh (Right) - 32 minutes Total: Thigh (Right) - 32 minutes     Complications- None  Condition-PACU - hemodynamically stable.   Brief Clinical Note  Renee [name] is a 73 y.o. year old female with end stage OA of her right knee with progressively worsening pain and dysfunction. She has constant pain, with activity and at rest and significant functional deficits with difficulties even with ADLs. She has had extensive non-op management including analgesics, injections of cortisone and viscosupplements, and home exercise program, but remains in significant pain with significant dysfunction.Radiographs show bone on bone arthritis medial and patellofemoral. She presents now for right Total Knee Arthroplasty.     Procedure in detail---   The patient is brought into the operating room and positioned supine on the operating table. After successful administration of  Spinal,   a tourniquet is placed high on the  Right thigh(s) and the lower extremity is prepped and draped in the usual sterile fashion. Time out is performed by the operating team and then the  Right lower extremity is wrapped in Esmarch, knee flexed and the tourniquet inflated to 300 mmHg.       A midline incision is made with a ten blade through the subcutaneous tissue to the level of the extensor mechanism. A fresh blade is used to make a medial parapatellar arthrotomy. Soft tissue over the proximal medial tibia is subperiosteally elevated to the joint line with a knife and into the semimembranosus bursa with a Cobb elevator. Soft tissue over the proximal lateral tibia is elevated  with attention being paid to avoiding the patellar tendon on the tibial tubercle. The patella is everted, knee flexed 90 degrees and the ACL and PCL are removed. Findings are bone on boen medial and patellofemoral with large emdial osteophytes.        The drill is used to create a starting hole in the distal femur and the canal is thoroughly irrigated with sterile saline to remove the fatty contents. The 5 degree Right  valgus alignment guide is placed into the femoral canal and the distal femoral cutting block is pinned to remove 10 mm off the distal femur. Resection is made with an oscillating saw.      The tibia is subluxed forward and the menisci are removed. The extramedullary alignment guide is placed referencing proximally at the medial aspect of the tibial tubercle and distally along the second metatarsal axis and tibial crest. The block is pinned to remove 63mm off the more deficient medial  side. Resection is made with an oscillating saw. Size 2.5is the most appropriate size for the tibia and the proximal tibia is prepared with the modular drill and keel punch for that size.      The femoral sizing guide is placed and size 3 is most appropriate. Rotation is marked off the epicondylar axis and confirmed by creating a rectangular flexion gap at 90 degrees. The size 3 cutting block is pinned in this rotation and the anterior, posterior and chamfer cuts are made with the oscillating saw. The intercondylar block is then placed and that cut is made.      Trial size 2.5 tibial component,  trial size 3 posterior stabilized femur and a 10  mm posterior stabilized rotating platform insert trial is placed. Full extension is achieved with excellent varus/valgus and anterior/posterior balance throughout full range of motion. The patella is everted and thickness measured to be 22  mm. Free hand resection is taken to 12 mm, a 38 template is placed, lug holes are drilled, trial patella is placed, and it tracks normally.  Osteophytes are removed off the posterior femur with the trial in place. All trials are removed and the cut bone surfaces prepared with pulsatile lavage. Cement is mixed and once ready for implantation, the size 2.5 tibial implant, size  3 posterior stabilized femoral component, and the size 38 patella are cemented in place and the patella is held with the clamp. The trial insert is placed and the knee held in full extension. The Exparel (20 ml mixed with 30 ml saline) and .25% Bupivicaine, are injected into the extensor mechanism, posterior capsule, medial and lateral gutters and subcutaneous tissues.  All extruded cement is removed and once the cement is hard the permanent 10 mm posterior stabilized rotating platform insert is placed into the tibial tray.      The wound is copiously irrigated with saline solution and the extensor mechanism closed over a hemovac drain with #1 V-loc suture. 2 grams of tranexamic acid mixed with 50 ml saline are then injected into the joint. The tourniquet is released for a total tourniquet time of 32  minutes. Flexion against gravity is 140 degrees and the patella tracks normally. Subcutaneous tissue is closed with 2.0 vicryl and subcuticular with running 4.0 Monocryl. The incision is cleaned and dried and steri-strips and a bulky sterile dressing are applied. The limb is placed into a knee immobilizer and the patient is awakened and transported to recovery in stable condition.      Please note that a surgical assistant was a medical necessity for this procedure in order to perform it in a safe and expeditious manner. Surgical assistant was necessary to retract the ligaments and vital neurovascular structures to prevent injury to them and also necessary for proper positioning of the limb to allow for anatomic placement of the prosthesis.   Dione Plover Tashea Othman, MD    10/20/2013, 11:13 AM

## 2013-10-20 NOTE — Anesthesia Preprocedure Evaluation (Addendum)
Anesthesia Evaluation  Patient identified by MRN, date of birth, ID band Patient awake    Reviewed: Allergy & Precautions, H&P , NPO status , Patient's Chart, lab work & pertinent test results  Airway Mallampati: II TM Distance: >3 FB     Dental  (+) Dental Advisory Given, Caps   Pulmonary former smoker,          Cardiovascular hypertension, Pt. on medications Rhythm:Regular Rate:Normal  EF 65% 2014   Neuro/Psych    GI/Hepatic   Endo/Other    Renal/GU      Musculoskeletal   Abdominal (+)  Abdomen: soft.    Peds  Hematology   Anesthesia Other Findings   Reproductive/Obstetrics                          Anesthesia Physical Anesthesia Plan  ASA: III  Anesthesia Plan: Spinal   Post-op Pain Management:    Induction:   Airway Management Planned: Nasal Cannula  Additional Equipment:   Intra-op Plan:   Post-operative Plan:   Informed Consent: I have reviewed the patients History and Physical, chart, labs and discussed the procedure including the risks, benefits and alternatives for the proposed anesthesia with the patient or authorized representative who has indicated his/her understanding and acceptance.     Plan Discussed with:   Anesthesia Plan Comments:         Anesthesia Quick Evaluation

## 2013-10-20 NOTE — Anesthesia Postprocedure Evaluation (Signed)
  Anesthesia Post-op Note  Patient: Renee [name]  Procedure(s) Performed: Procedure(s): RIGHT TOTAL KNEE ARTHROPLASTY (Right)  Patient Location: PACU  Anesthesia Type:General  Level of Consciousness: awake and alert   Airway and Oxygen Therapy: Patient Spontanous Breathing and Patient connected to nasal cannula oxygen  Post-op Pain: mild  Post-op Assessment: Post-op Vital signs reviewed, Patient's Cardiovascular Status Stable and Respiratory Function Stable  Post-op Vital Signs: Reviewed and stable  Last Vitals:  Filed Vitals:   10/20/13 1355  BP: 120/61  Pulse: 77  Temp: 36.6 C  Resp: 16    Complications: No apparent anesthesia complications

## 2013-10-20 NOTE — Progress Notes (Signed)
Utilization review completed.  

## 2013-10-20 NOTE — Interval H&P Note (Signed)
History and Physical Interval Note:  10/20/2013 8:20 AM  Renee [name]  has presented today for surgery, with the diagnosis of OA RIGHT KNEE  The various methods of treatment have been discussed with the patient and family. After consideration of risks, benefits and other options for treatment, the patient has consented to  Procedure(s): RIGHT TOTAL KNEE ARTHROPLASTY (Right) as a surgical intervention .  The patient's history has been reviewed, patient examined, no change in status, stable for surgery.  I have reviewed the patient's chart and labs.  Questions were answered to the patient's satisfaction.     Gearlean Alf

## 2013-10-20 NOTE — Transfer of Care (Signed)
Immediate Anesthesia Transfer of Care Note  Patient: Renee [name]  Procedure(s) Performed: Procedure(s) (LRB): RIGHT TOTAL KNEE ARTHROPLASTY (Right)  Patient Location: PACU  Anesthesia Type: Spinal  Level of Consciousness: sedated, patient cooperative and responds to stimulation  Airway & Oxygen Therapy: Patient Spontanous Breathing and Patient connected to face mask oxgen  Post-op Assessment: Report given to PACU RN and Post -op Vital signs reviewed and stable  Post vital signs: Reviewed and stable  Complications: No apparent anesthesia complications

## 2013-10-21 LAB — BASIC METABOLIC PANEL
Anion gap: 8 (ref 5–15)
BUN: 10 mg/dL (ref 6–23)
CO2: 27 mEq/L (ref 19–32)
Calcium: 8.7 mg/dL (ref 8.4–10.5)
Chloride: 106 mEq/L (ref 96–112)
Creatinine, Ser: 0.58 mg/dL (ref 0.50–1.10)
GFR calc Af Amer: >90 mL/min (ref >90)
GFR calc non Af Amer: 89 mL/min — ABNORMAL LOW (ref >90)
Glucose, Bld: 150 mg/dL — ABNORMAL HIGH (ref 70–99)
Potassium: 4.4 mEq/L (ref 3.7–5.3)
Sodium: 141 mEq/L (ref 137–147)

## 2013-10-21 LAB — CBC
HCT: 30.9 % — ABNORMAL LOW (ref 36.0–46.0)
Hemoglobin: 10.7 g/dL — ABNORMAL LOW (ref 12.0–15.0)
MCH: 32.4 pg (ref 26.0–34.0)
MCHC: 34.6 g/dL (ref 30.0–36.0)
MCV: 93.6 fL (ref 78.0–100.0)
Platelets: 220 10*3/uL (ref 150–400)
RBC: 3.3 MIL/uL — ABNORMAL LOW (ref 3.87–5.11)
RDW: 12.1 % (ref 11.5–15.5)
WBC: 11.8 10*3/uL — ABNORMAL HIGH (ref 4.0–10.5)

## 2013-10-21 NOTE — Evaluation (Signed)
Occupational Therapy Evaluation and Discharge Summary Patient Details Name: Renee [name] MRN: [id number] DOB: 1940/09/27 Today's Date: 10/21/2013    History of Present Illness s/p R TKA; PMHx: L TKA 2014, DM, DVT after L knee arthroscopy   Clinical Impression   Pt admitted for the above diagnosis an overall is doing very well with adls.  Safety precautions reviewed and pt has all necessary equipment.    Follow Up Recommendations  No OT follow up;Supervision - Intermittent    Equipment Recommendations  None recommended by OT    Recommendations for Other Services       Precautions / Restrictions Precautions Precautions: Fall;Knee Precaution Comments: Talked to pt at length about not getting up by herself with IV and walker. Required Braces or Orthoses:  (pt able to do straight leg raise.) Knee Immobilizer - Right: Discontinue once straight leg raise with < 10 degree lag Restrictions Weight Bearing Restrictions: No Other Position/Activity Restrictions: WBAT      Mobility Bed Mobility                  Transfers Overall transfer level: Needs assistance Equipment used: Rolling walker (2 wheeled) Transfers: Sit to/from [employer] Transfers Sit to Stand: Modified independent (Device/Increase time) Stand pivot transfers: Modified independent (Device/Increase time)       General transfer comment: occasional cues in beginning of session for hand placement but better by end of session.    Balance Overall balance assessment: No apparent balance deficits (not formally assessed)                                          ADL Overall ADL's : Modified independent                                       General ADL Comments: Pt did very well with adls.  Pt able to donn and doff socks/pants/bathe and groom all with distant S.  Pt only needed S due to IV in arm and use of walker.  Pt was found in room up by herself with IV wrapped  around bed sheets and ice packs still strapped to her leg.  Talked to pt at length about calling for help in the early days due to IV and fall risks.     Vision                     Perception     Praxis      Pertinent Vitals/Pain Pain Assessment: 0-10 Pain Score: 5  Pain Location: R knee Pain Descriptors / Indicators: Aching Pain Intervention(s): Patient requesting pain meds-RN notified;Monitored during session;Ice applied     Hand Dominance Right   Extremity/Trunk Assessment Upper Extremity Assessment Upper Extremity Assessment: Overall WFL for tasks assessed   Lower Extremity Assessment Lower Extremity Assessment: Defer to PT evaluation   Cervical / Trunk Assessment Cervical / Trunk Assessment: Normal   Communication Communication Communication: No difficulties   Cognition Arousal/Alertness: Awake/alert Behavior During Therapy: WFL for tasks assessed/performed Overall Cognitive Status: Within Functional Limits for tasks assessed                     General Comments       Exercises       Shoulder Instructions  Home Living Family/patient expects to be discharged to:: Private residence Living Arrangements: Spouse/significant other Available Help at Discharge: Available PRN/intermittently Type of Home: House Home Access: Stairs to enter [employer] of Steps: 3 Entrance Stairs-Rails: None Home Layout: One level     Bathroom Shower/Tub: Tub/shower unit;Curtain Shower/tub characteristics: Architectural technologist: Standard Bathroom Accessibility: Yes How Accessible: Accessible via walker Home Equipment: Bedside commode;Crutches;Shower seat   Additional Comments: has a tub and walk in shower      Prior Functioning/Environment Level of Independence: Independent             OT Diagnosis:     OT Problem List:     OT Treatment/Interventions:      OT Goals(Current goals can be found in the care plan section) Acute  Rehab OT Goals Patient Stated Goal: return to I lifestyle  OT Frequency:     Barriers to D/C:            Co-evaluation              End of Session Equipment Utilized During Treatment: Rolling walker CPM Right Knee CPM Right Knee: Off Nurse Communication: Mobility status;Patient requests pain meds  Activity Tolerance: Patient tolerated treatment well Patient left: in chair;with call bell/phone within reach   Time: 0932-0945 OT Time Calculation (min): 13 min Charges:  OT General Charges $OT Visit: 1 Procedure OT Evaluation $Initial OT Evaluation Tier I: 1 Procedure OT Treatments $Self Care/Home Management : 8-22 mins G-Codes:    Glenford Peers, OTR/L 10/21/2013, 9:51 AM [phone]

## 2013-10-21 NOTE — Progress Notes (Signed)
Physical Therapy Treatment Patient Details Name: Renee [name] MRN: [id number] DOB: August 29, 1940 Today's Date: 10/21/2013    History of Present Illness s/p R TKA; PMHx: L TKA 2014, DM, DVT after L knee arthroscopy    PT Comments    Pt tolerated  Ambulation without KI.    Follow Up Recommendations  Home health PT     Equipment Recommendations  Rolling walker with 5" wheels    Recommendations for Other Services       Precautions / Restrictions Precautions Precautions: Fall;Knee    Mobility  Bed Mobility   Bed Mobility: Supine to Sit;Sit to Supine     Supine to sit: Supervision Sit to supine: Supervision   General bed mobility comments: cues for technique  Transfers   Equipment used: Rolling walker (2 wheeled) Transfers: Sit to/from Stand Sit to Stand: Supervision         General transfer comment: cues for hand hand placement and overall safety    Ambulation/Gait Ambulation/Gait assistance: Supervision Ambulation Distance (Feet): 20 Feet (x2) Assistive device: Rolling walker (2 wheeled) Gait Pattern/deviations: Step-to pattern;Step-through pattern     General Gait Details: verbal cues for RW position and sequence   Stairs            Wheelchair Mobility    Modified Rankin (Stroke Patients Only)       Balance                                    Cognition Arousal/Alertness: Awake/alert                          Exercises      General Comments        Pertinent Vitals/Pain      Home Living                      Prior Function            PT Goals (current goals can now be found in the care plan section) Progress towards PT goals: Progressing toward goals    Frequency  7X/week    PT Plan Current plan remains appropriate    Co-evaluation             End of Session   Activity Tolerance: Patient tolerated treatment well Patient left: in bed;with call bell/phone within reach      Time: 2025-4270 PT Time Calculation (min): 24 min  Charges:  $Gait Training: 8-22 mins $Self Care/Home Management: 8-22                    G Codes:      Renee [name] 10/21/2013, 4:59 PM

## 2013-10-21 NOTE — Progress Notes (Signed)
   Subjective: 1 Day Post-Op Procedure(s) (LRB): RIGHT TOTAL KNEE ARTHROPLASTY (Right) Patient reports pain as mild.  Denies any CP/SOB/Nausea Patient seen in rounds for Dr. Wynelle Link.  She walked 60 feet yesterday following surgery. Patient is well, and has had no acute complaints or problems and doing fairly well this morning. We will resume therapy today.  Plan is to go Home after hospital stay.  Objective: Vital signs in last 24 hours: Temp:  [97.3 F (36.3 C)-98.4 F (36.9 C)] 98.3 F (36.8 C) (09/22 0541) Pulse Rate:  [65-88] 80 (09/22 0541) Resp:  [10-18] 16 (09/22 0541) BP: (102-122)/(39-62) 110/58 mmHg (09/22 0541) SpO2:  [92 %-100 %] 98 % (09/22 0541) Weight:  [65.772 kg (145 lb)] 65.772 kg (145 lb) (09/21 1255)  Intake/Output from previous day:  Intake/Output Summary (Last 24 hours) at 10/21/13 0915 Last data filed at 10/21/13 0700  Gross per 24 hour  Intake   4845 ml  Output   3255 ml  Net   1590 ml    Intake/Output this shift: UOP 1525 since MN  Labs:  Recent Labs  10/21/13 0434  HGB 10.7*    Recent Labs  10/21/13 0434  WBC 11.8*  RBC 3.30*  HCT 30.9*  PLT 220    Recent Labs  10/21/13 0434  NA 141  K 4.4  CL 106  CO2 27  BUN 10  CREATININE 0.58  GLUCOSE 150*  CALCIUM 8.7   No results found for this basename: LABPT, INR,  in the last 72 hours  EXAM General - Patient is Alert, Appropriate and Oriented Extremity - Neurovascular intact Sensation intact distally Dorsiflexion/Plantar flexion intact Dressing - dressing C/D/I Motor Function - intact, moving foot and toes well on exam.  Hemovac pulled without difficulty.  Past Medical History  Diagnosis Date  . Blood transfusion 1948  . Cataract     both eyes  . Hyperlipidemia   . Hypertension   . Arthritis     oa  . DVT (deep venous thrombosis) 2002    left leg  . Diverticulosis   . Measles as child  . Mumps age 55  . COPD (chronic obstructive pulmonary disease)   . Disc  disease, degenerative, cervical     trouble turing head and neck at times  . PONV (postoperative nausea and vomiting)   . Fibromyalgia   . Hx of skin cancer, basal cell   . Breast cancer 1994    bilateral / HX skin cancer  . Sinus tachycardia     Assessment/Plan: 1 Day Post-Op Procedure(s) (LRB): RIGHT TOTAL KNEE ARTHROPLASTY (Right) Active Problems:   OA (osteoarthritis) of knee  Estimated body mass index is 24.88 kg/(m^2) as calculated from the following:   Height as of this encounter: 5\' 4"  (1.626 m).   Weight as of this encounter: 65.772 kg (145 lb). Advance diet Up with therapy Plan for discharge tomorrow Discharge home with home health  DVT Prophylaxis - Xarelto Weight-Bearing as tolerated to right leg D/C O2 and Pulse OX and try on Room Air  Arlee Muslim, PA-C Orthopaedic Surgery 10/21/2013, 9:15 AM

## 2013-10-21 NOTE — Progress Notes (Signed)
Agree with previous RN assessment, will continue to monitor pt

## 2013-10-21 NOTE — Progress Notes (Signed)
[facility]  [facility] is providing the following services: Received orders for rw and commode - patient received both items 10/16/12.  Her insurance will only pay for both once every five years.    If patient discharges after hours, please call [phone].   Linward Headland 10/21/2013, 11:37 AM

## 2013-10-21 NOTE — Progress Notes (Signed)
Physical Therapy Treatment Patient Details Name: Renee [name] MRN: [id number] DOB: 02-05-1940 Today's Date: 10/21/2013    History of Present Illness s/p R TKA; PMHx: L TKA 2014, DM, DVT after L knee arthroscopy    PT Comments    Pt progressing well;  Excellent knee flexion today  Follow Up Recommendations  Home health PT     Equipment Recommendations  Rolling walker with 5" wheels    Recommendations for Other Services       Precautions / Restrictions Precautions Precautions: Fall;Knee Precaution Comments: Talked to pt at length about not getting up by herself with IV and walker. Required Braces or Orthoses: Knee Immobilizer - Right Knee Immobilizer - Right: Discontinue once straight leg raise with < 10 degree lag Restrictions Weight Bearing Restrictions: No Other Position/Activity Restrictions: WBAT    Mobility  Bed Mobility Overal bed mobility: Needs Assistance Bed Mobility: Sit to Supine       Sit to supine: Min guard   General bed mobility comments: cues for technique  Transfers Overall transfer level: Needs assistance Equipment used: Rolling walker (2 wheeled) Transfers: Sit to/from Stand Sit to Stand: Supervision Stand pivot transfers: Modified independent (Device/Increase time)       General transfer comment: cues for hand hand placement and overall safety  with sit to stand and stand to sit transitions, pt stands with out walker intially  Ambulation/Gait Ambulation/Gait assistance: Min guard Ambulation Distance (Feet): 130 Feet Assistive device: Rolling walker (2 wheeled) Gait Pattern/deviations: Step-to pattern;Antalgic;Decreased step length - right;Decreased step length - left     General Gait Details: verbal cues for RW position and sequence   Stairs            Wheelchair Mobility    Modified Rankin (Stroke Patients Only)       Balance Overall balance assessment: No apparent balance deficits (not formally assessed)                                   Cognition Arousal/Alertness: Awake/alert Behavior During Therapy: WFL for tasks assessed/performed Overall Cognitive Status: Within Functional Limits for tasks assessed                      Exercises Total Joint Exercises Ankle Circles/Pumps: AROM;Both;10 reps Quad Sets: AROM;Strengthening;Both;10 reps Heel Slides: AROM;Strengthening;Right;10 reps Hip ABduction/ADduction: AROM;10 reps;Right;Strengthening Straight Leg Raises: AROM;Strengthening;10 reps;Right Knee Flexion: AROM;AAROM;Right;5 reps Goniometric ROM: -5 to 110*    General Comments        Pertinent Vitals/Pain Pain Assessment: 0-10 Pain Score: 3  Pain Location: R knee Pain Descriptors / Indicators: Aching Pain Intervention(s): Limited activity within patient's tolerance;Monitored during session;Premedicated before session;Ice applied    Home Living Family/patient expects to be discharged to:: Private residence Living Arrangements: Spouse/significant other Available Help at Discharge: Available PRN/intermittently Type of Home: House Home Access: Stairs to enter Entrance Stairs-Rails: None Home Layout: One level Home Equipment: Bedside commode;Crutches;Shower seat Additional Comments: has a tub and walk in shower    Prior Function Level of Independence: Independent          PT Goals (current goals can now be found in the care plan section) Acute Rehab PT Goals Patient Stated Goal: return to I lifestyle PT Goal Formulation: With patient Time For Goal Achievement: 10/23/13 Potential to Achieve Goals: Good Progress towards PT goals: Progressing toward goals    Frequency  7X/week    PT Plan Current  plan remains appropriate    Co-evaluation             End of Session Equipment Utilized During Treatment: Gait belt Activity Tolerance: Patient tolerated treatment well Patient left: in bed;with call bell/phone within reach     Time: 1102-1126 PT  Time Calculation (min): 24 min  Charges:  $Gait Training: 8-22 mins $Therapeutic Exercise: 8-22 mins                    G Codes:      Renee [name] 11/05/13, 12:00 PM

## 2013-10-21 NOTE — Discharge Instructions (Addendum)
Dr. Gaynelle Arabian Total Joint Specialist [facility] [address] [place], [place] [address] [phone]  TOTAL KNEE REPLACEMENT POSTOPERATIVE DIRECTIONS    Knee Rehabilitation, Guidelines Following Surgery  Results after knee surgery are often greatly improved when you follow the exercise, range of motion and muscle strengthening exercises prescribed by your doctor. Safety measures are also important to protect the knee from further injury. Any time any of these exercises cause you to have increased pain or swelling in your knee joint, decrease the amount until you are comfortable again and slowly increase them. If you have problems or questions, call your caregiver or physical therapist for advice.   HOME CARE INSTRUCTIONS  Remove items at home which could result in a fall. This includes throw rugs or furniture in walking pathways.  Continue medications as instructed at time of discharge. You may have some home medications which will be placed on hold until you complete the course of blood thinner medication.  You may start showering once you are discharged home but do not submerge the incision under water. Just pat the incision dry and apply a dry gauze dressing on daily. Walk with walker as instructed.  You may resume a sexual relationship in one month or when given the OK by  your doctor.   Use walker as long as suggested by your caregivers.  Avoid periods of inactivity such as sitting longer than an hour when not asleep. This helps prevent blood clots.  You may put full weight on your legs and walk as much as is comfortable.  You may return to work once you are cleared by your doctor.  Do not drive a car for 6 weeks or until released by you surgeon.   Do not drive while taking narcotics.  Wear the elastic stockings for three weeks following surgery during the day but you may remove then at night. Make sure you keep all of your appointments after your  operation with all of your doctors and caregivers. You should call the office at the above phone number and make an appointment for approximately two weeks after the date of your surgery. Change the dressing daily and reapply a dry dressing each time. Please pick up a stool softener and laxative for home use as long as you are requiring pain medications.  Continue to use ice on the knee for pain and swelling from surgery. You may notice swelling that will progress down to the foot and ankle.  This is normal after surgery.  Elevate the leg when you are not up walking on it.   It is important for you to complete the blood thinner medication as prescribed by your doctor.  Continue to use the breathing machine which will help keep your temperature down.  It is common for your temperature to cycle up and down following surgery, especially at night when you are not up moving around and exerting yourself.  The breathing machine keeps your lungs expanded and your temperature down.  RANGE OF MOTION AND STRENGTHENING EXERCISES  Rehabilitation of the knee is important following a knee injury or an operation. After just a few days of immobilization, the muscles of the thigh which control the knee become weakened and shrink (atrophy). Knee exercises are designed to build up the tone and strength of the thigh muscles and to improve knee motion. Often times heat used for twenty to thirty minutes before working out will loosen up your tissues and help with improving the  range of motion but do not use heat for the first two weeks following surgery. These exercises can be done on a training (exercise) mat, on the floor, on a table or on a bed. Use what ever works the best and is most comfortable for you Knee exercises include:  Leg Lifts - While your knee is still immobilized in a splint or cast, you can do straight leg raises. Lift the leg to 60 degrees, hold for 3 sec, and slowly lower the leg. Repeat 10-20 times 2-3  times daily. Perform this exercise against resistance later as your knee gets better.  Quad and Hamstring Sets - Tighten up the muscle on the front of the thigh (Quad) and hold for 5-10 sec. Repeat this 10-20 times hourly. Hamstring sets are done by pushing the foot backward against an object and holding for 5-10 sec. Repeat as with quad sets.  A rehabilitation program following serious knee injuries can speed recovery and prevent re-injury in the future due to weakened muscles. Contact your doctor or a physical therapist for more information on knee rehabilitation.   SKILLED REHAB INSTRUCTIONS: If the patient is transferred to a skilled rehab facility following release from the hospital, a list of the current medications will be sent to the facility for the patient to continue.  When discharged from the skilled rehab facility, please have the facility set up the patient's [place] prior to being released. Also, the skilled facility will be responsible for providing the patient with their medications at time of release from the facility to include their pain medication, the muscle relaxants, and their blood thinner medication. If the patient is still at the rehab facility at time of the two week follow up appointment, the skilled rehab facility will also need to assist the patient in arranging follow up appointment in our office and any transportation needs.  MAKE SURE YOU:  Understand these instructions.  Will watch your condition.  Will get help right away if you are not doing well or get worse.    Pick up stool softner and laxative for home. Do not submerge incision under water. May shower. Continue to use ice for pain and swelling from surgery.  Take Xarelto for two and a half more weeks, then discontinue Xarelto. Once the patient has completed the Xarelto, they may resume the 81 mg Aspirin.   Information on my medicine - XARELTO (Rivaroxaban)  This medication  education was reviewed with me or my healthcare representative as part of my discharge preparation.  The pharmacist that spoke with me during my hospital stay was:  Altha Harm, PharmD  Why was Xarelto prescribed for you? Xarelto was prescribed for you to reduce the risk of blood clots forming after orthopedic surgery. The medical term for these abnormal blood clots is venous thromboembolism (VTE).  What do you need to know about xarelto ? Take your Xarelto ONCE DAILY at the same time every day. You may take it either with or without food.  If you have difficulty swallowing the tablet whole, you may crush it and mix in applesauce just prior to taking your dose.  Take Xarelto exactly as prescribed by your doctor and DO NOT stop taking Xarelto without talking to the doctor who prescribed the medication.  Stopping without other VTE prevention medication to take the place of Xarelto may increase your risk of developing a clot.  After discharge, you should have regular check-up appointments with your healthcare provider that is prescribing your  Xarelto.    What do you do if you miss a dose? If you miss a dose, take it as soon as you remember on the same day then continue your regularly scheduled once daily regimen the next day. Do not take two doses of Xarelto on the same day.   Important Safety Information A possible side effect of Xarelto is bleeding. You should call your healthcare provider right away if you experience any of the following:   Bleeding from an injury or your nose that does not stop.   Unusual colored urine (red or dark brown) or unusual colored stools (red or black).   Unusual bruising for unknown reasons.   A serious fall or if you hit your head (even if there is no bleeding).  Some medicines may interact with Xarelto and might increase your risk of bleeding while on Xarelto. To help avoid this, consult your healthcare provider or pharmacist prior to using any new  prescription or non-prescription medications, including herbals, vitamins, non-steroidal anti-inflammatory drugs (NSAIDs) and supplements.  This website has more information on Xarelto: [email or web address]

## 2013-10-22 LAB — BASIC METABOLIC PANEL
Anion gap: 10 (ref 5–15)
BUN: 10 mg/dL (ref 6–23)
CO2: 29 mEq/L (ref 19–32)
Calcium: 9.2 mg/dL (ref 8.4–10.5)
Chloride: 99 mEq/L (ref 96–112)
Creatinine, Ser: 0.58 mg/dL (ref 0.50–1.10)
GFR calc Af Amer: >90 mL/min (ref >90)
GFR calc non Af Amer: 89 mL/min — ABNORMAL LOW (ref >90)
Glucose, Bld: 144 mg/dL — ABNORMAL HIGH (ref 70–99)
Potassium: 5.1 mEq/L (ref 3.7–5.3)
Sodium: 138 mEq/L (ref 137–147)

## 2013-10-22 LAB — CBC
HCT: 30.8 % — ABNORMAL LOW (ref 36.0–46.0)
Hemoglobin: 10.5 g/dL — ABNORMAL LOW (ref 12.0–15.0)
MCH: 32.3 pg (ref 26.0–34.0)
MCHC: 34.1 g/dL (ref 30.0–36.0)
MCV: 94.8 fL (ref 78.0–100.0)
Platelets: 229 10*3/uL (ref 150–400)
RBC: 3.25 MIL/uL — ABNORMAL LOW (ref 3.87–5.11)
RDW: 12.2 % (ref 11.5–15.5)
WBC: 14.5 10*3/uL — ABNORMAL HIGH (ref 4.0–10.5)

## 2013-10-22 MED ORDER — TRAMADOL HCL 50 MG PO TABS: 50.0000 mg | ORAL_TABLET | Freq: Four times a day (QID) | ORAL | Status: DC | PRN

## 2013-10-22 MED ORDER — RIVAROXABAN 10 MG PO TABS: 10.0000 mg | ORAL_TABLET | Freq: Every day | ORAL | Status: DC

## 2013-10-22 MED ORDER — HYDROMORPHONE HCL 2 MG PO TABS: 2.0000 mg | ORAL_TABLET | ORAL | Status: DC | PRN

## 2013-10-22 MED ORDER — METHOCARBAMOL 500 MG PO TABS: 500.0000 mg | ORAL_TABLET | Freq: Four times a day (QID) | ORAL | Status: DC | PRN

## 2013-10-22 NOTE — Progress Notes (Signed)
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 She walked 250' with RW, completed stair training and is doing well with knee ROM (-5-85*). During knee flexion exercises she became nauseous, vital signs were stable, and nausea decreased with rest, cold cloth to her head, and sips of ginger ale. From PT standpoint she is ready to DC home. **  Follow Up Recommendations  Home health PT     Equipment Recommendations  Rolling walker with 5" wheels    Recommendations for Other Services       Precautions / Restrictions Precautions Precautions: Fall;Knee Restrictions Weight Bearing Restrictions: No Other Position/Activity Restrictions: WBAT    Mobility  Bed Mobility Overal bed mobility: Modified Independent Bed Mobility: Supine to Sit     Supine to sit: Modified independent (Device/Increase time);HOB elevated     General bed mobility comments: HOB 20*  Transfers Overall transfer level: Needs assistance Equipment used: Rolling walker (2 wheeled) Transfers: Sit to/from Stand Sit to Stand: Supervision         General transfer comment: cues for hand hand placement and overall safety    Ambulation/Gait Ambulation/Gait assistance: Modified independent (Device/Increase time) Ambulation Distance (Feet): 250 Feet Assistive device: Rolling walker (2 wheeled) Gait Pattern/deviations: Step-through pattern     General Gait Details: good sequencing and positioning   Stairs Stairs: Yes Stairs assistance: Min assist Stair Management: No rails;Backwards;Step to pattern Number of Stairs: 3 General stair comments: min A for management of RW, cues for sequencing  Wheelchair Mobility    Modified Rankin (Stroke Patients Only)       Balance Overall  balance assessment: Modified Independent                                  Cognition                            Exercises Total Joint Exercises Ankle Circles/Pumps: AROM;Both;10 reps Quad Sets: AROM;Both;10 reps Knee Flexion: AAROM;Right;10 reps;Seated Goniometric ROM: -5 to 85* seated R knee flexion    General Comments        Pertinent Vitals/Pain Pain Score: 6  Pain Location: R knee Pain Descriptors / Indicators: Sore Pain Intervention(s): Monitored during session;Premedicated before session;Ice applied;Limited activity within patient's tolerance    Home Living                      Prior Function            PT Goals (current goals can now be found in the care plan section) Acute Rehab PT Goals Patient Stated Goal: to walk outside PT Goal Formulation: With patient Time For Goal Achievement: 10/23/13 Potential to Achieve Goals: Good Progress towards PT goals: Goals met/education completed, patient discharged from PT    Frequency  7X/week    PT Plan Current plan remains appropriate    Co-evaluation             End of Session Equipment Utilized During Treatment: Gait belt Activity Tolerance: Patient limited by pain;Other (comment) (pt became nauseous during knee flexion ROM exercises, VSS, some relief with cold washcloth to forehead and sips of  ginger ale)       Time: 9735-3299 PT Time Calculation (min): 40 min  Charges:  $Gait Training: 8-22 mins $Therapeutic Exercise: 8-22 mins $Self Care/Home Management: 8-22                    G Codes:      Philomena Doheny 10/22/2013, 9:34 AM [phone]

## 2013-10-22 NOTE — Discharge Summary (Signed)
Physician Discharge Summary   Patient ID: Renee [name] MRN: [id number] DOB/AGE: [birth year] 73 y.o.  Admit date: 10/20/2013 Discharge date: 10/22/2013  Primary Diagnosis: Osteoarthritis Right knee(s)   Admission Diagnoses:  Past Medical History  Diagnosis Date  . Blood transfusion 1948  . Cataract     both eyes  . Hyperlipidemia   . Hypertension   . Arthritis     oa  . DVT (deep venous thrombosis) 2002    left leg  . Diverticulosis   . Measles as child  . Mumps age 2  . COPD (chronic obstructive pulmonary disease)   . Disc disease, degenerative, cervical     trouble turing head and neck at times  . PONV (postoperative nausea and vomiting)   . Fibromyalgia   . Hx of skin cancer, basal cell   . Breast cancer 1994    bilateral / HX skin cancer  . Sinus tachycardia    Discharge Diagnoses:   Active Problems:   OA (osteoarthritis) of knee  Estimated body mass index is 24.88 kg/(m^2) as calculated from the following:   Height as of this encounter: 5' 4" (1.626 m).   Weight as of this encounter: 65.772 kg (145 lb).  Procedure:  Procedure(s) (LRB): RIGHT TOTAL KNEE ARTHROPLASTY (Right)   Consults: None  HPI: Renee [name] is a 73 y.o. year old female with end stage OA of her right knee with progressively worsening pain and dysfunction. She has constant pain, with activity and at rest and significant functional deficits with difficulties even with ADLs. She has had extensive non-op management including analgesics, injections of cortisone and viscosupplements, and home exercise program, but remains in significant pain with significant dysfunction.Radiographs show bone on bone arthritis medial and patellofemoral. She presents now for right Total Knee Arthroplasty.   Laboratory Data: Admission on 10/20/2013, Discharged on 10/22/2013  Component Date Value Ref Range Status  . WBC 10/21/2013 11.8* 4.0 - 10.5 K/uL Final  . RBC 10/21/2013 3.30* 3.87 - 5.11 MIL/uL Final  .  Hemoglobin 10/21/2013 10.7* 12.0 - 15.0 g/dL Final  . HCT 10/21/2013 30.9* 36.0 - 46.0 % Final  . MCV 10/21/2013 93.6  78.0 - 100.0 fL Final  . MCH 10/21/2013 32.4  26.0 - 34.0 pg Final  . MCHC 10/21/2013 34.6  30.0 - 36.0 g/dL Final  . RDW 10/21/2013 12.1  11.5 - 15.5 % Final  . Platelets 10/21/2013 220  150 - 400 K/uL Final  . Sodium 10/21/2013 141  137 - 147 mEq/L Final  . Potassium 10/21/2013 4.4  3.7 - 5.3 mEq/L Final  . Chloride 10/21/2013 106  96 - 112 mEq/L Final  . CO2 10/21/2013 27  19 - 32 mEq/L Final  . Glucose, Bld 10/21/2013 150* 70 - 99 mg/dL Final  . BUN 10/21/2013 10  6 - 23 mg/dL Final  . Creatinine, Ser 10/21/2013 0.58  0.50 - 1.10 mg/dL Final  . Calcium 10/21/2013 8.7  8.4 - 10.5 mg/dL Final  . GFR calc non Af Amer 10/21/2013 89* >90 mL/min Final  . GFR calc Af Amer 10/21/2013 >90  >90 mL/min Final   Comment: (NOTE)                          The eGFR has been calculated using the CKD EPI equation.                          This calculation  has not been validated in all clinical situations.                          eGFR's persistently <90 mL/min signify possible Chronic Kidney                          Disease.  . Anion gap 10/21/2013 8  5 - 15 Final  . WBC 10/22/2013 14.5* 4.0 - 10.5 K/uL Final  . RBC 10/22/2013 3.25* 3.87 - 5.11 MIL/uL Final  . Hemoglobin 10/22/2013 10.5* 12.0 - 15.0 g/dL Final  . HCT 10/22/2013 30.8* 36.0 - 46.0 % Final  . MCV 10/22/2013 94.8  78.0 - 100.0 fL Final  . MCH 10/22/2013 32.3  26.0 - 34.0 pg Final  . MCHC 10/22/2013 34.1  30.0 - 36.0 g/dL Final  . RDW 10/22/2013 12.2  11.5 - 15.5 % Final  . Platelets 10/22/2013 229  150 - 400 K/uL Final  . Sodium 10/22/2013 138  137 - 147 mEq/L Final  . Potassium 10/22/2013 5.1  3.7 - 5.3 mEq/L Final  . Chloride 10/22/2013 99  96 - 112 mEq/L Final  . CO2 10/22/2013 29  19 - 32 mEq/L Final  . Glucose, Bld 10/22/2013 144* 70 - 99 mg/dL Final  . BUN 10/22/2013 10  6 - 23 mg/dL Final  . Creatinine, Ser  10/22/2013 0.58  0.50 - 1.10 mg/dL Final  . Calcium 10/22/2013 9.2  8.4 - 10.5 mg/dL Final  . GFR calc non Af Amer 10/22/2013 89* >90 mL/min Final  . GFR calc Af Amer 10/22/2013 >90  >90 mL/min Final   Comment: (NOTE)                          The eGFR has been calculated using the CKD EPI equation.                          This calculation has not been validated in all clinical situations.                          eGFR's persistently <90 mL/min signify possible Chronic Kidney                          Disease.  Georgiann Hahn gap 10/22/2013 10  5 - 15 Final  Hospital Outpatient Visit on 10/13/2013  Component Date Value Ref Range Status  . aPTT 10/13/2013 32  24 - 37 seconds Final  . WBC 10/13/2013 4.9  4.0 - 10.5 K/uL Final  . RBC 10/13/2013 4.24  3.87 - 5.11 MIL/uL Final  . Hemoglobin 10/13/2013 13.8  12.0 - 15.0 g/dL Final  . HCT 10/13/2013 40.5  36.0 - 46.0 % Final  . MCV 10/13/2013 95.5  78.0 - 100.0 fL Final  . MCH 10/13/2013 32.5  26.0 - 34.0 pg Final  . MCHC 10/13/2013 34.1  30.0 - 36.0 g/dL Final  . RDW 10/13/2013 12.1  11.5 - 15.5 % Final  . Platelets 10/13/2013 227  150 - 400 K/uL Final  . Sodium 10/13/2013 138  137 - 147 mEq/L Final  . Potassium 10/13/2013 4.8  3.7 - 5.3 mEq/L Final  . Chloride 10/13/2013 99  96 - 112 mEq/L Final  . CO2 10/13/2013 29  19 - 32 mEq/L Final  .  Glucose, Bld 10/13/2013 149* 70 - 99 mg/dL Final  . BUN 10/13/2013 13  6 - 23 mg/dL Final  . Creatinine, Ser 10/13/2013 0.69  0.50 - 1.10 mg/dL Final  . Calcium 10/13/2013 10.0  8.4 - 10.5 mg/dL Final  . Total Protein 10/13/2013 7.4  6.0 - 8.3 g/dL Final  . Albumin 10/13/2013 4.1  3.5 - 5.2 g/dL Final  . AST 10/13/2013 21  0 - 37 U/L Final  . ALT 10/13/2013 10  0 - 35 U/L Final  . Alkaline Phosphatase 10/13/2013 73  39 - 117 U/L Final  . Total Bilirubin 10/13/2013 0.6  0.3 - 1.2 mg/dL Final  . GFR calc non Af Amer 10/13/2013 84* >90 mL/min Final  . GFR calc Af Amer 10/13/2013 >90  >90 mL/min Final    Comment: (NOTE)                          The eGFR has been calculated using the CKD EPI equation.                          This calculation has not been validated in all clinical situations.                          eGFR's persistently <90 mL/min signify possible Chronic Kidney                          Disease.  . Anion gap 10/13/2013 10  5 - 15 Final  . Prothrombin Time 10/13/2013 13.0  11.6 - 15.2 seconds Final  . INR 10/13/2013 0.98  0.00 - 1.49 Final  . ABO/RH(D) 10/13/2013 B NEG   Final  . Antibody Screen 10/13/2013 NEG   Final  . Sample Expiration 10/13/2013 10/23/2013   Final  . Color, Urine 10/13/2013 YELLOW  YELLOW Final  . APPearance 10/13/2013 CLOUDY* CLEAR Final  . Specific Gravity, Urine 10/13/2013 1.021  1.005 - 1.030 Final  . pH 10/13/2013 7.5  5.0 - 8.0 Final  . Glucose, UA 10/13/2013 NEGATIVE  NEGATIVE mg/dL Final  . Hgb urine dipstick 10/13/2013 NEGATIVE  NEGATIVE Final  . Bilirubin Urine 10/13/2013 NEGATIVE  NEGATIVE Final  . Ketones, ur 10/13/2013 NEGATIVE  NEGATIVE mg/dL Final  . Protein, ur 10/13/2013 NEGATIVE  NEGATIVE mg/dL Final  . Urobilinogen, UA 10/13/2013 0.2  0.0 - 1.0 mg/dL Final  . Nitrite 10/13/2013 NEGATIVE  NEGATIVE Final  . Leukocytes, UA 10/13/2013 SMALL* NEGATIVE Final  . MRSA, PCR 10/13/2013 NEGATIVE  NEGATIVE Final  . Staphylococcus aureus 10/13/2013 NEGATIVE  NEGATIVE Final   Comment:                                 The Xpert SA Assay (FDA                          approved for NASAL specimens                          in patients over 79 years of age),                          is one component of  a comprehensive surveillance                          program.  Test performance has                          been validated by [facility] for patients greater                          than or equal to 45 year old.                          It is not intended                          to diagnose  infection nor to                          guide or monitor treatment.  . Squamous Epithelial / LPF 10/13/2013 RARE  RARE Final  . WBC, UA 10/13/2013 0-2  <3 WBC/hpf Final  . Urine-Other 10/13/2013 AMORPHOUS URATES/PHOSPHATES   Final     X-Rays:No results found.  EKG: Orders placed in visit on 12/02/12  . EKG 12-LEAD     Hospital Course: Renee [name] is a 73 y.o. who was admitted to [facility]. They were brought to the operating room on 10/20/2013 and underwent Procedure(s): RIGHT TOTAL KNEE ARTHROPLASTY.  Patient tolerated the procedure well and was later transferred to the recovery room and then to the orthopaedic floor for postoperative care.  They were given PO and IV analgesics for pain control following their surgery.  They were given 24 hours of postoperative antibiotics of  Anti-infectives   Start     Dose/Rate Route Frequency Ordered Stop   10/20/13 1600  ceFAZolin (ANCEF) IVPB 2 g/50 mL premix     2 g 100 mL/hr over 30 Minutes Intravenous Every 6 hours 10/20/13 1259 10/20/13 2228   10/20/13 0919  ceFAZolin (ANCEF) IVPB 2 g/50 mL premix     2 g 100 mL/hr over 30 Minutes Intravenous On call to O.R. 10/20/13 0919 10/20/13 1010     and started on DVT prophylaxis in the form of Xarelto.   PT and OT were ordered for total joint protocol.  Discharge planning consulted to help with postop disposition and equipment needs.  Patient had a decent night on the evening of surgery.  She walked 60 feet the day of surgery.  They started to get up OOB with therapy on day one again.  Patient was well, and has had no acute complaints or problems and doing fairly well this morning.  Hemovac drain was pulled without difficulty.  Continued to work with therapy into day two.  Dressing was changed on day two and the incision was healing well. Patient was seen in rounds and was ready to go home following therapy.  Discharge home with home health  Diet - Cardiac diet and Diabetic diet  Follow  up - in 2 weeks  Activity - WBAT  Disposition - Home  Condition Upon Discharge - improving  D/C Meds - See DC Summary  DVT Prophylaxis - Xarelto  Discharge Instructions   Call MD / Call 911    Complete by:  As directed   If you experience chest pain or shortness of breath, CALL 911 and be transported to the hospital emergency room.  If you develope a fever above 101 F, pus (white drainage) or increased drainage or redness at the wound, or calf pain, call your surgeon's office.     Change dressing    Complete by:  As directed   Change dressing daily with sterile 4 x 4 inch gauze dressing and apply TED hose. Do not submerge the incision under water.     Constipation Prevention    Complete by:  As directed   Drink plenty of fluids.  Prune juice may be helpful.  You may use a stool softener, such as Colace (over the counter) 100 mg twice a day.  Use MiraLax (over the counter) for constipation as needed.     Diet - low sodium heart healthy    Complete by:  As directed      Discharge instructions    Complete by:  As directed   Pick up stool softner and laxative for home. Do not submerge incision under water. May shower. Continue to use ice for pain and swelling from surgery.  Take Xarelto for two and a half more weeks, then discontinue Xarelto. Once the patient has completed the Xarelto, they may resume the 81 mg Aspirin.     Do not put a pillow under the knee. Place it under the heel.    Complete by:  As directed      Do not sit on low chairs, stoools or toilet seats, as it may be difficult to get up from low surfaces    Complete by:  As directed      Driving restrictions    Complete by:  As directed   No driving until released by the physician.     Increase activity slowly as tolerated    Complete by:  As directed      Lifting restrictions    Complete by:  As directed   No lifting until released by the physician.     Patient may shower    Complete by:  As directed   You may  shower without a dressing once there is no drainage.  Do not wash over the wound.  If drainage remains, do not shower until drainage stops.     TED hose    Complete by:  As directed   Use stockings (TED hose) for 3 weeks on both leg(s).  You may remove them at night for sleeping.     Weight bearing as tolerated    Complete by:  As directed             Medication List    STOP taking these medications       aspirin 81 MG tablet     CALCIUM 600 + D PO     HYDROcodone-acetaminophen 5-325 MG per tablet  Commonly known as:  NORCO/VICODIN     multivitamin with minerals Tabs tablet     Omega 3 1200 MG Caps      TAKE these medications       atorvastatin 40 MG tablet  Commonly known as:  LIPITOR  Take 20 mg by mouth daily.     DULoxetine 60 MG capsule  Commonly known as:  CYMBALTA  Take 60 mg by mouth every evening.     HYDROmorphone 2 MG tablet  Commonly known as:  DILAUDID  Take 1-2 tablets (2-4 mg total) by mouth every 4 (four) hours as needed for moderate pain or severe pain.     losartan-hydrochlorothiazide 100-25 MG per tablet  Commonly known as:  HYZAAR  Take 0.5 tablets by mouth every morning.     Magnesium 250 MG Tabs  Take 1 tablet by mouth daily.     methocarbamol 500 MG tablet  Commonly known as:  ROBAXIN  Take 1 tablet (500 mg total) by mouth every 6 (six) hours as needed for muscle spasms.     metoprolol 50 MG tablet  Commonly known as:  LOPRESSOR  take 1 tablet by mouth twice a day     rivaroxaban 10 MG Tabs tablet  Commonly known as:  XARELTO  - Take 1 tablet (10 mg total) by mouth daily with breakfast. Take Xarelto for two and a half more weeks, then discontinue Xarelto.  - Once the patient has completed the Xarelto, they may resume the 81 mg Aspirin.     traMADol 50 MG tablet  Commonly known as:  ULTRAM  Take 1-2 tablets (50-100 mg total) by mouth every 6 (six) hours as needed (mild pain).       Follow-up Information   Follow up with  Gearlean Alf, MD. Schedule an appointment as soon as possible for a visit on 11/04/2013. (Call [phone] tomorrow to make the appointment)    Specialty:  Orthopedic Surgery   Contact information:   [address] [place] [address] [phone]       Signed: Arlee Muslim, PA-C Orthopaedic Surgery 10/29/2013, 2:20 PM

## 2013-10-22 NOTE — Progress Notes (Signed)
   Subjective: 2 Days Post-Op Procedure(s) (LRB): RIGHT TOTAL KNEE ARTHROPLASTY (Right) Patient reports pain as mild.  Had a little nausea earlier. Patient seen in rounds with Dr. Wynelle Link. Patient is well, but has had some minor complaints of pain in the knee, requiring pain medications Patient is ready to go home later today as long a sshe does well and no more nausea  Objective: Vital signs in last 24 hours: Temp:  [98 F (36.7 C)-98.3 F (36.8 C)] 98.3 F (36.8 C) (09/23 0448) Pulse Rate:  [72-88] 81 (09/23 0928) Resp:  [17-18] 17 (09/23 0448) BP: (120-136)/(45-76) 135/76 mmHg (09/23 0928) SpO2:  [96 %-99 %] 98 % (09/23 0928)  Intake/Output from previous day:  Intake/Output Summary (Last 24 hours) at 10/22/13 0956 Last data filed at 10/22/13 0900  Gross per 24 hour  Intake   1080 ml  Output   2100 ml  Net  -1020 ml    Intake/Output this shift: Total I/O In: 240 [P.O.:240] Out: 500 [Urine:500]  Labs:  Recent Labs  10/21/13 0434 10/22/13 0436  HGB 10.7* 10.5*    Recent Labs  10/21/13 0434 10/22/13 0436  WBC 11.8* 14.5*  RBC 3.30* 3.25*  HCT 30.9* 30.8*  PLT 220 229    Recent Labs  10/21/13 0434 10/22/13 0436  NA 141 138  K 4.4 5.1  CL 106 99  CO2 27 29  BUN 10 10  CREATININE 0.58 0.58  GLUCOSE 150* 144*  CALCIUM 8.7 9.2   No results found for this basename: LABPT, INR,  in the last 72 hours  EXAM: General - Patient is Alert and Appropriate Extremity - Neurovascular intact Sensation intact distally Incision - clean, dry, no drainage Motor Function - intact, moving foot and toes well on exam.   Assessment/Plan: 2 Days Post-Op Procedure(s) (LRB): RIGHT TOTAL KNEE ARTHROPLASTY (Right) Procedure(s) (LRB): RIGHT TOTAL KNEE ARTHROPLASTY (Right) Past Medical History  Diagnosis Date  . Blood transfusion 1948  . Cataract     both eyes  . Hyperlipidemia   . Hypertension   . Arthritis     oa  . DVT (deep venous thrombosis) 2002    left  leg  . Diverticulosis   . Measles as child  . Mumps age 64  . COPD (chronic obstructive pulmonary disease)   . Disc disease, degenerative, cervical     trouble turing head and neck at times  . PONV (postoperative nausea and vomiting)   . Fibromyalgia   . Hx of skin cancer, basal cell   . Breast cancer 1994    bilateral / HX skin cancer  . Sinus tachycardia    Active Problems:   OA (osteoarthritis) of knee  Estimated body mass index is 24.88 kg/(m^2) as calculated from the following:   Height as of this encounter: 5\' 4"  (1.626 m).   Weight as of this encounter: 65.772 kg (145 lb). Up with therapy Discharge home with home health Diet - Cardiac diet and Diabetic diet Follow up - in 2 weeks Activity - WBAT Disposition - Home Condition Upon Discharge - improving D/C Meds - See DC Summary DVT Prophylaxis - [place], PA-C Orthopaedic Surgery 10/22/2013, 9:56 AM

## 2013-10-22 NOTE — Plan of Care (Signed)
Problem: Consults Goal: Diagnosis- Total Joint Replacement Outcome: Completed/Met Date Met:  10/22/13 Primary Total Knee RIGHT  Problem: Phase III Progression Outcomes Goal: Anticoagulant follow-up in place Outcome: Not Applicable Date Met:  83/66/29 Xarelto VTE, no f/u needed.

## 2013-10-23 DIAGNOSIS — I10 Essential (primary) hypertension: Secondary | ICD-10-CM | POA: Diagnosis not present

## 2013-10-23 DIAGNOSIS — Z96651 Presence of right artificial knee joint: Secondary | ICD-10-CM | POA: Diagnosis not present

## 2013-10-23 DIAGNOSIS — Z471 Aftercare following joint replacement surgery: Secondary | ICD-10-CM | POA: Diagnosis not present

## 2013-10-23 NOTE — Progress Notes (Signed)
CARE MANAGEMENT NOTE 10/23/2013  Patient:  TIERNEY, BEHL   Account Number:  [id number]  Date Initiated:  10/23/2013  Documentation initiated by:  Ermelinda Eckert  Subjective/Objective Assessment:   RIGHT TOTAL KNEE ARTHROPLASTY     Action/Plan:   home with hhc   Anticipated DC Date:  10/22/2013   Anticipated DC Plan:  [place]  In-house referral  NA      [place]  CM consult      [facility] Choice  NA   Choice offered to / List presented to:  C-1 Patient   DME arranged  Vassie Moselle      DME agency  [place] arranged  [place].   Status of service:  Completed, signed off Medicare Important Message given?   (If response is "NO", the following Medicare IM given date fields will be blank) Date Medicare IM given:   Medicare IM given by:   Date Additional Medicare IM given:   Additional Medicare IM given by:    Discharge Disposition:  [place]  Per UR Regulation:  Reviewed for med. necessity/level of care/duration of stay  If discussed at [place] of Stay Meetings, dates discussed:    Comments:  Suanne Marker Dimitris Shanahan,RN,BSN,CCM

## 2013-10-24 DIAGNOSIS — Z96651 Presence of right artificial knee joint: Secondary | ICD-10-CM | POA: Diagnosis not present

## 2013-10-24 DIAGNOSIS — Z471 Aftercare following joint replacement surgery: Secondary | ICD-10-CM | POA: Diagnosis not present

## 2013-10-24 DIAGNOSIS — I10 Essential (primary) hypertension: Secondary | ICD-10-CM | POA: Diagnosis not present

## 2013-10-27 DIAGNOSIS — I10 Essential (primary) hypertension: Secondary | ICD-10-CM | POA: Diagnosis not present

## 2013-10-27 DIAGNOSIS — Z96651 Presence of right artificial knee joint: Secondary | ICD-10-CM | POA: Diagnosis not present

## 2013-10-27 DIAGNOSIS — Z471 Aftercare following joint replacement surgery: Secondary | ICD-10-CM | POA: Diagnosis not present

## 2013-10-29 DIAGNOSIS — Z96651 Presence of right artificial knee joint: Secondary | ICD-10-CM | POA: Diagnosis not present

## 2013-10-29 DIAGNOSIS — Z471 Aftercare following joint replacement surgery: Secondary | ICD-10-CM | POA: Diagnosis not present

## 2013-10-29 DIAGNOSIS — I10 Essential (primary) hypertension: Secondary | ICD-10-CM | POA: Diagnosis not present

## 2013-10-30 DIAGNOSIS — Z96651 Presence of right artificial knee joint: Secondary | ICD-10-CM | POA: Diagnosis not present

## 2013-10-30 DIAGNOSIS — I10 Essential (primary) hypertension: Secondary | ICD-10-CM | POA: Diagnosis not present

## 2013-10-30 DIAGNOSIS — Z471 Aftercare following joint replacement surgery: Secondary | ICD-10-CM | POA: Diagnosis not present

## 2013-10-31 DIAGNOSIS — Z471 Aftercare following joint replacement surgery: Secondary | ICD-10-CM | POA: Diagnosis not present

## 2013-10-31 DIAGNOSIS — Z96651 Presence of right artificial knee joint: Secondary | ICD-10-CM | POA: Diagnosis not present

## 2013-10-31 DIAGNOSIS — I10 Essential (primary) hypertension: Secondary | ICD-10-CM | POA: Diagnosis not present

## 2013-11-03 DIAGNOSIS — Z471 Aftercare following joint replacement surgery: Secondary | ICD-10-CM | POA: Diagnosis not present

## 2013-11-03 DIAGNOSIS — Z96651 Presence of right artificial knee joint: Secondary | ICD-10-CM | POA: Diagnosis not present

## 2013-11-03 DIAGNOSIS — I10 Essential (primary) hypertension: Secondary | ICD-10-CM | POA: Diagnosis not present

## 2013-11-05 DIAGNOSIS — Z471 Aftercare following joint replacement surgery: Secondary | ICD-10-CM | POA: Diagnosis not present

## 2013-11-05 DIAGNOSIS — Z96651 Presence of right artificial knee joint: Secondary | ICD-10-CM | POA: Diagnosis not present

## 2013-11-05 DIAGNOSIS — I10 Essential (primary) hypertension: Secondary | ICD-10-CM | POA: Diagnosis not present

## 2013-11-06 DIAGNOSIS — Z96651 Presence of right artificial knee joint: Secondary | ICD-10-CM | POA: Diagnosis not present

## 2013-11-06 DIAGNOSIS — Z471 Aftercare following joint replacement surgery: Secondary | ICD-10-CM | POA: Diagnosis not present

## 2013-11-06 DIAGNOSIS — I10 Essential (primary) hypertension: Secondary | ICD-10-CM | POA: Diagnosis not present

## 2013-11-07 DIAGNOSIS — Z471 Aftercare following joint replacement surgery: Secondary | ICD-10-CM | POA: Diagnosis not present

## 2013-11-07 DIAGNOSIS — I10 Essential (primary) hypertension: Secondary | ICD-10-CM | POA: Diagnosis not present

## 2013-11-07 DIAGNOSIS — Z96651 Presence of right artificial knee joint: Secondary | ICD-10-CM | POA: Diagnosis not present

## 2013-11-10 DIAGNOSIS — M1711 Unilateral primary osteoarthritis, right knee: Secondary | ICD-10-CM | POA: Diagnosis not present

## 2013-11-12 DIAGNOSIS — M1711 Unilateral primary osteoarthritis, right knee: Secondary | ICD-10-CM | POA: Diagnosis not present

## 2013-11-14 ENCOUNTER — Other Ambulatory Visit: Payer: Self-pay

## 2013-11-14 DIAGNOSIS — M1711 Unilateral primary osteoarthritis, right knee: Secondary | ICD-10-CM | POA: Diagnosis not present

## 2013-11-17 ENCOUNTER — Other Ambulatory Visit (HOSPITAL_COMMUNITY): Payer: Self-pay | Admitting: Orthopedic Surgery

## 2013-11-17 ENCOUNTER — Ambulatory Visit (HOSPITAL_COMMUNITY)
Admission: RE | Admit: 2013-11-17 | Discharge: 2013-11-17 | Disposition: A | Discharge status: Home / Self Care | Payer: Medicare Other | Source: Ambulatory Visit | Attending: Cardiology | Admitting: Cardiology

## 2013-11-17 DIAGNOSIS — M9711XA Periprosthetic fracture around internal prosthetic right knee joint, initial encounter: Secondary | ICD-10-CM

## 2013-11-17 DIAGNOSIS — M25561 Pain in right knee: Secondary | ICD-10-CM | POA: Diagnosis not present

## 2013-11-17 NOTE — Progress Notes (Signed)
Right Lower Extremity Venous Duplex Completed. No evidence for DVT or SVT. °Brianna L Mazza,RVT °

## 2013-11-19 DIAGNOSIS — M1711 Unilateral primary osteoarthritis, right knee: Secondary | ICD-10-CM | POA: Diagnosis not present

## 2013-11-21 DIAGNOSIS — M1711 Unilateral primary osteoarthritis, right knee: Secondary | ICD-10-CM | POA: Diagnosis not present

## 2013-11-24 DIAGNOSIS — M1711 Unilateral primary osteoarthritis, right knee: Secondary | ICD-10-CM | POA: Diagnosis not present

## 2013-11-25 DIAGNOSIS — Z471 Aftercare following joint replacement surgery: Secondary | ICD-10-CM | POA: Diagnosis not present

## 2013-11-25 DIAGNOSIS — M7071 Other bursitis of hip, right hip: Secondary | ICD-10-CM | POA: Diagnosis not present

## 2013-11-27 DIAGNOSIS — M810 Age-related osteoporosis without current pathological fracture: Secondary | ICD-10-CM | POA: Diagnosis not present

## 2013-11-27 DIAGNOSIS — Z79899 Other long term (current) drug therapy: Secondary | ICD-10-CM | POA: Diagnosis not present

## 2013-11-27 DIAGNOSIS — E780 Pure hypercholesterolemia: Secondary | ICD-10-CM | POA: Diagnosis not present

## 2013-11-27 DIAGNOSIS — E118 Type 2 diabetes mellitus with unspecified complications: Secondary | ICD-10-CM | POA: Diagnosis not present

## 2013-11-27 DIAGNOSIS — Z0000 Encounter for general adult medical examination without abnormal findings: Secondary | ICD-10-CM | POA: Diagnosis not present

## 2013-11-27 DIAGNOSIS — Z23 Encounter for immunization: Secondary | ICD-10-CM | POA: Diagnosis not present

## 2013-11-27 DIAGNOSIS — F419 Anxiety disorder, unspecified: Secondary | ICD-10-CM | POA: Diagnosis not present

## 2013-11-27 DIAGNOSIS — I10 Essential (primary) hypertension: Secondary | ICD-10-CM | POA: Diagnosis not present

## 2013-11-27 DIAGNOSIS — M8580 Other specified disorders of bone density and structure, unspecified site: Secondary | ICD-10-CM | POA: Diagnosis not present

## 2013-12-10 DIAGNOSIS — R300 Dysuria: Secondary | ICD-10-CM | POA: Diagnosis not present

## 2013-12-10 DIAGNOSIS — R358 Other polyuria: Secondary | ICD-10-CM | POA: Diagnosis not present

## 2013-12-15 IMAGING — CT CT ANGIO CHEST
3 of 6 series · 10 of 30 positions shown · IV contrast ([ID] [id number])
Comparison: Chest radiograph 02/09/2012

CLINICAL DATA: Shortness of breath for several months. History of
bilateral breast cancer and bilateral mastectomies.

EXAM:
CT ANGIOGRAPHY CHEST WITH CONTRAST
TECHNIQUE: Multidetector CT imaging of the chest was performed using the
standard protocol during bolus administration of intravenous
contrast. Multiplanar CT image reconstructions including MIPs were
obtained to evaluate the vascular anatomy.
CONTRAST:  100mL OMNIPAQUE IOHEXOL 350 MG/ML SOLN

[Series 4: pe 1.25 · axial · 0.70mm/px · z∈[-166,+10]mm · 5 of 212 slices shown]
[im 36/212  lung]
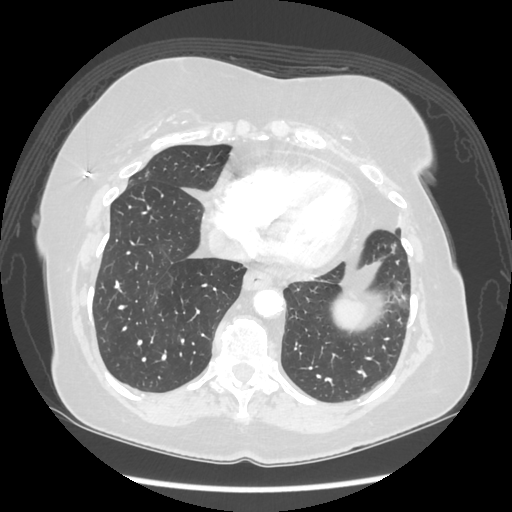
[im 71/212  mediastinal]
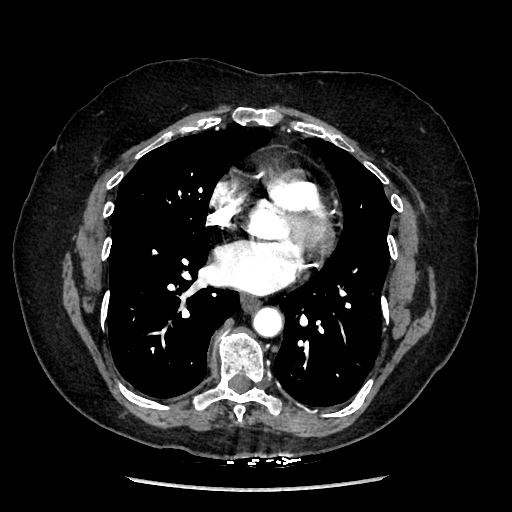
[im 106/212  lung]
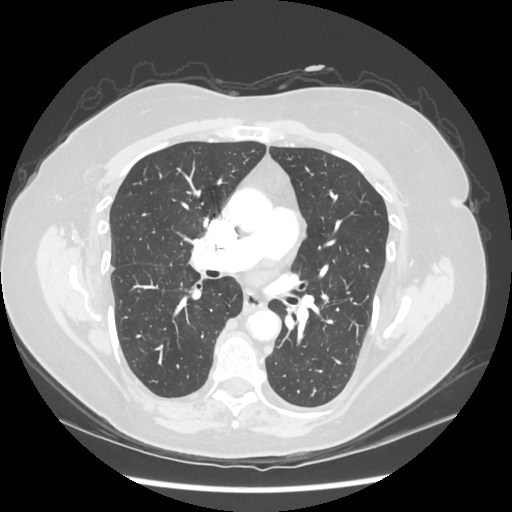
[im 141/212  mediastinal]
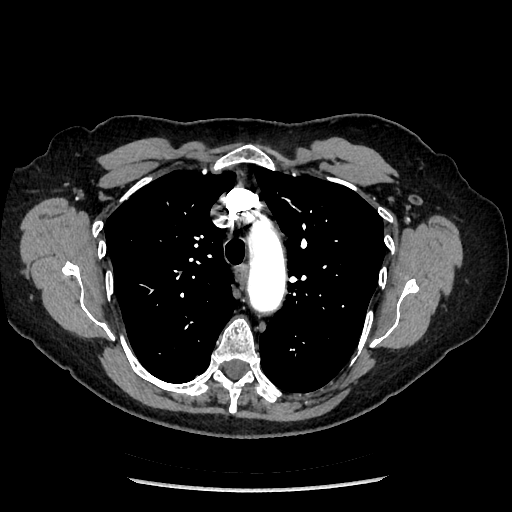
[im 176/212  lung]
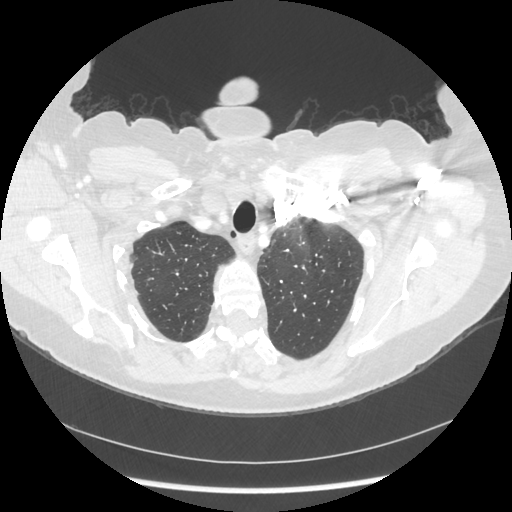

[Series 5: pe 2.5 · axial · 0.70mm/px · z∈[-120,-33]mm · 2 of 106 slices shown]
[im 36/106  lung]
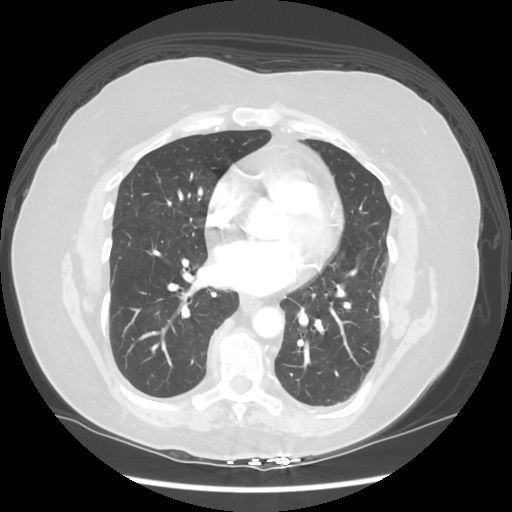
[im 71/106  lung]
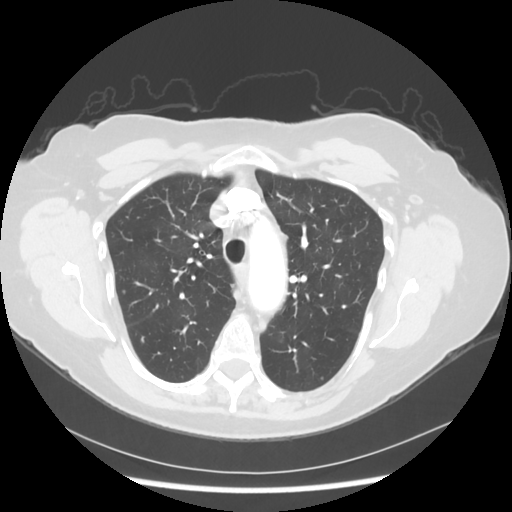

[Series 602: sagittal body · sagittal · 0.70mm/px · 3 of 145 slices shown]
[im 37/145  lung]
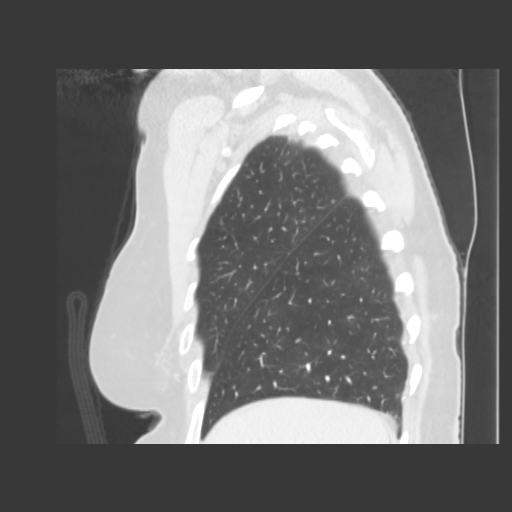
[im 73/145  lung]
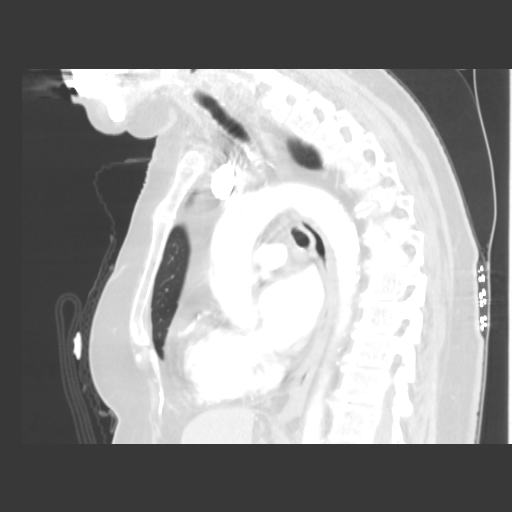
[im 109/145  lung]
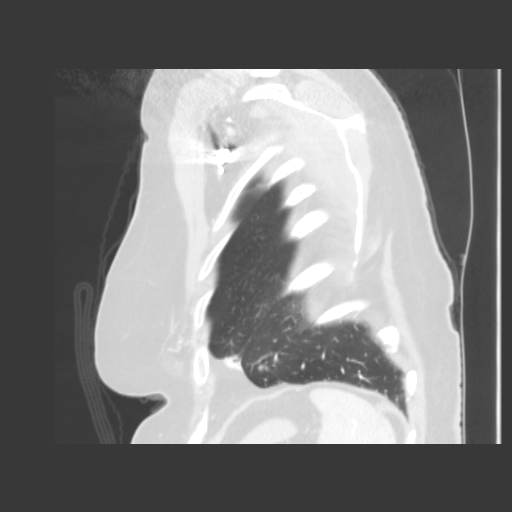

[10 of 30 positions shown; findings below may reference images not displayed]

FINDINGS: No evidence for of pulmonary embolism. No evidence for chest
lymphadenopathy. No significant pericardial or pleural fluid. There
is mild left pleural thickening. Images of the upper abdomen are
unremarkable. Metallic clips in the anterior chest soft tissues
bilaterally.

The trachea and mainstem bronchi are patent. There is a 2 mm
pleural-based nodule in the superior segment of the right lower lobe
on sequence 6, image 37. Punctate nodular density in the right lower
lobe along the pleural surface on image 57. Pleural-based density in
the right middle lobe measures 2 mm on image 84. Tiny pleural-based
nodule in the left lower lobe on image 91. 2 mm nodule in the
superior segment of the left lower lobe on image 30. Questionable 3
mm nodule in the lingula on image 60. There is no significant
airspace disease or consolidation. Old left posterior 9th rib
fracture.

Review of the MIP images confirms the above findings.
IMPRESSION: Negative for pulmonary embolism. No acute chest abnormalities.

Several punctate pulmonary nodules in both lungs. Nodules measure
between 2-3 mm and are nonspecific. If the patient is at high risk
for bronchogenic carcinoma, follow-up chest CT at 1year is
recommended. If the patient is at low risk, no follow-up is needed.
This recommendation follows the consensus statement: Guidelines for
Management of Small Pulmonary Nodules Detected on CT Scans: A
Statement from the [HOSPITAL] as published in Radiology

## 2013-12-16 DIAGNOSIS — E118 Type 2 diabetes mellitus with unspecified complications: Secondary | ICD-10-CM | POA: Diagnosis not present

## 2013-12-16 DIAGNOSIS — I10 Essential (primary) hypertension: Secondary | ICD-10-CM | POA: Diagnosis not present

## 2013-12-16 DIAGNOSIS — E780 Pure hypercholesterolemia: Secondary | ICD-10-CM | POA: Diagnosis not present

## 2013-12-16 DIAGNOSIS — Z23 Encounter for immunization: Secondary | ICD-10-CM | POA: Diagnosis not present

## 2013-12-16 DIAGNOSIS — M899 Disorder of bone, unspecified: Secondary | ICD-10-CM | POA: Diagnosis not present

## 2013-12-16 DIAGNOSIS — M2550 Pain in unspecified joint: Secondary | ICD-10-CM | POA: Diagnosis not present

## 2013-12-17 DIAGNOSIS — H25813 Combined forms of age-related cataract, bilateral: Secondary | ICD-10-CM | POA: Diagnosis not present

## 2014-01-12 DIAGNOSIS — G2581 Restless legs syndrome: Secondary | ICD-10-CM | POA: Diagnosis not present

## 2014-01-12 DIAGNOSIS — L309 Dermatitis, unspecified: Secondary | ICD-10-CM | POA: Diagnosis not present

## 2014-01-26 DIAGNOSIS — G2581 Restless legs syndrome: Secondary | ICD-10-CM | POA: Diagnosis not present

## 2014-01-26 DIAGNOSIS — E780 Pure hypercholesterolemia: Secondary | ICD-10-CM | POA: Diagnosis not present

## 2014-03-19 DIAGNOSIS — Z471 Aftercare following joint replacement surgery: Secondary | ICD-10-CM | POA: Diagnosis not present

## 2014-03-19 DIAGNOSIS — Z96651 Presence of right artificial knee joint: Secondary | ICD-10-CM | POA: Diagnosis not present

## 2014-03-26 DIAGNOSIS — I10 Essential (primary) hypertension: Secondary | ICD-10-CM | POA: Diagnosis not present

## 2014-03-26 DIAGNOSIS — E780 Pure hypercholesterolemia: Secondary | ICD-10-CM | POA: Diagnosis not present

## 2014-03-27 DIAGNOSIS — M542 Cervicalgia: Secondary | ICD-10-CM | POA: Diagnosis not present

## 2014-03-27 DIAGNOSIS — E780 Pure hypercholesterolemia: Secondary | ICD-10-CM | POA: Diagnosis not present

## 2014-04-23 DIAGNOSIS — E780 Pure hypercholesterolemia: Secondary | ICD-10-CM | POA: Diagnosis not present

## 2014-04-28 DIAGNOSIS — F411 Generalized anxiety disorder: Secondary | ICD-10-CM | POA: Diagnosis not present

## 2014-04-28 DIAGNOSIS — E780 Pure hypercholesterolemia: Secondary | ICD-10-CM | POA: Diagnosis not present

## 2014-04-28 DIAGNOSIS — E118 Type 2 diabetes mellitus with unspecified complications: Secondary | ICD-10-CM | POA: Diagnosis not present

## 2014-04-28 DIAGNOSIS — I10 Essential (primary) hypertension: Secondary | ICD-10-CM | POA: Diagnosis not present

## 2014-07-09 ENCOUNTER — Ambulatory Visit: Payer: Medicare Other | Admitting: Cardiology

## 2014-07-17 ENCOUNTER — Encounter: Payer: Self-pay | Admitting: Cardiology

## 2014-07-17 ENCOUNTER — Ambulatory Visit (INDEPENDENT_AMBULATORY_CARE_PROVIDER_SITE_OTHER): Payer: Medicare Other | Admitting: Cardiology

## 2014-07-17 VITALS — BP 122/60 | HR 60 | Ht 64.0 in | Wt 155.0 lb

## 2014-07-17 DIAGNOSIS — I10 Essential (primary) hypertension: Secondary | ICD-10-CM

## 2014-07-17 DIAGNOSIS — R0989 Other specified symptoms and signs involving the circulatory and respiratory systems: Secondary | ICD-10-CM

## 2014-07-17 DIAGNOSIS — E785 Hyperlipidemia, unspecified: Secondary | ICD-10-CM

## 2014-07-17 DIAGNOSIS — R0600 Dyspnea, unspecified: Secondary | ICD-10-CM

## 2014-07-17 DIAGNOSIS — I471 Supraventricular tachycardia: Secondary | ICD-10-CM | POA: Diagnosis not present

## 2014-07-17 DIAGNOSIS — R000 Tachycardia, unspecified: Secondary | ICD-10-CM

## 2014-07-17 MED ORDER — PRAVASTATIN SODIUM 20 MG PO TABS: 20.0000 mg | ORAL_TABLET | Freq: Every evening | ORAL | Status: DC

## 2014-07-17 NOTE — Patient Instructions (Addendum)
Medication Instructions:  1) START Pravastatin '20mg'$  once daily  Labwork: BMET, LFTs and Lipids in one month  Testing/Procedures: Your physician has requested that you have a carotid duplex. This test is an ultrasound of the carotid arteries in your neck. It looks at blood flow through these arteries that supply the brain with blood. Allow one hour for this exam. There are no restrictions or special instructions.    Follow-Up: Your physician wants you to follow-up in: 1 year with Dr. [name]. You will receive a reminder letter in the mail two months in advance. If you don't receive a letter, please call our office to schedule the follow-up appointment.   Any Other Special Instructions Will Be Listed Below (If Applicable).

## 2014-07-17 NOTE — Progress Notes (Signed)
Patient ID: QUINCY PRISCO, female   DOB: 02/26/1940, 74 y.o.   MRN: [id number]    Patient Name: Renee [name] Date of Encounter: 07/17/2014  Primary Care Provider:  Horatio Pel, MD Primary Cardiologist:  Dorothy Spark   Patient Profile  Tachycardia  Problem List   Past Medical History  Diagnosis Date  . Blood transfusion 1948  . Cataract     both eyes  . Hyperlipidemia   . Hypertension   . Arthritis     oa  . DVT (deep venous thrombosis) 2002    left leg  . Diverticulosis   . Measles as child  . Mumps age 46  . COPD (chronic obstructive pulmonary disease)   . Disc disease, degenerative, cervical     trouble turing head and neck at times  . PONV (postoperative nausea and vomiting)   . Fibromyalgia   . Hx of skin cancer, basal cell   . Breast cancer 1994    bilateral / HX skin cancer  . Sinus tachycardia    Past Surgical History  Procedure Laterality Date  . Colonoscopy    . Polypectomy    . Partial hysterectomy    . Mastectomy      bilateral  . Knee surgery  2002    arthroscopy-bilateral  . Abdominal hysterectomy  1976    partial  . Back surgery  1976    lower  . Bilateral mastectomy with tramflap reconstruction  1994  . Knee arthroscopy Right 2008  . Achilles tendon adn 2 bone spurs removed Left 05-2012  . 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 Total knee arthroplasty Right 10/20/2013    Procedure: RIGHT TOTAL KNEE ARTHROPLASTY;  Surgeon: Gearlean Alf, MD;  Location: [facility];  Service: Orthopedics;  Laterality: Right;   Allergies  Allergies  Allergen Reactions  . Aspirin Nausea Only  . Codeine Itching    Mood changes, can take percocet  . Ibuprofen Hives  . Lisinopril Cough   Chief complain: 1 year follow up  HPI  74 year old female with past medical history of hypertension  hyperlipidemia, who recently underwent total knee replacement and was found to be tachycardiac on multiple occasions.  The patient was seen by her primary care physician yesterday and because of the elevated d-dimer, a venous left lower extremity duplex was ordered and was negative for acute or chronic DVT. The patient is scheduled for chest CT today to rule out pulmonary embolism. The patient states that she feels significantly short of breath, simple tasks of daily living such as taking shower make her short of breath and sweats profusely. She denies any chest pain, palpitations, or syncope.  Patient is coming after 1 year, And states that she is feeling great. Since she started to take metoprolol her shortness of breath has resolved. She underwent a knee replacement with no complications, she exercises 20 minutes daily on a stationary bike.  She currently denies any palpitations, chest pain, palpitations, lower extremity edema orthopnea or personal nocturnal dyspnea. She has hyperlipidemia and was prescribed crestor, however stopped using it sec to muscle pain.   Home Medications  Prior to Admission medications   Medication Sig Start Date End Date Taking? Authorizing Provider  DULoxetine (CYMBALTA) 60 MG capsule Take 60 mg by mouth every evening.   Yes Historical Provider, MD  ezetimibe (ZETIA) 10 MG  tablet Take 10 mg by mouth every evening.    Yes Historical Provider, MD  HYDROmorphone (DILAUDID) 2 MG tablet Take 1-2 tablets (2-4 mg total) by mouth every 3 (three) hours as needed. 10/16/12  Yes Alexzandrew Dara Lords, PA-C  losartan-hydrochlorothiazide (HYZAAR) 100-25 MG per tablet Take 0.5 tablets by mouth every morning.    Yes Historical Provider, MD  methocarbamol (ROBAXIN) 500 MG tablet Take 1 tablet (500 mg total) by mouth every 6 (six) hours as needed. 10/16/12  Yes Alexzandrew Dara Lords, PA-C  rivaroxaban (XARELTO) 10 MG TABS tablet Take 1 tablet (10 mg total) by mouth daily with breakfast.  Take Xarelto for two and a half more weeks, then discontinue Xarelto. Once the patient has completed the Xarelto, they may resume the 81 mg Aspirin. 10/16/12  Yes Alexzandrew Dara Lords, PA-C  rosuvastatin (CRESTOR) 10 MG tablet Take 10 mg by mouth every evening.    Yes Historical Provider, MD    Family History  Family History  Problem Relation Age of Onset  . Heart disease Maternal Grandfather     CAD  . Alzheimer's disease Mother     Social History  History   Social History  . Marital Status: Married    Spouse Name: N/A  . Number of Children: 2  . Years of Education: N/A   Occupational History  . RETIRED    Social History Main Topics  . Smoking status: Former Smoker -- 1.00 packs/day for 20 years    Types: Cigarettes    Quit date: 02/11/1992  . Smokeless tobacco: Never Used  . Alcohol Use: 3.0 oz/week    5 Glasses of wine per week     Comment: occasional wine  . Drug Use: No  . Sexual Activity: Not on file   Other Topics Concern  . Not on file   Social History Narrative     Review of Systems as per HPI General:  No chills, fever, night sweats or weight changes.  Cardiovascular:  No chest pain, dyspnea on exertion, edema, orthopnea, palpitations, paroxysmal nocturnal dyspnea. Dermatological: No rash, lesions/masses Respiratory: No cough, dyspnea Urologic: No hematuria, dysuria Abdominal:   No nausea, vomiting, diarrhea, bright red blood per rectum, melena, or hematemesis Neurologic:  No visual changes, wkns, changes in mental status. All other systems reviewed and are otherwise negative except as noted above.  Physical Exam  BP 122/60, HR 60  General: Pleasant, NAD Psych: Normal affect. Neuro: Alert and oriented X 3. Moves all extremities spontaneously. HEENT: Normal  Neck: Supple, bruit over right carotid artery, no JVD. Lungs:  Resp regular and unlabored, CTA. Heart: RRR no s3, s4, or murmurs. Abdomen: Soft, non-tender, non-distended, BS + x 4.    Extremities: No clubbing, cyanosis or edema. DP/PT/Radials 2+ and equal bilaterally.  Accessory Clinical Findings  ECG - sinus tachycardia, 114 beats per minute, incomplete right bundle branch block  LE venous Duplex  - No evidence of deep vein or superficial thrombosis involving the left lower extremity and right common femoral vein. An enlarged inguinal lymph node is noted on the left. - No evidence of Baker's cyst on the left. Other specific details can be found in the table(s) above. Prepared and Electronically Authenticated by  Elam Dutch [id number]  CT PE IMPRESSION: Negative for pulmonary embolism. No acute chest abnormalities.  Several punctate pulmonary nodules in both lungs. Nodules measure between 2-3 mm and are nonspecific. If the patient is at high risk for bronchogenic carcinoma, follow-up chest CT at 1year is recommended. If  the patient is at low risk, no follow-up is needed. This recommendation follows the consensus statement: Guidelines for Management of Small Pulmonary Nodules Detected on CT Scans: A Statement from the [employer] as published in Radiology 2005; 237:395-400.  Electronically Signed By: Markus Daft M.D. On: 11/19/2012 15:52  TTE 12/03/2012 Study Conclusions  - Left ventricle: The cavity size was normal. Wall thickness was increased in a pattern of moderate LVH. Systolic function was normal. The estimated ejection fraction was in the range of 60% to 65%. - Atrial septum: No defect or patent foramen ovale was identified. - Pericardium, extracardiac: A trivial pericardial effusion was identified.   Exercise nuclear stress test: 12/24/12  Quantitative Gated Spect Images  QGS EDV: 60 ml  QGS ESV: 17 ml  Impression  Exercise Capacity: Poor exercise capacity.  BP Response: Hypertensive blood pressure response.  Clinical Symptoms: Dyspnea  ECG Impression: PACs, ventricular couplet. No ischemic ST segment  changes.  Comparison with Prior Nuclear Study: No significant change from previous study  Overall Impression: Normal stress nuclear study.  LV Ejection Fraction: 72%. LV Wall Motion: NL LV Function; NL Wall Motion  Loralie Champagne  12/24/2012   Assessment & Plan  74 year old female post left knee replacement with   1. Sinus tachycardia. Chest CT ruled out pulmonary embolism. Tachycardia has resolved with Metoprolol 25 mg po bid.  2. DOE - resolved. Exercise nuclear stress test showed good LV EF and no ischemia, but low exercise capacity, with hypertensive response and SOB after a short exercise time. Her echo shows moderate LVH concerning for longstanding HTN.  3. Hypertension - controlled  4. Hyperlipidemia - start pravastatin 20 mg po daily, d/c rosuvastatin, check CMP, lipids in 1 month.  5. Right carotid bruit - order B/L carotid Duplex  Follow up in 1 year.   Dorothy Spark, MD 07/17/2014, 9:59 AM

## 2014-07-20 ENCOUNTER — Other Ambulatory Visit: Payer: Self-pay | Admitting: Cardiology

## 2014-07-20 ENCOUNTER — Ambulatory Visit (HOSPITAL_COMMUNITY): Payer: Medicare Other | Attending: Cardiology

## 2014-07-20 DIAGNOSIS — I6523 Occlusion and stenosis of bilateral carotid arteries: Secondary | ICD-10-CM | POA: Diagnosis not present

## 2014-07-20 DIAGNOSIS — R0989 Other specified symptoms and signs involving the circulatory and respiratory systems: Secondary | ICD-10-CM

## 2014-07-21 ENCOUNTER — Other Ambulatory Visit: Payer: Self-pay

## 2014-07-23 ENCOUNTER — Telehealth: Payer: Self-pay | Admitting: *Deleted

## 2014-07-23 NOTE — Telephone Encounter (Signed)
-----   Message from Dorothy Spark, MD sent at 07/21/2014 12:54 PM EDT ----- She has B/L 40-59 % stenosis, no intervention needed, we will follow up in 1 year with another carotid Duplex

## 2014-07-23 NOTE — Telephone Encounter (Signed)
Patient notified of Carotid Doppler study results - "She has B/L 40-59 % stenosis, no intervention needed, we will follow up in 1 year with another carotid Duplex", per Dr. [name]. Patient verbalized understanding and appreciation for phone call. Denies further questions or concerns.

## 2014-07-27 ENCOUNTER — Other Ambulatory Visit: Payer: Self-pay

## 2014-08-07 DIAGNOSIS — M7071 Other bursitis of hip, right hip: Secondary | ICD-10-CM | POA: Diagnosis not present

## 2014-08-14 DIAGNOSIS — L570 Actinic keratosis: Secondary | ICD-10-CM | POA: Diagnosis not present

## 2014-08-17 ENCOUNTER — Other Ambulatory Visit (INDEPENDENT_AMBULATORY_CARE_PROVIDER_SITE_OTHER): Payer: Medicare Other | Admitting: *Deleted

## 2014-08-17 DIAGNOSIS — I10 Essential (primary) hypertension: Secondary | ICD-10-CM | POA: Diagnosis not present

## 2014-08-17 DIAGNOSIS — E785 Hyperlipidemia, unspecified: Secondary | ICD-10-CM

## 2014-08-17 DIAGNOSIS — R0600 Dyspnea, unspecified: Secondary | ICD-10-CM

## 2014-08-17 LAB — HEPATIC FUNCTION PANEL
ALT: 8 U/L (ref 0–35)
AST: 18 U/L (ref 0–37)
Albumin: 4.2 g/dL (ref 3.5–5.2)
Alkaline Phosphatase: 77 U/L (ref 39–117)
Bilirubin, Direct: 0.1 mg/dL (ref 0.0–0.3)
Total Bilirubin: 0.7 mg/dL (ref 0.2–1.2)
Total Protein: 7.2 g/dL (ref 6.0–8.3)

## 2014-08-17 LAB — BASIC METABOLIC PANEL
BUN: 14 mg/dL (ref 6–23)
CO2: 32 mEq/L (ref 19–32)
Calcium: 9.7 mg/dL (ref 8.4–10.5)
Chloride: 100 mEq/L (ref 96–112)
Creatinine, Ser: 0.73 mg/dL (ref 0.40–1.20)
GFR: 82.83 mL/min (ref >60.00)
Glucose, Bld: 109 mg/dL — ABNORMAL HIGH (ref 70–99)
Potassium: 4.1 mEq/L (ref 3.5–5.1)
Sodium: 139 mEq/L (ref 135–145)

## 2014-08-17 LAB — LIPID PANEL
Cholesterol: 217 mg/dL — ABNORMAL HIGH (ref 0–200)
HDL: 53.6 mg/dL (ref >39.00)
NonHDL: 163.4
Total CHOL/HDL Ratio: 4
Triglycerides: 255 mg/dL — ABNORMAL HIGH (ref 0.0–149.0)
VLDL: 51 mg/dL — ABNORMAL HIGH (ref 0.0–40.0)

## 2014-08-17 LAB — LDL CHOLESTEROL, DIRECT: Direct LDL: 124 mg/dL

## 2014-08-17 NOTE — Addendum Note (Signed)
Addended by: Eulis Foster on: 08/17/2014 09:09 AM   Modules accepted: Orders

## 2014-08-18 ENCOUNTER — Telehealth: Payer: Self-pay | Admitting: *Deleted

## 2014-08-18 DIAGNOSIS — E785 Hyperlipidemia, unspecified: Secondary | ICD-10-CM

## 2014-08-18 MED ORDER — PRAVASTATIN SODIUM 40 MG PO TABS: 40.0000 mg | ORAL_TABLET | Freq: Every evening | ORAL | Status: DC

## 2014-08-18 MED ORDER — EZETIMIBE 10 MG PO TABS: 10.0000 mg | ORAL_TABLET | Freq: Every day | ORAL | Status: DC

## 2014-08-18 NOTE — Telephone Encounter (Signed)
Contacted the pt to inform her that per Dr [name], after reviewing that the pt was fasting at her lab appt on 08/17/14, and pts request for increase in dose of current statin and trying a new diet regimen to lower her LDL and TG, she recommends the pt increase her pravastatin to 40 mg po daily and add zetia 10 mg po daily.  Also informed the pt that per Dr [name] she recommends the pt coming in for a CMET and Lipids in 3 months.  Confirmed the pharmacy of choice with the pt.  Scheduled the pt for a lab to check a cmet and lipids on 11/16/14 at our office.  Informed the pt that she needs to come fasting to this lab.  Pt verbalized understanding and agrees with this plan.

## 2014-08-18 NOTE — Telephone Encounter (Signed)
-----   Message from Dorothy Spark, MD sent at 08/18/2014 10:39 AM EDT ----- I would increase pravastatin to 40 mg po daily and add zetia 10 mg po daily. Then schedule CMP and lipids in 3 months. Thank you, Houston Siren  ----- Message -----    From: [name], LPN    Sent: 08/02/5007   9:49 AM      To: Dorothy Spark, MD  Informed the pt of normal LFTs, but elevated LDL and TG per Dr [name].  Pt states she was fasting for this lab appt.  Pt states she would like for Dr [name] to advise on a regimen that will either increase the dose in the current statin she is taking (for she tolerates this statin very well compared to others used), or advise on a diet regimen, to help with getting her LDL and TG back to normal.  Informed the pt that I will route this message to Dr [name] for further review and recommendation and follow-up with her thereafter.  Pt verbalized understanding and agrees with this plan.

## 2014-08-20 ENCOUNTER — Telehealth: Payer: Self-pay | Admitting: Cardiology

## 2014-08-20 NOTE — Telephone Encounter (Signed)
New message     Pt had called stating she would not be taking zetia due to expense but pt found coupon online for the medication. Pt states she will start taking medication today. Please call to discuss

## 2014-08-20 NOTE — Telephone Encounter (Signed)
Pt called to inform Dr [name] and myself that she now would like to resume back on taking Zetia, for she found a coupon/discount card online, for which her insurance company accepts, for up to 90 days.  Informed the pt that's great news and I will most definitely inform Dr [name] of this, for this is in her best interest in targeting her elevated LDL and TG.  Pt verbalized understanding and gracious for all the patience and assistance provided.

## 2014-08-20 NOTE — Telephone Encounter (Signed)
Pt calling to inform Dr [name] that she has decided not to take zetia she ordered, due to the cost with her insurance will be $300 a month.  Pt would just like to remain on her Pravastatin for hyperlipidemia.  Pt states she will modify her diet and eat cleaner.  Informed the pt that I will route this message to Dr [name] to make her aware of this, and follow-up with her if any new recommendations are made.  Pt verbalized understanding, agrees with this plan, and gracious for all the assistance provided

## 2014-08-20 NOTE — Telephone Encounter (Signed)
Follow Up    Pt is returning a call from earlier. Please call.

## 2014-09-01 DIAGNOSIS — Z96651 Presence of right artificial knee joint: Secondary | ICD-10-CM | POA: Diagnosis not present

## 2014-09-01 DIAGNOSIS — M7071 Other bursitis of hip, right hip: Secondary | ICD-10-CM | POA: Diagnosis not present

## 2014-09-01 DIAGNOSIS — Z471 Aftercare following joint replacement surgery: Secondary | ICD-10-CM | POA: Diagnosis not present

## 2014-09-10 DIAGNOSIS — Z87898 Personal history of other specified conditions: Secondary | ICD-10-CM | POA: Diagnosis not present

## 2014-09-10 DIAGNOSIS — I4510 Unspecified right bundle-branch block: Secondary | ICD-10-CM | POA: Diagnosis not present

## 2014-09-10 DIAGNOSIS — R0602 Shortness of breath: Secondary | ICD-10-CM | POA: Diagnosis not present

## 2014-09-10 DIAGNOSIS — F439 Reaction to severe stress, unspecified: Secondary | ICD-10-CM | POA: Diagnosis not present

## 2014-09-14 ENCOUNTER — Ambulatory Visit (INDEPENDENT_AMBULATORY_CARE_PROVIDER_SITE_OTHER): Payer: Medicare Other

## 2014-09-14 DIAGNOSIS — R000 Tachycardia, unspecified: Secondary | ICD-10-CM

## 2014-09-14 DIAGNOSIS — I471 Supraventricular tachycardia: Secondary | ICD-10-CM | POA: Diagnosis not present

## 2014-09-14 DIAGNOSIS — D225 Melanocytic nevi of trunk: Secondary | ICD-10-CM | POA: Diagnosis not present

## 2014-09-14 DIAGNOSIS — D1801 Hemangioma of skin and subcutaneous tissue: Secondary | ICD-10-CM | POA: Diagnosis not present

## 2014-09-14 DIAGNOSIS — L814 Other melanin hyperpigmentation: Secondary | ICD-10-CM | POA: Diagnosis not present

## 2014-09-14 DIAGNOSIS — L821 Other seborrheic keratosis: Secondary | ICD-10-CM | POA: Diagnosis not present

## 2014-10-06 ENCOUNTER — Emergency Department (INDEPENDENT_AMBULATORY_CARE_PROVIDER_SITE_OTHER)
Admission: EM | Admit: 2014-10-06 | Discharge: 2014-10-06 | Disposition: A | Discharge status: Home / Self Care | Payer: Medicare Other | Source: Home / Self Care | Attending: Emergency Medicine | Admitting: Emergency Medicine

## 2014-10-06 ENCOUNTER — Emergency Department (INDEPENDENT_AMBULATORY_CARE_PROVIDER_SITE_OTHER): Payer: Medicare Other

## 2014-10-06 ENCOUNTER — Encounter (HOSPITAL_COMMUNITY): Payer: Self-pay | Admitting: Emergency Medicine

## 2014-10-06 DIAGNOSIS — S060X0A Concussion without loss of consciousness, initial encounter: Secondary | ICD-10-CM

## 2014-10-06 DIAGNOSIS — S0993XA Unspecified injury of face, initial encounter: Secondary | ICD-10-CM

## 2014-10-06 MED ORDER — ONDANSETRON HCL 4 MG PO TABS: 4.0000 mg | ORAL_TABLET | Freq: Three times a day (TID) | ORAL | Status: DC | PRN

## 2014-10-06 NOTE — ED Notes (Signed)
Pt was pulling an old speaker off a high shelf by the cord when the speaker came down and hit her on the bridge of her nose and above her left inner eyebrow.  This happened about 0900. She denies losing consciousness, but states she has a headache and some mild nausea.  Pt has a small laceration on the bridge of her nose, swelling and a hematoma on her forehead and nose.

## 2014-10-06 NOTE — Discharge Instructions (Signed)
You do not have any broken bones. You do have a mild concussion. Take Zofran every 8 hours as needed for nausea. Use ice and Tylenol to help with the pain and swelling. Concussion symptoms should improve over the next few days. Take it easy mentally and physically until they resolve. If you start vomiting, your vision gets worse, or you start having trouble with your balance, please go straight to the emergency room.

## 2014-10-06 NOTE — ED Provider Notes (Signed)
CSN: [id number]     Arrival date & time 10/06/14  1304 History   First MD Initiated Contact with Patient 10/06/14 1338     Chief Complaint  Patient presents with  . Head Injury   (Consider location/radiation/quality/duration/timing/severity/associated sxs/prior Treatment) HPI  She is a 74 year old woman here for evaluation of face injury. This morning, she was getting something off a shelf by pulling on the cord and catching at, when it tipped and fell hitting her across the bridge of her nose and her left forehead. She denies falling or loss of consciousness. She does report some blurred vision, nausea, and headache. No vomiting or balance issues. No confusion or memory problems. She takes a baby aspirin a day.  Past Medical History  Diagnosis Date  . Blood transfusion 1948  . Cataract     both eyes  . Hyperlipidemia   . Hypertension   . Arthritis     oa  . DVT (deep venous thrombosis) 2002    left leg  . Diverticulosis   . Measles as child  . Mumps age 39  . COPD (chronic obstructive pulmonary disease)   . Disc disease, degenerative, cervical     trouble turing head and neck at times  . PONV (postoperative nausea and vomiting)   . Fibromyalgia   . Hx of skin cancer, basal cell   . Breast cancer 1994    bilateral / HX skin cancer  . Sinus tachycardia    Past Surgical History  Procedure Laterality Date  . Colonoscopy    . Polypectomy    . Partial hysterectomy    . Mastectomy      bilateral  . Knee surgery  2002    arthroscopy-bilateral  . Abdominal hysterectomy  1976    partial  . Back surgery  1976    lower  . Bilateral mastectomy with tramflap reconstruction  1994  . Knee arthroscopy Right 2008  . Achilles tendon adn 2 bone spurs removed Left 05-2012  . 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 Total knee arthroplasty Right  10/20/2013    Procedure: RIGHT TOTAL KNEE ARTHROPLASTY;  Surgeon: Gearlean Alf, MD;  Location: [facility];  Service: Orthopedics;  Laterality: Right;   Family History  Problem Relation Age of Onset  . Heart disease Maternal Grandfather     CAD  . Alzheimer's disease Mother    Social History  Substance Use Topics  . Smoking status: Former Smoker -- 1.00 packs/day for 20 years    Types: Cigarettes    Quit date: 02/11/1992  . Smokeless tobacco: Never Used  . Alcohol Use: 3.0 oz/week    5 Glasses of wine per week     Comment: occasional wine   OB History    No data available     Review of Systems As in history of present illness Allergies  Aspirin; Codeine; Ibuprofen; and Lisinopril  Home Medications   Prior to Admission medications   Medication Sig Start Date End Date Taking? Authorizing Provider  aspirin 81 MG tablet Take 81 mg by mouth at bedtime.   Yes Historical Provider, MD  losartan-hydrochlorothiazide (HYZAAR) 100-25 MG per tablet Take 0.5 tablets by mouth every morning.    Yes Historical Provider, MD  metoprolol (LOPRESSOR) 50 MG tablet take 1 tablet by mouth twice a day 07/21/14  Yes Dorothy Spark, MD  pravastatin (PRAVACHOL) 40 MG tablet Take 1 tablet (40 mg total) by mouth every evening. 08/18/14  Yes Dorothy Spark, MD  ezetimibe (ZETIA) 10 MG tablet Take 1 tablet (10 mg total) by mouth daily. 08/18/14   Dorothy Spark, MD  gabapentin (NEURONTIN) 300 MG capsule Take 300 mg by mouth 3 (three) times daily.    Historical Provider, MD  Magnesium 250 MG TABS Take 1 tablet by mouth daily.    Historical Provider, MD  ondansetron (ZOFRAN) 4 MG tablet Take 1 tablet (4 mg total) by mouth every 8 (eight) hours as needed for nausea or vomiting. 10/06/14   Melony Overly, MD   Meds Ordered and Administered this Visit  Medications - No data to display  BP 128/64 mmHg  Pulse 69  Temp(Src) 98.1 F (36.7 C) (Oral)  Resp 18  SpO2 99% No data found.   Physical Exam    Constitutional: She is oriented to person, place, and time. She appears well-developed and well-nourished. No distress.  HENT:  Head: Normocephalic.    Red is a superficial laceration. Blue is bruising. She is tender primarily at the right nasal bridge.  Eyes: EOM are normal. Pupils are equal, round, and reactive to light.  Neck: Neck supple.  Cardiovascular: Normal rate.   Pulmonary/Chest: Effort normal.  Neurological: She is alert and oriented to person, place, and time. Coordination normal.    ED Course  Procedures (including critical care time)  Labs Review Labs Reviewed - No data to display  Imaging Review Dg Facial Bones Complete  10/06/2014   CLINICAL DATA:  Status post injury to the face with a heavy speaker falling on patient's face and forehead.  EXAM: FACIAL BONES COMPLETE 3+V  COMPARISON:  None.  FINDINGS: There is no evidence of fracture or other significant bone abnormality. No orbital emphysema or sinus air-fluid levels are seen.  IMPRESSION: No radiographic evidence of facial fractures.   Electronically Signed   By: Fidela Salisbury M.D.   On: 10/06/2014 14:34     Visual Acuity Review  Right Eye Distance: 20/50 Left Eye Distance: 20/40 Bilateral Distance: 20/30   MDM   1. Facial injury, initial encounter   2. Concussion, without loss of consciousness, initial encounter    No evidence for fracture on x-ray. Neurologic exam is normal. No evidence for intracranial bleed. She likely has a mild concussion. Discussed rest until symptoms resolve. Zofran prescription given to use for nausea. Recommended ice and Tylenol for the bruising and pain. Strict return precautions given.  Melony Overly, MD 10/06/14 [phone]

## 2014-10-15 DIAGNOSIS — H524 Presbyopia: Secondary | ICD-10-CM | POA: Diagnosis not present

## 2014-10-15 DIAGNOSIS — H5203 Hypermetropia, bilateral: Secondary | ICD-10-CM | POA: Diagnosis not present

## 2014-10-15 DIAGNOSIS — H52223 Regular astigmatism, bilateral: Secondary | ICD-10-CM | POA: Diagnosis not present

## 2014-10-15 DIAGNOSIS — S0512XA Contusion of eyeball and orbital tissues, left eye, initial encounter: Secondary | ICD-10-CM | POA: Diagnosis not present

## 2014-10-21 DIAGNOSIS — H9311 Tinnitus, right ear: Secondary | ICD-10-CM | POA: Diagnosis not present

## 2014-10-21 DIAGNOSIS — H93291 Other abnormal auditory perceptions, right ear: Secondary | ICD-10-CM | POA: Diagnosis not present

## 2014-10-21 DIAGNOSIS — H9201 Otalgia, right ear: Secondary | ICD-10-CM | POA: Diagnosis not present

## 2014-10-21 DIAGNOSIS — H6123 Impacted cerumen, bilateral: Secondary | ICD-10-CM | POA: Diagnosis not present

## 2014-11-16 ENCOUNTER — Other Ambulatory Visit: Payer: Self-pay | Admitting: Cardiology

## 2014-11-16 ENCOUNTER — Other Ambulatory Visit (INDEPENDENT_AMBULATORY_CARE_PROVIDER_SITE_OTHER): Payer: Medicare Other | Admitting: *Deleted

## 2014-11-16 DIAGNOSIS — E785 Hyperlipidemia, unspecified: Secondary | ICD-10-CM

## 2014-11-16 LAB — LIPID PANEL
Cholesterol: 186 mg/dL (ref 125–200)
HDL: 55 mg/dL (ref >=46)
LDL Cholesterol: 109 mg/dL (ref <130)
Total CHOL/HDL Ratio: 3.4 Ratio (ref <=5.0)
Triglycerides: 108 mg/dL (ref <150)
VLDL: 22 mg/dL (ref <30)

## 2014-11-16 LAB — BASIC METABOLIC PANEL
BUN: 15 mg/dL (ref 7–25)
CO2: 28 mmol/L (ref 20–31)
Calcium: 9.5 mg/dL (ref 8.6–10.4)
Chloride: 104 mmol/L (ref 98–110)
Creat: 0.67 mg/dL (ref 0.60–0.93)
Glucose, Bld: 111 mg/dL — ABNORMAL HIGH (ref 65–99)
Potassium: 4.3 mmol/L (ref 3.5–5.3)
Sodium: 140 mmol/L (ref 135–146)

## 2014-11-16 NOTE — Addendum Note (Signed)
Addended by: Eulis Foster on: 11/16/2014 08:46 AM   Modules accepted: Orders

## 2014-11-16 NOTE — Addendum Note (Signed)
Addended by: Eulis Foster on: 11/16/2014 08:45 AM   Modules accepted: Orders

## 2014-11-17 LAB — HEPATIC FUNCTION PANEL
ALT: 16 U/L (ref 6–29)
AST: 39 U/L — ABNORMAL HIGH (ref 10–35)
Albumin: 4.2 g/dL (ref 3.6–5.1)
Alkaline Phosphatase: 63 U/L (ref 33–130)
Bilirubin, Direct: 0.1 mg/dL (ref <=0.2)
Indirect Bilirubin: 0.5 mg/dL (ref 0.2–1.2)
Total Bilirubin: 0.6 mg/dL (ref 0.2–1.2)
Total Protein: 7 g/dL (ref 6.1–8.1)

## 2014-11-24 ENCOUNTER — Encounter: Payer: Self-pay | Admitting: Gastroenterology

## 2014-12-04 DIAGNOSIS — Z96651 Presence of right artificial knee joint: Secondary | ICD-10-CM | POA: Diagnosis not present

## 2014-12-04 DIAGNOSIS — Z471 Aftercare following joint replacement surgery: Secondary | ICD-10-CM | POA: Diagnosis not present

## 2014-12-10 DIAGNOSIS — M25561 Pain in right knee: Secondary | ICD-10-CM | POA: Diagnosis not present

## 2014-12-15 DIAGNOSIS — M25561 Pain in right knee: Secondary | ICD-10-CM | POA: Diagnosis not present

## 2014-12-17 DIAGNOSIS — M25561 Pain in right knee: Secondary | ICD-10-CM | POA: Diagnosis not present

## 2014-12-22 DIAGNOSIS — M25561 Pain in right knee: Secondary | ICD-10-CM | POA: Diagnosis not present

## 2014-12-29 DIAGNOSIS — M25561 Pain in right knee: Secondary | ICD-10-CM | POA: Diagnosis not present

## 2014-12-31 DIAGNOSIS — M25561 Pain in right knee: Secondary | ICD-10-CM | POA: Diagnosis not present

## 2014-12-31 DIAGNOSIS — I10 Essential (primary) hypertension: Secondary | ICD-10-CM | POA: Diagnosis not present

## 2014-12-31 DIAGNOSIS — K219 Gastro-esophageal reflux disease without esophagitis: Secondary | ICD-10-CM | POA: Diagnosis not present

## 2014-12-31 DIAGNOSIS — Z0000 Encounter for general adult medical examination without abnormal findings: Secondary | ICD-10-CM | POA: Diagnosis not present

## 2014-12-31 DIAGNOSIS — N390 Urinary tract infection, site not specified: Secondary | ICD-10-CM | POA: Diagnosis not present

## 2014-12-31 DIAGNOSIS — E7800 Pure hypercholesterolemia, unspecified: Secondary | ICD-10-CM | POA: Diagnosis not present

## 2015-01-04 DIAGNOSIS — I10 Essential (primary) hypertension: Secondary | ICD-10-CM | POA: Diagnosis not present

## 2015-01-04 DIAGNOSIS — R05 Cough: Secondary | ICD-10-CM | POA: Diagnosis not present

## 2015-01-04 DIAGNOSIS — E7800 Pure hypercholesterolemia, unspecified: Secondary | ICD-10-CM | POA: Diagnosis not present

## 2015-01-04 DIAGNOSIS — M8580 Other specified disorders of bone density and structure, unspecified site: Secondary | ICD-10-CM | POA: Diagnosis not present

## 2015-01-15 DIAGNOSIS — Z471 Aftercare following joint replacement surgery: Secondary | ICD-10-CM | POA: Diagnosis not present

## 2015-01-15 DIAGNOSIS — Z96651 Presence of right artificial knee joint: Secondary | ICD-10-CM | POA: Diagnosis not present

## 2015-01-18 ENCOUNTER — Other Ambulatory Visit: Payer: Self-pay | Admitting: Internal Medicine

## 2015-01-18 DIAGNOSIS — R0600 Dyspnea, unspecified: Secondary | ICD-10-CM

## 2015-01-19 ENCOUNTER — Ambulatory Visit (INDEPENDENT_AMBULATORY_CARE_PROVIDER_SITE_OTHER): Payer: Medicare Other | Admitting: Internal Medicine

## 2015-01-19 DIAGNOSIS — R0600 Dyspnea, unspecified: Secondary | ICD-10-CM

## 2015-01-19 LAB — PULMONARY FUNCTION TEST
DL/VA % pred: 108 %
DL/VA: 5.36 ml/min/mmHg/L
DLCO unc % pred: 70 %
DLCO unc: 17.97 ml/min/mmHg
FEF 25-75 Post: 1.05 L/sec
FEF 25-75 Pre: 0.48 L/sec
FEF2575-%Change-Post: 118 %
FEF2575-%Pred-Post: 60 %
FEF2575-%Pred-Pre: 27 %
FEV1-%Change-Post: 23 %
FEV1-%Pred-Post: 54 %
FEV1-%Pred-Pre: 44 %
FEV1-Post: 1.21 L
FEV1-Pre: 0.98 L
FEV1FVC-%Change-Post: 2 %
FEV1FVC-%Pred-Pre: 85 %
FEV6-%Change-Post: 20 %
FEV6-%Pred-Post: 65 %
FEV6-%Pred-Pre: 54 %
FEV6-Post: 1.85 L
FEV6-Pre: 1.53 L
FEV6FVC-%Change-Post: 0 %
FEV6FVC-%Pred-Post: 104 %
FEV6FVC-%Pred-Pre: 104 %
FVC-%Change-Post: 20 %
FVC-%Pred-Post: 62 %
FVC-%Pred-Pre: 51 %
FVC-Post: 1.85 L
FVC-Pre: 1.54 L
Post FEV1/FVC ratio: 66 %
Post FEV6/FVC ratio: 100 %
Pre FEV1/FVC ratio: 64 %
Pre FEV6/FVC Ratio: 100 %
RV % pred: 121 %
RV: 2.83 L
TLC % pred: 88 %
TLC: 4.6 L

## 2015-01-19 NOTE — Progress Notes (Signed)
PFT done today. 

## 2015-01-28 ENCOUNTER — Ambulatory Visit: Payer: Medicare Other | Admitting: Internal Medicine

## 2015-02-08 ENCOUNTER — Ambulatory Visit: Payer: Medicare Other | Admitting: Internal Medicine

## 2015-02-11 ENCOUNTER — Encounter: Payer: Self-pay | Admitting: Internal Medicine

## 2015-02-11 ENCOUNTER — Ambulatory Visit (INDEPENDENT_AMBULATORY_CARE_PROVIDER_SITE_OTHER): Payer: Medicare Other | Admitting: Internal Medicine

## 2015-02-11 VITALS — BP 122/70 | HR 70 | Ht 65.0 in | Wt 155.8 lb

## 2015-02-11 DIAGNOSIS — R918 Other nonspecific abnormal finding of lung field: Secondary | ICD-10-CM | POA: Insufficient documentation

## 2015-02-11 NOTE — Patient Instructions (Addendum)
Please see patient coordinator before you leave today  to schedule CT w/o contrast f/u multiple nodules > I will call you with results

## 2015-02-11 NOTE — Assessment & Plan Note (Addendum)
See CT 11/19/12  none > 3 mm  Because of her remote smoking hx her risk is very low but not zero  CT results reviewed with pt >>> Too small for PET or bx, not suspicious enough for excisional bx > really only option for now is follow the Fleischner society guidelines as rec by radiology> missed f/u at 2015 and 2016 so if no change now can stop doing f/u ct as radiographically can be considered benign  Discussed in detail all the  indications, usual  risks and alternatives  relative to the benefits with patient who agrees to proceed with conservative f/u as outlined    Total time devoted to counseling  = 25/45 m review case with pt/ discussion of options/alternatives/ giving and going over instructions (see avs)

## 2015-02-11 NOTE — Progress Notes (Signed)
Subjective:    Patient ID: Renee [name], female    DOB: December 11, 1940    MRN: [id number]     Brief patient profile:  10 yowf quit smoking 1994 no respiratory problems referred 02/09/2012  by Dr Shelia Media for evaluation of sob.    History of Present Illness  02/09/2012 1st pulmonary eval in EMR era cc onset of doe  several years prior to OV indolent onset but not really progressive, can walk a block and do housework x 30 min but struggles and has to sit down at end.  Also pain in back and under L ant rib cage comes and goes x sev weeks daytime, crampy, no pleuritic or ex features, no radiation or change in bowel or bladder habits rec Try off crestor, coQ 10 and fish oil for a month and also calcium citrate instead of carbonate. GERD diet As far the pain in your left upper quadrant it is likely related to gas Treatment consists of avoiding foods that cause gas (especially beans and raw vegetables like spinach and salads)  and citrucel 1 heaping tsp twice daily with a large glass of water.  Pain should improve w/in 2 weeks and if not then consider further GI work up.    Please schedule a follow up office visit in 4 weeks, sooner if needed with pfts  >  did not return    02/11/2015 1st [facility] office visit in 3 years so new pt eval/ Renee [name]  Consult per Dr Shelia Media re MPNs Chief Complaint  Patient presents with  . Lung Nodules    Pt here for check up on lung nodules. No longer coughing.   Not limited by breathing from desired activities    No obvious day to day or daytime variability or assoc chronic cough or cp or chest tightness, subjective wheeze or overt sinus or hb symptoms. No unusual exp hx or h/o childhood pna/ asthma or knowledge of premature birth.  Sleeping ok without nocturnal  or early am exacerbation  of respiratory  c/o's or need for noct saba. Also denies any obvious fluctuation of symptoms with weather or environmental changes or other aggravating or alleviating factors except  as outlined above   Current Medications, Allergies, Complete Past Medical History, Past Surgical History, Family History, and Social History were reviewed in [employer] record.  ROS  The following are not active complaints unless bolded sore throat, dysphagia, dental problems, itching, sneezing,  nasal congestion or excess/ purulent secretions, ear ache,   fever, chills, sweats, unintended wt loss, classically pleuritic or exertional cp, hemoptysis,  orthopnea pnd or leg swelling, presyncope, palpitations, abdominal pain, anorexia, nausea, vomiting, diarrhea  or change in bowel or bladder habits, change in stools or urine, dysuria,hematuria,  rash, arthralgias, visual complaints, headache, numbness, weakness or ataxia or problems with walking or coordination,  change in mood/affect or memory.           Objective:   Physical Exam  amb wf nad    02/11/2015       156   02/09/12 168 lb 3.2 oz (76.295 kg)  05/23/10 140 lb (63.504 kg)  05/12/10 144 lb 3.2 oz (65.409 kg)    HEENT: nl dentition, turbinates, and orophanx. Nl external ear canals without cough reflex   NECK :  without JVD/Nodes/TM/ nl carotid upstrokes bilaterally   LUNGS: no acc muscle use, clear to A and P bilaterally without cough on insp or exp maneuvers   CV:  RRR  no s3 or murmur or increase in P2, no edema   ABD:  soft and nontender with nl excursion in the supine position. No bruits or organomegaly, bowel sounds nl  MS:  warm without deformities, calf tenderness, cyanosis or clubbing  SKIN: warm and dry without lesions    NEURO:  alert, approp, no deficits        I personally reviewed images and agree with radiology impression as follows:  CT Chest  11/19/2012 Negative for pulmonary embolism. No acute chest abnormalities.  Several punctate pulmonary nodules in both lungs. Nodules measure between 2-3 mm and are nonspecific. If the patient is at high risk for bronchogenic carcinoma,  follow-up chest CT at 1year is recommended.           Assessment & Plan:

## 2015-02-16 ENCOUNTER — Ambulatory Visit (INDEPENDENT_AMBULATORY_CARE_PROVIDER_SITE_OTHER)
Admission: RE | Admit: 2015-02-16 | Discharge: 2015-02-16 | Disposition: A | Discharge status: Home / Self Care | Payer: Medicare Other | Source: Ambulatory Visit | Attending: Internal Medicine | Admitting: Internal Medicine

## 2015-02-16 DIAGNOSIS — R918 Other nonspecific abnormal finding of lung field: Secondary | ICD-10-CM | POA: Diagnosis not present

## 2015-02-16 NOTE — Progress Notes (Signed)
Quick Note:  Spoke with pt and notified of results per Dr. Wert. Pt verbalized understanding and denied any questions.  ______ 

## 2015-05-11 ENCOUNTER — Encounter: Payer: Self-pay | Admitting: Gastroenterology

## 2015-05-12 ENCOUNTER — Encounter: Payer: Self-pay | Admitting: Internal Medicine

## 2015-05-25 DIAGNOSIS — E7800 Pure hypercholesterolemia, unspecified: Secondary | ICD-10-CM | POA: Diagnosis not present

## 2015-05-25 DIAGNOSIS — E118 Type 2 diabetes mellitus with unspecified complications: Secondary | ICD-10-CM | POA: Diagnosis not present

## 2015-06-22 ENCOUNTER — Ambulatory Visit (AMBULATORY_SURGERY_CENTER): Payer: Self-pay | Admitting: *Deleted

## 2015-06-22 VITALS — Ht 65.0 in | Wt 162.0 lb

## 2015-06-22 DIAGNOSIS — Z860100 Personal history of colon polyps, unspecified: Secondary | ICD-10-CM

## 2015-06-22 MED ORDER — NA SULFATE-K SULFATE-MG SULF 17.5-3.13-1.6 GM/177ML PO SOLN
1.0000 | Freq: Once | ORAL | Status: DC
Start: 2015-06-22 — End: 2015-07-06

## 2015-06-22 NOTE — Progress Notes (Signed)
No egg or soy allergy known to patient   issues with past sedation with any surgeries  or procedures- hx of post op N/V ,  no intubation problems  No diet pills per patient No home 02 use per patient  No blood thinners per patient  Pt denies issues with constipation  emmi video to e mail  Per last colon note by dr Sharlett Iles, pt needs a pediatric scope - redundant, tortuous colon

## 2015-06-25 DIAGNOSIS — L239 Allergic contact dermatitis, unspecified cause: Secondary | ICD-10-CM | POA: Diagnosis not present

## 2015-06-29 ENCOUNTER — Telehealth: Payer: Self-pay | Admitting: Cardiology

## 2015-06-29 DIAGNOSIS — I6523 Occlusion and stenosis of bilateral carotid arteries: Secondary | ICD-10-CM

## 2015-06-29 NOTE — Telephone Encounter (Signed)
Done

## 2015-06-29 NOTE — Telephone Encounter (Signed)
New Message  Recall was in the system for Carotid but there wasn't an order. Please place order to attatch to carotid appt scheduled on 07/22/2015 at [place].

## 2015-07-01 DIAGNOSIS — D485 Neoplasm of uncertain behavior of skin: Secondary | ICD-10-CM | POA: Diagnosis not present

## 2015-07-01 DIAGNOSIS — L570 Actinic keratosis: Secondary | ICD-10-CM | POA: Diagnosis not present

## 2015-07-01 DIAGNOSIS — I10 Essential (primary) hypertension: Secondary | ICD-10-CM | POA: Diagnosis not present

## 2015-07-01 DIAGNOSIS — E118 Type 2 diabetes mellitus with unspecified complications: Secondary | ICD-10-CM | POA: Diagnosis not present

## 2015-07-05 ENCOUNTER — Encounter: Payer: Self-pay | Admitting: *Deleted

## 2015-07-05 DIAGNOSIS — M8580 Other specified disorders of bone density and structure, unspecified site: Secondary | ICD-10-CM | POA: Diagnosis not present

## 2015-07-05 DIAGNOSIS — M859 Disorder of bone density and structure, unspecified: Secondary | ICD-10-CM | POA: Diagnosis not present

## 2015-07-05 DIAGNOSIS — R079 Chest pain, unspecified: Secondary | ICD-10-CM | POA: Diagnosis not present

## 2015-07-06 ENCOUNTER — Encounter: Payer: Self-pay | Admitting: Internal Medicine

## 2015-07-06 ENCOUNTER — Ambulatory Visit (AMBULATORY_SURGERY_CENTER): Payer: Medicare Other | Admitting: Internal Medicine

## 2015-07-06 VITALS — BP 129/64 | HR 67 | Temp 97.8°F | Resp 20 | Ht 65.0 in | Wt 162.0 lb

## 2015-07-06 DIAGNOSIS — D123 Benign neoplasm of transverse colon: Secondary | ICD-10-CM

## 2015-07-06 DIAGNOSIS — Z860100 Personal history of colon polyps, unspecified: Secondary | ICD-10-CM

## 2015-07-06 DIAGNOSIS — I10 Essential (primary) hypertension: Secondary | ICD-10-CM | POA: Diagnosis not present

## 2015-07-06 DIAGNOSIS — D122 Benign neoplasm of ascending colon: Secondary | ICD-10-CM | POA: Diagnosis not present

## 2015-07-06 DIAGNOSIS — D120 Benign neoplasm of cecum: Secondary | ICD-10-CM | POA: Diagnosis not present

## 2015-07-06 DIAGNOSIS — D124 Benign neoplasm of descending colon: Secondary | ICD-10-CM

## 2015-07-06 DIAGNOSIS — J449 Chronic obstructive pulmonary disease, unspecified: Secondary | ICD-10-CM | POA: Diagnosis not present

## 2015-07-06 MED ORDER — SODIUM CHLORIDE 0.9 % IV SOLN: 500.0000 mL | INTRAVENOUS | Status: DC

## 2015-07-06 NOTE — Progress Notes (Signed)
No egg or soy allergy known to patient  No issues with past sedation with any surgeries  or procedures, no intubation problems  No diet pills per patient No home 02 use per patient  No blood thinners per patient  Pt denies issues with constipation   

## 2015-07-06 NOTE — Patient Instructions (Signed)
YOU HAD AN ENDOSCOPIC PROCEDURE TODAY AT [facility]:   Refer to the procedure report that was given to you for any specific questions about what was found during the examination.  If the procedure report does not answer your questions, please call your gastroenterologist to clarify.  If you requested that your care partner not be given the details of your procedure findings, then the procedure report has been included in a sealed envelope for you to review at your convenience later.  YOU SHOULD EXPECT: Some feelings of bloating in the abdomen. Passage of more gas than usual.  Walking can help get rid of the air that was put into your GI tract during the procedure and reduce the bloating. If you had a lower endoscopy (such as a colonoscopy or flexible sigmoidoscopy) you may notice spotting of blood in your stool or on the toilet paper. If you underwent a bowel prep for your procedure, you may not have a normal bowel movement for a few days.  Please Note:  You might notice some irritation and congestion in your nose or some drainage.  This is from the oxygen used during your procedure.  There is no need for concern and it should clear up in a day or so.  SYMPTOMS TO REPORT IMMEDIATELY:   Following lower endoscopy (colonoscopy or flexible sigmoidoscopy):  Excessive amounts of blood in the stool  Significant tenderness or worsening of abdominal pains  Swelling of the abdomen that is new, acute  Fever of 100F or higher   For urgent or emergent issues, a gastroenterologist can be reached at any hour by calling [phone].   DIET: Your first meal following the procedure should be a small meal and then it is ok to progress to your normal diet. Heavy or fried foods are harder to digest and may make you feel nauseous or bloated.  Likewise, meals heavy in dairy and vegetables can increase bloating.  Drink plenty of fluids but you should avoid alcoholic beverages for 24  hours.  ACTIVITY:  You should plan to take it easy for the rest of today and you should NOT DRIVE or use heavy machinery until tomorrow (because of the sedation medicines used during the test).    FOLLOW UP: Our staff will call the number listed on your records the next business day following your procedure to check on you and address any questions or concerns that you may have regarding the information given to you following your procedure. If we do not reach you, we will leave a message.  However, if you are feeling well and you are not experiencing any problems, there is no need to return our call.  We will assume that you have returned to your regular daily activities without incident.  If any biopsies were taken you will be contacted by phone or by letter within the next 1-3 weeks.  Please call us at [phone] if you have not heard about the biopsies in 3 weeks.    SIGNATURES/CONFIDENTIALITY: You and/or your care partner have signed paperwork which will be entered into your electronic medical record.  These signatures attest to the fact that that the information above on your After Visit Summary has been reviewed and is understood.  Full responsibility of the confidentiality of this discharge information lies with you and/or your care-partner.   Resume medications. Information given on polyps,diverticulosis,hemorrhoids and high fiber diet.

## 2015-07-06 NOTE — Op Note (Signed)
[facility] Patient Name: Renee [name] Procedure Date: 07/06/2015 9:18 AM MRN: [id number] Endoscopist: Jerene Bears , MD Age: 75 Referring MD:  Date of Birth: 1940/11/27 Gender: Female Procedure:                Colonoscopy Indications:              Surveillance: Personal history of adenomatous                            polyps on last colonoscopy 5 years ago Medicines:                Monitored Anesthesia Care Procedure:                Pre-Anesthesia Assessment:                           - Prior to the procedure, a History and Physical                            was performed, and patient medications and                            allergies were reviewed. The patient's tolerance of                            previous anesthesia was also reviewed. The risks                            and benefits of the procedure and the sedation                            options and risks were discussed with the patient.                            All questions were answered, and informed consent                            was obtained. Prior Anticoagulants: The patient has                            taken no previous anticoagulant or antiplatelet                            agents. ASA Grade Assessment: III - A patient with                            severe systemic disease. After reviewing the risks                            and benefits, the patient was deemed in                            satisfactory condition to undergo the procedure.  After obtaining informed consent, the colonoscope                            was passed under direct vision. Throughout the                            procedure, the patient's blood pressure, pulse, and                            oxygen saturations were monitored continuously. The                            Model PCF-H190DL [id number]) scope was introduced                            through the anus and advanced to the the  cecum,                            identified by appendiceal orifice and ileocecal                            valve. The colonoscopy was performed without                            difficulty. The patient tolerated the procedure                            well. The quality of the bowel preparation was                            good. The ileocecal valve, appendiceal orifice, and                            rectum were photographed. Scope In: 9:25:03 AM Scope Out: 9:47:00 AM Scope Withdrawal Time: 0 hours 16 minutes 1 second  Total Procedure Duration: 0 hours 21 minutes 57 seconds  Findings:                 The digital rectal exam was normal.                           Two sessile polyps were found in the cecum. The                            polyps were 2 to 6 mm in size. These polyps were                            removed with a cold snare. Resection and retrieval                            were complete.                           A 2 mm polyp was found in the ascending colon. The  polyp was sessile. The polyp was removed with a                            cold biopsy forceps. Resection and retrieval were                            complete.                           A 6 mm polyp was found in the hepatic flexure. The                            polyp was flat. The polyp was removed with a cold                            snare. Resection and retrieval were complete.                           A 3 mm polyp was found in the descending colon. The                            polyp was sessile. The polyp was removed with a                            cold snare. Resection and retrieval were complete.                           Multiple small and large-mouthed diverticula were                            found in the sigmoid colon.                           Internal hemorrhoids were found during                            retroflexion. The hemorrhoids were  medium-sized. Complications:            No immediate complications. Estimated Blood Loss:     Estimated blood loss was minimal. Impression:               - Two 2 to 6 mm polyps in the cecum, removed with a                            cold snare. Resected and retrieved.                           - One 2 mm polyp in the ascending colon, removed                            with a cold biopsy forceps. Resected and retrieved.                           - One 6  mm polyp at the hepatic flexure, removed                            with a cold snare. Resected and retrieved.                           - One 3 mm polyp in the descending colon, removed                            with a cold snare. Resected and retrieved.                           - Severe diverticulosis in the sigmoid colon.                           - Internal hemorrhoids. Recommendation:           - Patient has a contact number available for                            emergencies. The signs and symptoms of potential                            delayed complications were discussed with the                            patient. Return to normal activities tomorrow.                            Written discharge instructions were provided to the                            patient.                           - Resume previous diet.                           - Continue present medications.                           - Await pathology results.                           - Repeat colonoscopy is recommended for adenoma                            surveillance. The colonoscopy date will be                            determined after pathology results from today's                            exam become available for review. Jerene Bears, MD 07/06/2015 9:52:59 AM This report has been signed electronically.

## 2015-07-06 NOTE — Progress Notes (Signed)
Called to room to assist during endoscopic procedure.  Patient ID and intended procedure confirmed with present staff. Received instructions for my participation in the procedure from the performing physician.  

## 2015-07-06 NOTE — Progress Notes (Signed)
To PACU  Awake and alert.  Report to RN 

## 2015-07-07 ENCOUNTER — Telehealth: Payer: Self-pay

## 2015-07-07 ENCOUNTER — Encounter: Payer: Self-pay | Admitting: Cardiology

## 2015-07-07 ENCOUNTER — Ambulatory Visit (INDEPENDENT_AMBULATORY_CARE_PROVIDER_SITE_OTHER): Payer: Medicare Other | Admitting: Cardiology

## 2015-07-07 VITALS — BP 112/56 | HR 59 | Ht 65.0 in | Wt 161.4 lb

## 2015-07-07 DIAGNOSIS — I6523 Occlusion and stenosis of bilateral carotid arteries: Secondary | ICD-10-CM

## 2015-07-07 DIAGNOSIS — Z889 Allergy status to unspecified drugs, medicaments and biological substances status: Secondary | ICD-10-CM

## 2015-07-07 DIAGNOSIS — I209 Angina pectoris, unspecified: Secondary | ICD-10-CM

## 2015-07-07 DIAGNOSIS — I10 Essential (primary) hypertension: Secondary | ICD-10-CM | POA: Diagnosis not present

## 2015-07-07 DIAGNOSIS — E785 Hyperlipidemia, unspecified: Secondary | ICD-10-CM

## 2015-07-07 DIAGNOSIS — R000 Tachycardia, unspecified: Secondary | ICD-10-CM

## 2015-07-07 DIAGNOSIS — R072 Precordial pain: Secondary | ICD-10-CM | POA: Diagnosis not present

## 2015-07-07 DIAGNOSIS — Z789 Other specified health status: Secondary | ICD-10-CM

## 2015-07-07 DIAGNOSIS — R0989 Other specified symptoms and signs involving the circulatory and respiratory systems: Secondary | ICD-10-CM

## 2015-07-07 NOTE — Telephone Encounter (Signed)
  Follow up Call-  Call back number 07/06/2015  Post procedure Call Back phone  # [phone]  Permission to leave phone message Yes     Patient questions:  Do you have a fever, pain , or abdominal swelling? No. Pain Score  0 *  Have you tolerated food without any problems? Yes.    Have you been able to return to your normal activities? Yes.    Do you have any questions about your discharge instructions: Diet   No. Medications  No. Follow up visit  No.  Do you have questions or concerns about your Care? No.  Actions: * If pain score is 4 or above: No action needed, pain <4.

## 2015-07-07 NOTE — Patient Instructions (Signed)
Medication Instructions:   Your physician recommends that you continue on your current medications as directed. Please refer to the Current Medication list given to you today.    Testing/Procedures:  Your physician has requested that you have en exercise stress myoview. For further information please visit [email or web address]. Please follow instruction sheet, as given.     Follow-Up:  3 MONTHS WITH DR [name]       If you need a refill on your cardiac medications before your next appointment, please call your pharmacy.

## 2015-07-07 NOTE — Progress Notes (Signed)
Patient ID: JOANNE SALAH, female   DOB: 04/19/1940, 75 y.o.   MRN: [id number]    Patient Name: Renee [name] Date of Encounter: 07/07/2015  Primary Care Provider:  Horatio Pel, MD Primary Cardiologist:  Ena Dawley   Patient Profile  Tachycardia  Problem List   Past Medical History  Diagnosis Date  . Blood transfusion 1948  . Hyperlipidemia   . Hypertension   . Arthritis     oa  . DVT (deep venous thrombosis) ([place]) 2002    left leg  . Diverticulosis   . Measles as child  . Mumps age 76  . COPD (chronic obstructive pulmonary disease) ([place])   . Disc disease, degenerative, cervical     trouble turing head and neck at times  . PONV (postoperative nausea and vomiting)   . Fibromyalgia   . Hx of skin cancer, basal cell   . Breast cancer ([facility]) 1994    bilateral / HX skin cancer  . Sinus tachycardia ([place])   . Cataract     both eyes  . Blood transfusion without reported diagnosis    Past Surgical History  Procedure Laterality Date  . Colonoscopy    . Polypectomy    . Partial hysterectomy    . Mastectomy      bilateral  . Knee surgery  2002    arthroscopy-bilateral  . Abdominal hysterectomy  1976    partial  . Back surgery  1976    lower  . Bilateral mastectomy with tramflap reconstruction  1994  . Knee arthroscopy Right 2008  . Achilles tendon adn 2 bone spurs removed Left 05-2012  . 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 Total knee arthroplasty Right 10/20/2013    Procedure: RIGHT TOTAL KNEE ARTHROPLASTY;  Surgeon: Gearlean Alf, MD;  Location: [facility];  Service: Orthopedics;  Laterality: Right;   Allergies  Allergies  Allergen Reactions  . Aspirin Nausea Only  . Codeine Itching    Mood changes, can take percocet  . Ibuprofen Hives    Blisters   . Lisinopril Cough   Chief complain: 1 year follow  up  HPI  75 year old female with past medical history of hypertension hyperlipidemia, who recently underwent total knee replacement and was found to be tachycardiac on multiple occasions.  The patient was seen by her primary care physician yesterday and because of the elevated d-dimer, a venous left lower extremity duplex was ordered and was negative for acute or chronic DVT. The patient is scheduled for chest CT today to rule out pulmonary embolism. The patient states that she feels significantly short of breath, simple tasks of daily living such as taking shower make her short of breath and sweats profusely. She denies any chest pain, palpitations, or syncope.  07/07/2015  - the patient is coming after 1 year, he was doing great until about a week ago when she presented to her primary care physician with retrosternal chest pain that's radiating to her back. The pain feels like pressure-like and associated with shortness of breath. The pain is not associated with exertion and she hasn't noticed any worsening with exertion. There is no associated diaphoresis dizziness or syncope.  She has been compliant to her medications. And denies palpitation or lower extremity edema no orthopnea paroxysmal nocturnal dyspnea.  She has been tried on different statins for hyperlipidemia including Crestor,  pravastatin both associated with significant muscle pain. She was advised to start using Zetia but couldn't afford it as co-pay was $300.  Home Medications  Prior to Admission medications   Medication Sig Start Date End Date Taking? Authorizing Provider  DULoxetine (CYMBALTA) 60 MG capsule Take 60 mg by mouth every evening.   Yes Historical Provider, MD  ezetimibe (ZETIA) 10 MG tablet Take 10 mg by mouth every evening.    Yes Historical Provider, MD  HYDROmorphone (DILAUDID) 2 MG tablet Take 1-2 tablets (2-4 mg total) by mouth every 3 (three) hours as needed. 10/16/12  Yes Alexzandrew Dara Lords, PA-C   losartan-hydrochlorothiazide (HYZAAR) 100-25 MG per tablet Take 0.5 tablets by mouth every morning.    Yes Historical Provider, MD  methocarbamol (ROBAXIN) 500 MG tablet Take 1 tablet (500 mg total) by mouth every 6 (six) hours as needed. 10/16/12  Yes Alexzandrew Dara Lords, PA-C  rivaroxaban (XARELTO) 10 MG TABS tablet Take 1 tablet (10 mg total) by mouth daily with breakfast. Take Xarelto for two and a half more weeks, then discontinue Xarelto. Once the patient has completed the Xarelto, they may resume the 81 mg Aspirin. 10/16/12  Yes Alexzandrew Dara Lords, PA-C  rosuvastatin (CRESTOR) 10 MG tablet Take 10 mg by mouth every evening.    Yes Historical Provider, MD    Family History  Family History  Problem Relation Age of Onset  . Heart disease Maternal Grandfather     CAD  . Alzheimer's disease Mother   . Colon cancer Neg Hx   . Colon polyps Neg Hx   . Rectal cancer Neg Hx   . Stomach cancer Neg Hx   . Alcohol abuse Father   . Sudden death Father   . Liver disease Father     fatty liver disease  . Other Father     gastritis  . Sudden death Brother     at birth  . Alcohol abuse Daughter   . COPD Daughter   . Unexplained death Daughter   . Alcohol abuse Daughter   . Healthy Brother   . Healthy Sister   . Healthy Grandchild     Social History  Social History   Social History  . Marital Status: Married    Spouse Name: N/A  . Number of Children: 2  . Years of Education: N/A   Occupational History  . RETIRED    Social History Main Topics  . Smoking status: Former Smoker -- 1.00 packs/day for 20 years    Types: Cigarettes    Quit date: 02/11/1992  . Smokeless tobacco: Never Used  . Alcohol Use: 3.0 oz/week    5 Glasses of wine per week     Comment: occasional wine  . Drug Use: No  . Sexual Activity: Not on file   Other Topics Concern  . Not on file   Social History Narrative     Review of Systems as per HPI General:  No chills, fever, night sweats or weight  changes.  Cardiovascular:  No chest pain, dyspnea on exertion, edema, orthopnea, palpitations, paroxysmal nocturnal dyspnea. Dermatological: No rash, lesions/masses Respiratory: No cough, dyspnea Urologic: No hematuria, dysuria Abdominal:   No nausea, vomiting, diarrhea, bright red blood per rectum, melena, or hematemesis Neurologic:  No visual changes, wkns, changes in mental status. All other systems reviewed and are otherwise negative except as noted above.  Physical Exam  BP 122/60, HR 60  General: Pleasant, NAD Psych: Normal affect. Neuro: Alert and oriented X 3. Moves all  extremities spontaneously. HEENT: Normal  Neck: Supple, bruit over right carotid artery, no JVD. Lungs:  Resp regular and unlabored, CTA. Heart: RRR no s3, s4, or murmurs. Abdomen: Soft, non-tender, non-distended, BS + x 4.  Extremities: No clubbing, cyanosis or edema. DP/PT/Radials 2+ and equal bilaterally.  Accessory Clinical Findings  ECG - sinus tachycardia, 114 beats per minute, incomplete right bundle branch block  LE venous Duplex  - No evidence of deep vein or superficial thrombosis involving the left lower extremity and right common femoral vein. An enlarged inguinal lymph node is noted on the left. - No evidence of Baker's cyst on the left. Other specific details can be found in the table(s) above. Prepared and Electronically Authenticated by  Elam Dutch [id number]  CT PE IMPRESSION: Negative for pulmonary embolism. No acute chest abnormalities.  Several punctate pulmonary nodules in both lungs. Nodules measure between 2-3 mm and are nonspecific. If the patient is at high risk for bronchogenic carcinoma, follow-up chest CT at 1year is recommended. If the patient is at low risk, no follow-up is needed. This recommendation follows the consensus statement: Guidelines for Management of Small Pulmonary Nodules Detected on CT Scans: A Statement from the [employer] as published in Radiology 2005; 237:395-400.  Electronically Signed By: Markus Daft M.D. On: 11/19/2012 15:52  TTE 12/03/2012 Study Conclusions  - Left ventricle: The cavity size was normal. Wall thickness was increased in a pattern of moderate LVH. Systolic function was normal. The estimated ejection fraction was in the range of 60% to 65%. - Atrial septum: No defect or patent foramen ovale was identified. - Pericardium, extracardiac: A trivial pericardial effusion was identified.   Exercise nuclear stress test: 12/24/12  Quantitative Gated Spect Images  QGS EDV: 60 ml  QGS ESV: 17 ml  Impression  Exercise Capacity: Poor exercise capacity.  BP Response: Hypertensive blood pressure response.  Clinical Symptoms: Dyspnea  ECG Impression: PACs, ventricular couplet. No ischemic ST segment changes.  Comparison with Prior Nuclear Study: No significant change from previous study  Overall Impression: Normal stress nuclear study.  LV Ejection Fraction: 72%. LV Wall Motion: NL LV Function; NL Wall Motion  Loralie Champagne  12/24/2012     Assessment & Plan  75 year old female post left knee replacement with   1. Chest pain - with some typical and some atypical features, negative stress test in 2014, her EKG shows normal sinus rhythm and normal EKG, however will schedule the exercise nuclear stress test to evaluate for ischemia.  2. Hyperlipidemia - significant with statin intolerance, we will refer to lipid clinic once we have results from the stress test. His stress test negative we will consider calcium score for risk stratification and consideration for PCSK 9 inhibitors.  3. Sinus tachycardia. Chest CT ruled out pulmonary embolism. Tachycardia has resolved with Metoprolol 25 mg po bid.  4. Hypertensive heart disease without heart failure  - blood pressure is now controlled.   5. Right carotid bruit - 40-59% B/L carotid stenosis in 2016, repeat on 07/22/15.  Follow up in  1 year.   Ena Dawley, MD 07/07/2015, 12:12 PM

## 2015-07-12 ENCOUNTER — Telehealth (HOSPITAL_COMMUNITY): Payer: Self-pay | Admitting: *Deleted

## 2015-07-12 NOTE — Telephone Encounter (Signed)
Patient given detailed instructions per Myocardial Perfusion Study Information Sheet for the test on 07/16/15. Patient notified to arrive 15 minutes early and that it is imperative to arrive on time for appointment to keep from having the test rescheduled.  If you need to cancel or reschedule your appointment, please call the office within 24 hours of your appointment. Failure to do so may result in a cancellation of your appointment, and a $50 no show fee. Patient verbalized understanding. Hubbard Robinson, RN

## 2015-07-13 ENCOUNTER — Encounter: Payer: Self-pay | Admitting: Internal Medicine

## 2015-07-16 ENCOUNTER — Encounter (HOSPITAL_COMMUNITY): Payer: Medicare Other

## 2015-07-16 ENCOUNTER — Ambulatory Visit (HOSPITAL_COMMUNITY): Payer: Medicare Other | Attending: Internal Medicine

## 2015-07-16 DIAGNOSIS — R072 Precordial pain: Secondary | ICD-10-CM | POA: Insufficient documentation

## 2015-07-16 DIAGNOSIS — R0602 Shortness of breath: Secondary | ICD-10-CM | POA: Diagnosis not present

## 2015-07-16 DIAGNOSIS — I10 Essential (primary) hypertension: Secondary | ICD-10-CM | POA: Diagnosis not present

## 2015-07-16 LAB — MYOCARDIAL PERFUSION IMAGING
Estimated workload: 4.6 METS
Exercise duration (min): 3 min
Exercise duration (sec): 0 s
LV dias vol: 45 mL (ref 46–106)
LV sys vol: 8 mL
MPHR: 146 {beats}/min
Peak HR: 155 {beats}/min
Percent HR: 106 %
RATE: 0.33
Rest HR: 74 {beats}/min
SDS: 4
SRS: 11
SSS: 15
TID: 0.81

## 2015-07-16 MED ORDER — TECHNETIUM TC 99M TETROFOSMIN IV KIT
31.1000 | PACK | Freq: Once | INTRAVENOUS | Status: AC | PRN
  Administered 2015-07-16: 31.1 via INTRAVENOUS
  Filled 2015-07-16: qty 31

## 2015-07-16 MED ORDER — TECHNETIUM TC 99M TETROFOSMIN IV KIT
11.0000 | PACK | Freq: Once | INTRAVENOUS | Status: AC | PRN
  Administered 2015-07-16: 11 via INTRAVENOUS
  Filled 2015-07-16: qty 11

## 2015-07-19 ENCOUNTER — Telehealth: Payer: Self-pay | Admitting: Cardiology

## 2015-07-19 NOTE — Telephone Encounter (Signed)
F/u  Pt returning Rn phone call- myoview results. Please call back and discuss.

## 2015-07-19 NOTE — Telephone Encounter (Signed)
PT AWARE OF MYOVIEW RESULTS./CY 

## 2015-07-22 ENCOUNTER — Ambulatory Visit (HOSPITAL_COMMUNITY)
Admission: RE | Admit: 2015-07-22 | Discharge: 2015-07-22 | Disposition: A | Discharge status: Home / Self Care | Payer: Medicare Other | Source: Ambulatory Visit | Attending: Cardiovascular Disease | Admitting: Cardiovascular Disease

## 2015-07-22 DIAGNOSIS — E785 Hyperlipidemia, unspecified: Secondary | ICD-10-CM | POA: Diagnosis not present

## 2015-07-22 DIAGNOSIS — I6523 Occlusion and stenosis of bilateral carotid arteries: Secondary | ICD-10-CM

## 2015-07-22 DIAGNOSIS — I10 Essential (primary) hypertension: Secondary | ICD-10-CM | POA: Insufficient documentation

## 2015-07-23 ENCOUNTER — Telehealth: Payer: Self-pay | Admitting: Cardiology

## 2015-07-23 ENCOUNTER — Ambulatory Visit: Payer: Medicare Other | Admitting: Cardiology

## 2015-07-23 DIAGNOSIS — I6523 Occlusion and stenosis of bilateral carotid arteries: Secondary | ICD-10-CM

## 2015-07-23 DIAGNOSIS — E785 Hyperlipidemia, unspecified: Secondary | ICD-10-CM

## 2015-07-23 DIAGNOSIS — I6529 Occlusion and stenosis of unspecified carotid artery: Secondary | ICD-10-CM | POA: Insufficient documentation

## 2015-07-23 NOTE — Telephone Encounter (Signed)
Left a message for the pt to call back to endorse Dr Francesca Oman recommendations for her to try optimal diet and recheck lipids in 3 months and decide based on that.  Informed the pt on her VM to ask to speak with a triage nurse on Monday, to make arrangements to have a lab appt made a couple days prior to her follow-up with Dr [name] on 9/25.

## 2015-07-23 NOTE — Telephone Encounter (Signed)
-----   Message from Dorothy Spark, MD sent at 07/22/2015 11:58 AM EDT ----- Stable 1-39% Right ICA stenosis. Stable 40-59% Left ICA stenosis.  Recommended repeat in 1 year  ----- Message -----    From: Provider Default, MD    Sent: 07/22/2015  10:56 AM      To: Dorothy Spark, MD

## 2015-07-23 NOTE — Telephone Encounter (Signed)
Notified the pt that per Dr [name], her carotid duplex showed stable 1-39%, right ICA stenosis, and stable 40-59%, Left ICA stenosis.  Informed the pt that Dr [name] recommends that we repeat this study in one year.  Informed the pt that I will place the order in the system and someone from scheduling will call her back to have this arranged for one year out.  Pt verbalized understanding and agrees with this plan.   Pt did however want Dr [name] to advise on whether she still wants to refer the pt to lipid clinic to see Elberta Leatherwood, for multiple statin intolerances. Informed the pt that I will route this message to Dr [name] to review and advise on lipid clinic referral and follow-up with the pt thereafter.  Pt verbalized understanding and agrees with this plan.

## 2015-07-23 NOTE — Telephone Encounter (Signed)
She should try optimal diet and we will recheck lipids in 3 months and decide based on that.

## 2015-07-23 NOTE — Telephone Encounter (Signed)
New message     Patient returning call back to nurse - ultrasound results

## 2015-07-27 NOTE — Telephone Encounter (Signed)
Notified the pt that per Dr [name], she recommends that she try optimal diet, and recheck lipids in 3 months and decide whether or not she should be referred to our lipid clinic, based on that.  Informed the pt that we could obtain her fasting lipids the week before she comes in to see Dr [name] on 9/25. Pt states she could come in for lab to check her lipids on 9/19.  Scheduled the pt a lab appt for 9/19, at our office, to check her lipids.  Advised the pt to come fasting to this lab appt.  Provided pt education on heart healthy and optimal dieting, to improve lipids.  Pt verbalized understanding and agrees with this plan.

## 2015-08-16 DIAGNOSIS — E7800 Pure hypercholesterolemia, unspecified: Secondary | ICD-10-CM | POA: Diagnosis not present

## 2015-09-22 ENCOUNTER — Other Ambulatory Visit: Payer: Self-pay | Admitting: Cardiology

## 2015-09-28 DIAGNOSIS — N390 Urinary tract infection, site not specified: Secondary | ICD-10-CM | POA: Diagnosis not present

## 2015-10-11 ENCOUNTER — Encounter: Payer: Self-pay | Admitting: *Deleted

## 2015-10-13 DIAGNOSIS — L821 Other seborrheic keratosis: Secondary | ICD-10-CM | POA: Diagnosis not present

## 2015-10-13 DIAGNOSIS — D225 Melanocytic nevi of trunk: Secondary | ICD-10-CM | POA: Diagnosis not present

## 2015-10-13 DIAGNOSIS — L918 Other hypertrophic disorders of the skin: Secondary | ICD-10-CM | POA: Diagnosis not present

## 2015-10-13 DIAGNOSIS — L570 Actinic keratosis: Secondary | ICD-10-CM | POA: Diagnosis not present

## 2015-10-13 DIAGNOSIS — L814 Other melanin hyperpigmentation: Secondary | ICD-10-CM | POA: Diagnosis not present

## 2015-10-19 ENCOUNTER — Other Ambulatory Visit: Payer: Medicare Other | Admitting: *Deleted

## 2015-10-19 DIAGNOSIS — E785 Hyperlipidemia, unspecified: Secondary | ICD-10-CM | POA: Diagnosis not present

## 2015-10-19 DIAGNOSIS — I6523 Occlusion and stenosis of bilateral carotid arteries: Secondary | ICD-10-CM | POA: Diagnosis not present

## 2015-10-19 LAB — LIPID PANEL
Cholesterol: 293 mg/dL — ABNORMAL HIGH (ref 125–200)
HDL: 55 mg/dL (ref >=46)
LDL Cholesterol: 194 mg/dL — ABNORMAL HIGH (ref <130)
Total CHOL/HDL Ratio: 5.3 Ratio — ABNORMAL HIGH (ref <=5.0)
Triglycerides: 218 mg/dL — ABNORMAL HIGH (ref <150)
VLDL: 44 mg/dL — ABNORMAL HIGH (ref <30)

## 2015-10-21 ENCOUNTER — Telehealth: Payer: Self-pay | Admitting: Cardiology

## 2015-10-21 NOTE — Telephone Encounter (Signed)
error 

## 2015-10-25 ENCOUNTER — Ambulatory Visit (INDEPENDENT_AMBULATORY_CARE_PROVIDER_SITE_OTHER): Payer: Medicare Other | Admitting: Pharmacist

## 2015-10-25 ENCOUNTER — Ambulatory Visit (INDEPENDENT_AMBULATORY_CARE_PROVIDER_SITE_OTHER): Payer: Medicare Other | Admitting: Cardiology

## 2015-10-25 VITALS — BP 124/72 | HR 66 | Ht 65.0 in | Wt 161.0 lb

## 2015-10-25 DIAGNOSIS — I2510 Atherosclerotic heart disease of native coronary artery without angina pectoris: Secondary | ICD-10-CM

## 2015-10-25 DIAGNOSIS — E785 Hyperlipidemia, unspecified: Secondary | ICD-10-CM

## 2015-10-25 DIAGNOSIS — Z889 Allergy status to unspecified drugs, medicaments and biological substances status: Secondary | ICD-10-CM

## 2015-10-25 DIAGNOSIS — R072 Precordial pain: Secondary | ICD-10-CM | POA: Diagnosis not present

## 2015-10-25 DIAGNOSIS — Z789 Other specified health status: Secondary | ICD-10-CM

## 2015-10-25 DIAGNOSIS — I6523 Occlusion and stenosis of bilateral carotid arteries: Secondary | ICD-10-CM

## 2015-10-25 NOTE — Progress Notes (Addendum)
Patient ID: Renee [name]                 DOB: Jan 26, 1941                    MRN: [id number]     HPI: Renee [name] is a 75 y.o. female patient referred to lipid clinic by Dr. [name]. PMH is significant for significant coronary calcification on chest CT suggestive of CAD, dyspnea on exertion, HTN, HLD, DVT, and COPD. Carotid duplex in June 2017 showed stable 1-39% right ICA stenosis and stable 40-59% left ICA stenosis. Patient has a history of multiple statin intolerances and presents today for further lipid management.  Pt is intolerant to atorvastatin, pravastatin, rosuvastatin, and Zetia. She experienced myalgias within 3-4 weeks of statin use. Symptoms would go away within 3-4 days of drug discontinuation. Symptoms were reproducible with rechallenge. She is interested in PCSK9i.  Pt has also been enrolled in the CLEAR study (with bempedoic acid) for 1 month with Dr Einar Gip. She has had her cholesterol checked since then and brings in reports today. LDL elevated to 194. Will need to double check that it is ok to add PCSK9i while it is in this study.  Current Medications: none Intolerances: atorvastatin '40mg'$  daily, pravastatin 20 and '40mg'$  daily, rosuvastatin '5mg'$  and '10mg'$  daily, Zetia '10mg'$  daily Risk Factors: CAD, HTN LDL goal: '70mg'$ /dL  Diet: Breakfast - cereal. Lunch - sandwich. Dinner - vegetables and meat. Drinks a lot of sweet tea every day - 1/2 gallon sweet tea per day.   Exercise: Exercise stationary bike - 10-15 minutes per day.  Family History: Mother with Alzheimer's disease, father with alcohol abuse, fatty liver disease, gastritis, and sudden death. Only cardiac history is in maternal grandfather - heart disease and CAD.  Social History: Former smoker - 1 PPD for 20 years. Quit 02/11/1992. Does not drink alcohol, denies illicit drug use.  Labs: 10/19/2015: TC 293, TG 218, HDL 55, LDL 194 (on no lipid lowering therapy)  Past Medical History:  Diagnosis Date  . Arthritis    oa  . Blood transfusion 1948  . Blood transfusion without reported diagnosis   . Breast cancer ([place]) 1994   bilateral / HX skin cancer  . Cataract    both eyes  . COPD (chronic obstructive pulmonary disease) ([place])   . Disc disease, degenerative, cervical    trouble turing head and neck at times  . Diverticulosis   . DVT (deep venous thrombosis) ([place]) 2002   left leg  . Fibromyalgia   . Hx of skin cancer, basal cell   . Hyperlipidemia   . Hypertension   . Measles as child  . Mumps age 54  . PONV (postoperative nausea and vomiting)   . Sinus tachycardia ([place])     Current Outpatient Prescriptions on File Prior to Visit  Medication Sig Dispense Refill  . aspirin 81 MG tablet Take 81 mg by mouth at bedtime.    . cholecalciferol (VITAMIN D) 1000 units tablet Take 1,000 Units by mouth daily.    Marland Kitchen HYDROcodone-acetaminophen (NORCO/VICODIN) 5-325 MG tablet Take 0.5 tablets by mouth as needed for moderate pain.   0  . losartan-hydrochlorothiazide (HYZAAR) 100-25 MG per tablet Take 0.5 tablets by mouth every morning.     . Magnesium 250 MG TABS Take 1 tablet by mouth daily.    . metoprolol (LOPRESSOR) 50 MG tablet take 1 tablet by mouth twice a day 180 tablet 2  . Multiple Vitamin (  MULTIVITAMIN) tablet Take 1 tablet by mouth daily.    . Probiotic Product (PROBIOTIC ADVANCED PO) Take 1 capsule by mouth daily.    . vitamin E 400 UNIT capsule Take 400 Units by mouth daily.     No current facility-administered medications on file prior to visit.     Allergies  Allergen Reactions  . Aspirin Nausea Only  . Codeine Itching    Mood changes, can take percocet  . Ibuprofen Hives    Blisters   . Lisinopril Cough    Assessment/Plan:  1. Hyperlipidemia - LDL '194mg'$ /dL far above goal < '70mg'$ /dL given history of CAD. Pt intolerant to 3 statins including lowest starting dose + Zetia. Only other option to bring LDL to goal is PCKS9i. Discussed expected benefit, injection technique, and side  effects of Praluent therapy. Prior authorization submitted for Praluent coverage. Pt will call back with CLEAR study contact so we can double check that it's ok to make changes to lipid lowering therapy while pt in study.   Kellen Hover E. Supple, PharmD, [place] [address] [place], [place] [address] Phone: [phone]; Fax: [phone] 10/25/2015 7:41 AM    Addendum: Damaris Schooner with contact Barbarann Ehlers for CLEAR study. She stated that pt cannot have any lipid medication adjustments while she is in the trial. Pt is still interested in seeing whether or not her insurance will cover Praluent. She would prefer Praluent therapy rather than the CLEAR trial if given an option. Will contact her once we hear back from her insurance company with a decision.

## 2015-10-25 NOTE — Progress Notes (Signed)
Patient ID: TEREZ MONTEE, female   DOB: 02-18-1940, 75 y.o.   MRN: [id number]    Patient Name: Renee [name] Date of Encounter: 10/25/2015  Primary Care Provider:  Horatio Pel, MD Primary Cardiologist:  Ena Dawley   Patient Profile  Tachycardia  Problem List   Past Medical History:  Diagnosis Date  . Arthritis    oa  . Blood transfusion 1948  . Blood transfusion without reported diagnosis   . Breast cancer ([place]) 1994   bilateral / HX skin cancer  . Cataract    both eyes  . COPD (chronic obstructive pulmonary disease) ([place])   . Disc disease, degenerative, cervical    trouble turing head and neck at times  . Diverticulosis   . DVT (deep venous thrombosis) ([place]) 2002   left leg  . Fibromyalgia   . Hx of skin cancer, basal cell   . Hyperlipidemia   . Hypertension   . Measles as child  . Mumps age 32  . PONV (postoperative nausea and vomiting)   . Sinus tachycardia ([place])    Past Surgical History:  Procedure Laterality Date  . ABDOMINAL HYSTERECTOMY  1976   partial  . achilles tendon adn 2 bone spurs removed Left 05-2012  . [place]   lower  . bilateral mastectomy with tramflap reconstruction  1994  . COLONOSCOPY    . KNEE ARTHROSCOPY Right 2008  . KNEE SURGERY  2002   arthroscopy-bilateral  . MASTECTOMY     bilateral  . PARTIAL HYSTERECTOMY    . POLYPECTOMY    . 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 TOTAL KNEE ARTHROPLASTY Right 10/20/2013   Procedure: RIGHT TOTAL KNEE ARTHROPLASTY;  Surgeon: Gearlean Alf, MD;  Location: [facility];  Service: Orthopedics;  Laterality: Right;   Allergies  Allergies  Allergen Reactions  . Aspirin Nausea Only  . Codeine Itching    Mood changes, can take percocet  . Ibuprofen Hives    Blisters   . Lisinopril Cough   Chief complain: 1 year follow  up  HPI  75 year old female with past medical history of hypertension hyperlipidemia, who recently underwent total knee replacement and was found to be tachycardiac on multiple occasions.  The patient was seen by her primary care physician yesterday and because of the elevated d-dimer, a venous left lower extremity duplex was ordered and was negative for acute or chronic DVT. The patient is scheduled for chest CT today to rule out pulmonary embolism. The patient states that she feels significantly short of breath, simple tasks of daily living such as taking shower make her short of breath and sweats profusely. She denies any chest pain, palpitations, or syncope.  07/07/2015  - the patient is coming after 1 year, he was doing great until about a week ago when she presented to her primary care physician with retrosternal chest pain that's radiating to her back. The pain feels like pressure-like and associated with shortness of breath. The pain is not associated with exertion and she hasn't noticed any worsening with exertion. There is no associated diaphoresis dizziness or syncope.  She has been compliant to her medications. And denies palpitation or lower extremity edema no orthopnea paroxysmal nocturnal dyspnea.  She has been tried on different statins for hyperlipidemia including Crestor, pravastatin both associated with significant muscle pain. She was advised to  start using Zetia but couldn't afford it as co-pay was $300.  10/25/2015 the patient is coming after 3 months, she underwent exercise nuclear stress testing is negative for ischemia and showed normal LVEF however she had hypertensive response to exertion. Today she took that stress test she didn't take her blood pressure medication. She overall feels well she has no shortness of breath or chest pain no lower extremity edema orthopnea or proximal nocturnal dyspnea. She denies palpitations or syncope. She has been compliant to her meds.  Home  Medications  Prior to Admission medications   Medication Sig Start Date End Date Taking? Authorizing Provider  DULoxetine (CYMBALTA) 60 MG capsule Take 60 mg by mouth every evening.   Yes Historical Provider, MD  ezetimibe (ZETIA) 10 MG tablet Take 10 mg by mouth every evening.    Yes Historical Provider, MD  HYDROmorphone (DILAUDID) 2 MG tablet Take 1-2 tablets (2-4 mg total) by mouth every 3 (three) hours as needed. 10/16/12  Yes Alexzandrew Dara Lords, PA-C  losartan-hydrochlorothiazide (HYZAAR) 100-25 MG per tablet Take 0.5 tablets by mouth every morning.    Yes Historical Provider, MD  methocarbamol (ROBAXIN) 500 MG tablet Take 1 tablet (500 mg total) by mouth every 6 (six) hours as needed. 10/16/12  Yes Alexzandrew Dara Lords, PA-C  rivaroxaban (XARELTO) 10 MG TABS tablet Take 1 tablet (10 mg total) by mouth daily with breakfast. Take Xarelto for two and a half more weeks, then discontinue Xarelto. Once the patient has completed the Xarelto, they may resume the 81 mg Aspirin. 10/16/12  Yes Alexzandrew Dara Lords, PA-C  rosuvastatin (CRESTOR) 10 MG tablet Take 10 mg by mouth every evening.    Yes Historical Provider, MD    Family History  Family History  Problem Relation Age of Onset  . Alzheimer's disease Mother   . Alcohol abuse Father   . Sudden death Father   . Liver disease Father     fatty liver disease  . Other Father     gastritis  . Healthy Brother   . Alcohol abuse Daughter   . COPD Daughter   . Healthy Grandchild   . Heart disease Maternal Grandfather   . CAD Maternal Grandfather   . Sudden death Brother     at birth  . Unexplained death Daughter   . Alcohol abuse Daughter   . Healthy Sister   . Colon cancer Neg Hx   . Colon polyps Neg Hx   . Rectal cancer Neg Hx   . Stomach cancer Neg Hx     Social History  Social History   Social History  . Marital status: Married    Spouse name: N/A  . Number of children: 2  . Years of education: N/A   Occupational History   . RETIRED Retired   Social History Main Topics  . Smoking status: Former Smoker    Packs/day: 1.00    Years: 20.00    Types: Cigarettes    Quit date: 02/11/1992  . Smokeless tobacco: Never Used  . Alcohol use 3.0 oz/week    5 Glasses of wine per week     Comment: occasional wine  . Drug use: No  . Sexual activity: Not on file   Other Topics Concern  . Not on file   Social History Narrative  . No narrative on file     Review of Systems as per HPI General:  No chills, fever, night sweats or weight changes.  Cardiovascular:  No chest pain, dyspnea on exertion,  edema, orthopnea, palpitations, paroxysmal nocturnal dyspnea. Dermatological: No rash, lesions/masses Respiratory: No cough, dyspnea Urologic: No hematuria, dysuria Abdominal:   No nausea, vomiting, diarrhea, bright red blood per rectum, melena, or hematemesis Neurologic:  No visual changes, wkns, changes in mental status. All other systems reviewed and are otherwise negative except as noted above.  Physical Exam  BP 122/60, HR 60  General: Pleasant, NAD Psych: Normal affect. Neuro: Alert and oriented X 3. Moves all extremities spontaneously. HEENT: Normal  Neck: Supple, bruit over right carotid artery, no JVD. Lungs:  Resp regular and unlabored, CTA. Heart: RRR no s3, s4, or murmurs. Abdomen: Soft, non-tender, non-distended, BS + x 4.  Extremities: No clubbing, cyanosis or edema. DP/PT/Radials 2+ and equal bilaterally.  Accessory Clinical Findings  ECG - sinus tachycardia, 114 beats per minute, incomplete right bundle branch block  LE venous Duplex  - No evidence of deep vein or superficial thrombosis involving the left lower extremity and right common femoral vein. An enlarged inguinal lymph node is noted on the left. - No evidence of Baker's cyst on the left. Other specific details can be found in the table(s) above. Prepared and Electronically Authenticated by  Elam Dutch 2014-10-20T19:[id number]  CT PE IMPRESSION: Negative for pulmonary embolism. No acute chest abnormalities.  Several punctate pulmonary nodules in both lungs. Nodules measure between 2-3 mm and are nonspecific. If the patient is at high risk for bronchogenic carcinoma, follow-up chest CT at 1year is recommended. If the patient is at low risk, no follow-up is needed. This recommendation follows the consensus statement: Guidelines for Management of Small Pulmonary Nodules Detected on CT Scans: A Statement from the [place] as published in Radiology 2005; 237:395-400.  Electronically Signed By: Markus Daft M.D. On: 11/19/2012 15:52  TTE 12/03/2012 Study Conclusions  - Left ventricle: The cavity size was normal. Wall thickness was increased in a pattern of moderate LVH. Systolic function was normal. The estimated ejection fraction was in the range of 60% to 65%. - Atrial septum: No defect or patent foramen ovale was identified. - Pericardium, extracardiac: A trivial pericardial effusion was identified.   Exercise nuclear stress test: 12/24/12  Quantitative Gated Spect Images  QGS EDV: 60 ml  QGS ESV: 17 ml  Impression  Exercise Capacity: Poor exercise capacity.  BP Response: Hypertensive blood pressure response.  Clinical Symptoms: Dyspnea  ECG Impression: PACs, ventricular couplet. No ischemic ST segment changes.  Comparison with Prior Nuclear Study: No significant change from previous study  Overall Impression: Normal stress nuclear study.  LV Ejection Fraction: 72%. LV Wall Motion: NL LV Function; NL Wall Motion  Loralie Champagne  12/24/2012  Exercise nuclear stress test: 07/2014  Nuclear stress EF: 83%.  Blood pressure demonstrated a hypertensive response to exercise.  Horizontal ST segment depression ST segment depression was noted during stress in the II, III, aVF, V4, V5 and V6 leads.  This is a low risk study.  The left ventricular ejection fraction  is hyperdynamic (>65%).   Poor exercise tolerance achieving less than 5 METS with HTN respnse Positive ECG Perfusion images with breast attenuation no ischemia or infarct EF 83%     Assessment & Plan  75 year old female post left knee replacement with   1. Chest pain - with some typical and some atypical features, negative stress test in 2014, anion July 2017 her EKG shows normal sinus rhythm and normal EKG, he had hypertensive response to exertion up to 218/92 however she didn't take blood pressure medication today  of her test, her blood pressure today 124/70 , I would continue the same medication.   2. Hyperlipidemia - significant with statin intolerance, she has been enrolled by Dr. Einar Gip to lipid management study, however after 5 weeks she complains of headaches and her LDL is 194. I have personally reviewed her chest CT that was done in January of this year and she has diffuse calcifications in all 3 coronary arteries. She definitely needs to be considered for PC SK 9 inhibitors and she will follow with [facility].  3. Sinus tachycardia. Chest CT ruled out pulmonary embolism. Tachycardia has resolved with Metoprolol 50 mg po bid.  4. Hypertensive heart disease without heart failure  - blood pressure is now controlled.   5. Right carotid bruit - her carotid duplex showed stable 1-39%, right ICA stenosis, and stable 40-59%, Left ICA stenosis. Repeat in 1 year.  Follow up in 1 year.   Ena Dawley, MD 10/25/2015, 10:30 AM

## 2015-10-25 NOTE — Patient Instructions (Signed)

## 2015-10-25 NOTE — Patient Instructions (Addendum)
Cut back on your sweet tea - you can use a sugar substitute like Splenda or Stevia instead. This will help to lower your triglycerides.  I will start the paperwork for Praluent injections.  Call Rekha Hobbins in lipid clinic with any questions [phone].

## 2015-10-29 ENCOUNTER — Telehealth: Payer: Self-pay | Admitting: Pharmacist

## 2015-10-29 NOTE — Telephone Encounter (Signed)
Patient LM to report that copay of Praluent is cost-prohibitive for her and requesting that Jinny Blossom Supple not pursue further since she will not be able to get medication anyway.   Will forward to [facility] as FYI.

## 2015-11-01 NOTE — Telephone Encounter (Signed)
Noted. Initial PA was denied as well.  Had also contacted the study that pt is enrolled in and no other lipid medication changes are to be made while pt enrolled in study.

## 2015-11-10 DIAGNOSIS — H5203 Hypermetropia, bilateral: Secondary | ICD-10-CM | POA: Diagnosis not present

## 2015-11-10 DIAGNOSIS — H52223 Regular astigmatism, bilateral: Secondary | ICD-10-CM | POA: Diagnosis not present

## 2015-11-10 DIAGNOSIS — H2513 Age-related nuclear cataract, bilateral: Secondary | ICD-10-CM | POA: Diagnosis not present

## 2015-11-10 DIAGNOSIS — H43393 Other vitreous opacities, bilateral: Secondary | ICD-10-CM | POA: Diagnosis not present

## 2015-11-10 DIAGNOSIS — H524 Presbyopia: Secondary | ICD-10-CM | POA: Diagnosis not present

## 2015-12-15 DIAGNOSIS — Z6828 Body mass index (BMI) 28.0-28.9, adult: Secondary | ICD-10-CM | POA: Diagnosis not present

## 2015-12-15 DIAGNOSIS — Z124 Encounter for screening for malignant neoplasm of cervix: Secondary | ICD-10-CM | POA: Diagnosis not present

## 2016-01-03 DIAGNOSIS — R102 Pelvic and perineal pain: Secondary | ICD-10-CM | POA: Diagnosis not present

## 2016-01-03 DIAGNOSIS — R87615 Unsatisfactory cytologic smear of cervix: Secondary | ICD-10-CM | POA: Diagnosis not present

## 2016-01-03 DIAGNOSIS — M25552 Pain in left hip: Secondary | ICD-10-CM | POA: Diagnosis not present

## 2016-01-03 DIAGNOSIS — M25551 Pain in right hip: Secondary | ICD-10-CM | POA: Diagnosis not present

## 2016-01-07 DIAGNOSIS — Z0000 Encounter for general adult medical examination without abnormal findings: Secondary | ICD-10-CM | POA: Diagnosis not present

## 2016-01-07 DIAGNOSIS — M8580 Other specified disorders of bone density and structure, unspecified site: Secondary | ICD-10-CM | POA: Diagnosis not present

## 2016-01-07 DIAGNOSIS — E118 Type 2 diabetes mellitus with unspecified complications: Secondary | ICD-10-CM | POA: Diagnosis not present

## 2016-01-07 DIAGNOSIS — E559 Vitamin D deficiency, unspecified: Secondary | ICD-10-CM | POA: Diagnosis not present

## 2016-01-07 DIAGNOSIS — I10 Essential (primary) hypertension: Secondary | ICD-10-CM | POA: Diagnosis not present

## 2016-01-07 DIAGNOSIS — K219 Gastro-esophageal reflux disease without esophagitis: Secondary | ICD-10-CM | POA: Diagnosis not present

## 2016-01-07 DIAGNOSIS — Z7982 Long term (current) use of aspirin: Secondary | ICD-10-CM | POA: Diagnosis not present

## 2016-01-07 DIAGNOSIS — N390 Urinary tract infection, site not specified: Secondary | ICD-10-CM | POA: Diagnosis not present

## 2016-01-12 DIAGNOSIS — R1013 Epigastric pain: Secondary | ICD-10-CM | POA: Diagnosis not present

## 2016-01-12 DIAGNOSIS — Z853 Personal history of malignant neoplasm of breast: Secondary | ICD-10-CM | POA: Diagnosis not present

## 2016-01-12 DIAGNOSIS — Z86718 Personal history of other venous thrombosis and embolism: Secondary | ICD-10-CM | POA: Diagnosis not present

## 2016-01-12 DIAGNOSIS — M1990 Unspecified osteoarthritis, unspecified site: Secondary | ICD-10-CM | POA: Diagnosis not present

## 2016-01-13 DIAGNOSIS — M1611 Unilateral primary osteoarthritis, right hip: Secondary | ICD-10-CM | POA: Diagnosis not present

## 2016-01-20 ENCOUNTER — Ambulatory Visit: Payer: Medicare Other | Admitting: Gastroenterology

## 2016-02-10 DIAGNOSIS — M1612 Unilateral primary osteoarthritis, left hip: Secondary | ICD-10-CM | POA: Diagnosis not present

## 2016-03-15 ENCOUNTER — Telehealth: Payer: Self-pay | Admitting: Cardiology

## 2016-03-15 NOTE — Telephone Encounter (Signed)
Call transferred into triage; patient reports she took all of her medicines all at once and realized afterward there were two pink ones. --took 2 lopressor 50 mg tablets.  Also takes Hyzaar 100-25 mg--takes a whole pill since last seeing Dr. [name] she states she was told to increase to whole pill but her medicine list reflects 1/2 tablet.   Pt was able to check her HR = 80.  She has a BP cuff and is able to listen to her BP but it takes her a long time and so she didn't want to do that on the phone.  I advised her to drink extra fluids today and skip the evening dose of lopressor.  Advised that if BP and HR decrease from extra dose she may feel fatigue, lightheaded or dizziness and so she should make slow position changes.   Pt is aware I am forwarding to Dr. [name] and her nurse and if there are any new recommendations we will call her back.

## 2016-03-15 NOTE — Telephone Encounter (Signed)
Its ok, monitor BP and HR. Continue regular medications starting tomorrow

## 2016-03-15 NOTE — Telephone Encounter (Signed)
Notified the pt of Dr Francesca Oman recommendations and provided reassurance.  Advised the pt to start taking her regular regimen starting tomorrow.  Pt verbalized understanding and agrees with this plan.  Pt states she feels much better and gracious for all the follow-up provided.

## 2016-03-15 NOTE — Telephone Encounter (Signed)
New message      Pt c/o medication issue:  1. Name of Medication: metoprolol 2. How are you currently taking this medication (dosage and times per day)? '50mg'$  3. Are you having a reaction (difficulty breathing--STAT)? no 4. What is your medication issue? Pt thinks she took 2 pills this am instead of one.

## 2016-03-20 ENCOUNTER — Ambulatory Visit: Payer: Medicare Other | Admitting: Physician Assistant

## 2016-05-03 ENCOUNTER — Ambulatory Visit: Payer: Medicare Other | Admitting: Cardiology

## 2016-05-12 DIAGNOSIS — E7800 Pure hypercholesterolemia, unspecified: Secondary | ICD-10-CM | POA: Diagnosis not present

## 2016-05-12 DIAGNOSIS — E118 Type 2 diabetes mellitus with unspecified complications: Secondary | ICD-10-CM | POA: Diagnosis not present

## 2016-05-12 DIAGNOSIS — I10 Essential (primary) hypertension: Secondary | ICD-10-CM | POA: Diagnosis not present

## 2016-05-16 DIAGNOSIS — M542 Cervicalgia: Secondary | ICD-10-CM | POA: Diagnosis not present

## 2016-05-16 DIAGNOSIS — G5603 Carpal tunnel syndrome, bilateral upper limbs: Secondary | ICD-10-CM | POA: Diagnosis not present

## 2016-05-16 DIAGNOSIS — E7800 Pure hypercholesterolemia, unspecified: Secondary | ICD-10-CM | POA: Diagnosis not present

## 2016-05-26 DIAGNOSIS — Z96651 Presence of right artificial knee joint: Secondary | ICD-10-CM | POA: Diagnosis not present

## 2016-05-26 DIAGNOSIS — Z471 Aftercare following joint replacement surgery: Secondary | ICD-10-CM | POA: Diagnosis not present

## 2016-06-21 DIAGNOSIS — L710 Perioral dermatitis: Secondary | ICD-10-CM | POA: Diagnosis not present

## 2016-06-23 DIAGNOSIS — E7800 Pure hypercholesterolemia, unspecified: Secondary | ICD-10-CM | POA: Diagnosis not present

## 2016-06-23 DIAGNOSIS — I10 Essential (primary) hypertension: Secondary | ICD-10-CM | POA: Diagnosis not present

## 2016-06-27 DIAGNOSIS — I10 Essential (primary) hypertension: Secondary | ICD-10-CM | POA: Diagnosis not present

## 2016-06-27 DIAGNOSIS — E118 Type 2 diabetes mellitus with unspecified complications: Secondary | ICD-10-CM | POA: Diagnosis not present

## 2016-06-27 DIAGNOSIS — M791 Myalgia: Secondary | ICD-10-CM | POA: Diagnosis not present

## 2016-06-27 DIAGNOSIS — E7800 Pure hypercholesterolemia, unspecified: Secondary | ICD-10-CM | POA: Diagnosis not present

## 2016-06-30 ENCOUNTER — Other Ambulatory Visit: Payer: Self-pay | Admitting: Cardiology

## 2016-07-14 ENCOUNTER — Encounter: Payer: Self-pay | Admitting: Cardiology

## 2016-07-14 ENCOUNTER — Ambulatory Visit (INDEPENDENT_AMBULATORY_CARE_PROVIDER_SITE_OTHER): Payer: Medicare Other | Admitting: Cardiology

## 2016-07-14 VITALS — BP 124/64 | HR 68 | Ht 65.0 in | Wt 146.0 lb

## 2016-07-14 DIAGNOSIS — I119 Hypertensive heart disease without heart failure: Secondary | ICD-10-CM

## 2016-07-14 DIAGNOSIS — I6523 Occlusion and stenosis of bilateral carotid arteries: Secondary | ICD-10-CM

## 2016-07-14 DIAGNOSIS — I2510 Atherosclerotic heart disease of native coronary artery without angina pectoris: Secondary | ICD-10-CM

## 2016-07-14 DIAGNOSIS — Z789 Other specified health status: Secondary | ICD-10-CM | POA: Diagnosis not present

## 2016-07-14 DIAGNOSIS — E785 Hyperlipidemia, unspecified: Secondary | ICD-10-CM

## 2016-07-14 DIAGNOSIS — E782 Mixed hyperlipidemia: Secondary | ICD-10-CM

## 2016-07-14 MED ORDER — FISH OIL 1000 MG PO CAPS: 2000.0000 mg | ORAL_CAPSULE | Freq: Every day | ORAL | 3 refills | Status: DC

## 2016-07-14 NOTE — Progress Notes (Signed)
Patient ID: Renee [name], female   DOB: 1940/04/12, 76 y.o.   MRN: [id number]    Patient Name: Renee [name] Date of Encounter: 07/14/2016  Primary Care Provider:  Deland Pretty, MD Primary Cardiologist:  Ena Dawley   Patient Profile  Chief complaint: One-year follow-up  Problem List   Past Medical History:  Diagnosis Date  . Arthritis    oa  . Blood transfusion 1948  . Blood transfusion without reported diagnosis   . Breast cancer ([place]) 1994   bilateral / HX skin cancer  . Cataract    both eyes  . COPD (chronic obstructive pulmonary disease) ([place])   . Disc disease, degenerative, cervical    trouble turing head and neck at times  . Diverticulosis   . DVT (deep venous thrombosis) ([place]) 2002   left leg  . Fibromyalgia   . Hx of skin cancer, basal cell   . Hyperlipidemia   . Hypertension   . Measles as child  . Mumps age 63  . PONV (postoperative nausea and vomiting)   . Sinus tachycardia    Past Surgical History:  Procedure Laterality Date  . ABDOMINAL HYSTERECTOMY  1976   partial  . achilles tendon adn 2 bone spurs removed Left 05-2012  . [name]   lower  . bilateral mastectomy with tramflap reconstruction  1994  . COLONOSCOPY    . KNEE ARTHROSCOPY Right 2008  . KNEE SURGERY  2002   arthroscopy-bilateral  . MASTECTOMY     bilateral  . PARTIAL HYSTERECTOMY    . POLYPECTOMY    . 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 TOTAL KNEE ARTHROPLASTY Right 10/20/2013   Procedure: RIGHT TOTAL KNEE ARTHROPLASTY;  Surgeon: Gearlean Alf, MD;  Location: [facility];  Service: Orthopedics;  Laterality: Right;   Allergies  Allergies  Allergen Reactions  . Aspirin Nausea Only  . Codeine Itching    Mood changes, can take percocet  . Ibuprofen Hives    Blisters   . Lisinopril Cough   Chief complain: 1 year follow  up  HPI  76 year old female with PMH is significant for significant coronary calcification on chest CT suggestive of CAD, dyspnea on exertion, HTN, HLD, DVT, and COPD. Carotid duplex in June 2017 showed stable 1-39% right ICA stenosis and stable 40-59% left ICA stenosis. Patient has a history of multiple statin intolerances and  Is followed at our lipid clinic.  07/14/2016 - this is one year follow-up, patient remains active and tries to walk a few blocks every day, it that she has no chest pain or shortness of breath, she also denies palpitation claudications, no lower extremity edema orthopnea or peripheral dyspnea dizziness. Her syncope. She was seen by our pharmacist Megan Supple in the lipid clinic and was started on Crestor 10 mg daily that she is tolerating well, her lipids were recently checked by Dr. Shelia Media, her LDL was 106, triglycerides 164.Marland Kitchen  Home Medications  Prior to Admission medications   Medication Sig Start Date End Date Taking? Authorizing Provider  DULoxetine (CYMBALTA) 60 MG capsule Take 60 mg by mouth every evening.   Yes Historical Provider, MD  ezetimibe (ZETIA) 10 MG tablet Take 10 mg by mouth every evening.    Yes Historical Provider, MD  HYDROmorphone (DILAUDID) 2 MG tablet Take 1-2 tablets (2-4 mg total) by mouth  every 3 (three) hours as needed. 10/16/12  Yes Alexzandrew Dara Lords, PA-C  losartan-hydrochlorothiazide (HYZAAR) 100-25 MG per tablet Take 0.5 tablets by mouth every morning.    Yes Historical Provider, MD  methocarbamol (ROBAXIN) 500 MG tablet Take 1 tablet (500 mg total) by mouth every 6 (six) hours as needed. 10/16/12  Yes Alexzandrew Dara Lords, PA-C  rivaroxaban (XARELTO) 10 MG TABS tablet Take 1 tablet (10 mg total) by mouth daily with breakfast. Take Xarelto for two and a half more weeks, then discontinue Xarelto. Once the patient has completed the Xarelto, they may resume the 81 mg Aspirin. 10/16/12  Yes Alexzandrew Dara Lords, PA-C  rosuvastatin (CRESTOR) 10 MG  tablet Take 10 mg by mouth every evening.    Yes Historical Provider, MD    Family History  Family History  Problem Relation Age of Onset  . Alzheimer's disease Mother   . Alcohol abuse Father   . Sudden death Father   . Liver disease Father        fatty liver disease  . Other Father        gastritis  . Healthy Brother   . Alcohol abuse Daughter   . COPD Daughter   . Healthy Grandchild   . Heart disease Maternal Grandfather   . CAD Maternal Grandfather   . Sudden death Brother        at birth  . Unexplained death Daughter   . Alcohol abuse Daughter   . Healthy Sister   . Colon cancer Neg Hx   . Colon polyps Neg Hx   . Rectal cancer Neg Hx   . Stomach cancer Neg Hx     Social History  Social History   Social History  . Marital status: Married    Spouse name: N/A  . Number of children: 2  . Years of education: N/A   Occupational History  . RETIRED Retired   Social History Main Topics  . Smoking status: Former Smoker    Packs/day: 1.00    Years: 20.00    Types: Cigarettes    Quit date: 02/11/1992  . Smokeless tobacco: Never Used  . Alcohol use 3.0 oz/week    5 Glasses of wine per week     Comment: occasional wine  . Drug use: No  . Sexual activity: Not on file   Other Topics Concern  . Not on file   Social History Narrative  . No narrative on file     Review of Systems as per HPI General:  No chills, fever, night sweats or weight changes.  Cardiovascular:  No chest pain, dyspnea on exertion, edema, orthopnea, palpitations, paroxysmal nocturnal dyspnea. Dermatological: No rash, lesions/masses Respiratory: No cough, dyspnea Urologic: No hematuria, dysuria Abdominal:   No nausea, vomiting, diarrhea, bright red blood per rectum, melena, or hematemesis Neurologic:  No visual changes, wkns, changes in mental status. All other systems reviewed and are otherwise negative except as noted above.  Physical Exam  BP 124/64, heart rate 68, weight 146  pounds. General: Pleasant, NAD Psych: Normal affect. Neuro: Alert and oriented X 3. Moves all extremities spontaneously. HEENT: Normal  Neck: Supple, bruit over right carotid artery, no JVD. Lungs:  Resp regular and unlabored, CTA. Heart: RRR no s3, s4, or murmurs. Abdomen: Soft, non-tender, non-distended, BS + x 4.  Extremities: No clubbing, cyanosis or edema. DP/PT/Radials 2+ and equal bilaterally.  Accessory Clinical Findings  ECG - performed today 07/14/2016 was personally reviewed and shows normal sinus rhythm incomplete right bundle branch  block heart rate is now lower otherwise unchanged EKG.  LE venous Duplex  - No evidence of deep vein or superficial thrombosis involving the left lower extremity and right common femoral vein. An enlarged inguinal lymph node is noted on the left. - No evidence of Baker's cyst on the left. Other specific details can be found in the table(s) above. Prepared and Electronically Authenticated by  Elam Dutch 2014-10-20T19:[id number]  CT PE IMPRESSION: Negative for pulmonary embolism. No acute chest abnormalities.  Several punctate pulmonary nodules in both lungs. Nodules measure between 2-3 mm and are nonspecific. If the patient is at high risk for bronchogenic carcinoma, follow-up chest CT at 1year is recommended. If the patient is at low risk, no follow-up is needed. This recommendation follows the consensus statement: Guidelines for Management of Small Pulmonary Nodules Detected on CT Scans: A Statement from the [employer] as published in Radiology 2005; 237:395-400.  Electronically Signed By: Markus Daft M.D. On: 11/19/2012 15:52  TTE 12/03/2012 Study Conclusions  - Left ventricle: The cavity size was normal. Wall thickness was increased in a pattern of moderate LVH. Systolic function was normal. The estimated ejection fraction was in the range of 60% to 65%. - Atrial septum: No defect or patent foramen ovale  was identified. - Pericardium, extracardiac: A trivial pericardial effusion was identified.   Exercise nuclear stress test: 12/24/12  Quantitative Gated Spect Images  QGS EDV: 60 ml  QGS ESV: 17 ml  Impression  Exercise Capacity: Poor exercise capacity.  BP Response: Hypertensive blood pressure response.  Clinical Symptoms: Dyspnea  ECG Impression: PACs, ventricular couplet. No ischemic ST segment changes.  Comparison with Prior Nuclear Study: No significant change from previous study  Overall Impression: Normal stress nuclear study.  LV Ejection Fraction: 72%. LV Wall Motion: NL LV Function; NL Wall Motion  Loralie Champagne  12/24/2012  Exercise nuclear stress test: 07/2014  Nuclear stress EF: 83%.  Blood pressure demonstrated a hypertensive response to exercise.  Horizontal ST segment depression ST segment depression was noted during stress in the II, III, aVF, V4, V5 and V6 leads.  This is a low risk study.  The left ventricular ejection fraction is hyperdynamic (>65%).   Poor exercise tolerance achieving less than 5 METS with HTN respnse Positive ECG Perfusion images with breast attenuation no ischemia or infarct EF 83%     Assessment & Plan  1. Chest pain - with some typical and some atypical features, negative stress test in 2014, anion July 2017 her EKG shows normal sinus rhythm and normal EKG, he had hypertensive response to exertion up to 218/92 however she didn't take blood pressure medication today of her test, her blood pressure today 124/70 , I would continue the same medication. Blood pressure is controlled today.  2. Hyperlipidemia - significant with statin intolerance, she has been enrolled by Dr. Einar Gip to lipid management study, however after 5 weeks she complains of headaches and her LDL is 194. I have personally reviewed her chest CT that was done in January of this year and she has diffuse calcifications in all 3 coronary arteries. She was seen by her  pharmacist and started on Crestor 10 mg daily, her LDL decreased to 106, triglycerides remain mildly elevated at 164, I will add fish oil 2 g daily regimen.  3. Hypertensive heart disease without CHF - she is euvolemic and blood pressure is controlled.  4. Sinus tachycardia. Chest CT ruled out pulmonary embolism. Tachycardia has resolved with Metoprolol 50 mg po  bid. heart rate today 68 bpm.  5. Bilateral carotid disease- her carotid duplex showed stable 1-39%, right ICA stenosis, and stable 40-59% Left ICA stenosis in 07/2015. Repeat schedule for 07/26/2016..  Follow up in 1 year.   Ena Dawley, MD 07/14/2016, 8:54 AM

## 2016-07-14 NOTE — Patient Instructions (Addendum)
Medication Instructions:  Your physician has recommended you make the following change in your medication:   START fish oil 2 grams daily   Labwork: None ordered  Testing/Procedures: None ordered  Follow-Up: Your physician recommends that you schedule a follow-up appointment in: [place] wants you to follow-up in: 1 year with Dr. [name]. You will receive a reminder letter in the mail two months in advance. If you don't receive a letter, please call our office to schedule the follow-up appointment.   Any Other Special Instructions Will Be Listed Below (If Applicable).     If you need a refill on your cardiac medications before your next appointment, please call your pharmacy.

## 2016-07-26 ENCOUNTER — Ambulatory Visit: Payer: Medicare Other

## 2016-07-26 ENCOUNTER — Ambulatory Visit (HOSPITAL_COMMUNITY)
Admission: RE | Admit: 2016-07-26 | Discharge: 2016-07-26 | Disposition: A | Discharge status: Home / Self Care | Payer: Medicare Other | Source: Ambulatory Visit | Attending: Internal Medicine | Admitting: Internal Medicine

## 2016-07-26 DIAGNOSIS — I6523 Occlusion and stenosis of bilateral carotid arteries: Secondary | ICD-10-CM | POA: Insufficient documentation

## 2016-07-26 DIAGNOSIS — E785 Hyperlipidemia, unspecified: Secondary | ICD-10-CM | POA: Insufficient documentation

## 2016-07-26 DIAGNOSIS — M12841 Other specific arthropathies, not elsewhere classified, right hand: Secondary | ICD-10-CM | POA: Diagnosis not present

## 2016-07-26 DIAGNOSIS — Z79899 Other long term (current) drug therapy: Secondary | ICD-10-CM | POA: Diagnosis not present

## 2016-07-26 DIAGNOSIS — M546 Pain in thoracic spine: Secondary | ICD-10-CM | POA: Diagnosis not present

## 2016-07-26 DIAGNOSIS — M47812 Spondylosis without myelopathy or radiculopathy, cervical region: Secondary | ICD-10-CM | POA: Diagnosis not present

## 2016-07-26 DIAGNOSIS — I10 Essential (primary) hypertension: Secondary | ICD-10-CM | POA: Diagnosis not present

## 2016-07-26 DIAGNOSIS — R202 Paresthesia of skin: Secondary | ICD-10-CM | POA: Diagnosis not present

## 2016-07-26 DIAGNOSIS — M2550 Pain in unspecified joint: Secondary | ICD-10-CM | POA: Diagnosis not present

## 2016-07-26 DIAGNOSIS — M79643 Pain in unspecified hand: Secondary | ICD-10-CM | POA: Diagnosis not present

## 2016-07-26 DIAGNOSIS — E559 Vitamin D deficiency, unspecified: Secondary | ICD-10-CM | POA: Diagnosis not present

## 2016-07-26 DIAGNOSIS — M542 Cervicalgia: Secondary | ICD-10-CM | POA: Diagnosis not present

## 2016-07-26 DIAGNOSIS — E118 Type 2 diabetes mellitus with unspecified complications: Secondary | ICD-10-CM | POA: Diagnosis not present

## 2016-07-26 DIAGNOSIS — E7800 Pure hypercholesterolemia, unspecified: Secondary | ICD-10-CM | POA: Diagnosis not present

## 2016-07-26 DIAGNOSIS — M19042 Primary osteoarthritis, left hand: Secondary | ICD-10-CM | POA: Diagnosis not present

## 2016-07-28 ENCOUNTER — Ambulatory Visit: Payer: Medicare Other

## 2016-08-07 DIAGNOSIS — E7800 Pure hypercholesterolemia, unspecified: Secondary | ICD-10-CM | POA: Diagnosis not present

## 2016-08-07 DIAGNOSIS — E118 Type 2 diabetes mellitus with unspecified complications: Secondary | ICD-10-CM | POA: Diagnosis not present

## 2016-08-10 DIAGNOSIS — I10 Essential (primary) hypertension: Secondary | ICD-10-CM | POA: Diagnosis not present

## 2016-08-10 DIAGNOSIS — E7800 Pure hypercholesterolemia, unspecified: Secondary | ICD-10-CM | POA: Diagnosis not present

## 2016-08-10 DIAGNOSIS — E118 Type 2 diabetes mellitus with unspecified complications: Secondary | ICD-10-CM | POA: Diagnosis not present

## 2016-08-17 ENCOUNTER — Ambulatory Visit (INDEPENDENT_AMBULATORY_CARE_PROVIDER_SITE_OTHER): Payer: Self-pay | Admitting: Neurology

## 2016-08-17 ENCOUNTER — Ambulatory Visit (INDEPENDENT_AMBULATORY_CARE_PROVIDER_SITE_OTHER): Payer: Medicare Other | Admitting: Neurology

## 2016-08-17 DIAGNOSIS — Z0289 Encounter for other administrative examinations: Secondary | ICD-10-CM

## 2016-08-17 DIAGNOSIS — G5603 Carpal tunnel syndrome, bilateral upper limbs: Secondary | ICD-10-CM

## 2016-08-17 NOTE — Procedures (Signed)
Full Name: Renee [name] Gender: Female MRN #: [id number] Date of Birth: Sep 07, 1940    Visit Date: 08/17/2016 09:58 Age: 76 Years 50 Months Old Examining Physician: Sarina Ill, MD  Referring Physician: Deland Pretty, MD    History: Evaluation for CTS  [place]:  The right median motor nerve showed delayed distal onset latency (5.7 ms, N<4.4).  The right median orthodromic sensory nerve showed delayed distal peak latency (5.42ms, N<3.40) and reduced amplitude (5 V, N>10). The left median orthodromic sensory nerve showed delayed distal peak latency (3.97ms, N<3.40) and reduced amplitude (8 V, N>10). All remaining nerves were normal as detailed below. All muscles are normal as detailed below.   Conclusion: There is moderately-severe right carpal tunnel syndrome and mild left carpal tunnel syndrome. No suggestion of cervical radiculopathy.  Cc: Dr. Cheron Schaumann M.D.  [facility] [place], [place] [address] Tel: [phone] Fax: [phone]        [facility]    Nerve / Sites Rec. Site Latency Ref. Amplitude Ref. Rel Amp Segments Distance Velocity Ref. Area    ms ms mV mV %  cm m/s m/s mVms  R Median - APB     Wrist APB 5.7 ?4.4 4.5 ?4.0 100 Wrist - APB 7   18.9     Upper arm APB 9.4  4.8  105 Upper arm - Wrist 20 55 ?49 20.4  L Median - APB     Wrist APB 4.3 ?4.4 5.0 ?4.0 100 Wrist - APB 7   18.8     Upper arm APB 7.9  4.6  92.4 Upper arm - Wrist 20 56 ?49 17.7  R Ulnar - ADM     Wrist ADM 3.2 ?3.3 5.6 ?6.0 100 Wrist - ADM 7   21.5     B.Elbow ADM 6.0  4.8  86.5 B.Elbow - Wrist 16 58 ?49 19.3     A.Elbow ADM 8.2  4.9  103 A.Elbow - B.Elbow 12 55 ?49 20.8         A.Elbow - Wrist               SNC    Nerve / Sites Rec. Site Peak Lat Ref.  Amp Ref. Segments Distance    ms ms V V  cm  R Median - Orthodromic (Dig II, Mid palm)     Dig II Wrist 5.05 ?3.40 5 ?10 Dig II - Wrist 13  L Median - Orthodromic (Dig II, Mid palm)     Dig II  Wrist 3.85 ?3.40 8 ?10 Dig II - Wrist 13  R Ulnar - Orthodromic, (Dig V, Mid palm)     Dig V Wrist 2.86 ?3.10 7 ?5 Dig V - Wrist 69           F  Wave    Nerve F Lat Ref.   ms ms  R Ulnar - ADM 26.4 ?32.0       EMG full       EMG Summary Table    Spontaneous MUAP Recruitment  Muscle IA Fib PSW Fasc Other Amp Dur. Poly Pattern  Bilat. Deltoid Normal None None None _______ Normal Normal Normal Normal  Bilat. Triceps brachii Normal None None None _______ Normal Normal Normal Normal  Bilat. Pronator teres Normal None None None _______ Normal Normal Normal Normal  Bilat. First dorsal interosseous Normal None None None _______ Normal Normal Normal Normal  Bilat. Opponens pollicis Normal None None None  _______ Normal Normal Normal Normal  Bilat. Cervical paraspinals (low) Normal None None None _______ Normal Normal Normal Normal

## 2016-08-17 NOTE — Progress Notes (Signed)
Full Name: Renee [name] Gender: Female MRN #: [id number] Date of Birth: May 30, 1940    Visit Date: 08/17/2016 09:58 Age: 76 Years 64 Months Old Examining Physician: Sarina Ill, MD  Referring Physician: Deland Pretty, MD    History: Evaluation for CTS  [place]:  The right median motor nerve showed delayed distal onset latency (5.7 ms, N<4.4).  The right median orthodromic sensory nerve showed delayed distal peak latency (5.36ms, N<3.40) and reduced amplitude (5 V, N>10). The left median orthodromic sensory nerve showed delayed distal peak latency (3.67ms, N<3.40) and reduced amplitude (8 V, N>10). All remaining nerves were normal as detailed below. All muscles are normal as detailed below.   Conclusion: There is moderately-severe right carpal tunnel syndrome and mild left carpal tunnel syndrome. No suggestion of cervical radiculopathy.  Cc: Dr. Cheron Schaumann M.D.  [facility] [place], [place] [address] Tel: [phone] Fax: [phone]        [facility]    Nerve / Sites Rec. Site Latency Ref. Amplitude Ref. Rel Amp Segments Distance Velocity Ref. Area    ms ms mV mV %  cm m/s m/s mVms  R Median - APB     Wrist APB 5.7 ?4.4 4.5 ?4.0 100 Wrist - APB 7   18.9     Upper arm APB 9.4  4.8  105 Upper arm - Wrist 20 55 ?49 20.4  L Median - APB     Wrist APB 4.3 ?4.4 5.0 ?4.0 100 Wrist - APB 7   18.8     Upper arm APB 7.9  4.6  92.4 Upper arm - Wrist 20 56 ?49 17.7  R Ulnar - ADM     Wrist ADM 3.2 ?3.3 5.6 ?6.0 100 Wrist - ADM 7   21.5     B.Elbow ADM 6.0  4.8  86.5 B.Elbow - Wrist 16 58 ?49 19.3     A.Elbow ADM 8.2  4.9  103 A.Elbow - B.Elbow 12 55 ?49 20.8         A.Elbow - Wrist               SNC    Nerve / Sites Rec. Site Peak Lat Ref.  Amp Ref. Segments Distance    ms ms V V  cm  R Median - Orthodromic (Dig II, Mid palm)     Dig II Wrist 5.05 ?3.40 5 ?10 Dig II - Wrist 13  L Median - Orthodromic (Dig II, Mid palm)     Dig II  Wrist 3.85 ?3.40 8 ?10 Dig II - Wrist 13  R Ulnar - Orthodromic, (Dig V, Mid palm)     Dig V Wrist 2.86 ?3.10 7 ?5 Dig V - Wrist 83           F  Wave    Nerve F Lat Ref.   ms ms  R Ulnar - ADM 26.4 ?32.0       EMG full       EMG Summary Table    Spontaneous MUAP Recruitment  Muscle IA Fib PSW Fasc Other Amp Dur. Poly Pattern  Bilat. Deltoid Normal None None None _______ Normal Normal Normal Normal  Bilat. Triceps brachii Normal None None None _______ Normal Normal Normal Normal  Bilat. Pronator teres Normal None None None _______ Normal Normal Normal Normal  Bilat. First dorsal interosseous Normal None None None _______ Normal Normal Normal Normal  Bilat. Opponens pollicis Normal None None  None _______ Normal Normal Normal Normal  Bilat. Cervical paraspinals (low) Normal None None None _______ Normal Normal Normal Normal

## 2016-08-17 NOTE — Progress Notes (Signed)
See procedure note.

## 2016-08-22 ENCOUNTER — Telehealth: Payer: Self-pay | Admitting: Neurology

## 2016-08-22 NOTE — Telephone Encounter (Signed)
EMG/NCV results faxed as requested.

## 2016-08-22 NOTE — Telephone Encounter (Signed)
Joy from [facility] (office that referred pt ) is asking for the results of the Nerve Conduction Study, she is asking to be either called at [phone] or if at all possible to have the results faxed over to [phone]

## 2016-08-23 DIAGNOSIS — M2550 Pain in unspecified joint: Secondary | ICD-10-CM | POA: Diagnosis not present

## 2016-08-23 DIAGNOSIS — M79643 Pain in unspecified hand: Secondary | ICD-10-CM | POA: Diagnosis not present

## 2016-08-23 DIAGNOSIS — M542 Cervicalgia: Secondary | ICD-10-CM | POA: Diagnosis not present

## 2016-08-23 DIAGNOSIS — G5600 Carpal tunnel syndrome, unspecified upper limb: Secondary | ICD-10-CM | POA: Diagnosis not present

## 2016-08-23 DIAGNOSIS — M1990 Unspecified osteoarthritis, unspecified site: Secondary | ICD-10-CM | POA: Diagnosis not present

## 2016-09-25 ENCOUNTER — Other Ambulatory Visit: Payer: Self-pay | Admitting: Cardiology

## 2016-10-23 ENCOUNTER — Ambulatory Visit (INDEPENDENT_AMBULATORY_CARE_PROVIDER_SITE_OTHER): Payer: Medicare Other | Admitting: Otolaryngology

## 2016-11-01 DIAGNOSIS — M25512 Pain in left shoulder: Secondary | ICD-10-CM | POA: Diagnosis not present

## 2016-11-01 DIAGNOSIS — G8929 Other chronic pain: Secondary | ICD-10-CM | POA: Diagnosis not present

## 2016-11-01 DIAGNOSIS — M47812 Spondylosis without myelopathy or radiculopathy, cervical region: Secondary | ICD-10-CM | POA: Diagnosis not present

## 2016-11-01 DIAGNOSIS — M542 Cervicalgia: Secondary | ICD-10-CM | POA: Diagnosis not present

## 2016-11-01 DIAGNOSIS — M25511 Pain in right shoulder: Secondary | ICD-10-CM | POA: Diagnosis not present

## 2016-11-07 DIAGNOSIS — E7800 Pure hypercholesterolemia, unspecified: Secondary | ICD-10-CM | POA: Diagnosis not present

## 2016-11-07 DIAGNOSIS — E118 Type 2 diabetes mellitus with unspecified complications: Secondary | ICD-10-CM | POA: Diagnosis not present

## 2016-11-10 DIAGNOSIS — Z23 Encounter for immunization: Secondary | ICD-10-CM | POA: Diagnosis not present

## 2016-11-15 DIAGNOSIS — E7800 Pure hypercholesterolemia, unspecified: Secondary | ICD-10-CM | POA: Diagnosis not present

## 2016-11-15 DIAGNOSIS — I10 Essential (primary) hypertension: Secondary | ICD-10-CM | POA: Diagnosis not present

## 2016-11-15 DIAGNOSIS — E118 Type 2 diabetes mellitus with unspecified complications: Secondary | ICD-10-CM | POA: Diagnosis not present

## 2016-11-16 ENCOUNTER — Ambulatory Visit (INDEPENDENT_AMBULATORY_CARE_PROVIDER_SITE_OTHER): Payer: Medicare Other | Admitting: Otolaryngology

## 2016-11-16 ENCOUNTER — Encounter: Payer: Self-pay | Admitting: *Deleted

## 2016-11-16 DIAGNOSIS — H6123 Impacted cerumen, bilateral: Secondary | ICD-10-CM | POA: Diagnosis not present

## 2016-11-16 DIAGNOSIS — H903 Sensorineural hearing loss, bilateral: Secondary | ICD-10-CM

## 2016-11-16 DIAGNOSIS — H9201 Otalgia, right ear: Secondary | ICD-10-CM | POA: Diagnosis not present

## 2016-11-20 ENCOUNTER — Ambulatory Visit (INDEPENDENT_AMBULATORY_CARE_PROVIDER_SITE_OTHER): Payer: Medicare Other | Admitting: Internal Medicine

## 2016-11-20 ENCOUNTER — Other Ambulatory Visit (INDEPENDENT_AMBULATORY_CARE_PROVIDER_SITE_OTHER): Payer: Medicare Other

## 2016-11-20 ENCOUNTER — Encounter: Payer: Self-pay | Admitting: Internal Medicine

## 2016-11-20 VITALS — BP 114/62 | HR 74 | Ht 65.0 in | Wt 165.8 lb

## 2016-11-20 DIAGNOSIS — R1013 Epigastric pain: Secondary | ICD-10-CM

## 2016-11-20 DIAGNOSIS — R109 Unspecified abdominal pain: Secondary | ICD-10-CM | POA: Diagnosis not present

## 2016-11-20 DIAGNOSIS — I6523 Occlusion and stenosis of bilateral carotid arteries: Secondary | ICD-10-CM

## 2016-11-20 DIAGNOSIS — Z86010 Personal history of colonic polyps: Secondary | ICD-10-CM | POA: Diagnosis not present

## 2016-11-20 LAB — CBC WITH DIFFERENTIAL/PLATELET
Basophils Absolute: 0 10*3/uL (ref 0.0–0.1)
Basophils Relative: 0.3 % (ref 0.0–3.0)
Eosinophils Absolute: 0.1 10*3/uL (ref 0.0–0.7)
Eosinophils Relative: 1.1 % (ref 0.0–5.0)
HCT: 42.4 % (ref 36.0–46.0)
Hemoglobin: 14.3 g/dL (ref 12.0–15.0)
Lymphocytes Relative: 25 % (ref 12.0–46.0)
Lymphs Abs: 1.7 10*3/uL (ref 0.7–4.0)
MCHC: 33.7 g/dL (ref 30.0–36.0)
MCV: 95.1 fl (ref 78.0–100.0)
Monocytes Absolute: 0.5 10*3/uL (ref 0.1–1.0)
Monocytes Relative: 7.2 % (ref 3.0–12.0)
Neutro Abs: 4.4 10*3/uL (ref 1.4–7.7)
Neutrophils Relative %: 66.4 % (ref 43.0–77.0)
Platelets: 243 10*3/uL (ref 150.0–400.0)
RBC: 4.46 Mil/uL (ref 3.87–5.11)
RDW: 12.4 % (ref 11.5–15.5)
WBC: 6.7 10*3/uL (ref 4.0–10.5)

## 2016-11-20 LAB — COMPREHENSIVE METABOLIC PANEL
ALT: 7 U/L (ref 0–35)
AST: 18 U/L (ref 0–37)
Albumin: 4.3 g/dL (ref 3.5–5.2)
Alkaline Phosphatase: 62 U/L (ref 39–117)
BUN: 11 mg/dL (ref 6–23)
CO2: 31 mEq/L (ref 19–32)
Calcium: 9.8 mg/dL (ref 8.4–10.5)
Chloride: 99 mEq/L (ref 96–112)
Creatinine, Ser: 0.62 mg/dL (ref 0.40–1.20)
GFR: 99.4 mL/min (ref >60.00)
Glucose, Bld: 110 mg/dL — ABNORMAL HIGH (ref 70–99)
Potassium: 4.3 mEq/L (ref 3.5–5.1)
Sodium: 139 mEq/L (ref 135–145)
Total Bilirubin: 0.6 mg/dL (ref 0.2–1.2)
Total Protein: 7.3 g/dL (ref 6.0–8.3)

## 2016-11-20 MED ORDER — PANTOPRAZOLE SODIUM 40 MG PO TBEC: 40.0000 mg | DELAYED_RELEASE_TABLET | Freq: Every day | ORAL | 0 refills | Status: DC

## 2016-11-20 NOTE — Patient Instructions (Signed)
Your physician has requested that you go to the basement for the following lab work before leaving today: CBC, CMP, Hpylori stool antigen  AFTER returning the H Pylori stool antigen, please start Protonix 40 mg daily x 1 month.  Call our office in 1 month. If you are no better, you may need an upper endoscopy.  You have been scheduled for a CT scan of the abdomen and pelvis at [facility] ([address]---this is in the same building as Press [facility]).   You are scheduled on Thursday, 11/23/16  at 11:30 am. You should arrive 15 minutes prior to your appointment time for registration. Please follow the written instructions below on the day of your exam:  WARNING: IF YOU ARE ALLERGIC TO IODINE/X-RAY DYE, PLEASE NOTIFY RADIOLOGY IMMEDIATELY AT [phone]! YOU WILL BE GIVEN A 13 HOUR PREMEDICATION PREP.  1) Do not eat or drink anything after 7:30 am (4 hours prior to your test) 2) You have been given 2 bottles of oral contrast to drink. The solution may taste better if refrigerated, but do NOT add ice or any other liquid to this solution. Shake well before drinking.    Drink 1 bottle of contrast @ 9:30 am (2 hours prior to your exam)  Drink 1 bottle of contrast @ 10:30 am (1 hour prior to your exam)  You may take any medications as prescribed with a small amount of water except for the following: Metformin, Glucophage, Glucovance, Avandamet, Riomet, Fortamet, Actoplus Met, Janumet, Glumetza or Metaglip. The above medications must be held the day of the exam AND 48 hours after the exam.  The purpose of you drinking the oral contrast is to aid in the visualization of your intestinal tract. The contrast solution may cause some diarrhea. Before your exam is started, you will be given a small amount of fluid to drink. Depending on your individual set of symptoms, you may also receive an intravenous injection of x-ray contrast/dye. Plan on being at [facility] for 30 minutes  or longer, depending on the type of exam you are having performed.  This test typically takes 30-45 minutes to complete.  If you have any questions regarding your exam or if you need to reschedule, you may call the CT department at [phone] between the hours of 8:00 am and 5:00 pm, Monday-Friday.  ________________________________________________________________________  If you are age 20 or older, your body mass index should be between 23-30. Your Body mass index is 27.59 kg/m. If this is out of the aforementioned range listed, please consider follow up with your Primary Care Provider.  If you are age 67 or younger, your body mass index should be between 19-25. Your Body mass index is 27.59 kg/m. If this is out of the aformentioned range listed, please consider follow up with your Primary Care Provider.

## 2016-11-20 NOTE — Progress Notes (Signed)
Patient ID: Renee [name], female   DOB: 1940-02-20, 76 y.o.   MRN: [id number] HPI: Renee [name] is a 76 year old female with a past medical history of colon polyps, colonic diverticulosis, breast cancer status post bilateral mastectomy and reconstruction, remote DVT, hypertension, hyperlipidemia and mild COPD who is seen in follow-up to evaluate epigastric abdominal pain. She is here alone today. She was last seen in the time of her surveillance colonoscopy on 07/06/2015.  Colonoscopy in June 2017 revealed 5 polyps ranging in size from 2-6 mm. There are multiple small and large mouth diverticula in the left colon. Internal hemorrhoids were found.  These polyps are found to be sessile serrated polyps without cytologic dysplasia and tubular adenomas.  Today she reports that she has developed 3-4 months of epigastric abdominal pain. This comes and goes. It is not daily but multiple times per week. When it happens it can be sharp lasting 5-10 minutes. It does not radiate. It can be associated with nausea. Seems to be worse in the morning particularly on an empty stomach. Does not wake her from sleep. Worsened by sodas and spicy foods. No vomiting. Some heartburn for which she is using Tums. She has prescription for pantoprazole but has been off of this over a month. Never was taking it on a daily basis. No early satiety. No dysphagia or odynophagia. She feels her weight is been stable but primary care suggested she was down about 4 pounds in the last 3 months. She reports regular bowel movements on a daily basis. Evacuation feels complete. She does have some chronic left-sided abdominal discomfort which is separate from the epigastric pain mentioned previously. This pain is more often on and not concerning to her. She denies blood in her stool or melena. She did stop her probiotic, vitamin C and CoQ10 just to see if these were contributing to her epigastric pain. She does not use NSAIDs.  She had a remote  upper endoscopy which Dr. Sharlett [name] performed in 2002. This was normal.  Past Medical History:  Diagnosis Date  . Arthritis    oa  . Blood transfusion 1948  . Blood transfusion without reported diagnosis   . Breast cancer ([place]) 1994   bilateral / HX skin cancer  . Cataract    both eyes  . COPD (chronic obstructive pulmonary disease) ([place])   . Disc disease, degenerative, cervical    trouble turing head and neck at times  . Diverticulosis   . DVT (deep venous thrombosis) ([place]) 2002   left leg  . Fibromyalgia   . Hx of skin cancer, basal cell   . Hyperlipidemia   . Hypertension   . Measles as child  . Mumps age 76  . PONV (postoperative nausea and vomiting)   . Sinus tachycardia   . Tubular adenoma of colon     Past Surgical History:  Procedure Laterality Date  . ABDOMINAL HYSTERECTOMY  1976   partial  . achilles tendon adn 2 bone spurs removed Left 05-2012  . [place]   lower  . bilateral mastectomy with tramflap reconstruction  1994  . COLONOSCOPY    . KNEE ARTHROSCOPY Right 2008  . KNEE SURGERY  2002   arthroscopy-bilateral  . MASTECTOMY     bilateral  . PARTIAL HYSTERECTOMY    . POLYPECTOMY    . TOTAL KNEE ARTHROPLASTY Left 10/14/2012   Procedure: LEFT TOTAL KNEE ARTHROPLASTY;  Surgeon: Gearlean Alf, MD;  Location: [facility];  Service: Orthopedics;  Laterality:  Left;  . TOTAL KNEE ARTHROPLASTY Right 10/20/2013   Procedure: RIGHT TOTAL KNEE ARTHROPLASTY;  Surgeon: Gearlean Alf, MD;  Location: [facility];  Service: Orthopedics;  Laterality: Right;    Outpatient Medications Prior to Visit  Medication Sig Dispense Refill  . cholecalciferol (VITAMIN D) 1000 units tablet Take 1,000 Units by mouth daily.    Marland Kitchen losartan-hydrochlorothiazide (HYZAAR) 100-25 MG per tablet Take 0.5 tablets by mouth every morning.     . metoprolol tartrate (LOPRESSOR) 50 MG tablet take 1 tablet twice a day 180 tablet 2  . Multiple Vitamin (MULTIVITAMIN) tablet Take 1 tablet by mouth  daily.    . Omega-3 Fatty Acids (FISH OIL) 1000 MG CAPS Take 2 capsules (2,000 mg total) by mouth daily. 180 capsule 3  . rosuvastatin (CRESTOR) 10 MG tablet Take 10 mg by mouth daily.  0  . vitamin E 400 UNIT capsule Take 400 Units by mouth daily.    Marland Kitchen aspirin 81 MG tablet Take 81 mg by mouth at bedtime.    Marland Kitchen HYDROcodone-acetaminophen (NORCO/VICODIN) 5-325 MG tablet Take 0.5 tablets by mouth as needed for moderate pain.   0  . Magnesium 250 MG TABS Take 1 tablet by mouth daily.    . Probiotic Product (PROBIOTIC ADVANCED PO) Take 1 capsule by mouth daily.     No facility-administered medications prior to visit.     Allergies  Allergen Reactions  . Aspirin Nausea Only  . Codeine Itching    Mood changes, can take percocet  . Ibuprofen Hives    Blisters   . Lisinopril Cough    Family History  Problem Relation Age of Onset  . Alzheimer's disease Mother   . Alcohol abuse Father   . Sudden death Father   . Liver disease Father        fatty liver disease  . Other Father        gastritis  . Healthy Brother   . Alcohol abuse Daughter   . COPD Daughter   . Healthy Grandchild   . Heart disease Maternal Grandfather   . CAD Maternal Grandfather   . Sudden death Brother        at birth  . Unexplained death Daughter   . Alcohol abuse Daughter   . Healthy Sister   . Colon cancer Neg Hx   . Colon polyps Neg Hx   . Rectal cancer Neg Hx   . Stomach cancer Neg Hx     Social History  Substance Use Topics  . Smoking status: Former Smoker    Packs/day: 1.00    Years: 20.00    Types: Cigarettes    Quit date: 02/11/1992  . Smokeless tobacco: Never Used  . Alcohol use 3.0 oz/week    5 Glasses of wine per week     Comment: occasional wine    ROS: As per history of present illness, otherwise negative  BP 114/62   Pulse 74   Ht 5\' 5"  (1.651 m)   Wt 165 lb 12.8 oz (75.2 kg)   BMI 27.59 kg/m  Constitutional: Well-developed and well-nourished. No distress. HEENT: Normocephalic  and atraumatic. Oropharynx is clear and moist. Conjunctivae are normal.  No scleral icterus. Neck: Neck supple. Trachea midline. Cardiovascular: Normal rate, regular rhythm and intact distal pulses. No M/R/G Pulmonary/chest: Effort normal and breath sounds normal. No wheezing, rales or rhonchi. Abdominal: Soft, epigastric and lower abdominal tenderness with deep palpation, nondistended. Bowel sounds active throughout. There are no masses palpable. No hepatosplenomegaly. Extremities:  no clubbing, cyanosis, or edema Neurological: Alert and oriented to person place and time. Skin: Skin is warm and dry.  Psychiatric: Normal mood and affect. Behavior is normal.  ASSESSMENT/PLAN: 76 year old female with a past medical history of colon polyps, colonic diverticulosis, breast cancer status post bilateral mastectomy and reconstruction, remote DVT, hypertension, hyperlipidemia and mild COPD who is seen in follow-up to evaluate epigastric abdominal pain.  1. Epigastric abdominal pain -- new abdominal pain in the epigastrium over the last 3-4 months. Associated with nausea. Certainly could be acid peptic. I recommended we check an H. Pylori stool antigen and after submitted that she try pantoprazole 40 mg daily before breakfast 1 month. I'm also proceeding with a CT scan of the abdomen and pelvis with IV contrast. Check CBC, CMP as well. If CT unremarkable and pain persists after one month trial of PPI than upper endoscopy is recommended. She is understanding and happy with this plan.  2. History of colon polyps -- surveillance colonoscopy would be recommended at the 3 year interval which would be June 2020   MB:BUYZJ, Thayer Jew, [place] [address] [place], [place] [address]

## 2016-11-23 ENCOUNTER — Ambulatory Visit (INDEPENDENT_AMBULATORY_CARE_PROVIDER_SITE_OTHER)
Admission: RE | Admit: 2016-11-23 | Discharge: 2016-11-23 | Disposition: A | Discharge status: Home / Self Care | Payer: Medicare Other | Source: Ambulatory Visit | Attending: Internal Medicine | Admitting: Internal Medicine

## 2016-11-23 DIAGNOSIS — R1013 Epigastric pain: Secondary | ICD-10-CM | POA: Diagnosis not present

## 2016-11-23 DIAGNOSIS — K5730 Diverticulosis of large intestine without perforation or abscess without bleeding: Secondary | ICD-10-CM | POA: Diagnosis not present

## 2016-11-23 MED ORDER — IOPAMIDOL (ISOVUE-300) INJECTION 61%
100.0000 mL | Freq: Once | INTRAVENOUS | Status: AC | PRN
  Administered 2016-11-23: 100 mL via INTRAVENOUS

## 2016-11-24 ENCOUNTER — Telehealth: Payer: Self-pay | Admitting: *Deleted

## 2016-11-24 ENCOUNTER — Other Ambulatory Visit: Payer: Medicare Other

## 2016-11-24 LAB — HELICOBACTER PYLORI  SPECIAL ANTIGEN
MICRO NUMBER:: 81198156
SPECIMEN QUALITY: ADEQUATE

## 2016-11-24 NOTE — Telephone Encounter (Signed)
Patient's pantoprazole 40 mg (generic only) has been approved until 11-24-17.

## 2016-11-27 ENCOUNTER — Telehealth: Payer: Self-pay | Admitting: Internal Medicine

## 2016-11-27 NOTE — Telephone Encounter (Signed)
Left message advising patient that insurance approved the pantoprazole on Friday. I also called pharmacy and they state that they got a paid claim.

## 2016-12-13 DIAGNOSIS — L814 Other melanin hyperpigmentation: Secondary | ICD-10-CM | POA: Diagnosis not present

## 2016-12-13 DIAGNOSIS — L821 Other seborrheic keratosis: Secondary | ICD-10-CM | POA: Diagnosis not present

## 2016-12-13 DIAGNOSIS — D1801 Hemangioma of skin and subcutaneous tissue: Secondary | ICD-10-CM | POA: Diagnosis not present

## 2016-12-26 ENCOUNTER — Telehealth: Payer: Self-pay | Admitting: Pharmacist

## 2016-12-26 DIAGNOSIS — E785 Hyperlipidemia, unspecified: Secondary | ICD-10-CM

## 2016-12-26 NOTE — Telephone Encounter (Signed)
Pt called today to set up appt as f/u from appt in June for lipid clinic. She has not had a recent lipid panel. Lipid panel and appt scheduled as per patient request.

## 2017-01-09 ENCOUNTER — Other Ambulatory Visit: Payer: Medicare Other

## 2017-01-11 ENCOUNTER — Ambulatory Visit: Payer: Medicare Other

## 2017-01-15 DIAGNOSIS — K219 Gastro-esophageal reflux disease without esophagitis: Secondary | ICD-10-CM | POA: Diagnosis not present

## 2017-01-15 DIAGNOSIS — Z0000 Encounter for general adult medical examination without abnormal findings: Secondary | ICD-10-CM | POA: Diagnosis not present

## 2017-01-15 DIAGNOSIS — E118 Type 2 diabetes mellitus with unspecified complications: Secondary | ICD-10-CM | POA: Diagnosis not present

## 2017-01-15 DIAGNOSIS — E7800 Pure hypercholesterolemia, unspecified: Secondary | ICD-10-CM | POA: Diagnosis not present

## 2017-01-15 DIAGNOSIS — M8580 Other specified disorders of bone density and structure, unspecified site: Secondary | ICD-10-CM | POA: Diagnosis not present

## 2017-01-15 DIAGNOSIS — E559 Vitamin D deficiency, unspecified: Secondary | ICD-10-CM | POA: Diagnosis not present

## 2017-01-18 DIAGNOSIS — I10 Essential (primary) hypertension: Secondary | ICD-10-CM | POA: Diagnosis not present

## 2017-01-18 DIAGNOSIS — J449 Chronic obstructive pulmonary disease, unspecified: Secondary | ICD-10-CM | POA: Diagnosis not present

## 2017-01-18 DIAGNOSIS — Z853 Personal history of malignant neoplasm of breast: Secondary | ICD-10-CM | POA: Diagnosis not present

## 2017-01-18 DIAGNOSIS — M791 Myalgia, unspecified site: Secondary | ICD-10-CM | POA: Diagnosis not present

## 2017-01-18 DIAGNOSIS — G5603 Carpal tunnel syndrome, bilateral upper limbs: Secondary | ICD-10-CM | POA: Diagnosis not present

## 2017-01-22 ENCOUNTER — Other Ambulatory Visit: Payer: Self-pay | Admitting: Internal Medicine

## 2017-01-22 DIAGNOSIS — R911 Solitary pulmonary nodule: Secondary | ICD-10-CM

## 2017-01-26 ENCOUNTER — Ambulatory Visit
Admission: RE | Admit: 2017-01-26 | Discharge: 2017-01-26 | Disposition: A | Discharge status: Home / Self Care | Payer: Medicare Other | Source: Ambulatory Visit | Attending: Internal Medicine | Admitting: Internal Medicine

## 2017-01-26 DIAGNOSIS — R918 Other nonspecific abnormal finding of lung field: Secondary | ICD-10-CM | POA: Diagnosis not present

## 2017-01-26 DIAGNOSIS — R911 Solitary pulmonary nodule: Secondary | ICD-10-CM

## 2017-01-29 ENCOUNTER — Other Ambulatory Visit: Payer: Medicare Other | Admitting: *Deleted

## 2017-01-29 DIAGNOSIS — E785 Hyperlipidemia, unspecified: Secondary | ICD-10-CM | POA: Diagnosis not present

## 2017-01-29 LAB — LIPID PANEL
Chol/HDL Ratio: 4.6 ratio — ABNORMAL HIGH (ref 0.0–4.4)
Cholesterol, Total: 251 mg/dL — ABNORMAL HIGH (ref 100–199)
HDL: 55 mg/dL (ref >39)
LDL Calculated: 157 mg/dL — ABNORMAL HIGH (ref 0–99)
Triglycerides: 195 mg/dL — ABNORMAL HIGH (ref 0–149)
VLDL Cholesterol Cal: 39 mg/dL (ref 5–40)

## 2017-01-29 LAB — HEPATIC FUNCTION PANEL
ALT: 9 IU/L (ref 0–32)
AST: 19 IU/L (ref 0–40)
Albumin: 4.3 g/dL (ref 3.5–4.8)
Alkaline Phosphatase: 71 IU/L (ref 39–117)
Bilirubin Total: 0.5 mg/dL (ref 0.0–1.2)
Bilirubin, Direct: 0.14 mg/dL (ref 0.00–0.40)
Total Protein: 6.6 g/dL (ref 6.0–8.5)

## 2017-01-31 ENCOUNTER — Telehealth: Payer: Self-pay | Admitting: *Deleted

## 2017-01-31 NOTE — Telephone Encounter (Signed)
D/C'ed Rosuvastatin from pts med list for non-compliance with this med d/t side effects of muscle aches.  Dr [name] to advise on a different regimen.  Pt has a lipid clinic appt tomorrow with Pharmacy.

## 2017-01-31 NOTE — Progress Notes (Signed)
Patient ID: Renee [name]                 DOB: [birth year]                    MRN: [id number]     HPI: [name] is a 77 y.o. female patient of Dr. [name] that presents today for lipid follow up.  PMH is significant for significant coronary calcification on chest CT suggestive of CAD, dyspnea on exertion, HTN, HLD, DVT, and COPD. Carotid duplex in June 2017 showed stable 1-39% right ICA stenosis and stable 40-59% left ICA stenosis. Patient has a history of multiple statin intolerances and presents today for further lipid management.  Pt is intolerant to atorvastatin, pravastatin, rosuvastatin, and Zetia. She experienced myalgias within 3-4 weeks of statin use. Symptoms would go away within 3-4 days of drug discontinuation. Symptoms were reproducible with rechallenge. She was however started on Crestor 10mg  daily in Sept and did well for several weeks, but she has since developed symptoms below and discontinued at least 1 month ago.   She stopped her Crestor because her arms and legs were weak on the medication. She was unable to wash her hair. Her last dose of Crestor was about 1 month ago. She reports that her arms and legs have improved with stopping Crestor. She does inquire about taking Crestor every other day because her best friend is taking it this way and it has improved her cholesterol and she does not have muscle aching with it.   She reports that she discontinued the CLEAR trial in Sept and would never participate in another trial. She developed pain (especially stomach pains) with trial drug. She reports the pains improved when she dropped out the trial, but she was told several times that those pains were unrelated to the trial medication.   Current Medications: none Intolerances: atorvastatin 40mg  daily, pravastatin 20 and 40mg  daily, rosuvastatin 5mg  and 10mg  daily, Zetia 10mg  daily Risk Factors: CAD, HTN LDL goal: 70mg /dL  Diet: Breakfast - cereal. Lunch - sandwich. Dinner  - vegetables and meat. Drinks a lot of sweet tea every day - 1/2 gallon sweet tea per day.   Exercise: Exercise stationary bike - 10-15 minutes per day.  Family History: Mother with Alzheimer's disease, father with alcohol abuse, fatty liver disease, gastritis, and sudden death. Only cardiac history is in maternal grandfather - heart disease and CAD.  Social History: Former smoker - 1 PPD for 20 years. Quit 02/11/1992. Does not drink alcohol, denies illicit drug use.  Labs: 01/29/17:  TC 251, TG 195, HDL 55, LDL 157 - no cholesterol medication for >1 month 10/19/2015: TC 293, TG 218, HDL 55, LDL 194 (on no lipid lowering therapy)  Past Medical History:  Diagnosis Date  . Arthritis    oa  . Blood transfusion 1948  . Blood transfusion without reported diagnosis   . Breast cancer ([place]) 1994   bilateral / HX skin cancer  . Cataract    both eyes  . COPD (chronic obstructive pulmonary disease) ([place])   . Disc disease, degenerative, cervical    trouble turing head and neck at times  . Diverticulosis   . DVT (deep venous thrombosis) ([place] [facility]) 2002   left leg  . Fibromyalgia   . Hx of skin cancer, basal cell   . Hyperlipidemia   . Hypertension   . Measles as child  . Mumps age 69  . PONV (postoperative nausea and vomiting)   .  Sinus tachycardia   . Tubular adenoma of colon     Current Outpatient Medications on File Prior to Visit  Medication Sig Dispense Refill  . cholecalciferol (VITAMIN D) 1000 units tablet Take 1,000 Units by mouth daily.    Marland Kitchen losartan-hydrochlorothiazide (HYZAAR) 100-25 MG per tablet Take 0.5 tablets by mouth every morning.     . metoprolol tartrate (LOPRESSOR) 50 MG tablet take 1 tablet twice a day 180 tablet 2  . Multiple Vitamin (MULTIVITAMIN) tablet Take 1 tablet by mouth daily.    . Omega-3 Fatty Acids (FISH OIL) 1000 MG CAPS Take 2 capsules (2,000 mg total) by mouth daily. 180 capsule 3  . pantoprazole (PROTONIX) 40 MG tablet Take 1 tablet (40 mg total) by  mouth daily. 30 tablet 0  . vitamin E 400 UNIT capsule Take 400 Units by mouth daily.     No current facility-administered medications on file prior to visit.     Allergies  Allergen Reactions  . Aspirin Nausea Only  . Atorvastatin Other (See Comments)    Pt reports causes "muscle aches."  . Codeine Itching    Mood changes, can take percocet  . Ibuprofen Hives    Blisters   . Lisinopril Cough  . Rosuvastatin Other (See Comments)    Pt reports causes "muscle aches."    Assessment/Plan: Hyperlipidemia: LDL not at goal. Patient is willing to try Crestor 10mg  three times weekly to see if she is able to tolerate and will get LDL to goal. Will plan to follow up with her in 4 weeks to determine tolerability. If tolerating repeat labs in 8 weeks. IF not at goal or unable to tolerate will pursue PCSK9i. Of note previously PCSK9i were cost prohibitive for her, but discussed patient assistance today and she is willing to pursue if patient assistance can be obtained.    Thank you,  Lelan Pons. Patterson Hammersmith, [facility]  01/31/2017 4:37 PM

## 2017-01-31 NOTE — Progress Notes (Signed)
Why is she not taking it? I would strongly recommend that she doesn, if she has side effect we can switch to another agent

## 2017-02-01 ENCOUNTER — Ambulatory Visit (INDEPENDENT_AMBULATORY_CARE_PROVIDER_SITE_OTHER): Payer: Medicare Other | Admitting: Pharmacist

## 2017-02-01 DIAGNOSIS — E785 Hyperlipidemia, unspecified: Secondary | ICD-10-CM

## 2017-02-01 NOTE — Patient Instructions (Addendum)
Start Crestor (rosuvastatin) 10mg  three times a week (Mon, Wed, Fri)  If you are not tolerating the medication please call us at [phone].  If you are able to tolerate we will repeat labs in 8-12 weeks.   We will plan to pursue Praluent or Repatha if your LDL is not at goal or you are unable to tolerate the Crestor.    Cholesterol Cholesterol is a fat. Your body needs a small amount of cholesterol. Cholesterol (plaque) may build up in your blood vessels (arteries). That makes you more likely to have a heart attack or stroke. You cannot feel your cholesterol level. Having a blood test is the only way to find out if your level is high. Keep your test results. Work with your doctor to keep your cholesterol at a good level. What do the results mean?  Total cholesterol is how much cholesterol is in your blood.  LDL is bad cholesterol. This is the type that can build up. Try to have low LDL.  HDL is good cholesterol. It cleans your blood vessels and carries LDL away. Try to have high HDL.  Triglycerides are fat that the body can store or burn for energy. What are good levels of cholesterol?  Total cholesterol below 200.  LDL below 100 is good for people who have health risks. LDL below 70 is good for people who have very high risks.  HDL above 40 is good. It is best to have HDL of 60 or higher.  Triglycerides below 150. How can I lower my cholesterol? Diet Follow your diet program as told by your doctor.  Choose fish, white meat chicken, or [place] that is roasted or baked. Try not to eat red meat, fried foods, sausage, or lunch meats.  Eat lots of fresh fruits and vegetables.  Choose whole grains, beans, pasta, potatoes, and cereals.  Choose olive oil, corn oil, or canola oil. Only use small amounts.  Try not to eat butter, mayonnaise, shortening, or palm kernel oils.  Try not to eat foods with trans fats.  Choose low-fat or nonfat dairy foods. ? Drink skim or nonfat  milk. ? Eat low-fat or nonfat yogurt and cheeses. ? Try not to drink whole milk or cream. ? Try not to eat ice cream, egg yolks, or full-fat cheeses.  Healthy desserts include angel food cake, ginger snaps, animal crackers, hard candy, popsicles, and low-fat or nonfat frozen yogurt. Try not to eat pastries, cakes, pies, and cookies.  Exercise Follow your exercise program as told by your doctor.  Be more active. Try gardening, walking, and taking the stairs.  Ask your doctor about ways that you can be more active.  Medicine  Take over-the-counter and prescription medicines only as told by your doctor. This information is not intended to replace advice given to you by your health care provider. Make sure you discuss any questions you have with your health care provider. Document Released: 04/14/2008 Document Revised: 08/18/2015 Document Reviewed: 07/29/2015 Elsevier Interactive Patient Education  [employer].

## 2017-02-02 DIAGNOSIS — H25813 Combined forms of age-related cataract, bilateral: Secondary | ICD-10-CM | POA: Diagnosis not present

## 2017-02-02 DIAGNOSIS — H52223 Regular astigmatism, bilateral: Secondary | ICD-10-CM | POA: Diagnosis not present

## 2017-02-02 DIAGNOSIS — H5203 Hypermetropia, bilateral: Secondary | ICD-10-CM | POA: Diagnosis not present

## 2017-02-02 DIAGNOSIS — H04123 Dry eye syndrome of bilateral lacrimal glands: Secondary | ICD-10-CM | POA: Diagnosis not present

## 2017-02-21 DIAGNOSIS — M542 Cervicalgia: Secondary | ICD-10-CM | POA: Insufficient documentation

## 2017-02-21 DIAGNOSIS — M47812 Spondylosis without myelopathy or radiculopathy, cervical region: Secondary | ICD-10-CM | POA: Diagnosis not present

## 2017-02-21 DIAGNOSIS — M25512 Pain in left shoulder: Secondary | ICD-10-CM | POA: Diagnosis not present

## 2017-02-22 DIAGNOSIS — G5603 Carpal tunnel syndrome, bilateral upper limbs: Secondary | ICD-10-CM | POA: Diagnosis not present

## 2017-02-22 DIAGNOSIS — M791 Myalgia, unspecified site: Secondary | ICD-10-CM | POA: Diagnosis not present

## 2017-03-01 DIAGNOSIS — M25512 Pain in left shoulder: Secondary | ICD-10-CM | POA: Diagnosis not present

## 2017-03-01 DIAGNOSIS — M542 Cervicalgia: Secondary | ICD-10-CM | POA: Diagnosis not present

## 2017-03-02 ENCOUNTER — Telehealth: Payer: Self-pay | Admitting: Pharmacist

## 2017-03-02 DIAGNOSIS — E785 Hyperlipidemia, unspecified: Secondary | ICD-10-CM

## 2017-03-02 HISTORY — PX: CARPAL TUNNEL RELEASE: SHX101

## 2017-03-02 NOTE — Telephone Encounter (Signed)
Spoke with patient who reports that she thinks she is tolerating her Crestor well. Have placed orders for follow up lipid panel.

## 2017-03-06 DIAGNOSIS — M542 Cervicalgia: Secondary | ICD-10-CM | POA: Diagnosis not present

## 2017-03-06 DIAGNOSIS — M47812 Spondylosis without myelopathy or radiculopathy, cervical region: Secondary | ICD-10-CM | POA: Diagnosis not present

## 2017-03-06 DIAGNOSIS — M25512 Pain in left shoulder: Secondary | ICD-10-CM | POA: Diagnosis not present

## 2017-03-07 DIAGNOSIS — G5601 Carpal tunnel syndrome, right upper limb: Secondary | ICD-10-CM | POA: Diagnosis not present

## 2017-03-07 DIAGNOSIS — G5602 Carpal tunnel syndrome, left upper limb: Secondary | ICD-10-CM | POA: Diagnosis not present

## 2017-03-12 DIAGNOSIS — M7542 Impingement syndrome of left shoulder: Secondary | ICD-10-CM | POA: Diagnosis not present

## 2017-03-12 DIAGNOSIS — M75102 Unspecified rotator cuff tear or rupture of left shoulder, not specified as traumatic: Secondary | ICD-10-CM | POA: Diagnosis not present

## 2017-03-13 DIAGNOSIS — G5601 Carpal tunnel syndrome, right upper limb: Secondary | ICD-10-CM | POA: Diagnosis not present

## 2017-03-27 DIAGNOSIS — M25631 Stiffness of right wrist, not elsewhere classified: Secondary | ICD-10-CM | POA: Diagnosis not present

## 2017-04-05 DIAGNOSIS — M75102 Unspecified rotator cuff tear or rupture of left shoulder, not specified as traumatic: Secondary | ICD-10-CM | POA: Diagnosis not present

## 2017-04-05 DIAGNOSIS — M7542 Impingement syndrome of left shoulder: Secondary | ICD-10-CM | POA: Diagnosis not present

## 2017-04-09 DIAGNOSIS — M25631 Stiffness of right wrist, not elsewhere classified: Secondary | ICD-10-CM | POA: Diagnosis not present

## 2017-04-10 ENCOUNTER — Other Ambulatory Visit: Payer: Medicare Other

## 2017-04-10 DIAGNOSIS — E785 Hyperlipidemia, unspecified: Secondary | ICD-10-CM

## 2017-04-10 LAB — LIPID PANEL
Chol/HDL Ratio: 3.5 ratio (ref 0.0–4.4)
Cholesterol, Total: 196 mg/dL (ref 100–199)
HDL: 56 mg/dL (ref >39)
LDL Calculated: 102 mg/dL — ABNORMAL HIGH (ref 0–99)
Triglycerides: 192 mg/dL — ABNORMAL HIGH (ref 0–149)
VLDL Cholesterol Cal: 38 mg/dL (ref 5–40)

## 2017-04-10 LAB — HEPATIC FUNCTION PANEL
ALT: 9 IU/L (ref 0–32)
AST: 17 IU/L (ref 0–40)
Albumin: 4.3 g/dL (ref 3.5–4.8)
Alkaline Phosphatase: 72 IU/L (ref 39–117)
Bilirubin Total: 0.6 mg/dL (ref 0.0–1.2)
Bilirubin, Direct: 0.14 mg/dL (ref 0.00–0.40)
Total Protein: 6.7 g/dL (ref 6.0–8.5)

## 2017-04-11 ENCOUNTER — Telehealth: Payer: Self-pay | Admitting: Pharmacist

## 2017-04-11 DIAGNOSIS — G5603 Carpal tunnel syndrome, bilateral upper limbs: Secondary | ICD-10-CM | POA: Diagnosis not present

## 2017-04-11 DIAGNOSIS — I10 Essential (primary) hypertension: Secondary | ICD-10-CM | POA: Diagnosis not present

## 2017-04-11 MED ORDER — ROSUVASTATIN CALCIUM 10 MG PO TABS: ORAL_TABLET | ORAL | 3 refills | Status: DC

## 2017-04-11 NOTE — Telephone Encounter (Signed)
Spoke with patient about Lipid panel results from yesterday. LDL not at goal <70, but is improved from previous. She is tolerating rosuvastatin 10mg  three times weekly and would be willing to increase to 4-5  times weekly as tolerated (she needs new RX sent to new pharmacy and this has been done). Will call her again in 1-2 months to see how tolerating. If tolerating higher dose will plan to get lipid panel in 2 months. If not tolerating will consider Zetia vs. PCSK9i. Pt in agreement with this plan.

## 2017-04-24 DIAGNOSIS — M659 Synovitis and tenosynovitis, unspecified: Secondary | ICD-10-CM | POA: Diagnosis not present

## 2017-04-24 DIAGNOSIS — M7542 Impingement syndrome of left shoulder: Secondary | ICD-10-CM | POA: Diagnosis not present

## 2017-04-24 DIAGNOSIS — M75122 Complete rotator cuff tear or rupture of left shoulder, not specified as traumatic: Secondary | ICD-10-CM | POA: Diagnosis not present

## 2017-04-24 DIAGNOSIS — M19012 Primary osteoarthritis, left shoulder: Secondary | ICD-10-CM | POA: Diagnosis not present

## 2017-04-24 DIAGNOSIS — M7502 Adhesive capsulitis of left shoulder: Secondary | ICD-10-CM | POA: Diagnosis not present

## 2017-04-24 DIAGNOSIS — M24112 Other articular cartilage disorders, left shoulder: Secondary | ICD-10-CM | POA: Diagnosis not present

## 2017-04-24 DIAGNOSIS — G8918 Other acute postprocedural pain: Secondary | ICD-10-CM | POA: Diagnosis not present

## 2017-04-24 DIAGNOSIS — M7522 Bicipital tendinitis, left shoulder: Secondary | ICD-10-CM | POA: Diagnosis not present

## 2017-05-14 ENCOUNTER — Other Ambulatory Visit: Payer: Self-pay

## 2017-05-14 ENCOUNTER — Telehealth: Payer: Self-pay

## 2017-05-14 DIAGNOSIS — N289 Disorder of kidney and ureter, unspecified: Secondary | ICD-10-CM

## 2017-05-14 NOTE — Telephone Encounter (Signed)
Pt scheduled for MRI at [facility] 05/18/17@9am , pt to arrive here at 8:30am. Pt to be NPO after midnight. Pt to come prior to the appt to have labs drawn, order in epic. Pt aware.

## 2017-05-14 NOTE — Telephone Encounter (Signed)
-----   Message from Algernon Huxley, RN sent at 11/24/2016  9:47 AM EDT ----- Regarding: MRI Pt needs MRI in 6 mths

## 2017-05-15 ENCOUNTER — Other Ambulatory Visit (INDEPENDENT_AMBULATORY_CARE_PROVIDER_SITE_OTHER): Payer: Medicare Other

## 2017-05-15 DIAGNOSIS — N289 Disorder of kidney and ureter, unspecified: Secondary | ICD-10-CM

## 2017-05-15 LAB — BUN: BUN: 9 mg/dL (ref 6–23)

## 2017-05-15 LAB — CREATININE, SERUM: Creatinine, Ser: 0.61 mg/dL (ref 0.40–1.20)

## 2017-05-18 ENCOUNTER — Ambulatory Visit (HOSPITAL_COMMUNITY)
Admission: RE | Admit: 2017-05-18 | Discharge: 2017-05-18 | Disposition: A | Discharge status: Home / Self Care | Payer: Medicare Other | Source: Ambulatory Visit | Attending: Internal Medicine | Admitting: Internal Medicine

## 2017-05-18 ENCOUNTER — Telehealth: Payer: Self-pay | Admitting: Pharmacist

## 2017-05-18 DIAGNOSIS — I700 Atherosclerosis of aorta: Secondary | ICD-10-CM | POA: Diagnosis not present

## 2017-05-18 DIAGNOSIS — N289 Disorder of kidney and ureter, unspecified: Secondary | ICD-10-CM | POA: Diagnosis not present

## 2017-05-18 DIAGNOSIS — N2889 Other specified disorders of kidney and ureter: Secondary | ICD-10-CM | POA: Diagnosis not present

## 2017-05-18 DIAGNOSIS — N281 Cyst of kidney, acquired: Secondary | ICD-10-CM | POA: Diagnosis not present

## 2017-05-18 DIAGNOSIS — E782 Mixed hyperlipidemia: Secondary | ICD-10-CM

## 2017-05-18 MED ORDER — GADOBENATE DIMEGLUMINE 529 MG/ML IV SOLN
15.0000 mL | Freq: Once | INTRAVENOUS | Status: AC | PRN
  Administered 2017-05-18: 15 mL via INTRAVENOUS

## 2017-05-18 NOTE — Telephone Encounter (Signed)
LM to follow up on rosuvastatin tolerability as pre previous discussion. Will order labs for 2 month from now if tolerating higher dose of 4-5 times weekly.

## 2017-05-21 NOTE — Telephone Encounter (Signed)
Pt returned call to clinic and reports she is taking rosuvastatin 10mg  every other day and tolerating it well. She experienced myalgias and weakness with daily dosing. LDL previously 102 on rosuvastatin 10mg  3 days per week. She is now taking the rosuvastatin ~4 days a week and would like her cholesterol checked again. Scheduled lipids in 2 months.

## 2017-05-30 ENCOUNTER — Ambulatory Visit (HOSPITAL_COMMUNITY): Payer: Medicare Other | Admitting: Hematology

## 2017-06-04 ENCOUNTER — Telehealth: Payer: Self-pay | Admitting: Cardiology

## 2017-06-04 NOTE — Telephone Encounter (Signed)
Would recommend change to telmisartan/HCTZ 40/12.5mg  daily. Telmisartan tends to be a little more potent on pressures so have her monitor for 3-4 weeks after if able and call with changes.

## 2017-06-04 NOTE — Telephone Encounter (Signed)
New Message   Pt c/o medication issue:  1. Name of Medication: losartan-hydrochlorothiazide (HYZAAR) 100-25 MG per tablet  2. How are you currently taking this medication (dosage and times per day)? 100-25mg  per tablet   3. Are you having a reaction (difficulty breathing--STAT)?   4. What is your medication issue? Patient is is calling in reference to letter that she received about her prescription being recalled.

## 2017-06-04 NOTE — Telephone Encounter (Signed)
Left a message for the pt to call back to endorse med recommendations for recalled losartan-hctz.

## 2017-06-04 NOTE — Telephone Encounter (Signed)
Dr. [name] and Georgina Peer, this pts Hyzaar was discontinued due to recall.  Please advise on a different regimen.  Thanks!

## 2017-06-05 ENCOUNTER — Telehealth: Payer: Self-pay | Admitting: Cardiology

## 2017-06-05 MED ORDER — TELMISARTAN-HCTZ 40-12.5 MG PO TABS: 1.0000 | ORAL_TABLET | Freq: Every day | ORAL | 1 refills | Status: DC

## 2017-06-05 NOTE — Telephone Encounter (Signed)
Left a message for the pt to call back to endorse new med recommendations.  2nd attempt at trying to make contact with the pt.

## 2017-06-05 NOTE — Telephone Encounter (Signed)
Spoke with the pt and endorsed to her that per Tana Coast PharmD, we will d/c her recalled losartan, and start her on telmisartan/HCTZ 40/12.5 mg po daily.  Advised the pt to monitor her BP for a couple weeks, log this, and call with any changes.  Educated the pt on how to correctly take her BP.  Confirmed the pharmacy of choice with the pt.  Informed the pt that I will send in a 30 day supply of this med in for now, to make sure she tolerates this appropriately or not.  Pt verbalized understanding and agrees with this plan.

## 2017-06-05 NOTE — Telephone Encounter (Signed)
Pt c/o medication issue:  1. Name of Medication:  telmisartan-hydrochlorothiazide (MICARDIS HCT) 40-12.5 MG tablet    2. How are you currently taking this medication (dosage and times per day)? Take 1 tablet by mouth daily. 3. Are you having a reaction (difficulty breathing--STAT)? no  4. What is your medication issue? Side effects

## 2017-06-05 NOTE — Telephone Encounter (Signed)
Pt was calling to ask if there are any side effects she should be concerned about with taking med switch to telmisartan-HCTZ.  Informed the pt that per our Pharmacist, the big thing she should monitor is her pressures, for telmisartan tends to be a little more potent on pressures, so that's why she advised her to monitor her pressures for 3-4 weeks and call with any changes. Pt verbalized understanding and agrees with this plan.

## 2017-06-08 DIAGNOSIS — M25612 Stiffness of left shoulder, not elsewhere classified: Secondary | ICD-10-CM | POA: Diagnosis not present

## 2017-06-12 DIAGNOSIS — M75102 Unspecified rotator cuff tear or rupture of left shoulder, not specified as traumatic: Secondary | ICD-10-CM | POA: Diagnosis not present

## 2017-06-14 ENCOUNTER — Ambulatory Visit (HOSPITAL_COMMUNITY): Payer: Medicare Other | Admitting: Hematology

## 2017-06-14 DIAGNOSIS — M25612 Stiffness of left shoulder, not elsewhere classified: Secondary | ICD-10-CM | POA: Diagnosis not present

## 2017-06-19 DIAGNOSIS — M25612 Stiffness of left shoulder, not elsewhere classified: Secondary | ICD-10-CM | POA: Diagnosis not present

## 2017-06-20 DIAGNOSIS — N281 Cyst of kidney, acquired: Secondary | ICD-10-CM | POA: Diagnosis not present

## 2017-06-20 DIAGNOSIS — R351 Nocturia: Secondary | ICD-10-CM | POA: Diagnosis not present

## 2017-06-21 DIAGNOSIS — M25612 Stiffness of left shoulder, not elsewhere classified: Secondary | ICD-10-CM | POA: Diagnosis not present

## 2017-06-26 ENCOUNTER — Telehealth: Payer: Self-pay | Admitting: Pharmacist

## 2017-06-26 DIAGNOSIS — M25612 Stiffness of left shoulder, not elsewhere classified: Secondary | ICD-10-CM | POA: Diagnosis not present

## 2017-06-26 NOTE — Telephone Encounter (Signed)
Pt called to report that she is uable to continue with the rosuvastatin 10mg  three times a week. She states that she is having severe aches and has decided to stop the medication. She is interested in coming in to discuss other options. Have scheduled her for follow up in lipid clinic on 6/13.

## 2017-06-28 DIAGNOSIS — M25612 Stiffness of left shoulder, not elsewhere classified: Secondary | ICD-10-CM | POA: Diagnosis not present

## 2017-07-02 DIAGNOSIS — M7502 Adhesive capsulitis of left shoulder: Secondary | ICD-10-CM | POA: Diagnosis not present

## 2017-07-02 DIAGNOSIS — Z4789 Encounter for other orthopedic aftercare: Secondary | ICD-10-CM | POA: Diagnosis not present

## 2017-07-02 DIAGNOSIS — M542 Cervicalgia: Secondary | ICD-10-CM | POA: Diagnosis not present

## 2017-07-02 DIAGNOSIS — M25612 Stiffness of left shoulder, not elsewhere classified: Secondary | ICD-10-CM | POA: Diagnosis not present

## 2017-07-03 DIAGNOSIS — M25612 Stiffness of left shoulder, not elsewhere classified: Secondary | ICD-10-CM | POA: Diagnosis not present

## 2017-07-04 ENCOUNTER — Ambulatory Visit (HOSPITAL_COMMUNITY): Payer: Medicare Other | Admitting: Hematology

## 2017-07-05 DIAGNOSIS — M25612 Stiffness of left shoulder, not elsewhere classified: Secondary | ICD-10-CM | POA: Diagnosis not present

## 2017-07-07 ENCOUNTER — Other Ambulatory Visit: Payer: Self-pay | Admitting: Cardiology

## 2017-07-09 DIAGNOSIS — M75102 Unspecified rotator cuff tear or rupture of left shoulder, not specified as traumatic: Secondary | ICD-10-CM | POA: Diagnosis not present

## 2017-07-11 NOTE — Progress Notes (Signed)
Patient ID: Renee [name]                 DOB: 18-Apr-1940                    MRN: [id number]     HPI: TAMAIYA BUMP is a 77 y.o. female patient of Dr. [name] that presents today for lipid follow up.  PMH is significant for significant coronary calcification on chest CT suggestive of CAD, dyspnea on exertion, HTN, HLD, DVT, and COPD. Carotid duplex in June 2017 showed stable 1-39% right ICA stenosis and stable 40-59% left ICA stenosis. Patient has a history of multiple statin intolerances and presents today for further lipid management.  Pt is intolerant to atorvastatin, pravastatin, rosuvastatin, and Zetia. She experienced myalgias within 3-4 weeks of statin use. Symptoms would go away within 3-4 days of drug discontinuation. Symptoms were reproducible with rechallenge. She reports that she discontinued the CLEAR trial in Sept and would never participate in another trial. She developed pain (especially stomach pains) with trial drug. She reports the pains improved when she dropped out the trial, but she was told several times that those pains were unrelated to the trial medication. Most recently she was tried on rosuvastatin 10mg  three times weekly and was unable to tolerate.   She presents today for discussion of PCSK9i therapy (this has been cost prohibitive previously, but she was agreeable after unsuccessful trial on rosuvastatin doses a few times weekly). She states that she did well on the 3 times weekly rosuvastatin, but after a few weeks she developed mild pains that continued to get progressively worse. She states the pain became unbearable and she decided to stop her medication.   Current Medications: none Intolerances: atorvastatin 40mg  daily, pravastatin 20 and 40mg  daily, rosuvastatin 5mg  and 10mg  daily and 10mg  three times a week, Zetia 10mg  daily (muscle aches that improved off of medication) Risk Factors: CAD, HTN LDL goal: 70mg /dL  Diet: Breakfast - cereal. Lunch - sandwich.  Dinner - vegetables and meat. Drinks a lot of sweet tea every day - 1/2 gallon sweet tea per day.   Exercise: Exercise stationary bike - 10-15 minutes per day.  Family History: Mother with Alzheimer's disease, father with alcohol abuse, fatty liver disease, gastritis, and sudden death. Only cardiac history is in maternal grandfather - heart disease and CAD.  Social History: Former smoker - 1 PPD for 20 years. Quit 02/11/1992. Does not drink alcohol, denies illicit drug use.  Labs: 04/10/17:  TC 196, TG 192, HDL 56, LDL 102 (rosuvastatin 10mg  three times weekly) 01/29/17:  TC 251, TG 195, HDL 55, LDL 157 (no cholesterol medication for >1 month) 10/19/2015: TC 293, TG 218, HDL 55, LDL 194 (no lipid lowering therapy)  Past Medical History:  Diagnosis Date  . Arthritis    oa  . Blood transfusion 1948  . Blood transfusion without reported diagnosis   . Breast cancer ([place]) 1994   bilateral / HX skin cancer  . Cataract    both eyes  . COPD (chronic obstructive pulmonary disease) ([place])   . Disc disease, degenerative, cervical    trouble turing head and neck at times  . Diverticulosis   . DVT (deep venous thrombosis) ([place]) 2002   left leg  . Fibromyalgia   . Hx of skin cancer, basal cell   . Hyperlipidemia   . Hypertension   . Measles as child  . Mumps age 10  . PONV (postoperative nausea and vomiting)   .  Sinus tachycardia   . Tubular adenoma of colon     Current Outpatient Medications on File Prior to Visit  Medication Sig Dispense Refill  . cholecalciferol (VITAMIN D) 1000 units tablet Take 1,000 Units by mouth daily.    . metoprolol tartrate (LOPRESSOR) 50 MG tablet Take 1 tablet (50 mg total) by mouth 2 (two) times daily. Please keep upcoming appt in July for future refills. Thank you 180 tablet 0  . Multiple Vitamin (MULTIVITAMIN) tablet Take 1 tablet by mouth daily.    . Omega-3 Fatty Acids (FISH OIL) 1000 MG CAPS Take 2 capsules (2,000 mg total) by mouth daily. 180  capsule 3  . pantoprazole (PROTONIX) 40 MG tablet Take 1 tablet (40 mg total) by mouth daily. 30 tablet 0  . telmisartan-hydrochlorothiazide (MICARDIS HCT) 40-12.5 MG tablet Take 1 tablet by mouth daily. 30 tablet 1  . vitamin E 400 UNIT capsule Take 400 Units by mouth daily.     No current facility-administered medications on file prior to visit.     Allergies  Allergen Reactions  . Aspirin Nausea Only  . Atorvastatin Other (See Comments)    Pt reports causes "muscle aches."  . Codeine Itching    Mood changes, can take percocet  . Ibuprofen Hives    Blisters   . Lisinopril Cough  . Rosuvastatin Other (See Comments)    Pt reports causes "muscle aches."    Assessment/Plan: Hyperlipidemia: LDL not at goal. Pt has been unable to tolerate statin therapy and ezetimibe therapy due to muscle aches. Will pursue PCSK9i therapy as this is the only option to bring her LDL to goal. Discussed injection technique, risk/benefit, and cost obligations. We will contact her once approved through insurance. She should qualify for copay card for cost reduction.    Thank you,  Lelan Pons. Patterson Hammersmith, [facility]  07/11/2017 12:26 PM

## 2017-07-12 ENCOUNTER — Ambulatory Visit (INDEPENDENT_AMBULATORY_CARE_PROVIDER_SITE_OTHER): Payer: Medicare Other | Admitting: Pharmacist

## 2017-07-12 DIAGNOSIS — E785 Hyperlipidemia, unspecified: Secondary | ICD-10-CM | POA: Diagnosis not present

## 2017-07-12 DIAGNOSIS — M25612 Stiffness of left shoulder, not elsewhere classified: Secondary | ICD-10-CM | POA: Diagnosis not present

## 2017-07-12 MED ORDER — TELMISARTAN-HCTZ 40-12.5 MG PO TABS: 1.0000 | ORAL_TABLET | Freq: Every day | ORAL | 1 refills | Status: DC

## 2017-07-12 NOTE — Patient Instructions (Addendum)
We will send for coverage of PCSK9i (Repatha injection every 14 days).   Once you have the medication please call our office [phone].    Cholesterol Cholesterol is a fat. Your body needs a small amount of cholesterol. Cholesterol (plaque) may build up in your blood vessels (arteries). That makes you more likely to have a heart attack or stroke. You cannot feel your cholesterol level. Having a blood test is the only way to find out if your level is high. Keep your test results. Work with your doctor to keep your cholesterol at a good level. What do the results mean?  Total cholesterol is how much cholesterol is in your blood.  LDL is bad cholesterol. This is the type that can build up. Try to have low LDL.  HDL is good cholesterol. It cleans your blood vessels and carries LDL away. Try to have high HDL.  Triglycerides are fat that the body can store or burn for energy. What are good levels of cholesterol?  Total cholesterol below 200.  LDL below 100 is good for people who have health risks. LDL below 70 is good for people who have very high risks.  HDL above 40 is good. It is best to have HDL of 60 or higher.  Triglycerides below 150. How can I lower my cholesterol? Diet Follow your diet program as told by your doctor.  Choose fish, white meat chicken, or [place] that is roasted or baked. Try not to eat red meat, fried foods, sausage, or lunch meats.  Eat lots of fresh fruits and vegetables.  Choose whole grains, beans, pasta, potatoes, and cereals.  Choose olive oil, corn oil, or canola oil. Only use small amounts.  Try not to eat butter, mayonnaise, shortening, or palm kernel oils.  Try not to eat foods with trans fats.  Choose low-fat or nonfat dairy foods. ? Drink skim or nonfat milk. ? Eat low-fat or nonfat yogurt and cheeses. ? Try not to drink whole milk or cream. ? Try not to eat ice cream, egg yolks, or full-fat cheeses.  Healthy desserts include angel food  cake, ginger snaps, animal crackers, hard candy, popsicles, and low-fat or nonfat frozen yogurt. Try not to eat pastries, cakes, pies, and cookies.  Exercise Follow your exercise program as told by your doctor.  Be more active. Try gardening, walking, and taking the stairs.  Ask your doctor about ways that you can be more active.  Medicine  Take over-the-counter and prescription medicines only as told by your doctor. This information is not intended to replace advice given to you by your health care provider. Make sure you discuss any questions you have with your health care provider. Document Released: 04/14/2008 Document Revised: 08/18/2015 Document Reviewed: 07/29/2015 Elsevier Interactive Patient Education  [employer].

## 2017-07-13 ENCOUNTER — Encounter: Payer: Self-pay | Admitting: Pharmacist

## 2017-07-16 ENCOUNTER — Other Ambulatory Visit: Payer: Medicare Other | Admitting: *Deleted

## 2017-07-16 DIAGNOSIS — E785 Hyperlipidemia, unspecified: Secondary | ICD-10-CM

## 2017-07-16 DIAGNOSIS — M75102 Unspecified rotator cuff tear or rupture of left shoulder, not specified as traumatic: Secondary | ICD-10-CM | POA: Diagnosis not present

## 2017-07-16 LAB — LIPID PANEL
Chol/HDL Ratio: 4.6 ratio — ABNORMAL HIGH (ref 0.0–4.4)
Cholesterol, Total: 278 mg/dL — ABNORMAL HIGH (ref 100–199)
HDL: 61 mg/dL (ref >39)
LDL Calculated: 174 mg/dL — ABNORMAL HIGH (ref 0–99)
Triglycerides: 213 mg/dL — ABNORMAL HIGH (ref 0–149)
VLDL Cholesterol Cal: 43 mg/dL — ABNORMAL HIGH (ref 5–40)

## 2017-07-16 LAB — HEPATIC FUNCTION PANEL
ALT: 7 IU/L (ref 0–32)
AST: 15 IU/L (ref 0–40)
Albumin: 4.2 g/dL (ref 3.5–4.8)
Alkaline Phosphatase: 80 IU/L (ref 39–117)
Bilirubin Total: 0.5 mg/dL (ref 0.0–1.2)
Bilirubin, Direct: 0.13 mg/dL (ref 0.00–0.40)
Total Protein: 6.5 g/dL (ref 6.0–8.5)

## 2017-07-18 ENCOUNTER — Telehealth: Payer: Self-pay | Admitting: Pharmacist

## 2017-07-18 DIAGNOSIS — M5030 Other cervical disc degeneration, unspecified cervical region: Secondary | ICD-10-CM | POA: Diagnosis not present

## 2017-07-18 DIAGNOSIS — M4802 Spinal stenosis, cervical region: Secondary | ICD-10-CM | POA: Diagnosis not present

## 2017-07-18 DIAGNOSIS — M47812 Spondylosis without myelopathy or radiculopathy, cervical region: Secondary | ICD-10-CM | POA: Diagnosis not present

## 2017-07-18 NOTE — Telephone Encounter (Signed)
Called insurance to inquire about prior auth for Renee [name]. Per representative this has been denied. Pt is only one that can appeal denial.  Will plan to write letter on behalf of patient and have her come to office to sign. We will need to include office notes with appeals.

## 2017-07-19 DIAGNOSIS — L720 Epidermal cyst: Secondary | ICD-10-CM | POA: Diagnosis not present

## 2017-07-19 DIAGNOSIS — L821 Other seborrheic keratosis: Secondary | ICD-10-CM | POA: Diagnosis not present

## 2017-07-19 NOTE — Telephone Encounter (Signed)
Spoke with pt this AM who stated she would come to clinic this afternoon to sign appeals letter so that we can fax it to her insurance company. Pt did not show.

## 2017-07-23 ENCOUNTER — Other Ambulatory Visit: Payer: Medicare Other

## 2017-07-23 DIAGNOSIS — M47812 Spondylosis without myelopathy or radiculopathy, cervical region: Secondary | ICD-10-CM | POA: Diagnosis not present

## 2017-07-23 NOTE — Telephone Encounter (Signed)
This has been signed and faxed to insurance company. Await results of appeals.

## 2017-07-26 ENCOUNTER — Encounter: Payer: Self-pay | Admitting: Cardiology

## 2017-07-30 DIAGNOSIS — Z4889 Encounter for other specified surgical aftercare: Secondary | ICD-10-CM | POA: Diagnosis not present

## 2017-07-30 DIAGNOSIS — M7502 Adhesive capsulitis of left shoulder: Secondary | ICD-10-CM | POA: Diagnosis not present

## 2017-07-30 MED ORDER — EVOLOCUMAB 140 MG/ML ~~LOC~~ SOAJ: 1.0000 "pen " | SUBCUTANEOUS | 11 refills | Status: DC

## 2017-07-30 NOTE — Addendum Note (Signed)
Addended by: SUPPLE, MEGAN E on: 07/30/2017 04:35 PM   Modules accepted: Orders

## 2017-07-30 NOTE — Telephone Encounter (Signed)
Pt has been approved for Repatha. LMOM for pt to see which pharmacy she would like rx sent to.

## 2017-07-30 NOTE — Telephone Encounter (Signed)
Spoke with pt, rx sent to local pharmacy per pt request. She has activated her copay card and feels comfortable with injections at home. She will call clinic to schedule f/u lab work once she begins her injections.

## 2017-07-31 ENCOUNTER — Telehealth: Payer: Self-pay | Admitting: Pharmacist

## 2017-07-31 MED ORDER — EVOLOCUMAB 140 MG/ML ~~LOC~~ SOAJ: 1.0000 "pen " | SUBCUTANEOUS | 11 refills | Status: DC

## 2017-07-31 NOTE — Telephone Encounter (Signed)
Pt called office stating retail pharmacy won't take her Repatha copay card since she has [employer] FEP insurance. They informed her her copay would be $137 per month. Called pharmacy since copay card should still work with this insurance plan and when they ran the copay card, it kicked back stating prior authorization needed. This was already completed and approved after 2 appeals. Faxed over prior authorization approval to [place] and will follow up tomorrow.

## 2017-08-01 ENCOUNTER — Other Ambulatory Visit: Payer: Self-pay | Admitting: Pharmacist

## 2017-08-01 MED ORDER — EVOLOCUMAB 140 MG/ML ~~LOC~~ SOAJ: 1.0000 "pen " | SUBCUTANEOUS | 11 refills | Status: DC

## 2017-08-01 NOTE — Telephone Encounter (Signed)
Pt states [facility] contacted her and she will need to fill Repatha rx at [facility]. This has been sent in and pt has their contact information.

## 2017-08-03 ENCOUNTER — Encounter: Payer: Self-pay | Admitting: Cardiology

## 2017-08-03 ENCOUNTER — Ambulatory Visit (INDEPENDENT_AMBULATORY_CARE_PROVIDER_SITE_OTHER): Payer: Medicare Other | Admitting: Cardiology

## 2017-08-03 VITALS — BP 112/64 | HR 73 | Ht 65.0 in | Wt 160.8 lb

## 2017-08-03 DIAGNOSIS — I10 Essential (primary) hypertension: Secondary | ICD-10-CM

## 2017-08-03 DIAGNOSIS — E785 Hyperlipidemia, unspecified: Secondary | ICD-10-CM | POA: Diagnosis not present

## 2017-08-03 DIAGNOSIS — I6523 Occlusion and stenosis of bilateral carotid arteries: Secondary | ICD-10-CM | POA: Diagnosis not present

## 2017-08-03 MED ORDER — TELMISARTAN-HCTZ 40-12.5 MG PO TABS: 0.5000 | ORAL_TABLET | Freq: Every day | ORAL | 1 refills | Status: DC

## 2017-08-03 NOTE — Patient Instructions (Signed)
Medication Instructions:  Your physician recommends that you continue on your current medications as directed. Please refer to the Current Medication list given to you today.  Continue taking 1/2 tablet of telmisartan-hydrochlorothiazide (micardis hct) 40-12.5 mg tablet  Labwork: None ordered  Testing/Procedures: Your physician has requested that you have a carotid duplex. This test is an ultrasound of the carotid arteries in your neck. It looks at blood flow through these arteries that supply the brain with blood. Allow one hour for this exam. There are no restrictions or special instructions.  Follow-Up: Your physician wants you to follow-up in: 6 months with Dr. [name]. You will receive a reminder letter in the mail two months in advance. If you don't receive a letter, please call our office to schedule the follow-up appointment.   Any Other Special Instructions Will Be Listed Below (If Applicable).     If you need a refill on your cardiac medications before your next appointment, please call your pharmacy.

## 2017-08-03 NOTE — Progress Notes (Addendum)
Patient ID: Renee [name], female   DOB: 09/28/1940, 77 y.o.   MRN: [id number]    Patient Name: Renee [name] Date of Encounter: 08/03/2017  Primary Care Provider:  Deland Pretty, MD Primary Cardiologist:  Ena Dawley   Patient Profile  Chief complaint: One-year follow-up  Problem List   Past Medical History:  Diagnosis Date  . Arthritis    oa  . Blood transfusion 1948  . Blood transfusion without reported diagnosis   . Breast cancer ([place]) 1994   bilateral / HX skin cancer  . Cataract    both eyes  . COPD (chronic obstructive pulmonary disease) ([place])   . Disc disease, degenerative, cervical    trouble turing head and neck at times  . Diverticulosis   . DVT (deep venous thrombosis) ([place]) 2002   left leg  . Fibromyalgia   . Hx of skin cancer, basal cell   . Hyperlipidemia   . Hypertension   . Measles as child  . Mumps age 51  . PONV (postoperative nausea and vomiting)   . Sinus tachycardia   . Tubular adenoma of colon    Past Surgical History:  Procedure Laterality Date  . ABDOMINAL HYSTERECTOMY  1976   partial  . achilles tendon adn 2 bone spurs removed Left 05-2012  . [place]   lower  . bilateral mastectomy with tramflap reconstruction  1994  . COLONOSCOPY    . KNEE ARTHROSCOPY Right 2008  . KNEE SURGERY  2002   arthroscopy-bilateral  . MASTECTOMY     bilateral  . PARTIAL HYSTERECTOMY    . POLYPECTOMY    . 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 TOTAL KNEE ARTHROPLASTY Right 10/20/2013   Procedure: RIGHT TOTAL KNEE ARTHROPLASTY;  Surgeon: Gearlean Alf, MD;  Location: [facility];  Service: Orthopedics;  Laterality: Right;   Allergies  Allergies  Allergen Reactions  . Aspirin Nausea Only  . Atorvastatin Other (See Comments)    Pt reports causes "muscle aches."  . Codeine Itching    Mood changes, can  take percocet  . Ibuprofen Hives    Blisters   . Lisinopril Cough  . Rosuvastatin Other (See Comments)    Pt reports causes "muscle aches."   Chief complain: 1 year follow up  HPI  77 year old female with PMH is significant for significant coronary calcification on chest CT suggestive of CAD, dyspnea on exertion, HTN, HLD, DVT, and COPD. Carotid duplex in June 2017 showed stable 1-39% right ICA stenosis and stable 40-59% left ICA stenosis. Patient has a history of multiple statin intolerances and  Is followed at our lipid clinic.  07/14/2016 - this is one year follow-up, patient remains active and tries to walk a few blocks every day, it that she has no chest pain or shortness of breath, she also denies palpitation claudications, no lower extremity edema orthopnea or peripheral dyspnea dizziness. Her syncope. She was seen by our pharmacist Megan Supple in the lipid clinic and was started on Crestor 10 mg daily that she is tolerating well, her lipids were recently checked by Dr. Shelia Media, her LDL was 106, triglycerides 164.  08/03/2017, this is a 1 year follow-up, the patient remains active, walking daily, she continues to experience dyspnea on moderate exertion but it has not changed from prior.  She has only been taking half dose  of Micardis and her blood pressure has been well controlled.  She denies any chest pain, no lower extremity edema no orthopnea no proximal nocturnal dyspnea no palpitation or syncope.  She has been experiencing dizziness when she lays down in bed and turns her head.  She has been compliant with her medications, she was prescribed Praluent but has not been able to get it through her pharmacy yet.  Home Medications  Prior to Admission medications   Medication Sig Start Date End Date Taking? Authorizing Provider  DULoxetine (CYMBALTA) 60 MG capsule Take 60 mg by mouth every evening.   Yes Historical Provider, MD  ezetimibe (ZETIA) 10 MG tablet Take 10 mg by mouth every  evening.    Yes Historical Provider, MD  HYDROmorphone (DILAUDID) 2 MG tablet Take 1-2 tablets (2-4 mg total) by mouth every 3 (three) hours as needed. 10/16/12  Yes Alexzandrew Dara Lords, PA-C  losartan-hydrochlorothiazide (HYZAAR) 100-25 MG per tablet Take 0.5 tablets by mouth every morning.    Yes Historical Provider, MD  methocarbamol (ROBAXIN) 500 MG tablet Take 1 tablet (500 mg total) by mouth every 6 (six) hours as needed. 10/16/12  Yes Alexzandrew Dara Lords, PA-C  rivaroxaban (XARELTO) 10 MG TABS tablet Take 1 tablet (10 mg total) by mouth daily with breakfast. Take Xarelto for two and a half more weeks, then discontinue Xarelto. Once the patient has completed the Xarelto, they may resume the 81 mg Aspirin. 10/16/12  Yes Alexzandrew Dara Lords, PA-C  rosuvastatin (CRESTOR) 10 MG tablet Take 10 mg by mouth every evening.    Yes Historical Provider, MD    Family History  Family History  Problem Relation Age of Onset  . Alzheimer's disease Mother   . Alcohol abuse Father   . Sudden death Father   . Liver disease Father        fatty liver disease  . Other Father        gastritis  . Healthy Brother   . Alcohol abuse Daughter   . COPD Daughter   . Healthy Grandchild   . Heart disease Maternal Grandfather   . CAD Maternal Grandfather   . Sudden death Brother        at birth  . Unexplained death Daughter   . Alcohol abuse Daughter   . Healthy Sister   . Colon cancer Neg Hx   . Colon polyps Neg Hx   . Rectal cancer Neg Hx   . Stomach cancer Neg Hx     Social History  Social History   Socioeconomic History  . Marital status: Married    Spouse name: Not on file  . Number of children: 2  . Years of education: Not on file  . Highest education level: Not on file  Occupational History  . Occupation: RETIRED    Employer: RETIRED  Social Needs  . Financial resource strain: Not on file  . Food insecurity:    Worry: Not on file    Inability: Not on file  . Transportation needs:     Medical: Not on file    Non-medical: Not on file  Tobacco Use  . Smoking status: Former Smoker    Packs/day: 1.00    Years: 20.00    Pack years: 20.00    Types: Cigarettes    Last attempt to quit: 02/11/1992    Years since quitting: 25.4  . Smokeless tobacco: Never Used  Substance and Sexual Activity  . Alcohol use: Yes    Alcohol/week: 3.0 oz  Types: 5 Glasses of wine per week    Comment: occasional wine  . Drug use: No  . Sexual activity: Not Currently    Partners: Male  Lifestyle  . Physical activity:    Days per week: Not on file    Minutes per session: Not on file  . Stress: Not on file  Relationships  . Social connections:    Talks on phone: Not on file    Gets together: Not on file    Attends religious service: Not on file    Active member of club or organization: Not on file    Attends meetings of clubs or organizations: Not on file    Relationship status: Not on file  . Intimate partner violence:    Fear of current or ex partner: Not on file    Emotionally abused: Not on file    Physically abused: Not on file    Forced sexual activity: Not on file  Other Topics Concern  . Not on file  Social History Narrative  . Not on file     Review of Systems as per HPI General:  No chills, fever, night sweats or weight changes.  Cardiovascular:  No chest pain, dyspnea on exertion, edema, orthopnea, palpitations, paroxysmal nocturnal dyspnea. Dermatological: No rash, lesions/masses Respiratory: No cough, dyspnea Urologic: No hematuria, dysuria Abdominal:   No nausea, vomiting, diarrhea, bright red blood per rectum, melena, or hematemesis Neurologic:  No visual changes, wkns, changes in mental status. All other systems reviewed and are otherwise negative except as noted above.  Physical Exam  BP 124/64, heart rate 68, weight 146 pounds. General: Pleasant, NAD Psych: Normal affect. Neuro: Alert and oriented X 3. Moves all extremities spontaneously. HEENT:  Normal  Neck: Supple, bruit over right carotid artery, no JVD. Lungs:  Resp regular and unlabored, CTA. Heart: RRR no s3, s4, or murmurs. Abdomen: Soft, non-tender, non-distended, BS + x 4.  Extremities: No clubbing, cyanosis or edema. DP/PT/Radials 2+ and equal bilaterally.  Accessory Clinical Findings  ECG - performed today August 03, 2017 shows normal sinus rhythm incomplete right bundle branch block, unchanged from prior, this is personally reviewed.  LE venous Duplex  - No evidence of deep vein or superficial thrombosis involving the left lower extremity and right common femoral vein. An enlarged inguinal lymph node is noted on the left. - No evidence of Baker's cyst on the left. Other specific details can be found in the table(s) above. Prepared and Electronically Authenticated by  Elam Dutch [id number]  CT PE IMPRESSION: Negative for pulmonary embolism. No acute chest abnormalities.  Several punctate pulmonary nodules in both lungs. Nodules measure between 2-3 mm and are nonspecific. If the patient is at high risk for bronchogenic carcinoma, follow-up chest CT at 1year is recommended. If the patient is at low risk, no follow-up is needed. This recommendation follows the consensus statement: Guidelines for Management of Small Pulmonary Nodules Detected on CT Scans: A Statement from the [place] as published in Radiology 2005; 237:395-400.  Electronically Signed By: Markus Daft M.D. On: 11/19/2012 15:52  TTE 12/03/2012 Study Conclusions  - Left ventricle: The cavity size was normal. Wall thickness was increased in a pattern of moderate LVH. Systolic function was normal. The estimated ejection fraction was in the range of 60% to 65%. - Atrial septum: No defect or patent foramen ovale was identified. - Pericardium, extracardiac: A trivial pericardial effusion was identified.   Exercise nuclear stress test: 12/24/12  Quantitative  Gated Spect  Images  QGS EDV: 60 ml  QGS ESV: 17 ml  Impression  Exercise Capacity: Poor exercise capacity.  BP Response: Hypertensive blood pressure response.  Clinical Symptoms: Dyspnea  ECG Impression: PACs, ventricular couplet. No ischemic ST segment changes.  Comparison with Prior Nuclear Study: No significant change from previous study  Overall Impression: Normal stress nuclear study.  LV Ejection Fraction: 72%. LV Wall Motion: NL LV Function; NL Wall Motion  Loralie Champagne  12/24/2012  Exercise nuclear stress test: 07/2014  Nuclear stress EF: 83%.  Blood pressure demonstrated a hypertensive response to exercise.  Horizontal ST segment depression ST segment depression was noted during stress in the II, III, aVF, V4, V5 and V6 leads.  This is a low risk study.  The left ventricular ejection fraction is hyperdynamic (>65%).   Poor exercise tolerance achieving less than 5 METS with HTN respnse Positive ECG Perfusion images with breast attenuation no ischemia or infarct EF 83%    Assessment & Plan  1. Hyperlipidemia - significant with statin intolerance, I have personally reviewed her chest CT that was done in January of this year and she has diffuse calcifications in all 3 coronary arteries. She was seen by her pharmacist and started on Crestor 10 mg daily, her LDL decreased to 106, triglycerides remain mildly elevated at 164, I will add fish oil 2 g daily regimen.  However she could not tolerated and discontinued rosuvastatin, her LDL is back to 174, she was started on Praluent and get a discount card, her local pharmacy is noticed pending it but she has instruction to call specialty pharmacy in [place].  2. Hypertensive heart disease without CHF - she is euvolemic and blood pressure is controlled.  She is only using half of my Cardizem and is instructed to continue so.  3. Bilateral carotid disease- her carotid duplex showed stable 1-39% bilaterally.  She complains of  occasional left neck pain, she has no bruits today but will repeat her carotid ultrasound.  4.  Dizziness -seems to be related to inner ear problems, she is advised to restart doing vestibular exercises, she has extractions at home, we will also check carotid ultrasound.  5.  Coronary artery calcifications -they are addressing hyperlipidemia as above, she is asking if she should continue taking daily aspirin, since she has evidence for coronary calcification she should probably take it however she has had significant abdominal pain in the past with aspirin, will hold for now.  Follow up in 6 months.  Ena Dawley, MD 08/03/2017, 10:35 AM

## 2017-08-07 ENCOUNTER — Ambulatory Visit (HOSPITAL_COMMUNITY)
Admission: RE | Admit: 2017-08-07 | Discharge: 2017-08-07 | Disposition: A | Discharge status: Home / Self Care | Payer: Medicare Other | Source: Ambulatory Visit | Attending: Cardiovascular Disease | Admitting: Cardiovascular Disease

## 2017-08-07 DIAGNOSIS — E785 Hyperlipidemia, unspecified: Secondary | ICD-10-CM

## 2017-08-07 DIAGNOSIS — I6523 Occlusion and stenosis of bilateral carotid arteries: Secondary | ICD-10-CM | POA: Diagnosis not present

## 2017-08-07 DIAGNOSIS — I10 Essential (primary) hypertension: Secondary | ICD-10-CM | POA: Diagnosis not present

## 2017-08-08 ENCOUNTER — Telehealth: Payer: Self-pay | Admitting: Pharmacist

## 2017-08-08 DIAGNOSIS — E782 Mixed hyperlipidemia: Secondary | ICD-10-CM

## 2017-08-08 NOTE — Telephone Encounter (Signed)
Pt LM last week that [facility] needed more information on demographics. I called an spoke with the pharmacist at [facility] who stated that they were unable to fill Repatha (they are the equivalent of [facility] - they are unable to order that prescription due to cost and unable to ship to patient (they are located in [place], [place])). I called [facility] back and spoke with the pharmacist there and they stated that the patient could fill with them and copayment would be $133.62. He said that she was unable to use the copay card due to [facility] plan. Confirmed with Repatha representatives yesterday that her plan is unable to use copay card as it is supplemented by [employer]. I believe copay is high due to deductible, but I am unable to confirm with [facility].   LM with pt today to discuss options. If copay of $133 is cost effective will plan to fill through [facility]. If not will have her come to office to sign paperwork for patient assistance.

## 2017-08-09 NOTE — Telephone Encounter (Signed)
LM

## 2017-08-10 NOTE — Telephone Encounter (Signed)
Pt returned call to clinic and states that copay of $133 is cost prohibitive. 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 She should qualify based on reported income.

## 2017-08-13 ENCOUNTER — Telehealth (HOSPITAL_COMMUNITY): Payer: Self-pay | Admitting: Emergency Medicine

## 2017-08-13 ENCOUNTER — Encounter (HOSPITAL_COMMUNITY): Payer: Self-pay | Admitting: Internal Medicine

## 2017-08-13 ENCOUNTER — Ambulatory Visit (HOSPITAL_COMMUNITY): Payer: Medicare Other | Admitting: Hematology

## 2017-08-13 ENCOUNTER — Other Ambulatory Visit: Payer: Self-pay

## 2017-08-13 ENCOUNTER — Inpatient Hospital Stay (HOSPITAL_COMMUNITY): Payer: Medicare Other

## 2017-08-13 ENCOUNTER — Inpatient Hospital Stay (HOSPITAL_COMMUNITY): Payer: Medicare Other | Attending: Hematology | Admitting: Internal Medicine

## 2017-08-13 VITALS — BP 121/53 | HR 74 | Temp 98.2°F | Resp 16 | Ht 65.0 in | Wt 162.0 lb

## 2017-08-13 DIAGNOSIS — Z87891 Personal history of nicotine dependence: Secondary | ICD-10-CM

## 2017-08-13 DIAGNOSIS — I10 Essential (primary) hypertension: Secondary | ICD-10-CM | POA: Diagnosis not present

## 2017-08-13 DIAGNOSIS — R911 Solitary pulmonary nodule: Secondary | ICD-10-CM | POA: Diagnosis not present

## 2017-08-13 DIAGNOSIS — Z853 Personal history of malignant neoplasm of breast: Secondary | ICD-10-CM | POA: Diagnosis not present

## 2017-08-13 DIAGNOSIS — N281 Cyst of kidney, acquired: Secondary | ICD-10-CM

## 2017-08-13 DIAGNOSIS — N2889 Other specified disorders of kidney and ureter: Secondary | ICD-10-CM

## 2017-08-13 DIAGNOSIS — R918 Other nonspecific abnormal finding of lung field: Secondary | ICD-10-CM | POA: Diagnosis not present

## 2017-08-13 LAB — COMPREHENSIVE METABOLIC PANEL
ALT: 10 U/L (ref 0–44)
AST: 17 U/L (ref 15–41)
Albumin: 3.9 g/dL (ref 3.5–5.0)
Alkaline Phosphatase: 71 U/L (ref 38–126)
Anion gap: 8 (ref 5–15)
BUN: 13 mg/dL (ref 8–23)
CO2: 30 mmol/L (ref 22–32)
Calcium: 9.2 mg/dL (ref 8.9–10.3)
Chloride: 102 mmol/L (ref 98–111)
Creatinine, Ser: 0.7 mg/dL (ref 0.44–1.00)
GFR calc Af Amer: >60 mL/min (ref >60)
GFR calc non Af Amer: >60 mL/min (ref >60)
Glucose, Bld: 113 mg/dL — ABNORMAL HIGH (ref 70–99)
Potassium: 4.2 mmol/L (ref 3.5–5.1)
Sodium: 140 mmol/L (ref 135–145)
Total Bilirubin: 0.8 mg/dL (ref 0.3–1.2)
Total Protein: 7.4 g/dL (ref 6.5–8.1)

## 2017-08-13 LAB — CBC WITH DIFFERENTIAL/PLATELET
Basophils Absolute: 0 10*3/uL (ref 0.0–0.1)
Basophils Relative: 0 %
Eosinophils Absolute: 0 10*3/uL (ref 0.0–0.7)
Eosinophils Relative: 1 %
HCT: 41.2 % (ref 36.0–46.0)
Hemoglobin: 14.3 g/dL (ref 12.0–15.0)
Lymphocytes Relative: 28 %
Lymphs Abs: 2.3 10*3/uL (ref 0.7–4.0)
MCH: 33.3 pg (ref 26.0–34.0)
MCHC: 34.7 g/dL (ref 30.0–36.0)
MCV: 96 fL (ref 78.0–100.0)
Monocytes Absolute: 0.6 10*3/uL (ref 0.1–1.0)
Monocytes Relative: 7 %
Neutro Abs: 5 10*3/uL (ref 1.7–7.7)
Neutrophils Relative %: 64 %
Platelets: 223 10*3/uL (ref 150–400)
RBC: 4.29 MIL/uL (ref 3.87–5.11)
RDW: 13 % (ref 11.5–15.5)
WBC: 7.9 10*3/uL (ref 4.0–10.5)

## 2017-08-13 LAB — LACTATE DEHYDROGENASE: LDH: 169 U/L (ref 98–192)

## 2017-08-13 NOTE — Telephone Encounter (Signed)
Labs results reviewed by Dr. Walden Field. Advised to call patient to informed them of lab work Being WNL.

## 2017-08-13 NOTE — Progress Notes (Signed)
Referring physician:  Dr. Shelia Media  Diagnosis Multiple pulmonary nodules - Plan: CBC with Differential/Platelet, Comprehensive metabolic panel, Lactate dehydrogenase, CBC with Differential/Platelet, Comprehensive metabolic panel, Lactate dehydrogenase  Abnormal findings on diagnostic imaging of lung - Plan: CBC with Differential/Platelet, Comprehensive metabolic panel, Lactate dehydrogenase, CBC with Differential/Platelet, Comprehensive metabolic panel, Lactate dehydrogenase, CT CHEST W CONTRAST  Other specified disorders of kidney and ureter - Plan: CT Abdomen Pelvis W Contrast  History of bilateral breast cancer - Plan: CBC with Differential/Platelet, Comprehensive metabolic panel, Lactate dehydrogenase, CBC with Differential/Platelet, Comprehensive metabolic panel, Lactate dehydrogenase  Staging Cancer Staging No matching staging information was found for the patient.  Assessment and Plan: 1.  Bilateral breast cancer.  The patient has undergone bilateral mastectomy.  She denies any family history of breast cancer.  She denies having genetic testing done.  She denies being treated with chemotherapy, hormonal therapy or radiation.  She was diagnosed more than 25 years ago.   Labs done today 08/13/2017 showed normal CBC and chemistries.  No records are available for review.  I discussed with her based on the length of time from diagnosis she could either continue to follow-up at [facility] or her primary care physician.  She will RTC in 12/2017 for follow-up to go over scans.    2.  Pulmonary nodules.  Review of records shows that she had a CT of the chest without contrast done 01/26/2017 that showed multiple new 2 mm pulmonary nodules in the lung with follow-up imaging recommended in 1 year.   She is set up for CT of the chest for follow-up in December 2019.  She will return to clinic to go over the results.  3.  Renal lesion.  CT of the abdomen and pelvis done November 23, 2016  showed diverticulosis  without diverticulitis.  There was a new subcapsular lesion in the left kidney thought to represent a cyst but neoplasm could not be excluded.  She was recommended for MRI of the abdomen.  MRI of the abdomen was done on 05/18/2017 that showed no suspicious renal masses.  She had a 0.9 cm renal cyst.  The patient reports she has been seen by urology with no further work-up recommended.  She will be set up for repeat CT of the abdomen and pelvis in December 2019 for interval follow-up.  She will return to clinic to go over the results.  4.  Smoking.  Patient reports she has not smoked for 25 years.  She had evidence of pulmonary nodules noted on scans done in December 2018.  She has set up for repeat evaluation with CT of the chest in December 2019.  5.  Hypertension.  Blood pressure 121/53.  Patient should follow-up with PCP.    HPI: 77 year old female referred for consultation due to breast cancer and abnormal imaging.  Patient was reportedly diagnosed with breast cancer more than 25 years ago.  She was not treated with chemotherapy hormonal therapy or radiation.  She underwent bilateral mastectomy.  Review of records shows that she had a CT of the chest without contrast on 01/26/2017 that showed multiple new 2 mm pulmonary nodules in the lung with follow-up imaging recommended in 1 year.  She also had a CT of the abdomen and pelvis done November 23, 2016 that showed diverticulosis without diverticulitis.  There was a new subcapsular lesion in the left kidney thought to represent a cyst but neoplasm could not be excluded.  She was recommended for MRI of the abdomen.  MRI  of the abdomen was done on 05/18/2017 that showed no suspicious renal masses.  She had a 0.9 cm renal cyst.  The patient reports she has been seen by urology with no further work-up recommended.    Problem List Patient Active Problem List   Diagnosis Date Noted  . Carotid stenosis [I65.29] 07/23/2015  . Multiple pulmonary nodules [R91.8]  02/11/2015  . Essential hypertension [I10] 07/08/2013  . Preoperative evaluation to rule out surgical contraindication [Z01.818] 07/08/2013  . Sinus tachycardia [R00.0] 01/08/2013  . Difficulty in walking(719.7) [R26.2] 11/04/2012  . Stiffness of left knee [M25.662] 11/04/2012  . Left knee pain [M25.562] 11/04/2012  . Postoperative anemia due to acute blood loss [D62] 10/15/2012  . GERD (gastroesophageal reflux disease) [K21.9] 10/15/2012  . Unspecified essential hypertension [I10] 10/15/2012  . Hyperlipidemia [E78.5] 10/15/2012  . OA (osteoarthritis) of knee [M17.10] 10/14/2012  . Cough [R05] 02/14/2012  . Dyspnea [R06.00] 02/09/2012  . Chest pain [R07.9] 02/09/2012    Past Medical History Past Medical History:  Diagnosis Date  . Arthritis    oa  . Blood transfusion 1948  . Blood transfusion without reported diagnosis   . Breast cancer ([place]) 1994   bilateral / HX skin cancer  . Cataract    both eyes  . COPD (chronic obstructive pulmonary disease) ([place])   . Disc disease, degenerative, cervical    trouble turing head and neck at times  . Diverticulosis   . DVT (deep venous thrombosis) ([place]) 2002   left leg  . Fibromyalgia   . Hx of skin cancer, basal cell   . Hyperlipidemia   . Hypertension   . Measles as child  . Mumps age 70  . PONV (postoperative nausea and vomiting)   . Sinus tachycardia   . Tubular adenoma of colon     Past Surgical History Past Surgical History:  Procedure Laterality Date  . ABDOMINAL HYSTERECTOMY  1976   partial  . achilles tendon adn 2 bone spurs removed Left 05-2012  . [place]   lower  . bilateral mastectomy with tramflap reconstruction  1994  . COLONOSCOPY    . KNEE ARTHROSCOPY Right 2008  . KNEE SURGERY  2002   arthroscopy-bilateral  . MASTECTOMY     bilateral  . PARTIAL HYSTERECTOMY    . POLYPECTOMY    . TOTAL KNEE ARTHROPLASTY Left 10/14/2012   Procedure: LEFT TOTAL KNEE ARTHROPLASTY;  Surgeon: Gearlean Alf, MD;   Location: [facility];  Service: Orthopedics;  Laterality: Left;  . TOTAL KNEE ARTHROPLASTY Right 10/20/2013   Procedure: RIGHT TOTAL KNEE ARTHROPLASTY;  Surgeon: Gearlean Alf, MD;  Location: [facility];  Service: Orthopedics;  Laterality: Right;    Family History Family History  Problem Relation Age of Onset  . Alzheimer's disease Mother   . Alcohol abuse Father   . Sudden death Father   . Liver disease Father        fatty liver disease  . Other Father        gastritis  . Healthy Brother   . Alcohol abuse Daughter   . COPD Daughter   . Healthy Grandchild   . Heart disease Maternal Grandfather   . CAD Maternal Grandfather   . Sudden death Brother        at birth  . Unexplained death Daughter   . Alcohol abuse Daughter   . Healthy Sister   . Colon cancer Neg Hx   . Colon polyps Neg Hx   . Rectal  cancer Neg Hx   . Stomach cancer Neg Hx      Social History  reports that she quit smoking about 25 years ago. Her smoking use included cigarettes. She has a 20.00 pack-year smoking history. She has never used smokeless tobacco. She reports that she drinks about 3.0 oz of alcohol per week. She reports that she does not use drugs.  Medications  Current Outpatient Medications:  .  cholecalciferol (VITAMIN D) 1000 units tablet, Take 1,000 Units by mouth daily., Disp: , Rfl:  .  metoprolol tartrate (LOPRESSOR) 50 MG tablet, Take 1 tablet (50 mg total) by mouth 2 (two) times daily. Please keep upcoming appt in July for future refills. Thank you, Disp: 180 tablet, Rfl: 0 .  Multiple Vitamin (MULTIVITAMIN) tablet, Take 1 tablet by mouth daily., Disp: , Rfl:  .  Omega-3 Fatty Acids (FISH OIL) 1000 MG CAPS, Take 2 capsules (2,000 mg total) by mouth daily., Disp: 180 capsule, Rfl: 3 .  pantoprazole (PROTONIX) 40 MG tablet, Take 1 tablet (40 mg total) by mouth daily. (Patient taking differently: Take 40 mg by mouth daily as needed. ), Disp: 30 tablet, Rfl: 0 .  telmisartan-hydrochlorothiazide  (MICARDIS HCT) 40-12.5 MG tablet, Take 0.5 tablets by mouth daily., Disp: 45 tablet, Rfl: 1 .  vitamin E 400 UNIT capsule, Take 400 Units by mouth daily., Disp: , Rfl:   Allergies Aspirin; Atorvastatin; Codeine; Ibuprofen; Lisinopril; and Rosuvastatin  Review of Systems Review of Systems - Oncology ROS negative   Physical Exam  Vitals Wt Readings from Last 3 Encounters:  08/13/17 162 lb (73.5 kg)  08/03/17 160 lb 12.8 oz (72.9 kg)  11/20/16 165 lb 12.8 oz (75.2 kg)   Temp Readings from Last 3 Encounters:  08/13/17 98.2 F (36.8 C) (Oral)  07/06/15 97.8 F (36.6 C)  10/06/14 98.1 F (36.7 C) (Oral)   BP Readings from Last 3 Encounters:  08/13/17 (!) 121/53  08/03/17 112/64  11/20/16 114/62   Pulse Readings from Last 3 Encounters:  08/13/17 74  08/03/17 73  11/20/16 74   Constitutional: Well-developed, well-nourished, and in no distress.   HENT: Head: Normocephalic and atraumatic.  Mouth/Throat: No oropharyngeal exudate. Mucosa moist. Eyes: Pupils are equal, round, and reactive to light. Conjunctivae are normal. No scleral icterus.  Neck: Normal range of motion. Neck supple. No JVD present.  Cardiovascular: Normal rate, regular rhythm and normal heart sounds.  Exam reveals no gallop and no friction rub.   No murmur heard. Pulmonary/Chest: Effort normal and breath sounds normal. No respiratory distress. No wheezes.No rales.  Abdominal: Soft. Bowel sounds are normal. No distension. There is no tenderness. There is no guarding.  Musculoskeletal: No edema or tenderness.  Lymphadenopathy: No cervical, axillary or supraclavicular adenopathy.  Neurological: Alert and oriented to person, place, and time. No cranial nerve deficit.  Skin: Skin is warm and dry. No rash noted. No erythema. No pallor.  Psychiatric: Affect and judgment normal.  Bilateral breast exam:  Chaperone present.  Bilateral mastectomy with reconstruction noted.    Labs Appointment on 08/13/2017   Component Date Value Ref Range Status  . WBC 08/13/2017 7.9  4.0 - 10.5 K/uL Final  . RBC 08/13/2017 4.29  3.87 - 5.11 MIL/uL Final  . Hemoglobin 08/13/2017 14.3  12.0 - 15.0 g/dL Final  . HCT 08/13/2017 41.2  36.0 - 46.0 % Final  . MCV 08/13/2017 96.0  78.0 - 100.0 fL Final  . MCH 08/13/2017 33.3  26.0 - 34.0 pg Final  .  MCHC 08/13/2017 34.7  30.0 - 36.0 g/dL Final  . RDW 08/13/2017 13.0  11.5 - 15.5 % Final  . Platelets 08/13/2017 223  150 - 400 K/uL Final  . Neutrophils Relative % 08/13/2017 64  % Final  . Neutro Abs 08/13/2017 5.0  1.7 - 7.7 K/uL Final  . Lymphocytes Relative 08/13/2017 28  % Final  . Lymphs Abs 08/13/2017 2.3  0.7 - 4.0 K/uL Final  . Monocytes Relative 08/13/2017 7  % Final  . Monocytes Absolute 08/13/2017 0.6  0.1 - 1.0 K/uL Final  . Eosinophils Relative 08/13/2017 1  % Final  . Eosinophils Absolute 08/13/2017 0.0  0.0 - 0.7 K/uL Final  . Basophils Relative 08/13/2017 0  % Final  . Basophils Absolute 08/13/2017 0.0  0.0 - 0.1 K/uL Final   Performed at [facility], [address] [place], [place] [address]  . Sodium 08/13/2017 140  135 - 145 mmol/L Final  . Potassium 08/13/2017 4.2  3.5 - 5.1 mmol/L Final  . Chloride 08/13/2017 102  98 - 111 mmol/L Final   Please note change in reference range.  . CO2 08/13/2017 30  22 - 32 mmol/L Final  . Glucose, Bld 08/13/2017 113* 70 - 99 mg/dL Final   Please note change in reference range.  . BUN 08/13/2017 13  8 - 23 mg/dL Final   Please note change in reference range.  . Creatinine, Ser 08/13/2017 0.70  0.44 - 1.00 mg/dL Final  . Calcium 08/13/2017 9.2  8.9 - 10.3 mg/dL Final  . Total Protein 08/13/2017 7.4  6.5 - 8.1 g/dL Final  . Albumin 08/13/2017 3.9  3.5 - 5.0 g/dL Final  . AST 08/13/2017 17  15 - 41 U/L Final  . ALT 08/13/2017 10  0 - 44 U/L Final   Please note change in reference range.  . Alkaline Phosphatase 08/13/2017 71  38 - 126 U/L Final  . Total Bilirubin 08/13/2017 0.8  0.3 - 1.2 mg/dL Final  . GFR  calc non Af Amer 08/13/2017 >60  >60 mL/min Final  . GFR calc Af Amer 08/13/2017 >60  >60 mL/min Final   Comment: (NOTE) The eGFR has been calculated using the CKD EPI equation. This calculation has not been validated in all clinical situations. eGFR's persistently <60 mL/min signify possible Chronic Kidney Disease.   Georgiann Hahn gap 08/13/2017 8  5 - 15 Final   Performed at [facility], [address] [place], [place] [address]  . LDH 08/13/2017 169  98 - 192 U/L Final   Performed at [facility], [address], [place], [place] [address]     Pathology Orders Placed This Encounter  Procedures  . CT CHEST W CONTRAST    Standing Status:   Future    Standing Expiration Date:   08/13/2018    Order Specific Question:   If indicated for the ordered procedure, I authorize the administration of contrast media per Radiology protocol    Answer:   Yes    Order Specific Question:   Preferred imaging location?    Answer:   [facility]    Order Specific Question:   Radiology Contrast Protocol - do NOT remove file path    Answer:   \\[email or web address]  . CT Abdomen Pelvis W Contrast    Standing Status:   Future    Standing Expiration Date:   08/13/2018    Order Specific Question:   If indicated for the ordered procedure, I authorize the administration of  contrast media per Radiology protocol    Answer:   Yes    Order Specific Question:   Preferred imaging location?    Answer:   [facility]    Order Specific Question:   Is Oral Contrast requested for this exam?    Answer:   Yes, Per Radiology protocol    Order Specific Question:   Radiology Contrast Protocol - do NOT remove file path    Answer:   \\[email or web address]  . CBC with Differential/Platelet    Standing Status:   Future    Number of Occurrences:   1    Standing Expiration Date:   08/14/2018  . Comprehensive metabolic panel    Standing Status:   Future    Number of  Occurrences:   1    Standing Expiration Date:   08/14/2018  . Lactate dehydrogenase    Standing Status:   Future    Number of Occurrences:   1    Standing Expiration Date:   08/14/2018  . CBC with Differential/Platelet    Standing Status:   Future    Standing Expiration Date:   08/14/2019  . Comprehensive metabolic panel    Standing Status:   Future    Standing Expiration Date:   08/14/2019  . Lactate dehydrogenase    Standing Status:   Future    Standing Expiration Date:   08/14/2019       Zoila Shutter MD

## 2017-08-13 NOTE — Patient Instructions (Signed)
[facility] at [facility] Discharge Instructions  Today you saw Dr. Higgs.    Thank you for choosing [facility] at [facility] to provide your oncology and hematology care.  To afford each patient quality time with our provider, please arrive at least 15 minutes before your scheduled appointment time.   If you have a lab appointment with the Cancer Center please come in thru the  Main Entrance and check in at the main information desk  You need to re-schedule your appointment should you arrive 10 or more minutes late.  We strive to give you quality time with our providers, and arriving late affects you and other patients whose appointments are after yours.  Also, if you no show three or more times for appointments you may be dismissed from the clinic at the providers discretion.     Again, thank you for choosing [facility].  Our hope is that these requests will decrease the amount of time that you wait before being seen by our physicians.       _____________________________________________________________  Should you have questions after your visit to [facility], please contact our office at [phone] between the hours of 8:30 a.m. and 4:30 p.m.  Voicemails left after 4:30 p.m. will not be returned until the following business day.  For prescription refill requests, have your pharmacy contact our office.       Resources For Cancer Patients and their Caregivers ? American Cancer Society: Can assist with transportation, wigs, general needs, runs Look Good Feel Better.        1-888-227-6333 ? Cancer Care: Provides financial assistance, online support groups, medication/co-pay assistance.  1-800-813-HOPE (4673) ? [facility] Assists [place] cancer patients and their families through emotional , educational and financial support.  336-427-4357 ? [place] DSS Where to apply for  food stamps, Medicaid and utility assistance. 336-342-1394 ? [facility]: Transportation to medical appointments. 336-347-2287 ? Social Security Administration: May apply for disability if have a Stage IV cancer. 336-342-7796 1-800-772-1213 ? [place] Aging, Disability and Transit Services: Assists with nutrition, care and transit needs. 336-349-2343  Cancer Center Support Programs:   > Cancer Support Group  2nd Tuesday of the month 1pm-2pm, Journey Room   > Creative Journey  3rd Tuesday of the month 1130am-1pm, Journey Room    

## 2017-08-13 NOTE — Telephone Encounter (Signed)
Pt dropped off Safety Net Application today. Faxed to [employer] 08/13/17.

## 2017-08-21 NOTE — Telephone Encounter (Signed)
Received denial from [employer] as the patient has Pharmacist, community.   Called [employer] to discuss that was unable to use copay card due to [employer]. They state that the patient does qualify for the copay card. They advise pt to call and activate card over phone. She will need to call [phone] to activate. They can override since patient DOES qualify for copay reduction. This should bring copayment to around $5 per month.   Spoke with patient and made aware of above. She states understanding and will call number provided to activate card. Advised she call with any additional issues.

## 2017-08-22 NOTE — Telephone Encounter (Signed)
Pt called clinic - [facility] was finally able to process the copay card and pt received her Repatha yesterday for $5. She successfully administered her first shot yesterday without issues. Scheduled f/u lab work in 6 weeks to assess efficacy.

## 2017-08-22 NOTE — Addendum Note (Signed)
Addended by: SUPPLE, MEGAN E on: 08/22/2017 01:23 PM   Modules accepted: Orders

## 2017-09-24 ENCOUNTER — Other Ambulatory Visit: Payer: Medicare Other | Admitting: *Deleted

## 2017-09-24 DIAGNOSIS — E782 Mixed hyperlipidemia: Secondary | ICD-10-CM

## 2017-09-24 LAB — LIPID PANEL
Chol/HDL Ratio: 2.7 ratio (ref 0.0–4.4)
Cholesterol, Total: 162 mg/dL (ref 100–199)
HDL: 61 mg/dL (ref >39)
LDL Calculated: 64 mg/dL (ref 0–99)
Triglycerides: 185 mg/dL — ABNORMAL HIGH (ref 0–149)
VLDL Cholesterol Cal: 37 mg/dL (ref 5–40)

## 2017-09-24 LAB — HEPATIC FUNCTION PANEL
ALT: 10 IU/L (ref 0–32)
AST: 18 IU/L (ref 0–40)
Albumin: 4.3 g/dL (ref 3.5–4.8)
Alkaline Phosphatase: 77 IU/L (ref 39–117)
Bilirubin Total: 0.8 mg/dL (ref 0.0–1.2)
Bilirubin, Direct: 0.22 mg/dL (ref 0.00–0.40)
Total Protein: 6.6 g/dL (ref 6.0–8.5)

## 2017-10-06 ENCOUNTER — Other Ambulatory Visit: Payer: Self-pay | Admitting: Cardiology

## 2017-10-08 DIAGNOSIS — M545 Low back pain, unspecified: Secondary | ICD-10-CM | POA: Diagnosis present

## 2017-10-08 DIAGNOSIS — M47817 Spondylosis without myelopathy or radiculopathy, lumbosacral region: Secondary | ICD-10-CM | POA: Diagnosis not present

## 2017-10-26 DIAGNOSIS — Z23 Encounter for immunization: Secondary | ICD-10-CM | POA: Diagnosis not present

## 2017-11-19 ENCOUNTER — Telehealth: Payer: Self-pay | Admitting: Pharmacist

## 2017-11-19 NOTE — Telephone Encounter (Signed)
Pt called to clinic today to report that she aches all over in her muscles and joints. She states she tried to convince herself that this was not the Repatha, but this is the only thing that has changed. She will remain off the [place]. She took her last injection on Tuesday of last week. She is willing to challenge with Praluent to see if this will work to control her cholesterol.  Will send for coverage of Praluent through insurance and notify pt when approved. She will delay start until she feels better off Repatha.

## 2017-11-19 NOTE — Telephone Encounter (Signed)
Pt called back and she reports that she does not wish to start Praluent. She would prefer to try Livalo 1mg  every other day. She will start this once she feels better off her Repatha therapy.   Will plan to follow up in 3-4 weeks to determine tolerability.

## 2017-11-22 ENCOUNTER — Ambulatory Visit (INDEPENDENT_AMBULATORY_CARE_PROVIDER_SITE_OTHER): Payer: Medicare Other | Admitting: Otolaryngology

## 2017-11-27 ENCOUNTER — Telehealth: Payer: Self-pay | Admitting: Pharmacist

## 2017-11-27 DIAGNOSIS — E782 Mixed hyperlipidemia: Secondary | ICD-10-CM

## 2017-11-27 MED ORDER — ALIROCUMAB 75 MG/ML ~~LOC~~ SOPN: 75.0000 mg | PEN_INJECTOR | SUBCUTANEOUS | 11 refills | Status: DC

## 2017-11-27 NOTE — Telephone Encounter (Signed)
Pt decided previously not to challenge with Praluent. She will continue Livalo for now. PA is approved for Praluent should she decide to try it.

## 2017-11-27 NOTE — Telephone Encounter (Signed)
Pt approved for Praluent through insurance.  Will need to call pt today to have activate copay card for cost reduction. RX already sent to pharmacy

## 2017-12-07 MED ORDER — PITAVASTATIN CALCIUM 2 MG PO TABS: ORAL_TABLET | ORAL | 11 refills | Status: DC

## 2017-12-07 NOTE — Addendum Note (Signed)
Addended by: SUPPLE, MEGAN E on: 12/07/2017 01:36 PM   Modules accepted: Orders

## 2017-12-07 NOTE — Telephone Encounter (Signed)
Spoke with pt - she has been taking Livalo 1mg  every day and has been tolerating therapy well. She asks for samples however we only have the 4mg  strength currently. She states 1 month supply costs $36 at her pharmacy. Will send in rx for 2mg  dose and have pt continue cutting her tablets in half and will plan to recheck lipids in December. Can increase to the full 2mg  dose at that time if further lipid lowering is required.

## 2018-01-02 ENCOUNTER — Inpatient Hospital Stay (HOSPITAL_COMMUNITY): Payer: Medicare Other | Attending: Hematology

## 2018-01-02 DIAGNOSIS — Z87891 Personal history of nicotine dependence: Secondary | ICD-10-CM | POA: Insufficient documentation

## 2018-01-02 DIAGNOSIS — I10 Essential (primary) hypertension: Secondary | ICD-10-CM | POA: Diagnosis not present

## 2018-01-02 DIAGNOSIS — Z853 Personal history of malignant neoplasm of breast: Secondary | ICD-10-CM

## 2018-01-02 DIAGNOSIS — Z9013 Acquired absence of bilateral breasts and nipples: Secondary | ICD-10-CM | POA: Diagnosis not present

## 2018-01-02 DIAGNOSIS — R634 Abnormal weight loss: Secondary | ICD-10-CM | POA: Diagnosis not present

## 2018-01-02 DIAGNOSIS — R918 Other nonspecific abnormal finding of lung field: Secondary | ICD-10-CM | POA: Insufficient documentation

## 2018-01-02 LAB — COMPREHENSIVE METABOLIC PANEL
ALT: 11 U/L (ref 0–44)
AST: 23 U/L (ref 15–41)
Albumin: 4.2 g/dL (ref 3.5–5.0)
Alkaline Phosphatase: 77 U/L (ref 38–126)
Anion gap: 8 (ref 5–15)
BUN: 13 mg/dL (ref 8–23)
CO2: 28 mmol/L (ref 22–32)
Calcium: 9.3 mg/dL (ref 8.9–10.3)
Chloride: 101 mmol/L (ref 98–111)
Creatinine, Ser: 0.64 mg/dL (ref 0.44–1.00)
GFR calc Af Amer: >60 mL/min (ref >60)
GFR calc non Af Amer: >60 mL/min (ref >60)
Glucose, Bld: 114 mg/dL — ABNORMAL HIGH (ref 70–99)
Potassium: 4.1 mmol/L (ref 3.5–5.1)
Sodium: 137 mmol/L (ref 135–145)
Total Bilirubin: 0.9 mg/dL (ref 0.3–1.2)
Total Protein: 7.3 g/dL (ref 6.5–8.1)

## 2018-01-02 LAB — CBC WITH DIFFERENTIAL/PLATELET
Abs Immature Granulocytes: 0.01 10*3/uL (ref 0.00–0.07)
Basophils Absolute: 0 10*3/uL (ref 0.0–0.1)
Basophils Relative: 0 %
Eosinophils Absolute: 0.1 10*3/uL (ref 0.0–0.5)
Eosinophils Relative: 1 %
HCT: 42.8 % (ref 36.0–46.0)
Hemoglobin: 14.2 g/dL (ref 12.0–15.0)
Immature Granulocytes: 0 %
Lymphocytes Relative: 36 %
Lymphs Abs: 1.7 10*3/uL (ref 0.7–4.0)
MCH: 32.1 pg (ref 26.0–34.0)
MCHC: 33.2 g/dL (ref 30.0–36.0)
MCV: 96.6 fL (ref 80.0–100.0)
Monocytes Absolute: 0.4 10*3/uL (ref 0.1–1.0)
Monocytes Relative: 7 %
Neutro Abs: 2.7 10*3/uL (ref 1.7–7.7)
Neutrophils Relative %: 56 %
Platelets: 252 10*3/uL (ref 150–400)
RBC: 4.43 MIL/uL (ref 3.87–5.11)
RDW: 12.4 % (ref 11.5–15.5)
WBC: 4.9 10*3/uL (ref 4.0–10.5)
nRBC: 0 % (ref 0.0–0.2)

## 2018-01-02 LAB — LACTATE DEHYDROGENASE: LDH: 153 U/L (ref 98–192)

## 2018-01-04 ENCOUNTER — Ambulatory Visit (HOSPITAL_COMMUNITY)
Admission: RE | Admit: 2018-01-04 | Discharge: 2018-01-04 | Disposition: A | Discharge status: Home / Self Care | Payer: Medicare Other | Source: Ambulatory Visit | Attending: Internal Medicine | Admitting: Internal Medicine

## 2018-01-04 DIAGNOSIS — N2889 Other specified disorders of kidney and ureter: Secondary | ICD-10-CM | POA: Insufficient documentation

## 2018-01-04 DIAGNOSIS — N289 Disorder of kidney and ureter, unspecified: Secondary | ICD-10-CM | POA: Diagnosis not present

## 2018-01-04 DIAGNOSIS — R918 Other nonspecific abnormal finding of lung field: Secondary | ICD-10-CM | POA: Diagnosis not present

## 2018-01-04 MED ORDER — IOPAMIDOL (ISOVUE-370) INJECTION 76%: 100.0000 mL | Freq: Once | INTRAVENOUS | Status: DC | PRN

## 2018-01-04 MED ORDER — IOPAMIDOL (ISOVUE-300) INJECTION 61%
100.0000 mL | Freq: Once | INTRAVENOUS | Status: AC | PRN
  Administered 2018-01-04: 100 mL via INTRAVENOUS

## 2018-01-07 ENCOUNTER — Other Ambulatory Visit (HOSPITAL_COMMUNITY): Payer: Medicare Other

## 2018-01-08 ENCOUNTER — Other Ambulatory Visit: Payer: Self-pay

## 2018-01-08 ENCOUNTER — Encounter (HOSPITAL_COMMUNITY): Payer: Self-pay | Admitting: Internal Medicine

## 2018-01-08 ENCOUNTER — Inpatient Hospital Stay (HOSPITAL_BASED_OUTPATIENT_CLINIC_OR_DEPARTMENT_OTHER): Payer: Medicare Other | Admitting: Internal Medicine

## 2018-01-08 VITALS — BP 99/55 | HR 80 | Temp 98.6°F | Resp 16 | Wt 145.7 lb

## 2018-01-08 DIAGNOSIS — Z87891 Personal history of nicotine dependence: Secondary | ICD-10-CM | POA: Diagnosis not present

## 2018-01-08 DIAGNOSIS — Z853 Personal history of malignant neoplasm of breast: Secondary | ICD-10-CM | POA: Diagnosis not present

## 2018-01-08 DIAGNOSIS — I10 Essential (primary) hypertension: Secondary | ICD-10-CM | POA: Diagnosis not present

## 2018-01-08 DIAGNOSIS — R918 Other nonspecific abnormal finding of lung field: Secondary | ICD-10-CM

## 2018-01-08 DIAGNOSIS — R634 Abnormal weight loss: Secondary | ICD-10-CM | POA: Diagnosis not present

## 2018-01-08 DIAGNOSIS — Z9013 Acquired absence of bilateral breasts and nipples: Secondary | ICD-10-CM | POA: Diagnosis not present

## 2018-01-08 DIAGNOSIS — N281 Cyst of kidney, acquired: Secondary | ICD-10-CM | POA: Diagnosis not present

## 2018-01-08 NOTE — Patient Instructions (Signed)
[facility] at [facility]  Discharge Instructions: You saw Dr. Higgs today                               _______________________________________________________________  Thank you for choosing [facility] at [facility] to provide your oncology and hematology care.  To afford each patient quality time with our providers, please arrive at least 15 minutes before your scheduled appointment.  You need to re-schedule your appointment if you arrive 10 or more minutes late.  We strive to give you quality time with our providers, and arriving late affects you and other patients whose appointments are after yours.  Also, if you no show three or more times for appointments you may be dismissed from the clinic.  Again, thank you for choosing [facility] at [facility]. Our hope is that these requests will allow you access to exceptional care and in a timely manner. _______________________________________________________________  If you have questions after your visit, please contact our office at [phone] between the hours of 8:30 a.m. and 5:00 p.m. Voicemails left after 4:30 p.m. will not be returned until the following business day. _______________________________________________________________  For prescription refill requests, have your pharmacy contact our office. _______________________________________________________________  Recommendations made by the consultant and any test results will be sent to your referring physician. _______________________________________________________________ 

## 2018-01-08 NOTE — Progress Notes (Signed)
Diagnosis Multiple pulmonary nodules - Plan: CBC with Differential/Platelet, Comprehensive metabolic panel, Lactate dehydrogenase  Abnormal findings on diagnostic imaging of lung - Plan: CBC with Differential/Platelet, Comprehensive metabolic panel, Lactate dehydrogenase, CT CHEST W CONTRAST  Abnormal weight loss - Plan: CBC with Differential/Platelet, Comprehensive metabolic panel, Lactate dehydrogenase, CT ABDOMEN PELVIS W CONTRAST  History of bilateral breast cancer - Plan: CBC with Differential/Platelet, Comprehensive metabolic panel, Lactate dehydrogenase  Staging Cancer Staging No matching staging information was found for the patient.  Assessment and Plan;  1.  Bilateral breast cancer.  The patient has undergone bilateral mastectomy.  She denies any family history of breast cancer.  She denies having genetic testing done.  She denies being treated with chemotherapy, hormonal therapy or radiation.  She was diagnosed more than 25 years ago.     Labs done 01/02/2018 reviewed and showed WBC 4.9 HB 14.2 plts 252,000.  Chemistries WNL wit K+ 4.1 Cr 0.64 and normal LFTs.    CT CAP done 01/04/2018 reviewed and showed  IMPRESSION: 1. Stable scattered sub 4 mm pulmonary nodules, likely benign given their stability since 2017. 2. 8 mm exophytic left renal lesion consistent with a benign hyperdense/hemorrhagic cyst. 3. No mediastinal or hilar mass or adenopathy. 4. No acute abdominal/pelvic findings, mass lesions or adenopathy. 5. Stable advanced vascular calcifications.  I have again  discussed with her based on the length of time from diagnosis she will be seen in 1 year.  She will be set up for scans for ongoing monitoring of pulmonary nodule.  I have discussed with her that after 3 years of stable findings ongoing imaging is usually not recommended.    2.  Pulmonary nodules.  Review of records shows that she had a CT of the chest without contrast done 01/26/2017 that showed multiple new 2  mm pulmonary nodules in the lung with follow-up imaging recommended in 1 year.  CT chest done 01/04/2018 reviewed and showed  IMPRESSION: 1. Stable scattered sub 4 mm pulmonary nodules, likely benign given their stability since 2017.  I discussed with her she will be set up for scan in 12/2018 but if stable findings for 3 years ongoing imaging is usually not recommended.   3.  Renal lesion.  CT of the abdomen and pelvis done November 23, 2016  showed diverticulosis without diverticulitis.  There was a new subcapsular lesion in the left kidney thought to represent a cyst but neoplasm could not be excluded.  She was recommended for MRI of the abdomen.  MRI of the abdomen was done on 05/18/2017 that showed no suspicious renal masses.  She had a 0.9 cm renal cyst.  The patient reports she has been seen by urology with no further work-up recommended.   CT CAP done 01/04/2018 reviewed and showed  IMPRESSION: 8 mm exophytic left renal lesion consistent with a benignhyperdense/hemorrhagic cyst.  She will be set up for repeat CT of the abdomen and pelvis in December 2020 for interval follow-up.  She will be seen for follow-up in 12/2018 to go over results.    4.  Smoking.  Patient reports she has not smoked for 25 years.  She had evidence of pulmonary nodules noted on scans done in December 2018.  Scan done 12/2017 showed stable findings dating back to 2017.  Ongoing smoking cessation recommended.    5.  Hypertension.  Blood pressure 99/55.   Follow-up with PCP.    6.  Weight loss and hair thinning.  Pt reports she has changed  her diet which may contribute to weight loss.  I have discussed with her to also speak with PCP regarding if thyroid levels have been checked.    25 minutes spent with more than 50% spent in counseling and coordination of care.    Interval History:  Historical data obtained from note dated 08/13/2017.  77 year old female referred for consultation due to breast cancer and abnormal  imaging.  Patient was reportedly diagnosed with breast cancer more than 25 years ago.  She was not treated with chemotherapy hormonal therapy or radiation.  She underwent bilateral mastectomy.  Review of records shows that she had a CT of the chest without contrast on 01/26/2017 that showed multiple new 2 mm pulmonary nodules in the lung with follow-up imaging recommended in 1 year.  She also had a CT of the abdomen and pelvis done November 23, 2016 that showed diverticulosis without diverticulitis.  There was a new subcapsular lesion in the left kidney thought to represent a cyst but neoplasm could not be excluded.  She was recommended for MRI of the abdomen.  MRI of the abdomen was done on 05/18/2017 that showed no suspicious renal masses.  She had a 0.9 cm renal cyst.  The patient reports she has been seen by urology with no further work-up recommended.    Current Status:  Pt is seen today for follow-up.  She is reporting weight loss and hair thinning.  She is here to go over labs and scans.    Problem List Patient Active Problem List   Diagnosis Date Noted  . Carotid stenosis [I65.29] 07/23/2015  . Multiple pulmonary nodules [R91.8] 02/11/2015  . Essential hypertension [I10] 07/08/2013  . Preoperative evaluation to rule out surgical contraindication [Z01.818] 07/08/2013  . Sinus tachycardia [R00.0] 01/08/2013  . Difficulty in walking(719.7) [R26.2] 11/04/2012  . Stiffness of left knee [M25.662] 11/04/2012  . Left knee pain [M25.562] 11/04/2012  . Postoperative anemia due to acute blood loss [D62] 10/15/2012  . GERD (gastroesophageal reflux disease) [K21.9] 10/15/2012  . Unspecified essential hypertension [I10] 10/15/2012  . Hyperlipidemia [E78.5] 10/15/2012  . OA (osteoarthritis) of knee [M17.10] 10/14/2012  . Cough [R05] 02/14/2012  . Dyspnea [R06.00] 02/09/2012  . Chest pain [R07.9] 02/09/2012    Past Medical History Past Medical History:  Diagnosis Date  . Arthritis    oa  . Blood  transfusion 1948  . Blood transfusion without reported diagnosis   . Breast cancer ([place]) 1994   bilateral / HX skin cancer  . Cataract    both eyes  . COPD (chronic obstructive pulmonary disease) ([place])   . Disc disease, degenerative, cervical    trouble turing head and neck at times  . Diverticulosis   . DVT (deep venous thrombosis) ([place]) 2002   left leg  . Fibromyalgia   . Hx of skin cancer, basal cell   . Hyperlipidemia   . Hypertension   . Measles as child  . Mumps age 3  . PONV (postoperative nausea and vomiting)   . Sinus tachycardia   . Tubular adenoma of colon     Past Surgical History Past Surgical History:  Procedure Laterality Date  . ABDOMINAL HYSTERECTOMY  1976   partial  . achilles tendon adn 2 bone spurs removed Left 05-2012  . [place]   lower  . bilateral mastectomy with tramflap reconstruction  1994  . COLONOSCOPY    . KNEE ARTHROSCOPY Right 2008  . KNEE SURGERY  2002   arthroscopy-bilateral  .  MASTECTOMY     bilateral  . PARTIAL HYSTERECTOMY    . POLYPECTOMY    . 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 TOTAL KNEE ARTHROPLASTY Right 10/20/2013   Procedure: RIGHT TOTAL KNEE ARTHROPLASTY;  Surgeon: Gearlean Alf, MD;  Location: [facility];  Service: Orthopedics;  Laterality: Right;    Family History Family History  Problem Relation Age of Onset  . Alzheimer's disease Mother   . Alcohol abuse Father   . Sudden death Father   . Liver disease Father        fatty liver disease  . Other Father        gastritis  . Healthy Brother   . Alcohol abuse Daughter   . COPD Daughter   . Healthy Grandchild   . Heart disease Maternal Grandfather   . CAD Maternal Grandfather   . Sudden death Brother        at birth  . Unexplained death Daughter   . Alcohol abuse Daughter   . Healthy Sister   . Colon cancer Neg  Hx   . Colon polyps Neg Hx   . Rectal cancer Neg Hx   . Stomach cancer Neg Hx      Social History  reports that she quit smoking about 25 years ago. Her smoking use included cigarettes. She has a 20.00 pack-year smoking history. She has never used smokeless tobacco. She reports that she drinks about 5.0 standard drinks of alcohol per week. She reports that she does not use drugs.  Medications  Current Outpatient Medications:  .  cholecalciferol (VITAMIN D) 1000 units tablet, Take 1,000 Units by mouth daily., Disp: , Rfl:  .  metoprolol tartrate (LOPRESSOR) 50 MG tablet, TAKE 1 TABLET BY MOUTH TWICE DAILY, Disp: 180 tablet, Rfl: 2 .  Multiple Vitamin (MULTIVITAMIN) tablet, Take 1 tablet by mouth daily., Disp: , Rfl:  .  telmisartan-hydrochlorothiazide (MICARDIS HCT) 40-12.5 MG tablet, Take 0.5 tablets by mouth daily., Disp: 45 tablet, Rfl: 1 .  vitamin E 400 UNIT capsule, Take 400 Units by mouth daily., Disp: , Rfl:   Allergies Aspirin; Atorvastatin; Codeine; Ibuprofen; Lisinopril; and Rosuvastatin  Review of Systems Review of Systems - Oncology ROS negative other than weight loss.     Physical Exam  Vitals Wt Readings from Last 3 Encounters:  01/08/18 145 lb 11.2 oz (66.1 kg)  08/13/17 162 lb (73.5 kg)  08/03/17 160 lb 12.8 oz (72.9 kg)   Temp Readings from Last 3 Encounters:  01/08/18 98.6 F (37 C) (Oral)  08/13/17 98.2 F (36.8 C) (Oral)  07/06/15 97.8 F (36.6 C)   BP Readings from Last 3 Encounters:  01/08/18 (!) 99/55  08/13/17 (!) 121/53  08/03/17 112/64   Pulse Readings from Last 3 Encounters:  01/08/18 80  08/13/17 74  08/03/17 73   Constitutional: Well-developed, well-nourished, and in no distress.   HENT: Head: Normocephalic and atraumatic.  Mouth/Throat: No oropharyngeal exudate. Mucosa moist. Eyes: Pupils are equal, round, and reactive to light. Conjunctivae are normal. No scleral icterus.  Neck: Normal range of motion. Neck supple. No JVD present.   Cardiovascular: Normal rate, regular rhythm and normal heart sounds.  Exam reveals no gallop and no friction rub.   No murmur heard. Pulmonary/Chest: Effort normal and breath sounds normal. No respiratory distress. No wheezes.No rales.  Abdominal: Soft. Bowel sounds are normal. No  distension. There is no tenderness. There is no guarding.  Musculoskeletal: No edema or tenderness.  Lymphadenopathy: No cervical, axillary or supraclavicular adenopathy.  Neurological: Alert and oriented to person, place, and time. No cranial nerve deficit.  Skin: Skin is warm and dry. No rash noted. No erythema. No pallor.  Psychiatric: Affect and judgment normal.  Breast:  Chaperone present.  Bilateral tram reconstruction.    Labs No visits with results within 3 Day(s) from this visit.  Latest known visit with results is:  Appointment on 01/02/2018  Component Date Value Ref Range Status  . WBC 01/02/2018 4.9  4.0 - 10.5 K/uL Final  . RBC 01/02/2018 4.43  3.87 - 5.11 MIL/uL Final  . Hemoglobin 01/02/2018 14.2  12.0 - 15.0 g/dL Final  . HCT 01/02/2018 42.8  36.0 - 46.0 % Final  . MCV 01/02/2018 96.6  80.0 - 100.0 fL Final  . MCH 01/02/2018 32.1  26.0 - 34.0 pg Final  . MCHC 01/02/2018 33.2  30.0 - 36.0 g/dL Final  . RDW 01/02/2018 12.4  11.5 - 15.5 % Final  . Platelets 01/02/2018 252  150 - 400 K/uL Final  . nRBC 01/02/2018 0.0  0.0 - 0.2 % Final  . Neutrophils Relative % 01/02/2018 56  % Final  . Neutro Abs 01/02/2018 2.7  1.7 - 7.7 K/uL Final  . Lymphocytes Relative 01/02/2018 36  % Final  . Lymphs Abs 01/02/2018 1.7  0.7 - 4.0 K/uL Final  . Monocytes Relative 01/02/2018 7  % Final  . Monocytes Absolute 01/02/2018 0.4  0.1 - 1.0 K/uL Final  . Eosinophils Relative 01/02/2018 1  % Final  . Eosinophils Absolute 01/02/2018 0.1  0.0 - 0.5 K/uL Final  . Basophils Relative 01/02/2018 0  % Final  . Basophils Absolute 01/02/2018 0.0  0.0 - 0.1 K/uL Final  . Immature Granulocytes 01/02/2018 0  % Final  . Abs  Immature Granulocytes 01/02/2018 0.01  0.00 - 0.07 K/uL Final   Performed at [facility], [address], [place], [place] [address]  . Sodium 01/02/2018 137  135 - 145 mmol/L Final  . Potassium 01/02/2018 4.1  3.5 - 5.1 mmol/L Final  . Chloride 01/02/2018 101  98 - 111 mmol/L Final  . CO2 01/02/2018 28  22 - 32 mmol/L Final  . Glucose, Bld 01/02/2018 114* 70 - 99 mg/dL Final  . BUN 01/02/2018 13  8 - 23 mg/dL Final  . Creatinine, Ser 01/02/2018 0.64  0.44 - 1.00 mg/dL Final  . Calcium 01/02/2018 9.3  8.9 - 10.3 mg/dL Final  . Total Protein 01/02/2018 7.3  6.5 - 8.1 g/dL Final  . Albumin 01/02/2018 4.2  3.5 - 5.0 g/dL Final  . AST 01/02/2018 23  15 - 41 U/L Final  . ALT 01/02/2018 11  0 - 44 U/L Final  . Alkaline Phosphatase 01/02/2018 77  38 - 126 U/L Final  . Total Bilirubin 01/02/2018 0.9  0.3 - 1.2 mg/dL Final  . GFR calc non Af Amer 01/02/2018 >60  >60 mL/min Final  . GFR calc Af Amer 01/02/2018 >60  >60 mL/min Final  . Anion gap 01/02/2018 8  5 - 15 Final   Performed at [facility], [address] [place], [place] [address]  . LDH 01/02/2018 153  98 - 192 U/L Final   Performed at [facility], [address], [place], [place] [address]     Pathology Orders Placed This Encounter  Procedures  . CT CHEST W CONTRAST  Standing Status:   Future    Standing Expiration Date:   01/09/2020    Order Specific Question:   If indicated for the ordered procedure, I authorize the administration of contrast media per Radiology protocol    Answer:   Yes    Order Specific Question:   Preferred imaging location?    Answer:   [facility]    Order Specific Question:   Radiology Contrast Protocol - do NOT remove file path    Answer:   [email or web address]  . CT ABDOMEN PELVIS W CONTRAST    Standing Status:   Future    Standing Expiration Date:   01/09/2020    Order Specific Question:   ** REASON FOR EXAM (FREE TEXT)    Answer:   renal lesion    Order Specific  Question:   If indicated for the ordered procedure, I authorize the administration of contrast media per Radiology protocol    Answer:   Yes    Order Specific Question:   Preferred imaging location?    Answer:   [facility]    Order Specific Question:   Is Oral Contrast requested for this exam?    Answer:   Yes, Per Radiology protocol    Order Specific Question:   Radiology Contrast Protocol - do NOT remove file path    Answer:   \\[email or web address]  . CBC with Differential/Platelet    Standing Status:   Future    Standing Expiration Date:   01/09/2020  . Comprehensive metabolic panel    Standing Status:   Future    Standing Expiration Date:   01/09/2020  . Lactate dehydrogenase    Standing Status:   Future    Standing Expiration Date:   01/09/2020       Zoila Shutter MD

## 2018-01-14 ENCOUNTER — Ambulatory Visit (HOSPITAL_COMMUNITY): Payer: Medicare Other

## 2018-01-14 ENCOUNTER — Other Ambulatory Visit: Payer: Medicare Other | Admitting: *Deleted

## 2018-01-14 DIAGNOSIS — E782 Mixed hyperlipidemia: Secondary | ICD-10-CM

## 2018-01-14 LAB — LIPID PANEL
Chol/HDL Ratio: 4.5 ratio — ABNORMAL HIGH (ref 0.0–4.4)
Cholesterol, Total: 244 mg/dL — ABNORMAL HIGH (ref 100–199)
HDL: 54 mg/dL (ref >39)
LDL Calculated: 153 mg/dL — ABNORMAL HIGH (ref 0–99)
Triglycerides: 186 mg/dL — ABNORMAL HIGH (ref 0–149)
VLDL Cholesterol Cal: 37 mg/dL (ref 5–40)

## 2018-01-14 LAB — HEPATIC FUNCTION PANEL
ALT: 7 IU/L (ref 0–32)
AST: 16 IU/L (ref 0–40)
Albumin: 4.1 g/dL (ref 3.5–4.8)
Alkaline Phosphatase: 83 IU/L (ref 39–117)
Bilirubin Total: 0.6 mg/dL (ref 0.0–1.2)
Bilirubin, Direct: 0.13 mg/dL (ref 0.00–0.40)
Total Protein: 6.3 g/dL (ref 6.0–8.5)

## 2018-01-15 ENCOUNTER — Telehealth: Payer: Self-pay | Admitting: Pharmacist

## 2018-01-15 DIAGNOSIS — E782 Mixed hyperlipidemia: Secondary | ICD-10-CM

## 2018-01-15 MED ORDER — ROSUVASTATIN CALCIUM 5 MG PO TABS: 5.0000 mg | ORAL_TABLET | Freq: Every day | ORAL | 11 refills | Status: DC

## 2018-01-15 NOTE — Telephone Encounter (Signed)
Spoke with pt regarding lab results - LDL increased since stopping Repatha due to side effects. She was started on Livalo 1mg  daily but she reports she cannot afford this. She is previously intolerant to atorvastatin 40mg  daily, pravastatin 20 and 40mg  daily, rosuvastatin5mg  and10mg  daily and 10mg  three times a week, Zetia 10mg  daily (muscle aches that improved off of medication). Discussed challenging with Praluent but she does not wish to do this. Does not wish to participate in clinical trial either. She would like to retry low dose rosuvastatin every other day. Rx sent in for 5mg  dose and advised pt to titrate dose to once daily if able. Recheck lipids in 8 weeks.

## 2018-01-16 ENCOUNTER — Other Ambulatory Visit: Payer: Self-pay | Admitting: Internal Medicine

## 2018-01-16 DIAGNOSIS — R918 Other nonspecific abnormal finding of lung field: Secondary | ICD-10-CM

## 2018-01-17 ENCOUNTER — Ambulatory Visit (HOSPITAL_COMMUNITY): Payer: Medicare Other | Admitting: Internal Medicine

## 2018-01-18 ENCOUNTER — Ambulatory Visit (INDEPENDENT_AMBULATORY_CARE_PROVIDER_SITE_OTHER): Payer: Medicare Other | Admitting: Cardiology

## 2018-01-18 ENCOUNTER — Encounter: Payer: Self-pay | Admitting: Cardiology

## 2018-01-18 VITALS — BP 98/48 | HR 71 | Ht 65.0 in | Wt 147.4 lb

## 2018-01-18 DIAGNOSIS — I10 Essential (primary) hypertension: Secondary | ICD-10-CM

## 2018-01-18 DIAGNOSIS — I6523 Occlusion and stenosis of bilateral carotid arteries: Secondary | ICD-10-CM

## 2018-01-18 DIAGNOSIS — I2510 Atherosclerotic heart disease of native coronary artery without angina pectoris: Secondary | ICD-10-CM | POA: Diagnosis not present

## 2018-01-18 DIAGNOSIS — E782 Mixed hyperlipidemia: Secondary | ICD-10-CM

## 2018-01-18 MED ORDER — ASPIRIN EC 81 MG PO TBEC: 81.0000 mg | DELAYED_RELEASE_TABLET | Freq: Every day | ORAL | 3 refills | Status: DC

## 2018-01-18 NOTE — Patient Instructions (Signed)
Medication Instructions:   STOP TAKING YOUR MICARDIS HCT NOW   START TAKING ASPIRIN 81 MG ONCE DAILY   If you need a refill on your cardiac medications before your next appointment, please call your pharmacy.     Follow-Up: At [facility], you and your health needs are our priority.  As part of our continuing mission to provide you with exceptional heart care, we have created designated Provider Care Teams.  These Care Teams include your primary Cardiologist (physician) and Advanced Practice Providers (APPs -  Physician Assistants and Nurse Practitioners) who all work together to provide you with the care you need, when you need it. You will need a follow up appointment in 6 months.  Please call our office 2 months in advance to schedule this appointment.  You may see Ena Dawley, MD or one of the following Advanced Practice Providers on your designated Care Team:   Finzel, PA-C Melina Copa, PA-C . Ermalinda Barrios, PA-C

## 2018-01-18 NOTE — Progress Notes (Signed)
Patient ID: Renee [name], female   DOB: 10-Nov-1940, 77 y.o.   MRN: [id number]    Patient Name: Renee [name] Date of Encounter: 01/18/2018  Primary Care Provider:  Deland Pretty, MD Primary Cardiologist:  Ena Dawley   Patient Profile  Chief complaint: One-year follow-up  Problem List   Past Medical History:  Diagnosis Date  . Arthritis    oa  . Blood transfusion 1948  . Blood transfusion without reported diagnosis   . Breast cancer ([place]) 1994   bilateral / HX skin cancer  . Cataract    both eyes  . COPD (chronic obstructive pulmonary disease) ([place])   . Disc disease, degenerative, cervical    trouble turing head and neck at times  . Diverticulosis   . DVT (deep venous thrombosis) ([place]) 2002   left leg  . Fibromyalgia   . Hx of skin cancer, basal cell   . Hyperlipidemia   . Hypertension   . Measles as child  . Mumps age 83  . PONV (postoperative nausea and vomiting)   . Sinus tachycardia   . Tubular adenoma of colon    Past Surgical History:  Procedure Laterality Date  . ABDOMINAL HYSTERECTOMY  1976   partial  . achilles tendon adn 2 bone spurs removed Left 05-2012  . [place]   lower  . bilateral mastectomy with tramflap reconstruction  1994  . COLONOSCOPY    . KNEE ARTHROSCOPY Right 2008  . KNEE SURGERY  2002   arthroscopy-bilateral  . MASTECTOMY     bilateral  . PARTIAL HYSTERECTOMY    . POLYPECTOMY    . 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 TOTAL KNEE ARTHROPLASTY Right 10/20/2013   Procedure: RIGHT TOTAL KNEE ARTHROPLASTY;  Surgeon: Gearlean Alf, MD;  Location: [facility];  Service: Orthopedics;  Laterality: Right;   Allergies  Allergies  Allergen Reactions  . Aspirin Nausea Only  . Atorvastatin Other (See Comments)    Pt reports causes "muscle aches."  . Codeine Itching    Mood changes,  can take percocet  . Ibuprofen Hives    Blisters   . Lisinopril Cough  . Rosuvastatin Other (See Comments)    Pt reports causes "muscle aches."   Chief complain: 1 year follow up  HPI  77 year old female with PMH is significant for significant coronary calcification on chest CT suggestive of CAD, dyspnea on exertion, HTN, HLD, DVT, and COPD. Carotid duplex in June 2017 showed stable 1-39% right ICA stenosis and stable 40-59% left ICA stenosis. Patient has a history of multiple statin intolerances and  Is followed at our lipid clinic.  07/14/2016 - this is one year follow-up, patient remains active and tries to walk a few blocks every day, it that she has no chest pain or shortness of breath, she also denies palpitation claudications, no lower extremity edema orthopnea or peripheral dyspnea dizziness. Her syncope. She was seen by our pharmacist Megan Supple in the lipid clinic and was started on Crestor 10 mg daily that she is tolerating well, her lipids were recently checked by Dr. Shelia Media, her LDL was 106, triglycerides 164.  08/03/2017, this is a 1 year follow-up, the patient remains active, walking daily, she continues to experience dyspnea on moderate exertion but it has not changed from prior.  She has only been taking half dose  of Micardis and her blood pressure has been well controlled.  She denies any chest pain, no lower extremity edema no orthopnea no proximal nocturnal dyspnea no palpitation or syncope.  She has been experiencing dizziness when she lays down in bed and turns her head.  She has been compliant with her medications, she was prescribed Praluent but has not been able to get it through her pharmacy yet.  Home Medications  Prior to Admission medications   Medication Sig Start Date End Date Taking? Authorizing Provider  DULoxetine (CYMBALTA) 60 MG capsule Take 60 mg by mouth every evening.   Yes Historical Provider, MD  ezetimibe (ZETIA) 10 MG tablet Take 10 mg by mouth every  evening.    Yes Historical Provider, MD  HYDROmorphone (DILAUDID) 2 MG tablet Take 1-2 tablets (2-4 mg total) by mouth every 3 (three) hours as needed. 10/16/12  Yes Alexzandrew Dara Lords, PA-C  losartan-hydrochlorothiazide (HYZAAR) 100-25 MG per tablet Take 0.5 tablets by mouth every morning.    Yes Historical Provider, MD  methocarbamol (ROBAXIN) 500 MG tablet Take 1 tablet (500 mg total) by mouth every 6 (six) hours as needed. 10/16/12  Yes Alexzandrew Dara Lords, PA-C  rivaroxaban (XARELTO) 10 MG TABS tablet Take 1 tablet (10 mg total) by mouth daily with breakfast. Take Xarelto for two and a half more weeks, then discontinue Xarelto. Once the patient has completed the Xarelto, they may resume the 81 mg Aspirin. 10/16/12  Yes Alexzandrew Dara Lords, PA-C  rosuvastatin (CRESTOR) 10 MG tablet Take 10 mg by mouth every evening.    Yes Historical Provider, MD    Family History  Family History  Problem Relation Age of Onset  . Alzheimer's disease Mother   . Alcohol abuse Father   . Sudden death Father   . Liver disease Father        fatty liver disease  . Other Father        gastritis  . Healthy Brother   . Alcohol abuse Daughter   . COPD Daughter   . Healthy Grandchild   . Heart disease Maternal Grandfather   . CAD Maternal Grandfather   . Sudden death Brother        at birth  . Unexplained death Daughter   . Alcohol abuse Daughter   . Healthy Sister   . Colon cancer Neg Hx   . Colon polyps Neg Hx   . Rectal cancer Neg Hx   . Stomach cancer Neg Hx     Social History  Social History   Socioeconomic History  . Marital status: Married    Spouse name: Not on file  . Number of children: 2  . Years of education: Not on file  . Highest education level: Not on file  Occupational History  . Occupation: RETIRED    Employer: RETIRED  Social Needs  . Financial resource strain: Not on file  . Food insecurity:    Worry: Not on file    Inability: Not on file  . Transportation needs:     Medical: Not on file    Non-medical: Not on file  Tobacco Use  . Smoking status: Former Smoker    Packs/day: 1.00    Years: 20.00    Pack years: 20.00    Types: Cigarettes    Last attempt to quit: 02/11/1992    Years since quitting: 25.9  . Smokeless tobacco: Never Used  Substance and Sexual Activity  . Alcohol use: Yes    Alcohol/week: 5.0 standard drinks  Types: 5 Glasses of wine per week    Comment: occasional wine  . Drug use: No  . Sexual activity: Not Currently    Partners: Male  Lifestyle  . Physical activity:    Days per week: Not on file    Minutes per session: Not on file  . Stress: Not on file  Relationships  . Social connections:    Talks on phone: Not on file    Gets together: Not on file    Attends religious service: Not on file    Active member of club or organization: Not on file    Attends meetings of clubs or organizations: Not on file    Relationship status: Not on file  . Intimate partner violence:    Fear of current or ex partner: Not on file    Emotionally abused: Not on file    Physically abused: Not on file    Forced sexual activity: Not on file  Other Topics Concern  . Not on file  Social History Narrative  . Not on file     Review of Systems as per HPI General:  No chills, fever, night sweats or weight changes.  Cardiovascular:  No chest pain, dyspnea on exertion, edema, orthopnea, palpitations, paroxysmal nocturnal dyspnea. Dermatological: No rash, lesions/masses Respiratory: No cough, dyspnea Urologic: No hematuria, dysuria Abdominal:   No nausea, vomiting, diarrhea, bright red blood per rectum, melena, or hematemesis Neurologic:  No visual changes, wkns, changes in mental status. All other systems reviewed and are otherwise negative except as noted above.  Physical Exam  BP 124/64, heart rate 68, weight 146 pounds. General: Pleasant, NAD Psych: Normal affect. Neuro: Alert and oriented X 3. Moves all extremities  spontaneously. HEENT: Normal  Neck: Supple, bruit over right carotid artery, no JVD. Lungs:  Resp regular and unlabored, CTA. Heart: RRR no s3, s4, or murmurs. Abdomen: Soft, non-tender, non-distended, BS + x 4.  Extremities: No clubbing, cyanosis or edema. DP/PT/Radials 2+ and equal bilaterally.  Accessory Clinical Findings  ECG - performed today August 03, 2017 shows normal sinus rhythm incomplete right bundle branch block, unchanged from prior, this is personally reviewed.  LE venous Duplex  - No evidence of deep vein or superficial thrombosis involving the left lower extremity and right common femoral vein. An enlarged inguinal lymph node is noted on the left. - No evidence of Baker's cyst on the left. Other specific details can be found in the table(s) above. Prepared and Electronically Authenticated by  Elam Dutch [id number]  CT PE IMPRESSION: Negative for pulmonary embolism. No acute chest abnormalities.  Several punctate pulmonary nodules in both lungs. Nodules measure between 2-3 mm and are nonspecific. If the patient is at high risk for bronchogenic carcinoma, follow-up chest CT at 1year is recommended. If the patient is at low risk, no follow-up is needed. This recommendation follows the consensus statement: Guidelines for Management of Small Pulmonary Nodules Detected on CT Scans: A Statement from the [place] as published in Radiology 2005; 237:395-400.  Electronically Signed By: Markus Daft M.D. On: 11/19/2012 15:52  TTE 12/03/2012 Study Conclusions  - Left ventricle: The cavity size was normal. Wall thickness was increased in a pattern of moderate LVH. Systolic function was normal. The estimated ejection fraction was in the range of 60% to 65%. - Atrial septum: No defect or patent foramen ovale was identified. - Pericardium, extracardiac: A trivial pericardial effusion was identified.   Exercise nuclear stress test:  12/24/12  Quantitative Gated Spect  Images  QGS EDV: 60 ml  QGS ESV: 17 ml  Impression  Exercise Capacity: Poor exercise capacity.  BP Response: Hypertensive blood pressure response.  Clinical Symptoms: Dyspnea  ECG Impression: PACs, ventricular couplet. No ischemic ST segment changes.  Comparison with Prior Nuclear Study: No significant change from previous study  Overall Impression: Normal stress nuclear study.  LV Ejection Fraction: 72%. LV Wall Motion: NL LV Function; NL Wall Motion  Loralie Champagne  12/24/2012  Exercise nuclear stress test: 07/2014  Nuclear stress EF: 83%.  Blood pressure demonstrated a hypertensive response to exercise.  Horizontal ST segment depression ST segment depression was noted during stress in the II, III, aVF, V4, V5 and V6 leads.  This is a low risk study.  The left ventricular ejection fraction is hyperdynamic (>65%).   Poor exercise tolerance achieving less than 5 METS with HTN respnse Positive ECG Perfusion images with breast attenuation no ischemia or infarct EF 83%    Assessment & Plan  1. Hyperlipidemia - significant with statin intolerance, I have personally reviewed her chest CT that was done in January of this year and she has diffuse calcifications in all 3 coronary arteries. She was started on Praluent and developed the same muscle pain.  She was restarted on Crestor 5 mg po daily just this week.  I will decrease the dose to 3 times a week to make sure that she can tolerate it.  2. Hypertensive heart disease without CHF - she is now hypotensive, sec to eight loss and good diet, I will d/c her Micardis.  3. Bilateral carotid disease- her carotid duplex showed stable 1-39% bilaterally.  No symptoms. No need to repeat right now.  4.  Coronary artery calcifications -continue Crestor 5 mg 3 times a week and add aspirin 81 mg daily.  Follow up in 6 months.  Ena Dawley, MD 01/18/2018, 11:38 AM

## 2018-01-24 DIAGNOSIS — I10 Essential (primary) hypertension: Secondary | ICD-10-CM | POA: Diagnosis not present

## 2018-01-24 DIAGNOSIS — E118 Type 2 diabetes mellitus with unspecified complications: Secondary | ICD-10-CM | POA: Diagnosis not present

## 2018-01-24 DIAGNOSIS — K219 Gastro-esophageal reflux disease without esophagitis: Secondary | ICD-10-CM | POA: Diagnosis not present

## 2018-01-24 DIAGNOSIS — E7800 Pure hypercholesterolemia, unspecified: Secondary | ICD-10-CM | POA: Diagnosis not present

## 2018-01-29 DIAGNOSIS — F410 Panic disorder [episodic paroxysmal anxiety] without agoraphobia: Secondary | ICD-10-CM | POA: Diagnosis not present

## 2018-01-29 DIAGNOSIS — M797 Fibromyalgia: Secondary | ICD-10-CM | POA: Diagnosis not present

## 2018-01-29 DIAGNOSIS — N281 Cyst of kidney, acquired: Secondary | ICD-10-CM | POA: Diagnosis not present

## 2018-01-29 DIAGNOSIS — E118 Type 2 diabetes mellitus with unspecified complications: Secondary | ICD-10-CM | POA: Diagnosis not present

## 2018-01-29 DIAGNOSIS — F5104 Psychophysiologic insomnia: Secondary | ICD-10-CM | POA: Diagnosis not present

## 2018-01-29 DIAGNOSIS — I10 Essential (primary) hypertension: Secondary | ICD-10-CM | POA: Diagnosis not present

## 2018-01-29 DIAGNOSIS — I6523 Occlusion and stenosis of bilateral carotid arteries: Secondary | ICD-10-CM | POA: Diagnosis not present

## 2018-01-29 DIAGNOSIS — E7800 Pure hypercholesterolemia, unspecified: Secondary | ICD-10-CM | POA: Diagnosis not present

## 2018-01-29 DIAGNOSIS — J449 Chronic obstructive pulmonary disease, unspecified: Secondary | ICD-10-CM | POA: Diagnosis not present

## 2018-01-29 DIAGNOSIS — R918 Other nonspecific abnormal finding of lung field: Secondary | ICD-10-CM | POA: Diagnosis not present

## 2018-01-29 DIAGNOSIS — M1990 Unspecified osteoarthritis, unspecified site: Secondary | ICD-10-CM | POA: Diagnosis not present

## 2018-03-06 DIAGNOSIS — N760 Acute vaginitis: Secondary | ICD-10-CM | POA: Diagnosis not present

## 2018-03-06 DIAGNOSIS — N898 Other specified noninflammatory disorders of vagina: Secondary | ICD-10-CM | POA: Diagnosis not present

## 2018-03-06 DIAGNOSIS — Z124 Encounter for screening for malignant neoplasm of cervix: Secondary | ICD-10-CM | POA: Diagnosis not present

## 2018-03-06 DIAGNOSIS — Z6824 Body mass index (BMI) 24.0-24.9, adult: Secondary | ICD-10-CM | POA: Diagnosis not present

## 2018-03-06 DIAGNOSIS — L900 Lichen sclerosus et atrophicus: Secondary | ICD-10-CM | POA: Diagnosis not present

## 2018-03-06 DIAGNOSIS — Z01419 Encounter for gynecological examination (general) (routine) without abnormal findings: Secondary | ICD-10-CM | POA: Diagnosis not present

## 2018-03-08 DIAGNOSIS — M810 Age-related osteoporosis without current pathological fracture: Secondary | ICD-10-CM | POA: Diagnosis not present

## 2018-03-11 ENCOUNTER — Other Ambulatory Visit: Payer: Medicare Other | Admitting: *Deleted

## 2018-03-11 DIAGNOSIS — E782 Mixed hyperlipidemia: Secondary | ICD-10-CM | POA: Diagnosis not present

## 2018-03-11 LAB — LIPID PANEL
Chol/HDL Ratio: 3.1 ratio (ref 0.0–4.4)
Cholesterol, Total: 210 mg/dL — ABNORMAL HIGH (ref 100–199)
HDL: 67 mg/dL
LDL Calculated: 119 mg/dL — ABNORMAL HIGH (ref 0–99)
Triglycerides: 122 mg/dL (ref 0–149)
VLDL Cholesterol Cal: 24 mg/dL (ref 5–40)

## 2018-03-12 ENCOUNTER — Telehealth: Payer: Self-pay | Admitting: *Deleted

## 2018-03-12 MED ORDER — ROSUVASTATIN CALCIUM 5 MG PO TABS: ORAL_TABLET | ORAL | 3 refills | Status: DC

## 2018-03-12 NOTE — Telephone Encounter (Signed)
-----   Message from Dorothy Spark, MD sent at 03/12/2018 11:18 AM EST ----- HDL and triglycerides at goal, elevated LDL, if she can tolerate Crestor 5 times a week I would increase it to that.

## 2018-03-12 NOTE — Telephone Encounter (Signed)
Spoke with the pt and informed her that per Dr [name], her HDL and TG are at goal, but her LDL is elevated, so she wanted me to ask the pt if she would be able to increase her rosuvastatin from taking it 3 times a week, to taking it 5 days a week.  Pt states she will give the increase a try, and she will call us back if she is unable to tolerate this.  Pt will now take rosuvastatin 5 mg by mouth 5 times a week.  Confirmed the pharmacy of choice with the pt.  Pt verbalized understanding and agrees with this plan.

## 2018-04-10 DIAGNOSIS — R079 Chest pain, unspecified: Secondary | ICD-10-CM | POA: Diagnosis not present

## 2018-04-10 DIAGNOSIS — I959 Hypotension, unspecified: Secondary | ICD-10-CM | POA: Diagnosis not present

## 2018-04-25 ENCOUNTER — Telehealth: Payer: Self-pay | Admitting: Emergency Medicine

## 2018-04-25 NOTE — Telephone Encounter (Signed)
Reschedule consult pre patient nothing further needed at this time.

## 2018-05-02 ENCOUNTER — Institutional Professional Consult (permissible substitution): Payer: Medicare Other | Admitting: Emergency Medicine

## 2018-06-10 DIAGNOSIS — M810 Age-related osteoporosis without current pathological fracture: Secondary | ICD-10-CM | POA: Diagnosis not present

## 2018-07-10 ENCOUNTER — Other Ambulatory Visit: Payer: Self-pay | Admitting: Cardiology

## 2018-07-10 ENCOUNTER — Telehealth: Payer: Self-pay | Admitting: Cardiology

## 2018-07-10 ENCOUNTER — Telehealth: Payer: Self-pay | Admitting: Pharmacist

## 2018-07-10 DIAGNOSIS — E785 Hyperlipidemia, unspecified: Secondary | ICD-10-CM

## 2018-07-10 NOTE — Telephone Encounter (Signed)
Patient called requesting labwork prior to appointment with Dr. [name]. Patient is follow in our lipid clinic. Changes to rosuvastatin made in february. Will order lipid panel and CMP.

## 2018-07-11 ENCOUNTER — Other Ambulatory Visit: Payer: Self-pay

## 2018-07-11 ENCOUNTER — Other Ambulatory Visit: Payer: Medicare Other

## 2018-07-11 DIAGNOSIS — Z20822 Contact with and (suspected) exposure to covid-19: Secondary | ICD-10-CM

## 2018-07-11 DIAGNOSIS — R6889 Other general symptoms and signs: Secondary | ICD-10-CM | POA: Diagnosis not present

## 2018-07-12 ENCOUNTER — Encounter: Payer: Self-pay | Admitting: *Deleted

## 2018-07-12 ENCOUNTER — Telehealth: Payer: Self-pay | Admitting: *Deleted

## 2018-07-12 NOTE — Telephone Encounter (Signed)
Left the pt a message to call the office back to set-up her virtual visit with Dr [name] on 6/24.

## 2018-07-12 NOTE — Telephone Encounter (Signed)
Virtual Visit Pre-Appointment Phone Call  "(Name), I am calling you today to discuss your upcoming appointment. We are currently trying to limit exposure to the virus that causes COVID-19 by seeing patients at home rather than in the office."  1. "What is the BEST phone number to call the day of the visit?" - include this in appointment notes-YES UPDATED  2. "Do you have or have access to (through a family member/friend) a smartphone with video capability that we can use for your visit?"  a. If no - list the appointment type as a PHONE visit in appointment notes-YES UPDATED  3. Confirm consent - "In the setting of the current Covid19 crisis, you are scheduled for a (phone ) visit with Dr. [name] on 07/24/18 at 10 am.  Just as we do with many in-office visits, in order for you to participate in this visit, we must obtain consent.  If you'd like, I can send this to your mychart (if signed up) or email for you to review.  Otherwise, I can obtain your verbal consent now.  All virtual visits are billed to your insurance company just like a normal visit would be.  By agreeing to a virtual visit, we'd like you to understand that the technology does not allow for your provider to perform an examination, and thus may limit your provider's ability to fully assess your condition. If your provider identifies any concerns that need to be evaluated in person, we will make arrangements to do so.  Finally, though the technology is pretty good, we cannot assure that it will always work on either your or our end, and in the setting of a video visit, we may have to convert it to a phone-only visit.  In either situation, we cannot ensure that we have a secure connection.  Are you willing to proceed?" STAFF: Did the patient verbally acknowledge consent to telehealth visit? Document YES/NO here: YES VERBAL CONSENT GIVEN AS WELL AS SENT TO HER MYCHART TO REVIEW.  4. Advise patient to be prepared - "Two hours prior to your  appointment, go ahead and check your blood pressure, pulse, oxygen saturation, and your weight (if you have the equipment to check those) and write them all down. When your visit starts, your provider will ask you for this information. If you have an Apple Watch or Kardia device, please plan to have heart rate information ready on the day of your appointment. Please have a pen and paper handy nearby the day of the visit as well."-YES PT WILL OBTAIN THESE PRIOR TO HER VISIT.    5. Inform patient they will receive a phone call 15 minutes prior to their appointment time (may be from unknown caller ID) so they should be prepared to [place] has been deemed a candidate for a follow-up tele-health visit to limit community exposure during the Covid-19 pandemic. I spoke with the patient via phone to ensure availability of phone/video source, confirm preferred email & phone number, and discuss instructions and expectations.  I reminded Renee [name] to be prepared with any vital sign and/or heart rhythm information that could potentially be obtained via home monitoring, at the time of her visit. I reminded [name] to expect a phone call prior to her visit.  [name], LPN [id number] 9:93 PM      FULL LENGTH CONSENT FOR TELE-HEALTH VISIT   I hereby voluntarily request, consent  and authorize [facility] and its employed or contracted physicians, Engineer, materials, nurse practitioners or other licensed health care professionals (the Practitioner), to provide me with telemedicine health care services (the "Services") as deemed necessary by the treating Practitioner. I acknowledge and consent to receive the Services by the Practitioner via telemedicine. I understand that the telemedicine visit will involve communicating with the Practitioner through live audiovisual communication technology and the disclosure of certain medical  information by electronic transmission. I acknowledge that I have been given the opportunity to request an in-person assessment or other available alternative prior to the telemedicine visit and am voluntarily participating in the telemedicine visit.  I understand that I have the right to withhold or withdraw my consent to the use of telemedicine in the course of my care at any time, without affecting my right to future care or treatment, and that the Practitioner or I may terminate the telemedicine visit at any time. I understand that I have the right to inspect all information obtained and/or recorded in the course of the telemedicine visit and may receive copies of available information for a reasonable fee.  I understand that some of the potential risks of receiving the Services via telemedicine include:  Marland Kitchen Delay or interruption in medical evaluation due to technological equipment failure or disruption; . Information transmitted may not be sufficient (e.g. poor resolution of images) to allow for appropriate medical decision making by the Practitioner; and/or  . In rare instances, security protocols could fail, causing a breach of personal health information.  Furthermore, I acknowledge that it is my responsibility to provide information about my medical history, conditions and care that is complete and accurate to the best of my ability. I acknowledge that Practitioner's advice, recommendations, and/or decision may be based on factors not within their control, such as incomplete or inaccurate data provided by me or distortions of diagnostic images or specimens that may result from electronic transmissions. I understand that the practice of medicine is not an exact science and that Practitioner makes no warranties or guarantees regarding treatment outcomes. I acknowledge that I will receive a copy of this consent concurrently upon execution via email to the email address I last provided but may also  request a printed copy by calling the office of [place].    I understand that my insurance will be billed for this visit.   I have read or had this consent read to me. . I understand the contents of this consent, which adequately explains the benefits and risks of the Services being provided via telemedicine.  . I have been provided ample opportunity to ask questions regarding this consent and the Services and have had my questions answered to my satisfaction. . I give my informed consent for the services to be provided through the use of telemedicine in my medical care  By participating in this telemedicine visit I agree to the above. YES PT GAVE VERBAL CONSENT FOR DR NELSON TO TREAT HER VIA TELEPHONE VISIT ON 6/24 AND THIS WAS ALSO SENT TO HER MYCHART TO REVIEW.

## 2018-07-16 LAB — NOVEL CORONAVIRUS, NAA: SARS-CoV-2, NAA: NOT DETECTED

## 2018-07-18 ENCOUNTER — Other Ambulatory Visit: Payer: Medicare Other | Admitting: *Deleted

## 2018-07-18 ENCOUNTER — Encounter: Payer: Self-pay | Admitting: Internal Medicine

## 2018-07-18 ENCOUNTER — Other Ambulatory Visit: Payer: Self-pay

## 2018-07-18 DIAGNOSIS — E785 Hyperlipidemia, unspecified: Secondary | ICD-10-CM | POA: Diagnosis not present

## 2018-07-18 LAB — COMPREHENSIVE METABOLIC PANEL
ALT: 8 IU/L (ref 0–32)
AST: 22 IU/L (ref 0–40)
Albumin/Globulin Ratio: 2.3 — ABNORMAL HIGH (ref 1.2–2.2)
Albumin: 4.6 g/dL (ref 3.7–4.7)
Alkaline Phosphatase: 74 IU/L (ref 39–117)
BUN/Creatinine Ratio: 15 (ref 12–28)
BUN: 10 mg/dL (ref 8–27)
Bilirubin Total: 0.5 mg/dL (ref 0.0–1.2)
CO2: 27 mmol/L (ref 20–29)
Calcium: 8.7 mg/dL (ref 8.7–10.3)
Chloride: 102 mmol/L (ref 96–106)
Creatinine, Ser: 0.65 mg/dL (ref 0.57–1.00)
GFR calc Af Amer: 99 mL/min/{1.73_m2} (ref >59)
GFR calc non Af Amer: 86 mL/min/{1.73_m2} (ref >59)
Globulin, Total: 2 g/dL (ref 1.5–4.5)
Glucose: 118 mg/dL — ABNORMAL HIGH (ref 65–99)
Potassium: 4.3 mmol/L (ref 3.5–5.2)
Sodium: 141 mmol/L (ref 134–144)
Total Protein: 6.6 g/dL (ref 6.0–8.5)

## 2018-07-18 LAB — LIPID PANEL
Chol/HDL Ratio: 3.3 ratio (ref 0.0–4.4)
Cholesterol, Total: 207 mg/dL — ABNORMAL HIGH (ref 100–199)
HDL: 63 mg/dL (ref >39)
LDL Calculated: 106 mg/dL — ABNORMAL HIGH (ref 0–99)
Triglycerides: 189 mg/dL — ABNORMAL HIGH (ref 0–149)
VLDL Cholesterol Cal: 38 mg/dL (ref 5–40)

## 2018-07-19 ENCOUNTER — Telehealth: Payer: Self-pay | Admitting: Pharmacist

## 2018-07-19 MED ORDER — ROSUVASTATIN CALCIUM 5 MG PO TABS: ORAL_TABLET | ORAL | 3 refills | Status: DC

## 2018-07-19 MED ORDER — OMEGA-3-ACID ETHYL ESTERS 1 G PO CAPS: 2.0000 g | ORAL_CAPSULE | Freq: Two times a day (BID) | ORAL | 11 refills | Status: DC

## 2018-07-19 NOTE — Telephone Encounter (Signed)
Spoke with patient. Reviewed lipid results. Agreeable to trying lovaza 2g BID.  Patient states she has been taking rosuvastatin 5mg  daily and is doing well on it. She would like to get her LDL down some more. Patient would like to try taking 10mg  everother day alternating with 5mg . I will send a new prescription. Will repeat lipid panel in 3 months.

## 2018-07-19 NOTE — Telephone Encounter (Signed)
Called patient to discuss lipid panel results LVM for patient to return call LDL improved after increasing rosuvastatin to 5 days a week. Not at goal of <70, but may be on the maximin tolerated dose. Patient unable to tolerate PCSK9 I therapy. She was in the clear trial but dropped out due to stomach pains. (will need to see if she ever found out if she was in placebo or drug group).  TG also elevated. Hesitant to add fenofibrate due to hx of muscle pains. Will add Lovaza 2g BID (Vascepa most likely cost prohibitive)

## 2018-07-24 ENCOUNTER — Encounter: Payer: Self-pay | Admitting: Cardiology

## 2018-07-24 ENCOUNTER — Other Ambulatory Visit: Payer: Self-pay

## 2018-07-24 ENCOUNTER — Telehealth (INDEPENDENT_AMBULATORY_CARE_PROVIDER_SITE_OTHER): Payer: Medicare Other | Admitting: Cardiology

## 2018-07-24 VITALS — BP 127/69 | HR 65 | Ht 64.0 in | Wt 135.0 lb

## 2018-07-24 DIAGNOSIS — I10 Essential (primary) hypertension: Secondary | ICD-10-CM

## 2018-07-24 DIAGNOSIS — E785 Hyperlipidemia, unspecified: Secondary | ICD-10-CM | POA: Diagnosis not present

## 2018-07-24 DIAGNOSIS — I2510 Atherosclerotic heart disease of native coronary artery without angina pectoris: Secondary | ICD-10-CM

## 2018-07-24 NOTE — Progress Notes (Signed)
Virtual Visit via Telephone Note   This visit type was conducted due to national recommendations for restrictions regarding the COVID-19 Pandemic (e.g. social distancing) in an effort to limit this patient's exposure and mitigate transmission in our community.  Due to her co-morbid illnesses, this patient is at least at moderate risk for complications without adequate follow up.  This format is felt to be most appropriate for this patient at this time.  The patient did not have access to video technology/had technical difficulties with video requiring transitioning to audio format only (telephone).  All issues noted in this document were discussed and addressed.  No physical exam could be performed with this format.  Please refer to the patient's chart for her  consent to telehealth for [facility].   Date:  07/24/2018   ID:  TRINITIE MCGIRR, DOB Jun 06, 1940, MRN [id number]  Patient Location: Home Provider Location: Office  PCP:  Deland Pretty, MD  Cardiologist:  Ena Dawley, MD   Evaluation Performed:  Follow-Up Visit  Chief Complaint:  Back pain  History of Present Illness:    The patient does not have symptoms concerning for COVID-19 infection (fever, chills, cough, or new shortness of breath).   AGAM TUOHY is a 78 y.o. female with a hx of coronary calcification on chest CT suggestive of CAD, dyspnea on exertion, HTN, HLD, DVT, and COPD. Carotid duplex in June 2017 showed stable 1-39% right ICA stenosis and stable 40-59% left ICA stenosis. Patient has a history of multiple statin intolerances and  Is followed at our lipid clinic.  08/03/2017, this is a 1 year follow-up, the patient remains active, walking daily, she continues to experience dyspnea on moderate exertion but it has not changed from prior.  She has only been taking half dose of Micardis and her blood pressure has been well controlled.  She denies any chest pain, no lower extremity edema no orthopnea no proximal  nocturnal dyspnea no palpitation or syncope.  She has been experiencing dizziness when she lays down in bed and turns her head.  She has been compliant with her medications, she was prescribed Praluent but has not been able to get it through her pharmacy yet.  07/23/2018 -this is 1 year follow-up, the patient was not able to tolerate Praluent as she developed muscle pain but now is able to tolerate low-dose of Crestor.  She is unable to walk as she has significant spinal stenosis and she is seeing an orthopedic surgeon for that potentially starting physical therapy.  She denies any chest pain no shortness of breath, orthopnea, proximal nocturnal dyspnea or lower extremity edema.  Past Medical History:  Diagnosis Date  . Arthritis    oa  . Blood transfusion 1948  . Blood transfusion without reported diagnosis   . Breast cancer ([place]) 1994   bilateral / HX skin cancer  . Cataract    both eyes  . COPD (chronic obstructive pulmonary disease) ([place])   . Disc disease, degenerative, cervical    trouble turing head and neck at times  . Diverticulosis   . DVT (deep venous thrombosis) ([place]) 2002   left leg  . Fibromyalgia   . Hx of skin cancer, basal cell   . Hyperlipidemia   . Hypertension   . Measles as child  . Mumps age 14  . PONV (postoperative nausea and vomiting)   . Sinus tachycardia   . Tubular adenoma of colon     Past Surgical History:  Procedure Laterality Date  .  ABDOMINAL HYSTERECTOMY  1976   partial  . achilles tendon adn 2 bone spurs removed Left 05-2012  . [place]   lower  . bilateral mastectomy with tramflap reconstruction  1994  . COLONOSCOPY    . KNEE ARTHROSCOPY Right 2008  . KNEE SURGERY  2002   arthroscopy-bilateral  . MASTECTOMY     bilateral  . PARTIAL HYSTERECTOMY    . POLYPECTOMY    . TOTAL KNEE ARTHROPLASTY Left 10/14/2012   Procedure: LEFT TOTAL KNEE ARTHROPLASTY;  Surgeon: Gearlean Alf, MD;  Location: [facility];  Service: Orthopedics;   Laterality: Left;  . TOTAL KNEE ARTHROPLASTY Right 10/20/2013   Procedure: RIGHT TOTAL KNEE ARTHROPLASTY;  Surgeon: Gearlean Alf, MD;  Location: [facility];  Service: Orthopedics;  Laterality: Right;    Current Medications: Current Meds  Medication Sig  . ALPRAZolam (XANAX) 0.25 MG tablet TK 1 T PO BID  . aspirin EC 81 MG tablet Take 1 tablet (81 mg total) by mouth daily.  . cholecalciferol (VITAMIN D) 1000 units tablet Take 1,000 Units by mouth daily.  Marland Kitchen denosumab (PROLIA) 60 MG/ML SOSY injection Inject 60 mg into the skin every 6 (six) months.   . DULoxetine (CYMBALTA) 30 MG capsule TK 1 C PO D  . metoprolol tartrate (LOPRESSOR) 50 MG tablet TAKE 1 TABLET BY MOUTH TWICE DAILY  . Multiple Vitamin (MULTIVITAMIN) tablet Take 1 tablet by mouth daily.  Marland Kitchen omega-3 acid ethyl esters (LOVAZA) 1 g capsule Take 2 capsules (2 g total) by mouth 2 (two) times daily.  . rosuvastatin (CRESTOR) 5 MG tablet Take 2 tablets every other day, alternating with 1 tablet the other days.     Allergies:   Aspirin, Atorvastatin, Codeine, Ibuprofen, Lisinopril, and Rosuvastatin   Social History   Socioeconomic History  . Marital status: Married    Spouse name: Not on file  . Number of children: 2  . Years of education: Not on file  . Highest education level: Not on file  Occupational History  . Occupation: RETIRED    Employer: RETIRED  Social Needs  . Financial resource strain: Not on file  . Food insecurity    Worry: Not on file    Inability: Not on file  . Transportation needs    Medical: Not on file    Non-medical: Not on file  Tobacco Use  . Smoking status: Former Smoker    Packs/day: 1.00    Years: 20.00    Pack years: 20.00    Types: Cigarettes    Quit date: 02/11/1992    Years since quitting: 26.4  . Smokeless tobacco: Never Used  Substance and Sexual Activity  . Alcohol use: Yes    Alcohol/week: 5.0 standard drinks    Types: 5 Glasses of wine per week    Comment: occasional wine  .  Drug use: No  . Sexual activity: Not Currently    Partners: Male  Lifestyle  . Physical activity    Days per week: Not on file    Minutes per session: Not on file  . Stress: Not on file  Relationships  . Social Herbalist on phone: Not on file    Gets together: Not on file    Attends religious service: Not on file    Active member of club or organization: Not on file    Attends meetings of clubs or organizations: Not on file    Relationship status: Not on file  Other Topics  Concern  . Not on file  Social History Narrative  . Not on file     Family History: The patient's family history includes Alcohol abuse in her daughter, daughter, and father; Alzheimer's disease in her mother; CAD in her maternal grandfather; COPD in her daughter; Healthy in her brother, grandchild, and sister; Heart disease in her maternal grandfather; Liver disease in her father; Other in her father; Sudden death in her brother and father; Unexplained death in her daughter. There is no history of Colon cancer, Colon polyps, Rectal cancer, or Stomach cancer.  ROS:   Please see the history of present illness.    All other systems reviewed and are negative.  EKGs/Labs/Other Studies Reviewed:    The following studies were reviewed today:   LE venous Duplex  - No evidence of deep vein or superficial thrombosis involving the left lower extremity and right common femoral vein. An enlarged inguinal lymph node is noted on the left. - No evidence of Baker's cyst on the left. Other specific details can be found in the table(s) above. Prepared and Electronically Authenticated by  Elam Dutch [id number]  CT PE IMPRESSION: Negative for pulmonary embolism. No acute chest abnormalities.  Several punctate pulmonary nodules in both lungs. Nodules measure between 2-3 mm and are nonspecific. If the patient is at high risk for bronchogenic carcinoma, follow-up chest CT at 1year is  recommended. If the patient is at low risk, no follow-up is needed. This recommendation follows the consensus statement: Guidelines for Management of Small Pulmonary Nodules Detected on CT Scans: A Statement from the [place] as published in Radiology 2005; 237:395-400.  Electronically Signed By: Markus Daft M.D. On: 11/19/2012 15:52  TTE 12/03/2012 Study Conclusions  - Left ventricle: The cavity size was normal. Wall thickness was increased in a pattern of moderate LVH. Systolic function was normal. The estimated ejection fraction was in the range of 60% to 65%. - Atrial septum: No defect or patent foramen ovale was identified. - Pericardium, extracardiac: A trivial pericardial effusion was identified.   Exercise nuclear stress test: 12/24/12  Quantitative Gated Spect Images  QGS EDV: 60 ml  QGS ESV: 17 ml  Impression  Exercise Capacity: Poor exercise capacity.  BP Response: Hypertensive blood pressure response.  Clinical Symptoms: Dyspnea  ECG Impression: PACs, ventricular couplet. No ischemic ST segment changes.  Comparison with Prior Nuclear Study: No significant change from previous study  Overall Impression: Normal stress nuclear study.  LV Ejection Fraction: 72%. LV Wall Motion: NL LV Function; NL Wall Motion  Loralie Champagne  12/24/2012  Exercise nuclear stress test: 07/2014  Nuclear stress EF: 83%.  Blood pressure demonstrated a hypertensive response to exercise.  Horizontal ST segment depression ST segment depression was noted during stress in the II, III, aVF, V4, V5 and V6 leads.  This is a low risk study.  The left ventricular ejection fraction is hyperdynamic (>65%).   Poor exercise tolerance achieving less than 5 METS with HTN respnse Positive ECG Perfusion images with breast attenuation no ischemia or infarct EF 83%   EKG:  EKG is NOT ordered today.   Recent Labs: 01/02/2018: Hemoglobin 14.2; Platelets 252 07/18/2018: ALT 8; BUN 10;  Creatinine, Ser 0.65; Potassium 4.3; Sodium 141  Recent Lipid Panel    Component Value Date/Time   CHOL 207 (H) 07/18/2018 0845   TRIG 189 (H) 07/18/2018 0845   HDL 63 07/18/2018 0845   CHOLHDL 3.3 07/18/2018 0845   CHOLHDL 5.3 (H) 10/19/2015 0813  VLDL 44 (H) 10/19/2015 0813   LDLCALC 106 (H) 07/18/2018 0845   LDLDIRECT 124.0 08/17/2014 0909    Physical Exam:    VS:  BP 127/69   Pulse 65   Ht 5\' 4"  (1.626 m)   Wt 135 lb (61.2 kg)   BMI 23.17 kg/m     Wt Readings from Last 3 Encounters:  07/24/18 135 lb (61.2 kg)  01/18/18 147 lb 6.4 oz (66.9 kg)  01/08/18 145 lb 11.2 oz (66.1 kg)     GEN: Well nourished, well developed in no acute distress HEENT: Normal NECK: No JVD; No carotid bruits LYMPHATICS: No lymphadenopathy CARDIAC: RRR, no murmurs, rubs, gallops RESPIRATORY:  Clear to auscultation without rales, wheezing or rhonchi  ABDOMEN: Soft, non-tender, non-distended MUSCULOSKELETAL:  No edema; No deformity  SKIN: Warm and dry NEUROLOGIC:  Alert and oriented x 3 PSYCHIATRIC:  Normal affect   ASSESSMENT:    No diagnosis found. PLAN:    In order of problems listed above:  1. Hyperlipidemia -  her chest CT that was done in January of this year and she has diffuse calcifications in all 3 coronary arteries. She developed myalgias with Praluent and higher dose of statins, now tolerating lower dose of Crestor, LDL at goal, triglycerides still elevated we have added Lovaza 2 g daily.    We will follow-up in 6 months with repeat lipid panel.    2.  Coronary calcifications -aggressive medical management as above also she is encouraged to exercise on a regular basis.  2. Hypertensive heart disease without CHF -blood pressure well controlled.  4. Bilateral carotid disease- her carotid duplex showed stable 1-39% bilaterally.  No symptoms. No need to repeat right now.  Medication Adjustments/Labs and Tests Ordered: Current medicines are reviewed at length with the patient  today.  Concerns regarding medicines are outlined above.  No orders of the defined types were placed in this encounter.  No orders of the defined types were placed in this encounter.   There are no Patient Instructions on file for this visit.   Signed, Ena Dawley, MD  07/24/2018 10:02 AM    [place]

## 2018-07-24 NOTE — Patient Instructions (Signed)
Medication Instructions:  Your physician recommends that you continue on your current medications as directed. Please refer to the Current Medication list given to you today.  If you need a refill on your cardiac medications before your next appointment, please call your pharmacy.   Lab work: 01/20/2019:  PLEASE COME FASTING:  LIPID, CMET, CBC, & TSH    If you have labs (blood work) drawn today and your tests are completely normal, you will receive your results only by: Marland Kitchen MyChart Message (if you have MyChart) OR . A paper copy in the mail If you have any lab test that is abnormal or we need to change your treatment, we will call you to review the results.  Testing/Procedures: None ordered  Follow-Up: At [facility], you and your health needs are our priority.  As part of our continuing mission to provide you with exceptional heart care, we have created designated Provider Care Teams.  These Care Teams include your primary Cardiologist (physician) and Advanced Practice Providers (APPs -  Physician Assistants and Nurse Practitioners) who all work together to provide you with the care you need, when you need it. You will need a follow up appointment in 6 months.  Please call our office 2 months in advance to schedule this appointment.  You may see Ena Dawley, MD or one of the following Advanced Practice Providers on your designated Care Team:   [place], PA-C Melina Copa, PA-C . Ermalinda Barrios, PA-C  Any Other Special Instructions Will Be Listed Below (If Applicable).

## 2018-07-26 DIAGNOSIS — D1801 Hemangioma of skin and subcutaneous tissue: Secondary | ICD-10-CM | POA: Diagnosis not present

## 2018-07-26 DIAGNOSIS — L821 Other seborrheic keratosis: Secondary | ICD-10-CM | POA: Diagnosis not present

## 2018-07-26 DIAGNOSIS — L570 Actinic keratosis: Secondary | ICD-10-CM | POA: Diagnosis not present

## 2018-07-26 DIAGNOSIS — D225 Melanocytic nevi of trunk: Secondary | ICD-10-CM | POA: Diagnosis not present

## 2018-07-26 DIAGNOSIS — L918 Other hypertrophic disorders of the skin: Secondary | ICD-10-CM | POA: Diagnosis not present

## 2018-07-26 DIAGNOSIS — D2262 Melanocytic nevi of left upper limb, including shoulder: Secondary | ICD-10-CM | POA: Diagnosis not present

## 2018-07-26 DIAGNOSIS — L814 Other melanin hyperpigmentation: Secondary | ICD-10-CM | POA: Diagnosis not present

## 2018-08-07 ENCOUNTER — Ambulatory Visit (AMBULATORY_SURGERY_CENTER): Payer: Self-pay | Admitting: *Deleted

## 2018-08-07 ENCOUNTER — Other Ambulatory Visit: Payer: Self-pay

## 2018-08-07 VITALS — Ht 65.0 in | Wt 136.0 lb

## 2018-08-07 DIAGNOSIS — Z86010 Personal history of colonic polyps: Secondary | ICD-10-CM

## 2018-08-07 MED ORDER — NA SULFATE-K SULFATE-MG SULF 17.5-3.13-1.6 GM/177ML PO SOLN: 1.0000 | Freq: Once | ORAL | 0 refills | Status: AC

## 2018-08-07 NOTE — Progress Notes (Signed)
No egg or soy allergy known to patient  No issues with past sedation with any surgeries  or procedures, no intubation problems  No diet pills per patient No home 02 use per patient  No blood thinners per patient  Pt denies issues with constipation  No A fib or A flutter  EMMI video sent to pt's e mail   Pt verified name, DOB, address and insurance during PV today. Pt mailed instruction packet to included paper to complete and mail back to [facility] with addressed and stamped envelope,  copy of consent form to read and not return, and instructions.  PV completed over the phone. Pt encouraged to call with questions or issues

## 2018-08-20 ENCOUNTER — Encounter: Payer: Self-pay | Admitting: Pharmacist

## 2018-08-20 ENCOUNTER — Telehealth: Payer: Self-pay | Admitting: Internal Medicine

## 2018-08-20 NOTE — Telephone Encounter (Signed)

## 2018-08-21 ENCOUNTER — Ambulatory Visit (AMBULATORY_SURGERY_CENTER): Payer: Medicare Other | Admitting: Internal Medicine

## 2018-08-21 ENCOUNTER — Encounter: Payer: Self-pay | Admitting: Internal Medicine

## 2018-08-21 ENCOUNTER — Other Ambulatory Visit: Payer: Self-pay

## 2018-08-21 VITALS — BP 135/59 | HR 62 | Temp 98.1°F | Resp 20 | Ht 65.0 in | Wt 136.0 lb

## 2018-08-21 DIAGNOSIS — D122 Benign neoplasm of ascending colon: Secondary | ICD-10-CM

## 2018-08-21 DIAGNOSIS — Z86010 Personal history of colonic polyps: Secondary | ICD-10-CM

## 2018-08-21 DIAGNOSIS — I2510 Atherosclerotic heart disease of native coronary artery without angina pectoris: Secondary | ICD-10-CM | POA: Diagnosis not present

## 2018-08-21 DIAGNOSIS — I10 Essential (primary) hypertension: Secondary | ICD-10-CM | POA: Diagnosis not present

## 2018-08-21 DIAGNOSIS — D120 Benign neoplasm of cecum: Secondary | ICD-10-CM

## 2018-08-21 DIAGNOSIS — E785 Hyperlipidemia, unspecified: Secondary | ICD-10-CM | POA: Diagnosis not present

## 2018-08-21 DIAGNOSIS — J449 Chronic obstructive pulmonary disease, unspecified: Secondary | ICD-10-CM | POA: Diagnosis not present

## 2018-08-21 MED ORDER — SODIUM CHLORIDE 0.9 % IV SOLN: 500.0000 mL | Freq: Once | INTRAVENOUS | Status: DC

## 2018-08-21 NOTE — Progress Notes (Signed)
Pt's states no medical or surgical changes since previsit or office visit.  Temp taken by CW VS taken by JB

## 2018-08-21 NOTE — Progress Notes (Signed)
Report to PACU, RN, vss, BBS= Clear.  

## 2018-08-21 NOTE — Patient Instructions (Signed)
Discharge instructions given. Handouts on polyps,diverticulosis and hemorrhoids. Resume previous medications. YOU HAD AN ENDOSCOPIC PROCEDURE TODAY AT [facility]:   Refer to the procedure report that was given to you for any specific questions about what was found during the examination.  If the procedure report does not answer your questions, please call your gastroenterologist to clarify.  If you requested that your care partner not be given the details of your procedure findings, then the procedure report has been included in a sealed envelope for you to review at your convenience later.  YOU SHOULD EXPECT: Some feelings of bloating in the abdomen. Passage of more gas than usual.  Walking can help get rid of the air that was put into your GI tract during the procedure and reduce the bloating. If you had a lower endoscopy (such as a colonoscopy or flexible sigmoidoscopy) you may notice spotting of blood in your stool or on the toilet paper. If you underwent a bowel prep for your procedure, you may not have a normal bowel movement for a few days.  Please Note:  You might notice some irritation and congestion in your nose or some drainage.  This is from the oxygen used during your procedure.  There is no need for concern and it should clear up in a day or so.  SYMPTOMS TO REPORT IMMEDIATELY:   Following lower endoscopy (colonoscopy or flexible sigmoidoscopy):  Excessive amounts of blood in the stool  Significant tenderness or worsening of abdominal pains  Swelling of the abdomen that is new, acute  Fever of 100F or higher  For urgent or emergent issues, a gastroenterologist can be reached at any hour by calling [phone].   DIET:  We do recommend a small meal at first, but then you may proceed to your regular diet.  Drink plenty of fluids but you should avoid alcoholic beverages for 24 hours.  ACTIVITY:  You should plan to take it easy for the rest of today and you  should NOT DRIVE or use heavy machinery until tomorrow (because of the sedation medicines used during the test).    FOLLOW UP: Our staff will call the number listed on your records 48-72 hours following your procedure to check on you and address any questions or concerns that you may have regarding the information given to you following your procedure. If we do not reach you, we will leave a message.  We will attempt to reach you two times.  During this call, we will ask if you have developed any symptoms of COVID 19. If you develop any symptoms (ie: fever, flu-like symptoms, shortness of breath, cough etc.) before then, please call [phone].  If you test positive for Covid 19 in the 2 weeks post procedure, please call and report this information to [place].    If any biopsies were taken you will be contacted by phone or by letter within the next 1-3 weeks.  Please call us at [phone] if you have not heard about the biopsies in 3 weeks.    SIGNATURES/CONFIDENTIALITY: You and/or your care partner have signed paperwork which will be entered into your electronic medical record.  These signatures attest to the fact that that the information above on your After Visit Summary has been reviewed and is understood.  Full responsibility of the confidentiality of this discharge information lies with you and/or your care-partner.

## 2018-08-21 NOTE — Op Note (Signed)
[facility] Patient Name: Renee [name] Procedure Date: 08/21/2018 10:45 AM MRN: [id number] Endoscopist: Jerene Bears , MD Age: 78 Referring MD:  Date of Birth: [birth year] Gender: Female Account #: [id number] Procedure:                Colonoscopy Indications:              Surveillance: Personal history of adenomatous and                            sessile serrated polyps on last colonoscopy 3 years                            ago Medicines:                Monitored Anesthesia Care Procedure:                Pre-Anesthesia Assessment:                           - Prior to the procedure, a History and Physical                            was performed, and patient medications and                            allergies were reviewed. The patient's tolerance of                            previous anesthesia was also reviewed. The risks                            and benefits of the procedure and the sedation                            options and risks were discussed with the patient.                            All questions were answered, and informed consent                            was obtained. Prior Anticoagulants: The patient has                            taken no previous anticoagulant or antiplatelet                            agents. ASA Grade Assessment: II - A patient with                            mild systemic disease. After reviewing the risks                            and benefits, the patient was deemed in  satisfactory condition to undergo the procedure.                           After obtaining informed consent, the colonoscope                            was passed under direct vision. Throughout the                            procedure, the patient's blood pressure, pulse, and                            oxygen saturations were monitored continuously. The                            Colonoscope was introduced through the anus and                        advanced to the cecum, identified by appendiceal                            orifice and ileocecal valve. The colonoscopy was                            technically difficult and complex due to multiple                            diverticula in the colon and a tortuous sigmoid                            colon. Successful completion of the procedure was                            aided by water lavage, positioning towards her back                            and abdominal pressure. The patient tolerated the                            procedure well. The quality of the bowel                            preparation was good. Scope In: 10:59:12 AM Scope Out: 11:24:03 AM Scope Withdrawal Time: 0 hours 15 minutes 23 seconds  Total Procedure Duration: 0 hours 24 minutes 51 seconds  Findings:                 Hemorrhoids were found on perianal exam.                           A 2 mm polyp was found in the cecum. The polyp was                            sessile. The polyp was removed with a cold snare.  Resection and retrieval were complete.                           A 4 mm polyp was found in the ileocecal valve. The                            polyp was sessile. The polyp was removed with a                            cold snare. Resection and retrieval were complete.                           Two sessile polyps were found in the ascending                            colon. The polyps were 2 to 3 mm in size. These                            polyps were removed with a cold snare. Resection                            and retrieval were complete.                           A 6 mm polyp was found in the ascending colon. The                            polyp was flat. The polyp was removed with a cold                            snare. Resection and retrieval were complete.                           Multiple small and large-mouthed diverticula were                             found in the sigmoid colon, descending colon and                            hepatic flexure.                           Internal hemorrhoids were found during                            retroflexion. The hemorrhoids were large. Complications:            No immediate complications. Estimated Blood Loss:     Estimated blood loss was minimal. Impression:               - One 2 mm polyp in the cecum, removed with a cold                            snare. Resected  and retrieved.                           - One 4 mm polyp at the ileocecal valve, removed                            with a cold snare. Resected and retrieved.                           - Two 2 to 3 mm polyps in the ascending colon,                            removed with a cold snare. Resected and retrieved.                           - One 6 mm polyp in the ascending colon, removed                            with a cold snare. Resected and retrieved.                           - Severe diverticulosis in the sigmoid colon, in                            the descending colon and at the hepatic flexure.                           - Internal hemorrhoids. Recommendation:           - Patient has a contact number available for                            emergencies. The signs and symptoms of potential                            delayed complications were discussed with the                            patient. Return to normal activities tomorrow.                            Written discharge instructions were provided to the                            patient.                           - Resume previous diet.                           - Continue present medications.                           - Await pathology results.                           -  No recommendation at this time regarding repeat                            colonoscopy due to age at next screening interval                            (test usually stops around age 63. We can  discuss                            this decision at that time). Jerene Bears, MD 08/21/2018 11:35:28 AM This report has been signed electronically.

## 2018-08-23 ENCOUNTER — Telehealth: Payer: Self-pay

## 2018-08-23 NOTE — Telephone Encounter (Signed)
  Follow up Call-  Call back number 08/21/2018  Post procedure Call Back phone  # [phone]  Permission to leave phone message Yes  Some recent data might be hidden     Patient questions:  Do you have a fever, pain , or abdominal swelling? No. Pain Score  0 *  Have you tolerated food without any problems? Yes.    Have you been able to return to your normal activities? Yes.    Do you have any questions about your discharge instructions: Diet   No. Medications  No. Follow up visit  No.  Do you have questions or concerns about your Care? No.  Actions: * If pain score is 4 or above: No action needed, pain <4.  1. Have you developed a fever since your procedure? no  2.   Have you had an respiratory symptoms (SOB or cough) since your procedure? no  3.   Have you tested positive for COVID 19 since your procedure no  4.   Have you had any family members/close contacts diagnosed with the COVID 19 since your procedure?  no   If yes to any of these questions please route to Joylene John, RN and Alphonsa Gin, Therapist, sports.

## 2018-08-24 ENCOUNTER — Encounter: Payer: Self-pay | Admitting: Internal Medicine

## 2018-10-23 DIAGNOSIS — M4712 Other spondylosis with myelopathy, cervical region: Secondary | ICD-10-CM | POA: Diagnosis not present

## 2018-10-23 DIAGNOSIS — R030 Elevated blood-pressure reading, without diagnosis of hypertension: Secondary | ICD-10-CM | POA: Diagnosis not present

## 2018-10-24 ENCOUNTER — Ambulatory Visit (INDEPENDENT_AMBULATORY_CARE_PROVIDER_SITE_OTHER): Payer: Medicare Other | Admitting: Emergency Medicine

## 2018-10-24 ENCOUNTER — Other Ambulatory Visit: Payer: Self-pay

## 2018-10-24 ENCOUNTER — Encounter: Payer: Self-pay | Admitting: Emergency Medicine

## 2018-10-24 DIAGNOSIS — R0602 Shortness of breath: Secondary | ICD-10-CM | POA: Diagnosis not present

## 2018-10-24 DIAGNOSIS — R918 Other nonspecific abnormal finding of lung field: Secondary | ICD-10-CM | POA: Diagnosis not present

## 2018-10-24 DIAGNOSIS — I2510 Atherosclerotic heart disease of native coronary artery without angina pectoris: Secondary | ICD-10-CM | POA: Diagnosis not present

## 2018-10-24 NOTE — Patient Instructions (Addendum)
Your pulmonary nodules are small and have been stable on serial CT scans between 2014 and 2019.  This is very reassuring in the nodules are almost certainly benign.  You should not need a repeat scan to follow these.  We will not plan any further scans unless you have a change in your symptoms. Your pulmonary function testing from 2016 shows evidence for COPD.  It would be reasonable for [place] to repeat your pulmonary function testing and try inhaled medication if you develop shortness of breath going forward. Flu and pneumonia shots are up-to-date Follow with Dr. Lamonte Sakai in 12 months or sooner if you have any problems.

## 2018-10-24 NOTE — Assessment & Plan Note (Signed)
She has obstruction with a positive bronchodilator response on spirometry from 2016.  Consistent with COPD.  Discussed possible repeat PFT and also possible bronchodilator therapy with her today.  She would like to defer unless she has worsening in her symptoms.  She will call me if this happens.  I reviewed the signs and symptoms consistent with an acute exacerbation so that she can be aware of these in case she develops a flare.

## 2018-10-24 NOTE — Assessment & Plan Note (Signed)
I reviewed her serial CT scans between 2014 and most recently 12/2017.  She has scattered small (all less than 4 mm) nodules.  Based on size criteria and duration of stability these are benign.  She does not need any repeat scans from my perspective unless she has a clinical change.

## 2018-10-24 NOTE — Progress Notes (Signed)
Subjective:    Patient ID: Renee [name], female    DOB: 1941/01/06, 78 y.o.   MRN: [id number]  HPI 78 year old woman, former smoker (30 pack years) with a history of breast cancer that was treated by double mastectomy, hypertension, left lower extremity DVT no longer on anticoagulation, COPD diagnosed by spirometry 12/2014.  I have reviewed and this shows severe obstruction with a positive bronchodilator response.  She is here today to discuss pulmonary nodules and some exertional dyspnea. She is actually less concerned about the SOB than the nodular disease. No significant wheeze or sputum production.   She underwent CT scan of the chest on 01/04/2018 that I have reviewed.  This shows scattered pulmonary nodular disease all smaller than 4 mm.  There are comparison films going back to 11/19/2012.  On my comparison it would appear that all the nodules are stable.    Review of Systems  Constitutional: Negative for fever and unexpected weight change.  HENT: Negative for congestion, dental problem, ear pain, nosebleeds, postnasal drip, rhinorrhea, sinus pressure, sneezing, sore throat and trouble swallowing.   Eyes: Negative for redness and itching.  Respiratory: Positive for shortness of breath. Negative for cough, chest tightness and wheezing.   Cardiovascular: Negative for palpitations and leg swelling.  Gastrointestinal: Negative for nausea and vomiting.  Genitourinary: Negative for dysuria.  Musculoskeletal: Negative for joint swelling.  Skin: Negative for rash.  Neurological: Negative for headaches.  Hematological: Does not bruise/bleed easily.  Psychiatric/Behavioral: Negative for dysphoric mood. The patient is not nervous/anxious.    Past Medical History:  Diagnosis Date  . Arthritis    oa  . Blood transfusion 1948  . Blood transfusion without reported diagnosis   . Breast cancer ([place]) 1994   bilateral / HX skin cancer  . Cataract    both eyes  . COPD (chronic obstructive  pulmonary disease) ([place])   . Disc disease, degenerative, cervical    trouble turing head and neck at times  . Diverticulosis   . DVT (deep venous thrombosis) ([facility] [facility]) 2002   left leg  . Fibromyalgia   . Hx of skin cancer, basal cell   . Hyperlipidemia   . Hypertension   . Measles as child  . Mumps age 79  . Osteoporosis   . PONV (postoperative nausea and vomiting)   . Sinus tachycardia   . Tubular adenoma of colon      Family History  Problem Relation Age of Onset  . Alzheimer's disease Mother   . Alcohol abuse Father   . Sudden death Father   . Liver disease Father        fatty liver disease  . Other Father        gastritis  . Healthy Brother   . Alcohol abuse Daughter   . COPD Daughter   . Healthy Grandchild   . Heart disease Maternal Grandfather   . CAD Maternal Grandfather   . Sudden death Brother        at birth  . Unexplained death Daughter   . Alcohol abuse Daughter   . Healthy Sister   . Colon cancer Neg Hx   . Colon polyps Neg Hx   . Rectal cancer Neg Hx   . Stomach cancer Neg Hx   . Esophageal cancer Neg Hx      Social History   Socioeconomic History  . Marital status: Married    Spouse name: Not on file  . Number of children: 2  . Years of  education: Not on file  . Highest education level: Not on file  Occupational History  . Occupation: RETIRED    Employer: RETIRED  Social Needs  . Financial resource strain: Not on file  . Food insecurity    Worry: Not on file    Inability: Not on file  . Transportation needs    Medical: Not on file    Non-medical: Not on file  Tobacco Use  . Smoking status: Former Smoker    Packs/day: 1.00    Years: 20.00    Pack years: 20.00    Types: Cigarettes    Quit date: 02/11/1992    Years since quitting: 26.7  . Smokeless tobacco: Never Used  Substance and Sexual Activity  . Alcohol use: Yes    Alcohol/week: 5.0 standard drinks    Types: 5 Glasses of wine per week    Comment: occasional wine  . Drug use:  No  . Sexual activity: Not Currently    Partners: Male  Lifestyle  . Physical activity    Days per week: Not on file    Minutes per session: Not on file  . Stress: Not on file  Relationships  . Social Herbalist on phone: Not on file    Gets together: Not on file    Attends religious service: Not on file    Active member of club or organization: Not on file    Attends meetings of clubs or organizations: Not on file    Relationship status: Not on file  . Intimate partner violence    Fear of current or ex partner: Not on file    Emotionally abused: Not on file    Physically abused: Not on file    Forced sexual activity: Not on file  Other Topics Concern  . Not on file  Social History Narrative  . Not on file     Allergies  Allergen Reactions  . Aspirin Nausea Only  . Atorvastatin Other (See Comments)    Pt reports causes "muscle aches."  . Codeine Itching    Mood changes, can take percocet  . Ibuprofen Hives    Blisters   . Lisinopril Cough  . Repatha [Evolocumab]     Muscle aches  . Rosuvastatin Other (See Comments)    Pt reports causes "muscle aches."     Outpatient Medications Prior to Visit  Medication Sig Dispense Refill  . ALPRAZolam (XANAX) 0.25 MG tablet TK 1 T PO BID    . aspirin EC 81 MG tablet Take 1 tablet (81 mg total) by mouth daily. 90 tablet 3  . cholecalciferol (VITAMIN D) 1000 units tablet Take 1,000 Units by mouth daily.    Marland Kitchen denosumab (PROLIA) 60 MG/ML SOSY injection Inject 60 mg into the skin every 6 (six) months.     . DULoxetine (CYMBALTA) 30 MG capsule TK 1 C PO D    . metoprolol tartrate (LOPRESSOR) 50 MG tablet TAKE 1 TABLET BY MOUTH TWICE DAILY 180 tablet 1  . Multiple Vitamin (MULTIVITAMIN) tablet Take 1 tablet by mouth daily.    Marland Kitchen omega-3 acid ethyl esters (LOVAZA) 1 g capsule Take 2 capsules (2 g total) by mouth 2 (two) times daily. 120 capsule 11  . rosuvastatin (CRESTOR) 5 MG tablet Take 2 tablets every other day,  alternating with 1 tablet the other days. 135 tablet 3   No facility-administered medications prior to visit.         Objective:   Physical Exam  Vitals:   10/24/18 1443  BP: 124/78  Pulse: 89  SpO2: 96%  Weight: 143 lb (64.9 kg)  Height: 5' 4.5" (1.638 m)   Gen: Pleasant, well-nourished, in no distress,  normal affect  ENT: No lesions,  mouth clear,  oropharynx clear, no postnasal drip  Neck: No JVD, no stridor  Lungs: No use of accessory muscles, no crackles or wheezing on normal respiration, no wheeze on forced expiration  Cardiovascular: RRR, heart sounds normal, no murmur or gallops, no peripheral edema  Musculoskeletal: No deformities, no cyanosis or clubbing  Neuro: alert, awake, non focal  Skin: Warm, no lesions or rash      Assessment & Plan:  Multiple pulmonary nodules I reviewed her serial CT scans between 2014 and most recently 12/2017.  She has scattered small (all less than 4 mm) nodules.  Based on size criteria and duration of stability these are benign.  She does not need any repeat scans from my perspective unless she has a clinical change.  Dyspnea She has obstruction with a positive bronchodilator response on spirometry from 2016.  Consistent with COPD.  Discussed possible repeat PFT and also possible bronchodilator therapy with her today.  She would like to defer unless she has worsening in her symptoms.  She will call me if this happens.  I reviewed the signs and symptoms consistent with an acute exacerbation so that she can be aware of these in case she develops a flare.  Baltazar Apo, MD, PhD 10/24/2018, 3:18 PM [facility] [phone] or if no answer [phone]

## 2018-10-28 ENCOUNTER — Telehealth: Payer: Self-pay | Admitting: Pharmacist

## 2018-10-28 NOTE — Telephone Encounter (Signed)
Patient called upset that she was told she couldn't get appointment with Dr. [name] in Dec. States she needs labs done. She is due for lipid labs. Patient would get all labs done at same time. Will move lab appointment from dec to tomorrow per patient request.

## 2018-10-29 ENCOUNTER — Other Ambulatory Visit: Payer: Medicare Other

## 2018-10-29 ENCOUNTER — Other Ambulatory Visit: Payer: Self-pay

## 2018-10-29 DIAGNOSIS — E785 Hyperlipidemia, unspecified: Secondary | ICD-10-CM

## 2018-10-29 DIAGNOSIS — I2510 Atherosclerotic heart disease of native coronary artery without angina pectoris: Secondary | ICD-10-CM | POA: Diagnosis not present

## 2018-10-29 DIAGNOSIS — I10 Essential (primary) hypertension: Secondary | ICD-10-CM | POA: Diagnosis not present

## 2018-10-29 LAB — COMPREHENSIVE METABOLIC PANEL
ALT: 11 IU/L (ref 0–32)
AST: 22 IU/L (ref 0–40)
Albumin/Globulin Ratio: 1.9 (ref 1.2–2.2)
Albumin: 4.2 g/dL (ref 3.7–4.7)
Alkaline Phosphatase: 51 IU/L (ref 39–117)
BUN/Creatinine Ratio: 17 (ref 12–28)
BUN: 12 mg/dL (ref 8–27)
Bilirubin Total: 0.5 mg/dL (ref 0.0–1.2)
CO2: 26 mmol/L (ref 20–29)
Calcium: 8.8 mg/dL (ref 8.7–10.3)
Chloride: 100 mmol/L (ref 96–106)
Creatinine, Ser: 0.7 mg/dL (ref 0.57–1.00)
GFR calc Af Amer: 96 mL/min/{1.73_m2} (ref >59)
GFR calc non Af Amer: 83 mL/min/{1.73_m2} (ref >59)
Globulin, Total: 2.2 g/dL (ref 1.5–4.5)
Glucose: 103 mg/dL — ABNORMAL HIGH (ref 65–99)
Potassium: 4.8 mmol/L (ref 3.5–5.2)
Sodium: 138 mmol/L (ref 134–144)
Total Protein: 6.4 g/dL (ref 6.0–8.5)

## 2018-10-29 LAB — LIPID PANEL
Chol/HDL Ratio: 2.8 ratio (ref 0.0–4.4)
Cholesterol, Total: 201 mg/dL — ABNORMAL HIGH (ref 100–199)
HDL: 71 mg/dL (ref >39)
LDL Chol Calc (NIH): 112 mg/dL — ABNORMAL HIGH (ref 0–99)
Triglycerides: 104 mg/dL (ref 0–149)
VLDL Cholesterol Cal: 18 mg/dL (ref 5–40)

## 2018-10-29 LAB — CBC
Hematocrit: 40.2 % (ref 34.0–46.6)
Hemoglobin: 13.7 g/dL (ref 11.1–15.9)
MCH: 31.9 pg (ref 26.6–33.0)
MCHC: 34.1 g/dL (ref 31.5–35.7)
MCV: 94 fL (ref 79–97)
Platelets: 234 10*3/uL (ref 150–450)
RBC: 4.29 x10E6/uL (ref 3.77–5.28)
RDW: 11.9 % (ref 11.7–15.4)
WBC: 4.9 10*3/uL (ref 3.4–10.8)

## 2018-10-29 LAB — TSH: TSH: 2.6 u[IU]/mL (ref 0.450–4.500)

## 2018-10-30 ENCOUNTER — Telehealth: Payer: Self-pay | Admitting: Pharmacist

## 2018-10-30 NOTE — Telephone Encounter (Signed)
Left patient a VM to return call. Calling to review lipid lab results. Patients TG are much improved, but LDL is slightly higher than last time. Will need to confirm that patient increased her rousuvastatin to 5mg  alternating with 10mg .  Patient is on Lovaza however, it appears she has Pharmacist, community, therefore could switch to vascepa which has better cardiovascular data.  Patient was unable to afford Livalo ($36/month),  She was previously intolerant to atorvastatin 40mg  daily, pravastatin 20 and 40mg  daily, rosuvastatin5mg  and10mg  dailyand 10mg  three times a week, Zetia 10mg  daily(muscle aches that improved off of medication). She was also intolerant to Repatha due to side effects. Previously refused to try Praluent. Was in the clear trail (bempodoic acid) but dropped out due to stomach pains.  Options would be to try praluent or increase rosuvastatin if she is tolerating ok.

## 2018-10-31 NOTE — Telephone Encounter (Signed)
Patient returned call. She is still taking rosuvastatin 5mg  alternating with 10mg . Doing well. Patient willing to increase to 10mg  daily. Does not want to try Praluent at this time. Will increase rosuvastatin to 10mg  daily and repeat in 3 months.

## 2018-11-06 DIAGNOSIS — M4712 Other spondylosis with myelopathy, cervical region: Secondary | ICD-10-CM | POA: Diagnosis not present

## 2018-11-06 DIAGNOSIS — M542 Cervicalgia: Secondary | ICD-10-CM | POA: Diagnosis not present

## 2018-11-08 DIAGNOSIS — M4802 Spinal stenosis, cervical region: Secondary | ICD-10-CM | POA: Diagnosis not present

## 2018-11-08 DIAGNOSIS — M4712 Other spondylosis with myelopathy, cervical region: Secondary | ICD-10-CM | POA: Diagnosis not present

## 2018-11-11 ENCOUNTER — Other Ambulatory Visit: Payer: Self-pay

## 2018-11-11 DIAGNOSIS — Z20828 Contact with and (suspected) exposure to other viral communicable diseases: Secondary | ICD-10-CM | POA: Diagnosis not present

## 2018-11-11 DIAGNOSIS — Z20822 Contact with and (suspected) exposure to covid-19: Secondary | ICD-10-CM

## 2018-11-12 LAB — NOVEL CORONAVIRUS, NAA: SARS-CoV-2, NAA: NOT DETECTED

## 2018-12-17 DIAGNOSIS — M810 Age-related osteoporosis without current pathological fracture: Secondary | ICD-10-CM | POA: Diagnosis not present

## 2018-12-20 DIAGNOSIS — M4802 Spinal stenosis, cervical region: Secondary | ICD-10-CM | POA: Diagnosis not present

## 2019-01-05 ENCOUNTER — Other Ambulatory Visit: Payer: Self-pay | Admitting: Cardiology

## 2019-01-09 ENCOUNTER — Ambulatory Visit (HOSPITAL_COMMUNITY): Payer: Medicare Other

## 2019-01-09 ENCOUNTER — Other Ambulatory Visit (HOSPITAL_COMMUNITY): Payer: Medicare Other

## 2019-01-09 ENCOUNTER — Ambulatory Visit (HOSPITAL_COMMUNITY): Payer: Medicare Other | Admitting: Hematology

## 2019-01-16 ENCOUNTER — Ambulatory Visit (HOSPITAL_COMMUNITY): Payer: Medicare Other | Admitting: Hematology

## 2019-01-20 ENCOUNTER — Other Ambulatory Visit: Payer: Medicare Other

## 2019-01-27 ENCOUNTER — Telehealth: Payer: Self-pay

## 2019-01-27 DIAGNOSIS — E785 Hyperlipidemia, unspecified: Secondary | ICD-10-CM

## 2019-01-27 NOTE — Telephone Encounter (Signed)
lmom for lipid labs

## 2019-01-27 NOTE — Addendum Note (Signed)
Addended by: Marcelle Overlie D on: 01/27/2019 04:00 PM   Modules accepted: Orders

## 2019-01-27 NOTE — Telephone Encounter (Signed)
Labs scheduled for 1/4

## 2019-01-30 ENCOUNTER — Encounter

## 2019-02-03 ENCOUNTER — Other Ambulatory Visit: Payer: Self-pay

## 2019-02-03 ENCOUNTER — Other Ambulatory Visit: Payer: Medicare Other | Admitting: *Deleted

## 2019-02-03 DIAGNOSIS — E785 Hyperlipidemia, unspecified: Secondary | ICD-10-CM

## 2019-02-03 DIAGNOSIS — E875 Hyperkalemia: Secondary | ICD-10-CM | POA: Diagnosis not present

## 2019-02-03 DIAGNOSIS — I10 Essential (primary) hypertension: Secondary | ICD-10-CM | POA: Diagnosis not present

## 2019-02-03 DIAGNOSIS — M542 Cervicalgia: Secondary | ICD-10-CM | POA: Diagnosis not present

## 2019-02-03 DIAGNOSIS — M546 Pain in thoracic spine: Secondary | ICD-10-CM | POA: Diagnosis not present

## 2019-02-03 DIAGNOSIS — Z0000 Encounter for general adult medical examination without abnormal findings: Secondary | ICD-10-CM | POA: Diagnosis not present

## 2019-02-03 DIAGNOSIS — G8929 Other chronic pain: Secondary | ICD-10-CM | POA: Diagnosis not present

## 2019-02-03 DIAGNOSIS — M810 Age-related osteoporosis without current pathological fracture: Secondary | ICD-10-CM | POA: Diagnosis not present

## 2019-02-03 LAB — LIPID PANEL
Chol/HDL Ratio: 2.7 ratio (ref 0.0–4.4)
Cholesterol, Total: 204 mg/dL — ABNORMAL HIGH (ref 100–199)
HDL: 76 mg/dL (ref >39)
LDL Chol Calc (NIH): 105 mg/dL — ABNORMAL HIGH (ref 0–99)
Triglycerides: 131 mg/dL (ref 0–149)
VLDL Cholesterol Cal: 23 mg/dL (ref 5–40)

## 2019-02-03 LAB — HEPATIC FUNCTION PANEL
ALT: 11 IU/L (ref 0–32)
AST: 24 IU/L (ref 0–40)
Albumin: 4.3 g/dL (ref 3.7–4.7)
Alkaline Phosphatase: 59 IU/L (ref 39–117)
Bilirubin Total: 0.5 mg/dL (ref 0.0–1.2)
Bilirubin, Direct: 0.13 mg/dL (ref 0.00–0.40)
Total Protein: 6.6 g/dL (ref 6.0–8.5)

## 2019-02-04 ENCOUNTER — Telehealth: Payer: Self-pay | Admitting: Pharmacist

## 2019-02-04 MED ORDER — ICOSAPENT ETHYL 1 G PO CAPS: 2.0000 g | ORAL_CAPSULE | Freq: Two times a day (BID) | ORAL | 11 refills | Status: DC

## 2019-02-04 NOTE — Telephone Encounter (Signed)
LDL is stable. Patient has been unwilling to try Praluent in the past. On max dose tolerated statin. Will see if patient is willing to switch from lovaza to icosapent ethyl for better cardiovascular benefit. Appears generic is a tier 1 on feb [employer] plan Left message for patient to return call.

## 2019-02-04 NOTE — Addendum Note (Signed)
Addended by: Marcelle Overlie D on: 02/04/2019 04:52 PM   Modules accepted: Orders

## 2019-02-04 NOTE — Telephone Encounter (Signed)
Patient doing well on rosuvastatin 10mg  alternating with 5mg . Will continue this. LDL is at 105. Appears as though Icosapent ethyl is tier 1. Will switch from lovaza to icosapent ethyl for better cardiovscular risk reduction. Patient advised to call back if there is an issue getting.

## 2019-02-06 NOTE — Telephone Encounter (Signed)
Patient called clinic saying that the Vascepa was $100 copay and she could not afford that. I called pharmacy to make sure they were billing generic and not brand. Pharmacy was billing brand. They have changed to generic and now is $5. Called patient to let her know. She was very Patent attorney.

## 2019-02-13 DIAGNOSIS — Z23 Encounter for immunization: Secondary | ICD-10-CM | POA: Diagnosis not present

## 2019-03-03 ENCOUNTER — Telehealth: Payer: Self-pay | Admitting: Pharmacist

## 2019-03-03 DIAGNOSIS — E785 Hyperlipidemia, unspecified: Secondary | ICD-10-CM

## 2019-03-03 NOTE — Telephone Encounter (Signed)
Pt called clinic - states she is having some mild SOB that she thinks could be from her Vascepa. She previously took Lovaza and tolerated it just fine, no hx of shellfish or seafood allergy. Advised her that the Vascepa should not be causing her breathing issues since the only ingredient in Vascepa was also in her Lovaza. She states she will resume therapy and keep an eye on symptoms. Also scheduled f/u labs per pt request.

## 2019-03-15 DIAGNOSIS — Z23 Encounter for immunization: Secondary | ICD-10-CM | POA: Diagnosis not present

## 2019-04-10 ENCOUNTER — Other Ambulatory Visit (HOSPITAL_COMMUNITY): Payer: Self-pay | Admitting: *Deleted

## 2019-04-10 DIAGNOSIS — R918 Other nonspecific abnormal finding of lung field: Secondary | ICD-10-CM

## 2019-04-10 DIAGNOSIS — D500 Iron deficiency anemia secondary to blood loss (chronic): Secondary | ICD-10-CM

## 2019-04-11 ENCOUNTER — Inpatient Hospital Stay (HOSPITAL_COMMUNITY): Payer: Medicare Other | Attending: Hematology

## 2019-04-11 DIAGNOSIS — R0609 Other forms of dyspnea: Secondary | ICD-10-CM | POA: Insufficient documentation

## 2019-04-11 DIAGNOSIS — Z853 Personal history of malignant neoplasm of breast: Secondary | ICD-10-CM | POA: Insufficient documentation

## 2019-04-11 DIAGNOSIS — N2889 Other specified disorders of kidney and ureter: Secondary | ICD-10-CM | POA: Insufficient documentation

## 2019-04-11 DIAGNOSIS — R918 Other nonspecific abnormal finding of lung field: Secondary | ICD-10-CM | POA: Diagnosis not present

## 2019-04-11 DIAGNOSIS — Z87891 Personal history of nicotine dependence: Secondary | ICD-10-CM | POA: Diagnosis not present

## 2019-04-11 DIAGNOSIS — D500 Iron deficiency anemia secondary to blood loss (chronic): Secondary | ICD-10-CM

## 2019-04-11 DIAGNOSIS — R52 Pain, unspecified: Secondary | ICD-10-CM | POA: Diagnosis not present

## 2019-04-11 LAB — CBC WITH DIFFERENTIAL/PLATELET
Abs Immature Granulocytes: 0.02 10*3/uL (ref 0.00–0.07)
Basophils Absolute: 0 10*3/uL (ref 0.0–0.1)
Basophils Relative: 0 %
Eosinophils Absolute: 0.1 10*3/uL (ref 0.0–0.5)
Eosinophils Relative: 2 %
HCT: 40.3 % (ref 36.0–46.0)
Hemoglobin: 13.4 g/dL (ref 12.0–15.0)
Immature Granulocytes: 0 %
Lymphocytes Relative: 36 %
Lymphs Abs: 1.6 10*3/uL (ref 0.7–4.0)
MCH: 32.8 pg (ref 26.0–34.0)
MCHC: 33.3 g/dL (ref 30.0–36.0)
MCV: 98.5 fL (ref 80.0–100.0)
Monocytes Absolute: 0.4 10*3/uL (ref 0.1–1.0)
Monocytes Relative: 8 %
Neutro Abs: 2.4 10*3/uL (ref 1.7–7.7)
Neutrophils Relative %: 54 %
Platelets: 214 10*3/uL (ref 150–400)
RBC: 4.09 MIL/uL (ref 3.87–5.11)
RDW: 12.3 % (ref 11.5–15.5)
WBC: 4.5 10*3/uL (ref 4.0–10.5)
nRBC: 0 % (ref 0.0–0.2)

## 2019-04-11 LAB — COMPREHENSIVE METABOLIC PANEL
ALT: 11 U/L (ref 0–44)
AST: 20 U/L (ref 15–41)
Albumin: 3.9 g/dL (ref 3.5–5.0)
Alkaline Phosphatase: 40 U/L (ref 38–126)
Anion gap: 8 (ref 5–15)
BUN: 15 mg/dL (ref 8–23)
CO2: 29 mmol/L (ref 22–32)
Calcium: 8.9 mg/dL (ref 8.9–10.3)
Chloride: 102 mmol/L (ref 98–111)
Creatinine, Ser: 0.58 mg/dL (ref 0.44–1.00)
GFR calc Af Amer: >60 mL/min (ref >60)
GFR calc non Af Amer: >60 mL/min (ref >60)
Glucose, Bld: 113 mg/dL — ABNORMAL HIGH (ref 70–99)
Potassium: 4.2 mmol/L (ref 3.5–5.1)
Sodium: 139 mmol/L (ref 135–145)
Total Bilirubin: 0.8 mg/dL (ref 0.3–1.2)
Total Protein: 6.8 g/dL (ref 6.5–8.1)

## 2019-04-11 LAB — LACTATE DEHYDROGENASE: LDH: 158 U/L (ref 98–192)

## 2019-04-14 ENCOUNTER — Other Ambulatory Visit: Payer: Medicare Other | Admitting: *Deleted

## 2019-04-14 ENCOUNTER — Other Ambulatory Visit: Payer: Self-pay

## 2019-04-14 DIAGNOSIS — Z853 Personal history of malignant neoplasm of breast: Secondary | ICD-10-CM | POA: Diagnosis not present

## 2019-04-14 DIAGNOSIS — Z6826 Body mass index (BMI) 26.0-26.9, adult: Secondary | ICD-10-CM | POA: Diagnosis not present

## 2019-04-14 DIAGNOSIS — Z86010 Personal history of colonic polyps: Secondary | ICD-10-CM | POA: Diagnosis not present

## 2019-04-14 DIAGNOSIS — Z124 Encounter for screening for malignant neoplasm of cervix: Secondary | ICD-10-CM | POA: Diagnosis not present

## 2019-04-14 DIAGNOSIS — E785 Hyperlipidemia, unspecified: Secondary | ICD-10-CM | POA: Diagnosis not present

## 2019-04-14 LAB — LIPID PANEL
Chol/HDL Ratio: 2.4 ratio (ref 0.0–4.4)
Cholesterol, Total: 174 mg/dL (ref 100–199)
HDL: 73 mg/dL (ref >39)
LDL Chol Calc (NIH): 79 mg/dL (ref 0–99)
Triglycerides: 126 mg/dL (ref 0–149)
VLDL Cholesterol Cal: 22 mg/dL (ref 5–40)

## 2019-04-16 ENCOUNTER — Encounter (HOSPITAL_COMMUNITY): Payer: Self-pay

## 2019-04-16 ENCOUNTER — Ambulatory Visit (HOSPITAL_COMMUNITY)
Admission: RE | Admit: 2019-04-16 | Discharge: 2019-04-16 | Disposition: A | Discharge status: Home / Self Care | Payer: Medicare Other | Source: Ambulatory Visit | Attending: Internal Medicine | Admitting: Internal Medicine

## 2019-04-16 ENCOUNTER — Other Ambulatory Visit: Payer: Self-pay

## 2019-04-16 DIAGNOSIS — R918 Other nonspecific abnormal finding of lung field: Secondary | ICD-10-CM | POA: Insufficient documentation

## 2019-04-16 DIAGNOSIS — K5730 Diverticulosis of large intestine without perforation or abscess without bleeding: Secondary | ICD-10-CM | POA: Diagnosis not present

## 2019-04-16 DIAGNOSIS — R634 Abnormal weight loss: Secondary | ICD-10-CM | POA: Diagnosis not present

## 2019-04-16 DIAGNOSIS — N289 Disorder of kidney and ureter, unspecified: Secondary | ICD-10-CM | POA: Diagnosis not present

## 2019-04-16 MED ORDER — IOHEXOL 300 MG/ML  SOLN
100.0000 mL | Freq: Once | INTRAMUSCULAR | Status: AC | PRN
  Administered 2019-04-16: 100 mL via INTRAVENOUS

## 2019-04-22 ENCOUNTER — Inpatient Hospital Stay (HOSPITAL_BASED_OUTPATIENT_CLINIC_OR_DEPARTMENT_OTHER): Payer: Medicare Other | Admitting: Hematology

## 2019-04-22 ENCOUNTER — Encounter (HOSPITAL_COMMUNITY): Payer: Self-pay | Admitting: Hematology

## 2019-04-22 ENCOUNTER — Other Ambulatory Visit: Payer: Self-pay

## 2019-04-22 VITALS — BP 131/66 | HR 83 | Temp 96.9°F | Resp 16 | Wt 154.2 lb

## 2019-04-22 DIAGNOSIS — R52 Pain, unspecified: Secondary | ICD-10-CM | POA: Diagnosis not present

## 2019-04-22 DIAGNOSIS — Z853 Personal history of malignant neoplasm of breast: Secondary | ICD-10-CM | POA: Diagnosis not present

## 2019-04-22 DIAGNOSIS — R0609 Other forms of dyspnea: Secondary | ICD-10-CM | POA: Diagnosis not present

## 2019-04-22 DIAGNOSIS — R918 Other nonspecific abnormal finding of lung field: Secondary | ICD-10-CM | POA: Diagnosis not present

## 2019-04-22 DIAGNOSIS — N2889 Other specified disorders of kidney and ureter: Secondary | ICD-10-CM | POA: Diagnosis not present

## 2019-04-22 DIAGNOSIS — Z87891 Personal history of nicotine dependence: Secondary | ICD-10-CM | POA: Diagnosis not present

## 2019-04-22 NOTE — Patient Instructions (Addendum)
[place] at [facility] Discharge Instructions  You were seen today by Dr. Delton Coombes. He went over your recent lab and scan results. He will see you back in one year for labs and follow up.   Thank you for choosing [place] at [facility] to provide your oncology and hematology care.  To afford each patient quality time with our provider, please arrive at least 15 minutes before your scheduled appointment time.   If you have a lab appointment with the [facility] please come in thru the  Main Entrance and check in at the main information desk  You need to re-schedule your appointment should you arrive 10 or more minutes late.  We strive to give you quality time with our providers, and arriving late affects you and other patients whose appointments are after yours.  Also, if you no show three or more times for appointments you may be dismissed from the clinic at the providers discretion.     Again, thank you for choosing [facility].  Our hope is that these requests will decrease the amount of time that you wait before being seen by our physicians.       _____________________________________________________________  Should you have questions after your visit to [facility], please contact our office at [phone] between the hours of 8:00 a.m. and 4:30 p.m.  Voicemails left after 4:00 p.m. will not be returned until the following business day.  For prescription refill requests, have your pharmacy contact our office and allow 72 hours.    Cancer Center Support Programs:   > Cancer Support Group  2nd Tuesday of the month 1pm-2pm, Journey Room

## 2019-04-22 NOTE — Assessment & Plan Note (Addendum)
1.  Bilateral breast cancer: -She underwent bilateral mastectomies 26 years ago. -She also had TRAM flap reconstruction. -Denies any new onset pains.  Physical examination today did not reveal any suspicious nodules or masses.  No axillary adenopathy. -We reviewed her labs including LFTs which are within normal limits.  CBC is normal.  We will plan to repeat labs in 1 year.  2.  Bilateral lung nodules and left kidney lesion: -We reviewed CT CAP from 04/16/2019.  Stable scattered less than 5 mm lung nodules noted bilaterally with no significant change.  No new enlarging nodules identified. -8 mm high attenuation subcapsular lesion arising from the posterior midpole of the left kidney remains stable.  This could represent a hemorrhagic cyst versus low-grade papillary renal cell carcinoma. -We will see her back in 1 year with repeat scans.

## 2019-04-22 NOTE — Progress Notes (Signed)
[place], [place] [address]   CLINIC:  Medical Oncology/Hematology  PCP:  Deland Pretty, [place] [id number] [phone]   REASON FOR VISIT:  Follow-up for breast cancer, lung nodules, kidney mass.  CURRENT THERAPY: Observation.   INTERVAL HISTORY:  Renee [name] 79 y.o. female seen for follow-up of bilateral breast cancer, lung nodules and kidney mass.  Denies any hematuria.  Shortness of breath on exertion is stable.  No new onset pains reported.  She has chronic generalized pains which are stable.  Appetite is 100%.  Energy levels are 25%.  Denies any recent infections or hospitalizations.    REVIEW OF SYSTEMS:  Review of Systems  Respiratory: Positive for shortness of breath.   All other systems reviewed and are negative.    PAST MEDICAL/SURGICAL HISTORY:  Past Medical History:  Diagnosis Date  . Arthritis    oa  . Blood transfusion 1948  . Blood transfusion without reported diagnosis   . Breast cancer ([place]) 1994   bilateral / HX skin cancer  . Cataract    both eyes  . COPD (chronic obstructive pulmonary disease) ([place])   . Disc disease, degenerative, cervical    trouble turing head and neck at times  . Diverticulosis   . DVT (deep venous thrombosis) ([place]) 2002   left leg  . Fibromyalgia   . Hx of skin cancer, basal cell   . Hyperlipidemia   . Hypertension   . Measles as child  . Mumps age 75  . Osteoporosis   . PONV (postoperative nausea and vomiting)   . Sinus tachycardia   . Tubular adenoma of colon    Past Surgical History:  Procedure Laterality Date  . ABDOMINAL HYSTERECTOMY  1976   partial  . achilles tendon adn 2 bone spurs removed Left 05-2012  . [place]   lower  . BASAL CELL CARCINOMA EXCISION    . bilateral mastectomy with tramflap reconstruction  1994  . CARPAL TUNNEL RELEASE  03/2017  . COLONOSCOPY    . KNEE ARTHROSCOPY Right 2008  . KNEE SURGERY   2002   arthroscopy-bilateral  . MASTECTOMY     bilateral  . PARTIAL HYSTERECTOMY    . POLYPECTOMY    . 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 TOTAL KNEE ARTHROPLASTY Right 10/20/2013   Procedure: RIGHT TOTAL KNEE ARTHROPLASTY;  Surgeon: Gearlean Alf, MD;  Location: [facility];  Service: Orthopedics;  Laterality: Right;     SOCIAL HISTORY:  Social History   Socioeconomic History  . Marital status: Married    Spouse name: Not on file  . Number of children: 2  . Years of education: Not on file  . Highest education level: Not on file  Occupational History  . Occupation: RETIRED    Employer: RETIRED  Tobacco Use  . Smoking status: Former Smoker    Packs/day: 1.00    Years: 20.00    Pack years: 20.00    Types: Cigarettes    Quit date: 02/11/1992    Years since quitting: 27.2  . Smokeless tobacco: Never Used  Substance and Sexual Activity  . Alcohol use: Yes    Alcohol/week: 5.0 standard drinks    Types: 5 Glasses of wine per week    Comment: occasional wine  . Drug use:  No  . Sexual activity: Not Currently    Partners: Male  Other Topics Concern  . Not on file  Social History Narrative  . Not on file   Social Determinants of Health   Financial Resource Strain:   . Difficulty of Paying Living Expenses:   Food Insecurity:   . Worried About Charity fundraiser in the Last Year:   . Arboriculturist in the Last Year:   Transportation Needs:   . Film/video editor (Medical):   Marland Kitchen Lack of Transportation (Non-Medical):   Physical Activity:   . Days of Exercise per Week:   . Minutes of Exercise per Session:   Stress:   . Feeling of Stress :   Social Connections:   . Frequency of Communication with Friends and Family:   . Frequency of Social Gatherings with Friends and Family:   . Attends Religious Services:   . Active Member of  Clubs or Organizations:   . Attends Archivist Meetings:   Marland Kitchen Marital Status:   Intimate Partner Violence:   . Fear of Current or Ex-Partner:   . Emotionally Abused:   Marland Kitchen Physically Abused:   . Sexually Abused:     FAMILY HISTORY:  Family History  Problem Relation Age of Onset  . Alzheimer's disease Mother   . Alcohol abuse Father   . Sudden death Father   . Liver disease Father        fatty liver disease  . Other Father        gastritis  . Healthy Brother   . Alcohol abuse Daughter   . COPD Daughter   . Healthy Grandchild   . Heart disease Maternal Grandfather   . CAD Maternal Grandfather   . Sudden death Brother        at birth  . Unexplained death Daughter   . Alcohol abuse Daughter   . Healthy Sister   . Colon cancer Neg Hx   . Colon polyps Neg Hx   . Rectal cancer Neg Hx   . Stomach cancer Neg Hx   . Esophageal cancer Neg Hx     CURRENT MEDICATIONS:  Outpatient Encounter Medications as of 04/22/2019  Medication Sig  . ALPRAZolam (XANAX) 0.25 MG tablet TK 1 T PO BID  . ascorbic acid (VITAMIN C) 100 MG tablet Vitamin C 100 mg tablet  Take twice a day by oral route.  Marland Kitchen aspirin EC 81 MG tablet Take 1 tablet (81 mg total) by mouth daily.  . cholecalciferol (VITAMIN D) 1000 units tablet Take 1,000 Units by mouth daily.  Marland Kitchen denosumab (PROLIA) 60 MG/ML SOSY injection Inject 60 mg into the skin every 6 (six) months.   . DULoxetine (CYMBALTA) 30 MG capsule TK 1 C PO D  . icosapent Ethyl (VASCEPA) 1 g capsule Take 2 capsules (2 g total) by mouth 2 (two) times daily.  . metoprolol tartrate (LOPRESSOR) 50 MG tablet TAKE 1 TABLET BY MOUTH TWICE DAILY  . Multiple Vitamin (MULTIVITAMIN) tablet Take 1 tablet by mouth daily.  . rosuvastatin (CRESTOR) 5 MG tablet Take 2 tablets every other day, alternating with 1 tablet the other days.  Marland Kitchen omega-3 acid ethyl esters (LOVAZA) 1 g capsule Take 2 capsules by mouth 2 (two) times daily.   No facility-administered encounter  medications on file as of 04/22/2019.    ALLERGIES:  Allergies  Allergen Reactions  . Aspirin Nausea Only  . Atorvastatin Other (See Comments)    Pt reports  causes "muscle aches."  . Codeine Itching    Mood changes, can take percocet  . Ibuprofen Hives    Blisters   . Lisinopril Cough  . Repatha [Evolocumab]     Muscle aches  . Rosuvastatin Other (See Comments)    Pt reports causes "muscle aches."     PHYSICAL EXAM:  ECOG Performance status: 1  Vitals:   04/22/19 1100  BP: 131/66  Pulse: 83  Resp: 16  Temp: (!) 96.9 F (36.1 C)  SpO2: 100%   Filed Weights   04/22/19 1100  Weight: 154 lb 4 oz (70 kg)    Physical Exam Vitals reviewed.  Constitutional:      Appearance: Normal appearance.  Cardiovascular:     Rate and Rhythm: Normal rate and regular rhythm.     Heart sounds: Normal heart sounds.  Pulmonary:     Effort: Pulmonary effort is normal.     Breath sounds: Normal breath sounds.  Abdominal:     General: There is no distension.     Palpations: Abdomen is soft. There is no mass.  Lymphadenopathy:     Cervical: No cervical adenopathy.  Skin:    General: Skin is warm.  Neurological:     General: No focal deficit present.     Mental Status: She is alert and oriented to person, place, and time.  Psychiatric:        Mood and Affect: Mood normal.        Behavior: Behavior normal.    Bilateral TRAM flap reconstruction is within normal limits with no suspicious nodules.  LABORATORY DATA:  I have reviewed the labs as listed.  CBC    Component Value Date/Time   WBC 4.5 04/11/2019 0924   RBC 4.09 04/11/2019 0924   HGB 13.4 04/11/2019 0924   HGB 13.7 10/29/2018 1006   HCT 40.3 04/11/2019 0924   HCT 40.2 10/29/2018 1006   PLT 214 04/11/2019 0924   PLT 234 10/29/2018 1006   MCV 98.5 04/11/2019 0924   MCV 94 10/29/2018 1006   MCH 32.8 04/11/2019 0924   MCHC 33.3 04/11/2019 0924   RDW 12.3 04/11/2019 0924   RDW 11.9 10/29/2018 1006   LYMPHSABS 1.6  04/11/2019 0924   MONOABS 0.4 04/11/2019 0924   EOSABS 0.1 04/11/2019 0924   BASOSABS 0.0 04/11/2019 0924   CMP Latest Ref Rng & Units 04/11/2019 02/03/2019 10/29/2018  Glucose 70 - 99 mg/dL 113(H) - 103(H)  BUN 8 - 23 mg/dL 15 - 12  Creatinine 0.44 - 1.00 mg/dL 0.58 - 0.70  Sodium 135 - 145 mmol/L 139 - 138  Potassium 3.5 - 5.1 mmol/L 4.2 - 4.8  Chloride 98 - 111 mmol/L 102 - 100  CO2 22 - 32 mmol/L 29 - 26  Calcium 8.9 - 10.3 mg/dL 8.9 - 8.8  Total Protein 6.5 - 8.1 g/dL 6.8 6.6 6.4  Total Bilirubin 0.3 - 1.2 mg/dL 0.8 0.5 0.5  Alkaline Phos 38 - 126 U/L 40 59 51  AST 15 - 41 U/L 20 24 22   ALT 0 - 44 U/L 11 11 11        DIAGNOSTIC IMAGING:  I have independently reviewed the scans and discussed with the patient.     ASSESSMENT & PLAN:   Multiple pulmonary nodules 1.  Bilateral breast cancer: -She underwent bilateral mastectomies 26 years ago. -She also had TRAM flap reconstruction. -Denies any new onset pains.  Physical examination today did not reveal any suspicious nodules or masses.  No  axillary adenopathy. -We reviewed her labs including LFTs which are within normal limits.  CBC is normal.  We will plan to repeat labs in 1 year.  2.  Bilateral lung nodules and left kidney lesion: -We reviewed CT CAP from 04/16/2019.  Stable scattered less than 5 mm lung nodules noted bilaterally with no significant change.  No new enlarging nodules identified. -8 mm high attenuation subcapsular lesion arising from the posterior midpole of the left kidney remains stable.  This could represent a hemorrhagic cyst versus low-grade papillary renal cell carcinoma. -We will see her back in 1 year with repeat scans.      Orders placed this encounter:  Orders Placed This Encounter  Procedures  . CT Abdomen Pelvis W Contrast  . CT Chest W Contrast  . CBC with Differential  . Comprehensive metabolic panel      Derek Jack, MD [place] [phone]

## 2019-05-16 DIAGNOSIS — M25552 Pain in left hip: Secondary | ICD-10-CM | POA: Diagnosis not present

## 2019-06-17 DIAGNOSIS — M810 Age-related osteoporosis without current pathological fracture: Secondary | ICD-10-CM | POA: Diagnosis not present

## 2019-06-23 DIAGNOSIS — M62838 Other muscle spasm: Secondary | ICD-10-CM | POA: Diagnosis not present

## 2019-07-09 ENCOUNTER — Other Ambulatory Visit: Payer: Self-pay | Admitting: Cardiology

## 2019-07-10 ENCOUNTER — Other Ambulatory Visit: Payer: Self-pay | Admitting: Cardiology

## 2019-08-06 ENCOUNTER — Other Ambulatory Visit (HOSPITAL_COMMUNITY): Payer: Medicare Other

## 2019-08-06 ENCOUNTER — Ambulatory Visit (HOSPITAL_COMMUNITY): Payer: Medicare Other

## 2019-08-12 ENCOUNTER — Ambulatory Visit (HOSPITAL_COMMUNITY): Payer: Medicare Other | Admitting: Hematology

## 2019-08-18 NOTE — Progress Notes (Signed)
Cardiology Office Note    Date:  08/20/2019   ID:  Renee [name], DOB 1940-04-22, MRN [id number]  PCP:  Deland Pretty, MD  Cardiologist: Ena Dawley, MD EPS: None  No chief complaint on file.   History of Present Illness:  Renee [name] is a 79 y.o. female with a hx of coronary calcification on chest CT suggestive of CAD, dyspnea on exertion, HTN, HLD, DVT, and COPD. Carotid duplex in June 2017 showed stable 1-39% right ICA stenosis and stable 40-59% left ICA stenosis. Patient has a history of multiple statin intolerances and  Is followed at our lipid clinic   Patient had telemedicine visit with Dr. [name] 07/24/2018 at which time she was tolerating low-dose Crestor.  Patient comes in for f/u. Had to stop Vascepa-says it caused her to be short of breath with little activity. When she stopped it everything resolved. She tried taking it again and the symptoms returned. No regular exercise.   Past Medical History:  Diagnosis Date  . Arthritis    oa  . Blood transfusion 1948  . Blood transfusion without reported diagnosis   . Breast cancer ([place]) 1994   bilateral / HX skin cancer  . Cataract    both eyes  . COPD (chronic obstructive pulmonary disease) ([place])   . Disc disease, degenerative, cervical    trouble turing head and neck at times  . Diverticulosis   . DVT (deep venous thrombosis) ([place]) 2002   left leg  . Fibromyalgia   . Hx of skin cancer, basal cell   . Hyperlipidemia   . Hypertension   . Measles as child  . Mumps age 51  . Osteoporosis   . PONV (postoperative nausea and vomiting)   . Sinus tachycardia   . Tubular adenoma of colon     Past Surgical History:  Procedure Laterality Date  . ABDOMINAL HYSTERECTOMY  1976   partial  . achilles tendon adn 2 bone spurs removed Left 05-2012  . [place]   lower  . BASAL CELL CARCINOMA EXCISION    . bilateral mastectomy with tramflap reconstruction  1994  . CARPAL TUNNEL RELEASE  03/2017  .  COLONOSCOPY    . KNEE ARTHROSCOPY Right 2008  . KNEE SURGERY  2002   arthroscopy-bilateral  . MASTECTOMY     bilateral  . PARTIAL HYSTERECTOMY    . POLYPECTOMY    . 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 TOTAL KNEE ARTHROPLASTY Right 10/20/2013   Procedure: RIGHT TOTAL KNEE ARTHROPLASTY;  Surgeon: Gearlean Alf, MD;  Location: [facility];  Service: Orthopedics;  Laterality: Right;    Current Medications: Current Meds  Medication Sig  . ALPRAZolam (XANAX) 0.25 MG tablet TK 1 T PO BID  . ascorbic acid (VITAMIN C) 100 MG tablet Vitamin C 100 mg tablet  Take twice a day by oral route.  Marland Kitchen aspirin EC 81 MG tablet Take 1 tablet (81 mg total) by mouth daily.  . cholecalciferol (VITAMIN D) 1000 units tablet Take 1,000 Units by mouth daily.  Marland Kitchen denosumab (PROLIA) 60 MG/ML SOSY injection Inject 60 mg into the skin every 6 (six) months.   . DULoxetine (CYMBALTA) 30 MG capsule TK 1 C PO D  . metoprolol tartrate (LOPRESSOR) 50 MG tablet Take 1 tablet (50 mg total) by mouth 2 (two) times daily.  Marland Kitchen  Multiple Vitamin (MULTIVITAMIN) tablet Take 1 tablet by mouth daily.  Marland Kitchen omega-3 acid ethyl esters (LOVAZA) 1 g capsule Take 2 capsules by mouth 2 (two) times daily.  . rosuvastatin (CRESTOR) 5 MG tablet Take 2 tablets every other day, alternating with 1 tablet the other days.  . [DISCONTINUED] metoprolol tartrate (LOPRESSOR) 50 MG tablet Take 1 tablet (50 mg total) by mouth 2 (two) times daily. Please make overdue appt with Dr. [name] before anymore refills. 1st attempt     Allergies:   Aspirin, Atorvastatin, Codeine, Ibuprofen, Lisinopril, Repatha [evolocumab], and Rosuvastatin   Social History   Socioeconomic History  . Marital status: Married    Spouse name: Not on file  . Number of children: 2  . Years of education: Not on file  . Highest education level: Not on  file  Occupational History  . Occupation: RETIRED    Employer: RETIRED  Tobacco Use  . Smoking status: Former Smoker    Packs/day: 1.00    Years: 20.00    Pack years: 20.00    Types: Cigarettes    Quit date: 02/11/1992    Years since quitting: 27.5  . Smokeless tobacco: Never Used  Vaping Use  . Vaping Use: Never used  Substance and Sexual Activity  . Alcohol use: Yes    Alcohol/week: 5.0 standard drinks    Types: 5 Glasses of wine per week    Comment: occasional wine  . Drug use: No  . Sexual activity: Not Currently    Partners: Male  Other Topics Concern  . Not on file  Social History Narrative  . Not on file   Social Determinants of Health   Financial Resource Strain:   . Difficulty of Paying Living Expenses:   Food Insecurity:   . Worried About Charity fundraiser in the Last Year:   . Arboriculturist in the Last Year:   Transportation Needs:   . Film/video editor (Medical):   Marland Kitchen Lack of Transportation (Non-Medical):   Physical Activity:   . Days of Exercise per Week:   . Minutes of Exercise per Session:   Stress:   . Feeling of Stress :   Social Connections:   . Frequency of Communication with Friends and Family:   . Frequency of Social Gatherings with Friends and Family:   . Attends Religious Services:   . Active Member of Clubs or Organizations:   . Attends Archivist Meetings:   Marland Kitchen Marital Status:      Family History:  The patient's family history includes Alcohol abuse in her daughter, daughter, and father; Alzheimer's disease in her mother; CAD in her maternal grandfather; COPD in her daughter; Healthy in her brother, grandchild, and sister; Heart disease in her maternal grandfather; Liver disease in her father; Other in her father; Sudden death in her brother and father; Unexplained death in her daughter.   ROS:   Please see the history of present illness.    ROS All other systems reviewed and are negative.   PHYSICAL EXAM:   VS:  BP  130/78   Pulse 69   Ht 5\' 5"  (1.651 m)   Wt 163 lb (73.9 kg)   BMI 27.12 kg/m   Physical Exam  GEN: Well nourished, well developed, in no acute distress  Neck: no JVD, carotid bruits, or masses Cardiac:RRR; no murmurs, rubs, or gallops  Respiratory:  clear to auscultation bilaterally, normal work of breathing GI: soft, nontender, nondistended, + BS Ext: without  cyanosis, clubbing, or edema, Good distal pulses bilaterally Neuro:  Alert and Oriented x 3 Psych: euthymic mood, full affect  Wt Readings from Last 3 Encounters:  08/20/19 163 lb (73.9 kg)  04/22/19 154 lb 4 oz (70 kg)  10/24/18 143 lb (64.9 kg)      Studies/Labs Reviewed:   EKG:  EKG is  ordered today.  The ekg ordered today demonstrates NSR  Recent Labs: 10/29/2018: TSH 2.600 04/11/2019: ALT 11; BUN 15; Creatinine, Ser 0.58; Hemoglobin 13.4; Platelets 214; Potassium 4.2; Sodium 139   Lipid Panel    Component Value Date/Time   CHOL 174 04/14/2019 1026   TRIG 126 04/14/2019 1026   HDL 73 04/14/2019 1026   CHOLHDL 2.4 04/14/2019 1026   CHOLHDL 5.3 (H) 10/19/2015 0813   VLDL 44 (H) 10/19/2015 0813   LDLCALC 79 04/14/2019 1026   LDLDIRECT 124.0 08/17/2014 0909    Additional studies/ records that were reviewed today include:   Chest CT 03/2019 IMPRESSION: 1. Stable tiny pulmonary nodules are consistent with benign postinflammatory etiology. No evidence of metastatic disease. 2. Stable 8 mm high attenuation left renal lesion, which could represent a hemorrhagic cyst or low-grade papillary renal cell carcinoma. Abdomen MRI without and with contrast is recommended for more definitive characterization. 3. Colonic diverticulosis. No radiographic evidence of diverticulitis.     Electronically Signed   By: Marlaine Hind M.D.   On: 04/16/2019 11:43   LE venous Duplex  - No evidence of deep vein or superficial thrombosis involving the left lower extremity and right common femoral vein. An enlarged inguinal  lymph node is noted on the left. - No evidence of Baker's cyst on the left. Other specific details can be found in the table(s) above. Prepared and Electronically Authenticated by  Elam Dutch [id number]   CT PE IMPRESSION: Negative for pulmonary embolism. No acute chest abnormalities.  Several punctate pulmonary nodules in both lungs. Nodules measure between 2-3 mm and are nonspecific. If the patient is at high risk for bronchogenic carcinoma, follow-up chest CT at 1year is recommended. If the patient is at low risk, no follow-up is needed. This recommendation follows the consensus statement: Guidelines for Management of Small Pulmonary Nodules Detected on CT Scans: A Statement from the [place] as published in Radiology 2005; 237:395-400.  Electronically Signed By: Markus Daft M.D. On: 11/19/2012 15:52   TTE 12/03/2012 Study Conclusions  - Left ventricle: The cavity size was normal. Wall thickness was increased in a pattern of moderate LVH. Systolic function was normal. The estimated ejection fraction was in the range of 60% to 65%. - Atrial septum: No defect or patent foramen ovale was identified. - Pericardium, extracardiac: A trivial pericardial effusion was identified.     Exercise nuclear stress test: 12/24/12   Quantitative Gated Spect Images  QGS EDV: 60 ml  QGS ESV: 17 ml  Impression  Exercise Capacity: Poor exercise capacity.  BP Response: Hypertensive blood pressure response.  Clinical Symptoms: Dyspnea  ECG Impression: PACs, ventricular couplet. No ischemic ST segment changes.  Comparison with Prior Nuclear Study: No significant change from previous study  Overall Impression: Normal stress nuclear study.  LV Ejection Fraction: 72%. LV Wall Motion: NL LV Function; NL Wall Motion  Loralie Champagne  12/24/2012   Exercise nuclear stress test: 07/2015  Nuclear stress EF: 83%.  Blood pressure demonstrated a hypertensive response  to exercise.  Horizontal ST segment depression ST segment depression was noted during stress in the II, III, aVF, V4, V5  and V6 leads.  This is a low risk study.  The left ventricular ejection fraction is hyperdynamic (>65%).   Poor exercise tolerance achieving less than 5 METS with HTN respnse Positive ECG Perfusion images with breast attenuation no ischemia or infarct EF 83%       ASSESSMENT:    1. Coronary artery disease involving native coronary artery of native heart without angina pectoris   2. Mixed hyperlipidemia      PLAN:  In order of problems listed above:  Coronary calcification on chest CT-on Crestor-didn't tolerate vascepa but lipids good off of it in March. 150 min exercise weekly. F/u in 1 yr.  HLD on Crestor LDL 79 03/2019    Medication Adjustments/Labs and Tests Ordered: Current medicines are reviewed at length with the patient today.  Concerns regarding medicines are outlined above.  Medication changes, Labs and Tests ordered today are listed in the Patient Instructions below. Patient Instructions  Medication Instructions:  Your physician recommends that you continue on your current medications as directed. Please refer to the Current Medication list given to you today.  *If you need a refill on your cardiac medications before your next appointment, please call your pharmacy*   Lab Work: None If you have labs (blood work) drawn today and your tests are completely normal, you will receive your results only by: Marland Kitchen MyChart Message (if you have MyChart) OR . A paper copy in the mail If you have any lab test that is abnormal or we need to change your treatment, we will call you to review the results.   Testing/Procedures: None   Follow-Up: At [facility], you and your health needs are our priority.  As part of our continuing mission to provide you with exceptional heart care, we have created designated Provider Care Teams.  These Care Teams include  your primary Cardiologist (physician) and Advanced Practice Providers (APPs -  Physician Assistants and Nurse Practitioners) who all work together to provide you with the care you need, when you need it.  We recommend signing up for the patient portal called "MyChart".  Sign up information is provided on this After Visit Summary.  MyChart is used to connect with patients for Virtual Visits (Telemedicine).  Patients are able to view lab/test results, encounter notes, upcoming appointments, etc.  Non-urgent messages can be sent to your provider as well.   To learn more about what you can do with MyChart, go to [email or web address].    Your next appointment:   1 year(s)  The format for your next appointment:   In Person  Provider:   You may see Ena Dawley, MD or one of the following Advanced Practice Providers on your designated Care Team:    Melina Copa, PA-C  Ermalinda Barrios, PA-C    Other Instructions  Your provider recommends that you maintain 150 minutes per week of moderate aerobic activity.    Sumner Boast, PA-C  08/20/2019 10:04 AM    [facility] [place], [place], [place]  [address] Phone: [phone]; Fax: [phone]

## 2019-08-20 ENCOUNTER — Ambulatory Visit (INDEPENDENT_AMBULATORY_CARE_PROVIDER_SITE_OTHER): Payer: Medicare Other | Admitting: Physician Assistant

## 2019-08-20 ENCOUNTER — Other Ambulatory Visit: Payer: Self-pay

## 2019-08-20 ENCOUNTER — Encounter: Payer: Self-pay | Admitting: Physician Assistant

## 2019-08-20 VITALS — BP 130/78 | HR 69 | Ht 65.0 in | Wt 163.0 lb

## 2019-08-20 DIAGNOSIS — I2510 Atherosclerotic heart disease of native coronary artery without angina pectoris: Secondary | ICD-10-CM

## 2019-08-20 DIAGNOSIS — E782 Mixed hyperlipidemia: Secondary | ICD-10-CM | POA: Diagnosis not present

## 2019-08-20 MED ORDER — METOPROLOL TARTRATE 50 MG PO TABS: 50.0000 mg | ORAL_TABLET | Freq: Two times a day (BID) | ORAL | 3 refills | Status: DC

## 2019-08-20 NOTE — Patient Instructions (Addendum)
Medication Instructions:  Your physician recommends that you continue on your current medications as directed. Please refer to the Current Medication list given to you today.  *If you need a refill on your cardiac medications before your next appointment, please call your pharmacy*   Lab Work: None If you have labs (blood work) drawn today and your tests are completely normal, you will receive your results only by: Marland Kitchen MyChart Message (if you have MyChart) OR . A paper copy in the mail If you have any lab test that is abnormal or we need to change your treatment, we will call you to review the results.   Testing/Procedures: None   Follow-Up: At [facility], you and your health needs are our priority.  As part of our continuing mission to provide you with exceptional heart care, we have created designated Provider Care Teams.  These Care Teams include your primary Cardiologist (physician) and Advanced Practice Providers (APPs -  Physician Assistants and Nurse Practitioners) who all work together to provide you with the care you need, when you need it.  We recommend signing up for the patient portal called "MyChart".  Sign up information is provided on this After Visit Summary.  MyChart is used to connect with patients for Virtual Visits (Telemedicine).  Patients are able to view lab/test results, encounter notes, upcoming appointments, etc.  Non-urgent messages can be sent to your provider as well.   To learn more about what you can do with MyChart, go to [email or web address].    Your next appointment:   1 year(s)  The format for your next appointment:   In Person  Provider:   You may see Ena Dawley, MD or one of the following Advanced Practice Providers on your designated Care Team:    Melina Copa, PA-C  Ermalinda Barrios, PA-C    Other Instructions  Your provider recommends that you maintain 150 minutes per week of moderate aerobic activity.

## 2019-09-01 ENCOUNTER — Other Ambulatory Visit: Payer: Self-pay | Admitting: Cardiology

## 2019-09-12 DIAGNOSIS — M25552 Pain in left hip: Secondary | ICD-10-CM | POA: Diagnosis not present

## 2019-09-12 DIAGNOSIS — M47896 Other spondylosis, lumbar region: Secondary | ICD-10-CM | POA: Diagnosis not present

## 2019-09-12 DIAGNOSIS — M25551 Pain in right hip: Secondary | ICD-10-CM | POA: Diagnosis not present

## 2019-09-30 ENCOUNTER — Ambulatory Visit: Payer: Medicare Other | Admitting: Emergency Medicine

## 2019-10-16 DIAGNOSIS — K5730 Diverticulosis of large intestine without perforation or abscess without bleeding: Secondary | ICD-10-CM | POA: Diagnosis not present

## 2019-10-16 DIAGNOSIS — R109 Unspecified abdominal pain: Secondary | ICD-10-CM | POA: Diagnosis not present

## 2019-10-17 ENCOUNTER — Other Ambulatory Visit (HOSPITAL_COMMUNITY): Payer: Self-pay | Admitting: Internal Medicine

## 2019-10-17 DIAGNOSIS — R109 Unspecified abdominal pain: Secondary | ICD-10-CM

## 2019-10-20 ENCOUNTER — Other Ambulatory Visit: Payer: Self-pay

## 2019-10-20 ENCOUNTER — Ambulatory Visit (HOSPITAL_COMMUNITY)
Admission: RE | Admit: 2019-10-20 | Discharge: 2019-10-20 | Disposition: A | Discharge status: Home / Self Care | Payer: Medicare Other | Source: Ambulatory Visit | Attending: Internal Medicine | Admitting: Internal Medicine

## 2019-10-20 DIAGNOSIS — K760 Fatty (change of) liver, not elsewhere classified: Secondary | ICD-10-CM | POA: Diagnosis not present

## 2019-10-20 DIAGNOSIS — R109 Unspecified abdominal pain: Secondary | ICD-10-CM | POA: Insufficient documentation

## 2019-10-20 MED ORDER — IOHEXOL 300 MG/ML  SOLN
100.0000 mL | Freq: Once | INTRAMUSCULAR | Status: AC | PRN
  Administered 2019-10-20: 100 mL via INTRAVENOUS

## 2019-10-21 ENCOUNTER — Telehealth: Payer: Self-pay | Admitting: Internal Medicine

## 2019-10-21 NOTE — Telephone Encounter (Signed)
Pt scheduled to see Alonza Bogus PA 11/19/19@2pm . Pt also added to the cancellation list. Pt aware of appt. States she may have to do something sooner elsewhere.

## 2019-10-24 DIAGNOSIS — R109 Unspecified abdominal pain: Secondary | ICD-10-CM | POA: Diagnosis not present

## 2019-10-24 DIAGNOSIS — K859 Acute pancreatitis without necrosis or infection, unspecified: Secondary | ICD-10-CM | POA: Diagnosis not present

## 2019-10-30 DIAGNOSIS — K8501 Idiopathic acute pancreatitis with uninfected necrosis: Secondary | ICD-10-CM | POA: Diagnosis not present

## 2019-10-30 DIAGNOSIS — Z23 Encounter for immunization: Secondary | ICD-10-CM | POA: Diagnosis not present

## 2019-11-19 ENCOUNTER — Ambulatory Visit: Payer: Medicare Other | Admitting: Gastroenterology

## 2019-12-22 DIAGNOSIS — M810 Age-related osteoporosis without current pathological fracture: Secondary | ICD-10-CM | POA: Diagnosis not present

## 2020-01-16 DIAGNOSIS — M545 Low back pain, unspecified: Secondary | ICD-10-CM | POA: Diagnosis not present

## 2020-01-16 DIAGNOSIS — M1612 Unilateral primary osteoarthritis, left hip: Secondary | ICD-10-CM | POA: Diagnosis not present

## 2020-02-04 DIAGNOSIS — H5203 Hypermetropia, bilateral: Secondary | ICD-10-CM | POA: Diagnosis not present

## 2020-02-04 DIAGNOSIS — I10 Essential (primary) hypertension: Secondary | ICD-10-CM | POA: Diagnosis not present

## 2020-02-04 DIAGNOSIS — H25013 Cortical age-related cataract, bilateral: Secondary | ICD-10-CM | POA: Diagnosis not present

## 2020-02-09 DIAGNOSIS — I700 Atherosclerosis of aorta: Secondary | ICD-10-CM | POA: Diagnosis not present

## 2020-02-09 DIAGNOSIS — E7800 Pure hypercholesterolemia, unspecified: Secondary | ICD-10-CM | POA: Diagnosis not present

## 2020-02-09 DIAGNOSIS — R918 Other nonspecific abnormal finding of lung field: Secondary | ICD-10-CM | POA: Diagnosis not present

## 2020-02-09 DIAGNOSIS — E118 Type 2 diabetes mellitus with unspecified complications: Secondary | ICD-10-CM | POA: Diagnosis not present

## 2020-02-09 DIAGNOSIS — K219 Gastro-esophageal reflux disease without esophagitis: Secondary | ICD-10-CM | POA: Diagnosis not present

## 2020-02-09 DIAGNOSIS — Z0001 Encounter for general adult medical examination with abnormal findings: Secondary | ICD-10-CM | POA: Diagnosis not present

## 2020-02-09 DIAGNOSIS — J449 Chronic obstructive pulmonary disease, unspecified: Secondary | ICD-10-CM | POA: Diagnosis not present

## 2020-02-09 DIAGNOSIS — I2510 Atherosclerotic heart disease of native coronary artery without angina pectoris: Secondary | ICD-10-CM | POA: Diagnosis not present

## 2020-02-09 DIAGNOSIS — I6529 Occlusion and stenosis of unspecified carotid artery: Secondary | ICD-10-CM | POA: Diagnosis not present

## 2020-02-09 DIAGNOSIS — I10 Essential (primary) hypertension: Secondary | ICD-10-CM | POA: Diagnosis not present

## 2020-02-09 DIAGNOSIS — I2584 Coronary atherosclerosis due to calcified coronary lesion: Secondary | ICD-10-CM | POA: Diagnosis not present

## 2020-03-23 DIAGNOSIS — H2511 Age-related nuclear cataract, right eye: Secondary | ICD-10-CM | POA: Diagnosis not present

## 2020-03-23 DIAGNOSIS — H25013 Cortical age-related cataract, bilateral: Secondary | ICD-10-CM | POA: Diagnosis not present

## 2020-03-23 DIAGNOSIS — H2513 Age-related nuclear cataract, bilateral: Secondary | ICD-10-CM | POA: Diagnosis not present

## 2020-03-23 DIAGNOSIS — H18413 Arcus senilis, bilateral: Secondary | ICD-10-CM | POA: Diagnosis not present

## 2020-03-23 DIAGNOSIS — H25043 Posterior subcapsular polar age-related cataract, bilateral: Secondary | ICD-10-CM | POA: Diagnosis not present

## 2020-03-24 DIAGNOSIS — M791 Myalgia, unspecified site: Secondary | ICD-10-CM | POA: Diagnosis not present

## 2020-03-24 DIAGNOSIS — T466X5A Adverse effect of antihyperlipidemic and antiarteriosclerotic drugs, initial encounter: Secondary | ICD-10-CM | POA: Diagnosis not present

## 2020-03-24 DIAGNOSIS — E7800 Pure hypercholesterolemia, unspecified: Secondary | ICD-10-CM | POA: Diagnosis not present

## 2020-03-24 DIAGNOSIS — I10 Essential (primary) hypertension: Secondary | ICD-10-CM | POA: Diagnosis not present

## 2020-03-24 DIAGNOSIS — M810 Age-related osteoporosis without current pathological fracture: Secondary | ICD-10-CM | POA: Diagnosis not present

## 2020-03-24 DIAGNOSIS — E118 Type 2 diabetes mellitus with unspecified complications: Secondary | ICD-10-CM | POA: Diagnosis not present

## 2020-03-24 DIAGNOSIS — I2584 Coronary atherosclerosis due to calcified coronary lesion: Secondary | ICD-10-CM | POA: Diagnosis not present

## 2020-04-07 ENCOUNTER — Encounter: Payer: Self-pay | Admitting: *Deleted

## 2020-04-19 ENCOUNTER — Ambulatory Visit (INDEPENDENT_AMBULATORY_CARE_PROVIDER_SITE_OTHER): Payer: Medicare Other | Admitting: Internal Medicine

## 2020-04-19 ENCOUNTER — Other Ambulatory Visit (INDEPENDENT_AMBULATORY_CARE_PROVIDER_SITE_OTHER): Payer: Medicare Other

## 2020-04-19 ENCOUNTER — Encounter: Payer: Self-pay | Admitting: Internal Medicine

## 2020-04-19 VITALS — BP 126/70 | HR 74 | Ht 64.0 in | Wt 166.8 lb

## 2020-04-19 DIAGNOSIS — Z86010 Personal history of colonic polyps: Secondary | ICD-10-CM

## 2020-04-19 DIAGNOSIS — K862 Cyst of pancreas: Secondary | ICD-10-CM

## 2020-04-19 DIAGNOSIS — Z8719 Personal history of other diseases of the digestive system: Secondary | ICD-10-CM | POA: Diagnosis not present

## 2020-04-19 LAB — CBC WITH DIFFERENTIAL/PLATELET
Basophils Absolute: 0 10*3/uL (ref 0.0–0.1)
Basophils Relative: 0.5 % (ref 0.0–3.0)
Eosinophils Absolute: 0 10*3/uL (ref 0.0–0.7)
Eosinophils Relative: 0.5 % (ref 0.0–5.0)
HCT: 41.9 % (ref 36.0–46.0)
Hemoglobin: 14.4 g/dL (ref 12.0–15.0)
Lymphocytes Relative: 24.6 % (ref 12.0–46.0)
Lymphs Abs: 2.1 10*3/uL (ref 0.7–4.0)
MCHC: 34.4 g/dL (ref 30.0–36.0)
MCV: 97.7 fl (ref 78.0–100.0)
Monocytes Absolute: 0.7 10*3/uL (ref 0.1–1.0)
Monocytes Relative: 8.2 % (ref 3.0–12.0)
Neutro Abs: 5.5 10*3/uL (ref 1.4–7.7)
Neutrophils Relative %: 66.2 % (ref 43.0–77.0)
Platelets: 241 10*3/uL (ref 150.0–400.0)
RBC: 4.29 Mil/uL (ref 3.87–5.11)
RDW: 13.2 % (ref 11.5–15.5)
WBC: 8.4 10*3/uL (ref 4.0–10.5)

## 2020-04-19 LAB — COMPREHENSIVE METABOLIC PANEL
ALT: 8 U/L (ref 0–35)
AST: 17 U/L (ref 0–37)
Albumin: 4.5 g/dL (ref 3.5–5.2)
Alkaline Phosphatase: 55 U/L (ref 39–117)
BUN: 10 mg/dL (ref 6–23)
CO2: 31 mEq/L (ref 19–32)
Calcium: 9.4 mg/dL (ref 8.4–10.5)
Chloride: 99 mEq/L (ref 96–112)
Creatinine, Ser: 0.69 mg/dL (ref 0.40–1.20)
GFR: 82.4 mL/min (ref >60.00)
Glucose, Bld: 99 mg/dL (ref 70–99)
Potassium: 4.8 mEq/L (ref 3.5–5.1)
Sodium: 136 mEq/L (ref 135–145)
Total Bilirubin: 0.8 mg/dL (ref 0.2–1.2)
Total Protein: 7.2 g/dL (ref 6.0–8.3)

## 2020-04-19 LAB — LIPASE: Lipase: 28 U/L (ref 11.0–59.0)

## 2020-04-19 LAB — TRIGLYCERIDES: Triglycerides: 198 mg/dL — ABNORMAL HIGH (ref 0.0–149.0)

## 2020-04-19 NOTE — Patient Instructions (Addendum)
Your provider has requested that you go to the basement level for lab work before leaving today. Press "B" on the elevator. The lab is located at the first door on the left as you exit the elevator.  Due to recent changes in healthcare laws, you may see the results of your imaging and laboratory studies on MyChart before your provider has had a chance to review them.  We understand that in some cases there may be results that are confusing or concerning to you. Not all laboratory results come back in the same time frame and the provider may be waiting for multiple results in order to interpret others.  Please give [place] 48 hours in order for your provider to thoroughly review all the results before contacting the office for clarification of your results.   You have been scheduled for an MRI/MRCP at [facility] on 04/29/20. Your appointment time is 8:00 am. Please arrive 30 minutes prior to your appointment time for registration purposes. Please make certain not to have anything to eat or drink after midnight. In addition, if you have any metal in your body, have a pacemaker or defibrillator, please be sure to let your ordering physician know. This test typically takes 45 minutes to 1 hour to complete. Should you need to reschedule, please call [phone] to do so.  If you are age 80 or older, your body mass index should be between 23-30. Your Body mass index is 28.63 kg/m. If this is out of the aforementioned range listed, please consider follow up with your Primary Care Provider.  It was a pleasure to see you today!  Dr. Ulice Dash Pyrtle

## 2020-04-19 NOTE — Progress Notes (Signed)
Subjective:    Patient ID: Renee [name], female    DOB: 04-Oct-1940, 80 y.o.   MRN: [id number]  HPI Renee [name] is a 80 year old female with a history of adenomatous colon polyps, colonic diverticulosis, breast cancer with prior mastectomy and reconstruction, remote DVT, hypertension, hyperlipidemia and mild COPD who is seen to evaluate pancreatitis and pancreatic cyst by CT.  She is here alone today.  She was last here in July 2020 for her surveillance colonoscopy.  She reports that in September she developed about a week of progressively worsening eventually severe mid abdominal pain associated with nausea.  Worse with eating.  Started somewhat slowly but became constant.  Was treated conservatively but diagnosed with idiopathic pancreatitis as seen by CT scan.  This was performed on 10/20/2019.  This was reviewed today.  It showed mild acute edematous pancreatitis.  There was a 9 mm cyst in the pancreatic body which was felt to possibly communicate with the main pancreatic duct.  The cyst was mildly enlarged from 2019.  The adjacent main duct was upper limit of normal in diameter.  The liver was fatty but there was no evidence of biliary obstruction or stone.  The bowel was unremarkable with exception of diverticulosis.  She reports that the abdominal pain resolved entirely.  She will occasionally have less than a minute of sharp abdominal pain but nothing like before.  She has some intermittent nausea symptom but no heartburn, dysphagia or odynophagia.  Bowel movements have been regular without blood or melena.  She is taking as needed alprazolam, baby aspirin, vitamin D, duloxetine, metoprolol, Prolia injections every 6 months, rosuvastatin and evolocumab.  She does not drink alcohol.  No family history of pancreatitis that she is aware of  Review of Systems As per HPI, otherwise negative  Current Medications, Allergies, Past Medical History, Past Surgical History, Family History and  Social History were reviewed in [employer] record.     Objective:   Physical Exam BP 126/70   Pulse 74   Ht 5\' 4"  (1.626 m)   Wt 166 lb 12.8 oz (75.7 kg)   SpO2 98%   BMI 28.63 kg/m  Gen: awake, alert, NAD HEENT: anicteric, op clear CV: RRR, no mrg Pulm: CTA b/l Abd: soft, NT/ND, +BS throughout Ext: no c/c/e Neuro: nonfocal   CT ABDOMEN AND PELVIS WITH CONTRAST   TECHNIQUE: Multidetector CT imaging of the abdomen and pelvis was performed using the standard protocol following bolus administration of intravenous contrast.   CONTRAST:  14mL OMNIPAQUE IOHEXOL 300 MG/ML  SOLN   COMPARISON:  06/16/2019   FINDINGS: Lower chest:  No contributory findings.   Hepatobiliary: Hepatic steatosis.No evidence of biliary obstruction or stone.   Pancreas: Peripancreatic edema with mild generalized pancreatic expansion involving the body and tail. There is a cystic density at the level of the body measuring 9 mm, in retrospect stable from comparison. On sagittal reformats this appears contiguous with a mildly prominent but stable pancreatic duct (3 mm in diameter). No discrete mass lesion is seen.   Spleen: Unremarkable.   Adrenals/Urinary Tract: Negative adrenals. No hydronephrosis or stone. 9 mm lesion exophytic from the interpolar left kidney is stable and consistent with hemorrhagic cyst by remote MRI. Unremarkable bladder.   Stomach/Bowel: No obstruction. No visible bowel inflammation. Extensive distal colonic diverticulosis   Vascular/Lymphatic: No acute vascular abnormality. No mass or adenopathy.   Reproductive:No pathologic findings.   Other: No ascites or pneumoperitoneum.   Musculoskeletal: No acute  abnormalities. Postoperative abdominal wall, possibly for breast reconstruction. Generalized disc and facet degeneration in the visualized spine.   IMPRESSION: 1. Mild acute edematous pancreatitis which can be confirmed with labs. 2. 9  mm cyst in the pancreatic body which may communicate with the main duct. The cyst has mildly enlarged from 2019 in the adjacent main pancreatic duct is upper normal diameter. If appropriate for comorbidities, consider follow-up after resolution of pancreatitis.     Electronically Signed   By: Monte Fantasia M.D.   On: 10/20/2019 09:43   Remote abd [place] complete 2010 -- no gallstones     Assessment & Plan:  80 year old female with a history of adenomatous colon polyps, colonic diverticulosis, breast cancer with prior mastectomy and reconstruction, remote DVT, hypertension, hyperlipidemia and mild COPD who is seen to evaluate pancreatitis and pancreatic cyst by CT.   1.  Idiopathic pancreatitis --pancreatitis by imaging and symptoms in September 2021.  Clinically this has resolved.  No definitive medications with strong pancreatitis association.  She does not drink alcohol.  She is not known to have gallstones.  We discussed pancreatitis at length today including the possible etiologies.  I would like to perform lab testing today as below and also repeat cross-sectional imaging. --CBC, CMP, lipase and her glycerides --MRI abdomen with and without contrast plus MRCP; pancreas protocol --She is asked to call me immediately if she has recurrent severe upper abdominal pain similar to the attack from September 2021  2.  9 mm pancreatic body cyst possibly communicating with the main pancreatic duct --this raises the question of IPMN.  Warrants repeat dedicated imaging and we are proceeding with MRI plus MRCP as above.  We reviewed pancreatic anatomy together today.  3.  History of multiple adenomatous colon polyps --currently she is certainly fit enough for surveillance colonoscopy but we can discuss this when due in or around July 2023  30 minutes total spent today including patient facing time, coordination of care, reviewing medical history/procedures/pertinent radiology studies, and documentation  of the encounter.

## 2020-04-19 NOTE — H&P (View-Only) (Signed)
Subjective:    Patient ID: Renee [name], female    DOB: Sep 01, 1940, 80 y.o.   MRN: [id number]  HPI Haru Anspaugh is a 80 year old female with a history of adenomatous colon polyps, colonic diverticulosis, breast cancer with prior mastectomy and reconstruction, remote DVT, hypertension, hyperlipidemia and mild COPD who is seen to evaluate pancreatitis and pancreatic cyst by CT.  She is here alone today.  She was last here in July 2020 for her surveillance colonoscopy.  She reports that in September she developed about a week of progressively worsening eventually severe mid abdominal pain associated with nausea.  Worse with eating.  Started somewhat slowly but became constant.  Was treated conservatively but diagnosed with idiopathic pancreatitis as seen by CT scan.  This was performed on 10/20/2019.  This was reviewed today.  It showed mild acute edematous pancreatitis.  There was a 9 mm cyst in the pancreatic body which was felt to possibly communicate with the main pancreatic duct.  The cyst was mildly enlarged from 2019.  The adjacent main duct was upper limit of normal in diameter.  The liver was fatty but there was no evidence of biliary obstruction or stone.  The bowel was unremarkable with exception of diverticulosis.  She reports that the abdominal pain resolved entirely.  She will occasionally have less than a minute of sharp abdominal pain but nothing like before.  She has some intermittent nausea symptom but no heartburn, dysphagia or odynophagia.  Bowel movements have been regular without blood or melena.  She is taking as needed alprazolam, baby aspirin, vitamin D, duloxetine, metoprolol, Prolia injections every 6 months, rosuvastatin and evolocumab.  She does not drink alcohol.  No family history of pancreatitis that she is aware of  Review of Systems As per HPI, otherwise negative  Current Medications, Allergies, Past Medical History, Past Surgical History, Family History and  Social History were reviewed in [employer] record.     Objective:   Physical Exam BP 126/70   Pulse 74   Ht 5\' 4"  (1.626 m)   Wt 166 lb 12.8 oz (75.7 kg)   SpO2 98%   BMI 28.63 kg/m  Gen: awake, alert, NAD HEENT: anicteric, op clear CV: RRR, no mrg Pulm: CTA b/l Abd: soft, NT/ND, +BS throughout Ext: no c/c/e Neuro: nonfocal   CT ABDOMEN AND PELVIS WITH CONTRAST   TECHNIQUE: Multidetector CT imaging of the abdomen and pelvis was performed using the standard protocol following bolus administration of intravenous contrast.   CONTRAST:  132mL OMNIPAQUE IOHEXOL 300 MG/ML  SOLN   COMPARISON:  06/16/2019   FINDINGS: Lower chest:  No contributory findings.   Hepatobiliary: Hepatic steatosis.No evidence of biliary obstruction or stone.   Pancreas: Peripancreatic edema with mild generalized pancreatic expansion involving the body and tail. There is a cystic density at the level of the body measuring 9 mm, in retrospect stable from comparison. On sagittal reformats this appears contiguous with a mildly prominent but stable pancreatic duct (3 mm in diameter). No discrete mass lesion is seen.   Spleen: Unremarkable.   Adrenals/Urinary Tract: Negative adrenals. No hydronephrosis or stone. 9 mm lesion exophytic from the interpolar left kidney is stable and consistent with hemorrhagic cyst by remote MRI. Unremarkable bladder.   Stomach/Bowel: No obstruction. No visible bowel inflammation. Extensive distal colonic diverticulosis   Vascular/Lymphatic: No acute vascular abnormality. No mass or adenopathy.   Reproductive:No pathologic findings.   Other: No ascites or pneumoperitoneum.   Musculoskeletal: No acute  abnormalities. Postoperative abdominal wall, possibly for breast reconstruction. Generalized disc and facet degeneration in the visualized spine.   IMPRESSION: 1. Mild acute edematous pancreatitis which can be confirmed with labs. 2. 9  mm cyst in the pancreatic body which may communicate with the main duct. The cyst has mildly enlarged from 2019 in the adjacent main pancreatic duct is upper normal diameter. If appropriate for comorbidities, consider follow-up after resolution of pancreatitis.     Electronically Signed   By: Monte Fantasia M.D.   On: 10/20/2019 09:43   Remote abd [place] complete 2010 -- no gallstones     Assessment & Plan:  80 year old female with a history of adenomatous colon polyps, colonic diverticulosis, breast cancer with prior mastectomy and reconstruction, remote DVT, hypertension, hyperlipidemia and mild COPD who is seen to evaluate pancreatitis and pancreatic cyst by CT.   1.  Idiopathic pancreatitis --pancreatitis by imaging and symptoms in September 2021.  Clinically this has resolved.  No definitive medications with strong pancreatitis association.  She does not drink alcohol.  She is not known to have gallstones.  We discussed pancreatitis at length today including the possible etiologies.  I would like to perform lab testing today as below and also repeat cross-sectional imaging. --CBC, CMP, lipase and her glycerides --MRI abdomen with and without contrast plus MRCP; pancreas protocol --She is asked to call me immediately if she has recurrent severe upper abdominal pain similar to the attack from September 2021  2.  9 mm pancreatic body cyst possibly communicating with the main pancreatic duct --this raises the question of IPMN.  Warrants repeat dedicated imaging and we are proceeding with MRI plus MRCP as above.  We reviewed pancreatic anatomy together today.  3.  History of multiple adenomatous colon polyps --currently she is certainly fit enough for surveillance colonoscopy but we can discuss this when due in or around July 2023  30 minutes total spent today including patient facing time, coordination of care, reviewing medical history/procedures/pertinent radiology studies, and documentation  of the encounter.

## 2020-04-20 ENCOUNTER — Inpatient Hospital Stay (HOSPITAL_COMMUNITY): Payer: Medicare Other

## 2020-04-21 DIAGNOSIS — H2511 Age-related nuclear cataract, right eye: Secondary | ICD-10-CM | POA: Diagnosis not present

## 2020-04-22 DIAGNOSIS — H2512 Age-related nuclear cataract, left eye: Secondary | ICD-10-CM | POA: Diagnosis not present

## 2020-04-26 ENCOUNTER — Telehealth: Payer: Self-pay | Admitting: Cardiology

## 2020-04-26 NOTE — Telephone Encounter (Signed)
OK with me Chris  

## 2020-04-26 NOTE — Telephone Encounter (Signed)
Pt called in and stated she is a pt of Dr [name], she would like to transfer to Dr Angelena Form   Best number [phone]

## 2020-04-27 ENCOUNTER — Ambulatory Visit (HOSPITAL_COMMUNITY): Payer: Medicare Other | Admitting: Hematology

## 2020-04-27 NOTE — Telephone Encounter (Signed)
Called pt and let her know she will be following with Dr. Angelena Form going forward.  Scheduled her annual cardiology visit for 09/01/20.

## 2020-04-29 ENCOUNTER — Ambulatory Visit (HOSPITAL_COMMUNITY)
Admission: RE | Admit: 2020-04-29 | Discharge: 2020-04-29 | Disposition: A | Discharge status: Home / Self Care | Payer: Medicare Other | Source: Ambulatory Visit | Attending: Internal Medicine | Admitting: Internal Medicine

## 2020-04-29 ENCOUNTER — Other Ambulatory Visit: Payer: Self-pay

## 2020-04-29 ENCOUNTER — Other Ambulatory Visit: Payer: Self-pay | Admitting: Internal Medicine

## 2020-04-29 DIAGNOSIS — K862 Cyst of pancreas: Secondary | ICD-10-CM | POA: Diagnosis not present

## 2020-04-29 DIAGNOSIS — Z8719 Personal history of other diseases of the digestive system: Secondary | ICD-10-CM

## 2020-04-29 DIAGNOSIS — K8689 Other specified diseases of pancreas: Secondary | ICD-10-CM | POA: Diagnosis not present

## 2020-04-29 DIAGNOSIS — R9389 Abnormal findings on diagnostic imaging of other specified body structures: Secondary | ICD-10-CM | POA: Diagnosis not present

## 2020-04-29 DIAGNOSIS — R932 Abnormal findings on diagnostic imaging of liver and biliary tract: Secondary | ICD-10-CM | POA: Diagnosis not present

## 2020-04-29 MED ORDER — GADOBUTROL 1 MMOL/ML IV SOLN
7.0000 mL | Freq: Once | INTRAVENOUS | Status: AC | PRN
  Administered 2020-04-29: 7 mL via INTRAVENOUS

## 2020-04-30 ENCOUNTER — Other Ambulatory Visit: Payer: Self-pay

## 2020-04-30 DIAGNOSIS — Z8719 Personal history of other diseases of the digestive system: Secondary | ICD-10-CM

## 2020-04-30 DIAGNOSIS — K862 Cyst of pancreas: Secondary | ICD-10-CM

## 2020-05-05 DIAGNOSIS — Z83511 Family history of glaucoma: Secondary | ICD-10-CM | POA: Diagnosis not present

## 2020-05-05 DIAGNOSIS — H02834 Dermatochalasis of left upper eyelid: Secondary | ICD-10-CM | POA: Diagnosis not present

## 2020-05-05 DIAGNOSIS — I10 Essential (primary) hypertension: Secondary | ICD-10-CM | POA: Diagnosis not present

## 2020-05-05 DIAGNOSIS — H18413 Arcus senilis, bilateral: Secondary | ICD-10-CM | POA: Diagnosis not present

## 2020-05-05 DIAGNOSIS — I471 Supraventricular tachycardia: Secondary | ICD-10-CM | POA: Diagnosis not present

## 2020-05-05 DIAGNOSIS — Z821 Family history of blindness and visual loss: Secondary | ICD-10-CM | POA: Diagnosis not present

## 2020-05-05 DIAGNOSIS — E785 Hyperlipidemia, unspecified: Secondary | ICD-10-CM | POA: Diagnosis not present

## 2020-05-05 DIAGNOSIS — Z79899 Other long term (current) drug therapy: Secondary | ICD-10-CM | POA: Diagnosis not present

## 2020-05-05 DIAGNOSIS — J449 Chronic obstructive pulmonary disease, unspecified: Secondary | ICD-10-CM | POA: Diagnosis not present

## 2020-05-05 DIAGNOSIS — H02831 Dermatochalasis of right upper eyelid: Secondary | ICD-10-CM | POA: Diagnosis not present

## 2020-05-05 DIAGNOSIS — Z87891 Personal history of nicotine dependence: Secondary | ICD-10-CM | POA: Diagnosis not present

## 2020-05-05 DIAGNOSIS — Z886 Allergy status to analgesic agent status: Secondary | ICD-10-CM | POA: Diagnosis not present

## 2020-05-05 DIAGNOSIS — Z7982 Long term (current) use of aspirin: Secondary | ICD-10-CM | POA: Diagnosis not present

## 2020-05-05 DIAGNOSIS — Z9841 Cataract extraction status, right eye: Secondary | ICD-10-CM | POA: Diagnosis not present

## 2020-05-05 DIAGNOSIS — H2512 Age-related nuclear cataract, left eye: Secondary | ICD-10-CM | POA: Diagnosis not present

## 2020-05-05 DIAGNOSIS — Z961 Presence of intraocular lens: Secondary | ICD-10-CM | POA: Diagnosis not present

## 2020-05-10 ENCOUNTER — Other Ambulatory Visit (HOSPITAL_COMMUNITY)
Admission: RE | Admit: 2020-05-10 | Discharge: 2020-05-10 | Disposition: A | Discharge status: Home / Self Care | Payer: Medicare Other | Source: Ambulatory Visit | Attending: Gastroenterology | Admitting: Gastroenterology

## 2020-05-10 DIAGNOSIS — Z20822 Contact with and (suspected) exposure to covid-19: Secondary | ICD-10-CM | POA: Insufficient documentation

## 2020-05-10 DIAGNOSIS — Z01812 Encounter for preprocedural laboratory examination: Secondary | ICD-10-CM | POA: Diagnosis not present

## 2020-05-10 LAB — SARS CORONAVIRUS 2 (TAT 6-24 HRS): SARS Coronavirus 2: NEGATIVE

## 2020-05-12 ENCOUNTER — Encounter (HOSPITAL_COMMUNITY): Payer: Self-pay | Admitting: Gastroenterology

## 2020-05-12 DIAGNOSIS — H524 Presbyopia: Secondary | ICD-10-CM | POA: Diagnosis not present

## 2020-05-12 DIAGNOSIS — E7800 Pure hypercholesterolemia, unspecified: Secondary | ICD-10-CM | POA: Diagnosis not present

## 2020-05-12 DIAGNOSIS — M160 Bilateral primary osteoarthritis of hip: Secondary | ICD-10-CM | POA: Diagnosis not present

## 2020-05-12 NOTE — Progress Notes (Signed)
Patient denies shortness of breath, fever, cough or chest pain.  PCP - Dr Deland Pretty Cardiologist - Dr Ena Dawley but changing to Dr Angelena Form going forward. Gastro - Dr Ulice Dash Pyrtle  Chest x-ray - n/a EKG - 08/20/19 Stress Test - 07/16/15 ECHO - 12/03/12 Cardiac Cath - n/a  STOP now taking any Aspirin (unless otherwise instructed by your surgeon), Aleve, Naproxen, Ibuprofen, Motrin, Advil, Goody's, BC's, all herbal medications, fish oil, and all vitamins.   Coronavirus Screening Covid test on 05/10/20 was negative.  Patient verbalized understanding of instructions that were given via phone.

## 2020-05-13 ENCOUNTER — Encounter (HOSPITAL_COMMUNITY): Payer: Self-pay | Admitting: Gastroenterology

## 2020-05-13 ENCOUNTER — Ambulatory Visit (HOSPITAL_COMMUNITY): Payer: Medicare Other | Admitting: Certified Registered Nurse Anesthetist

## 2020-05-13 ENCOUNTER — Ambulatory Visit (HOSPITAL_COMMUNITY)
Admission: RE | Admit: 2020-05-13 | Discharge: 2020-05-13 | Disposition: A | Discharge status: Home / Self Care | Payer: Medicare Other | Attending: Gastroenterology | Admitting: Gastroenterology

## 2020-05-13 ENCOUNTER — Encounter (HOSPITAL_COMMUNITY)
Admission: RE | Disposition: A | Discharge status: Home / Self Care | Payer: Self-pay | Source: Home / Self Care | Attending: Gastroenterology

## 2020-05-13 ENCOUNTER — Other Ambulatory Visit: Payer: Self-pay

## 2020-05-13 DIAGNOSIS — K2951 Unspecified chronic gastritis with bleeding: Secondary | ICD-10-CM | POA: Diagnosis not present

## 2020-05-13 DIAGNOSIS — K922 Gastrointestinal hemorrhage, unspecified: Secondary | ICD-10-CM | POA: Diagnosis not present

## 2020-05-13 DIAGNOSIS — Z86010 Personal history of colonic polyps: Secondary | ICD-10-CM | POA: Insufficient documentation

## 2020-05-13 DIAGNOSIS — K209 Esophagitis, unspecified without bleeding: Secondary | ICD-10-CM | POA: Insufficient documentation

## 2020-05-13 DIAGNOSIS — E785 Hyperlipidemia, unspecified: Secondary | ICD-10-CM | POA: Insufficient documentation

## 2020-05-13 DIAGNOSIS — K859 Acute pancreatitis without necrosis or infection, unspecified: Secondary | ICD-10-CM

## 2020-05-13 DIAGNOSIS — K760 Fatty (change of) liver, not elsewhere classified: Secondary | ICD-10-CM | POA: Insufficient documentation

## 2020-05-13 DIAGNOSIS — K449 Diaphragmatic hernia without obstruction or gangrene: Secondary | ICD-10-CM | POA: Insufficient documentation

## 2020-05-13 DIAGNOSIS — Z853 Personal history of malignant neoplasm of breast: Secondary | ICD-10-CM | POA: Diagnosis not present

## 2020-05-13 DIAGNOSIS — Z9010 Acquired absence of unspecified breast and nipple: Secondary | ICD-10-CM | POA: Diagnosis not present

## 2020-05-13 DIAGNOSIS — J449 Chronic obstructive pulmonary disease, unspecified: Secondary | ICD-10-CM | POA: Insufficient documentation

## 2020-05-13 DIAGNOSIS — K2100 Gastro-esophageal reflux disease with esophagitis, without bleeding: Secondary | ICD-10-CM | POA: Diagnosis not present

## 2020-05-13 DIAGNOSIS — K8689 Other specified diseases of pancreas: Secondary | ICD-10-CM | POA: Diagnosis not present

## 2020-05-13 DIAGNOSIS — Z8719 Personal history of other diseases of the digestive system: Secondary | ICD-10-CM | POA: Diagnosis not present

## 2020-05-13 DIAGNOSIS — I10 Essential (primary) hypertension: Secondary | ICD-10-CM | POA: Diagnosis not present

## 2020-05-13 DIAGNOSIS — K2950 Unspecified chronic gastritis without bleeding: Secondary | ICD-10-CM | POA: Diagnosis not present

## 2020-05-13 DIAGNOSIS — K2970 Gastritis, unspecified, without bleeding: Secondary | ICD-10-CM | POA: Diagnosis not present

## 2020-05-13 DIAGNOSIS — R978 Other abnormal tumor markers: Secondary | ICD-10-CM | POA: Diagnosis not present

## 2020-05-13 DIAGNOSIS — K862 Cyst of pancreas: Secondary | ICD-10-CM | POA: Insufficient documentation

## 2020-05-13 HISTORY — PX: EUS: SHX5427

## 2020-05-13 HISTORY — PX: ESOPHAGOGASTRODUODENOSCOPY (EGD) WITH PROPOFOL: SHX5813

## 2020-05-13 HISTORY — DX: Anemia, unspecified: D64.9

## 2020-05-13 HISTORY — PX: BIOPSY: SHX5522

## 2020-05-13 HISTORY — DX: Dyspnea, unspecified: R06.00

## 2020-05-13 LAB — GLUCOSE, CAPILLARY: Glucose-Capillary: 169 mg/dL — ABNORMAL HIGH (ref 70–99)

## 2020-05-13 SURGERY — UPPER ENDOSCOPIC ULTRASOUND (EUS) RADIAL
Anesthesia: General

## 2020-05-13 MED ORDER — SUGAMMADEX SODIUM 200 MG/2ML IV SOLN
INTRAVENOUS | Status: DC | PRN
  Administered 2020-05-13: 200 mg via INTRAVENOUS

## 2020-05-13 MED ORDER — DEXAMETHASONE SODIUM PHOSPHATE 10 MG/ML IJ SOLN
INTRAMUSCULAR | Status: DC | PRN
  Administered 2020-05-13: 5 mg via INTRAVENOUS

## 2020-05-13 MED ORDER — ESOMEPRAZOLE MAGNESIUM 40 MG PO CPDR: 40.0000 mg | DELAYED_RELEASE_CAPSULE | Freq: Every day | ORAL | 6 refills | Status: DC

## 2020-05-13 MED ORDER — LIDOCAINE 2% (20 MG/ML) 5 ML SYRINGE
INTRAMUSCULAR | Status: DC | PRN
  Administered 2020-05-13: 60 mg via INTRAVENOUS

## 2020-05-13 MED ORDER — LACTATED RINGERS IV SOLN
INTRAVENOUS | Status: AC | PRN
  Administered 2020-05-13: 1000 mL via INTRAVENOUS

## 2020-05-13 MED ORDER — CIPROFLOXACIN IN D5W 400 MG/200ML IV SOLN
INTRAVENOUS | Status: AC
  Filled 2020-05-13: qty 200

## 2020-05-13 MED ORDER — PROPOFOL 10 MG/ML IV BOLUS
INTRAVENOUS | Status: DC | PRN
  Administered 2020-05-13: 150 ug/kg/min via INTRAVENOUS
  Administered 2020-05-13 (×2): 140 mg via INTRAVENOUS

## 2020-05-13 MED ORDER — ONDANSETRON HCL 4 MG/2ML IJ SOLN
INTRAMUSCULAR | Status: DC | PRN
  Administered 2020-05-13: 4 mg via INTRAVENOUS

## 2020-05-13 MED ORDER — SODIUM CHLORIDE 0.9 % IV SOLN: INTRAVENOUS | Status: DC

## 2020-05-13 MED ORDER — CIPROFLOXACIN HCL 500 MG PO TABS: 500.0000 mg | ORAL_TABLET | Freq: Two times a day (BID) | ORAL | 0 refills | Status: AC

## 2020-05-13 MED ORDER — CIPROFLOXACIN IN D5W 400 MG/200ML IV SOLN
INTRAVENOUS | Status: DC | PRN
  Administered 2020-05-13: 400 mg via INTRAVENOUS

## 2020-05-13 MED ORDER — LABETALOL HCL 5 MG/ML IV SOLN
INTRAVENOUS | Status: DC | PRN
  Administered 2020-05-13: 5 mg via INTRAVENOUS

## 2020-05-13 MED ORDER — ROCURONIUM BROMIDE 10 MG/ML (PF) SYRINGE
PREFILLED_SYRINGE | INTRAVENOUS | Status: DC | PRN
  Administered 2020-05-13: 60 mg via INTRAVENOUS

## 2020-05-13 MED ORDER — FENTANYL CITRATE (PF) 250 MCG/5ML IJ SOLN
INTRAMUSCULAR | Status: DC | PRN
  Administered 2020-05-13: 50 ug via INTRAVENOUS

## 2020-05-13 NOTE — Transfer of Care (Signed)
Immediate Anesthesia Transfer of Care Note  Patient: Renee [name]  Procedure(s) Performed: UPPER ENDOSCOPIC ULTRASOUND (EUS) RADIAL (N/A ) BIOPSY  Patient Location: PACU  Anesthesia Type:General  Level of Consciousness: awake, alert  and oriented  Airway & Oxygen Therapy: Patient Spontanous Breathing and Patient connected to nasal cannula oxygen  Post-op Assessment: Report given to RN and Post -op Vital signs reviewed and stable  Post vital signs: Reviewed and stable  Last Vitals:  Vitals Value Taken Time  BP 139/66 05/13/20 1302  Temp 36.2 C 05/13/20 1300  Pulse 77 05/13/20 1311  Resp 15 05/13/20 1311  SpO2 100 % 05/13/20 1311  Vitals shown include unvalidated device data.  Last Pain:  Vitals:   05/13/20 1300  TempSrc:   PainSc: 0-No pain         Complications: No complications documented.

## 2020-05-13 NOTE — Anesthesia Procedure Notes (Signed)
Procedure Name: Intubation Date/Time: 05/13/2020 11:45 AM Performed by: Valda Favia, CRNA Pre-anesthesia Checklist: Patient identified, Emergency Drugs available, Suction available and Patient being monitored Patient Re-evaluated:Patient Re-evaluated prior to induction Oxygen Delivery Method: Circle System Utilized Preoxygenation: Pre-oxygenation with 100% oxygen Induction Type: IV induction Ventilation: Mask ventilation without difficulty Laryngoscope Size: Mac and 4 Grade View: Grade III Tube type: Oral Tube size: 7.0 mm Number of attempts: 2 Airway Equipment and Method: Stylet and Oral airway Placement Confirmation: ETT inserted through vocal cords under direct vision,  positive ETCO2 and breath sounds checked- equal and bilateral Secured at: 21 cm Tube secured with: Tape Dental Injury: Teeth and Oropharynx as per pre-operative assessment  Comments: DLx 1 A. Chaselyn Nanney, CRNA Grade III view due to limited oral opening ETT placed in esophagus, DL x 2 A. Nature Kueker, CRNA Grade III view successful placement of ETT VSS +EtCO2

## 2020-05-13 NOTE — Op Note (Signed)
[facility] Patient Name: Renee [name] Procedure Date : 05/13/2020 MRN: [id number] Attending MD: Justice Britain , MD Date of Birth: 26-Jan-1941 CSN: [id number] Age: 80 Admit Type: Outpatient Procedure:                Upper EUS Indications:              Pancreatic cyst on MRCP, Dilated pancreatic duct on                            MRCP, Acute pancreatitis (at least 1 episode of                            unclear etiology), For cyst aspiration attempt Providers:                Justice Britain, MD, Kary Kos RN, RN, Elspeth Cho Tech., Technician, Edmonia James, CRNA Referring MD:             Lajuan Lines. Hilarie Fredrickson, MD, Deland Pretty Medicines:                General Anesthesia, Cipro 062 mg IV Complications:            No immediate complications. Estimated Blood Loss:     Estimated blood loss was minimal. Procedure:                Pre-Anesthesia Assessment:                           - Prior to the procedure, a History and Physical                            was performed, and patient medications and                            allergies were reviewed. The patient's tolerance of                            previous anesthesia was also reviewed. The risks                            and benefits of the procedure and the sedation                            options and risks were discussed with the patient.                            All questions were answered, and informed consent                            was obtained. Prior Anticoagulants: The patient has                            taken no previous anticoagulant or antiplatelet  agents except for NSAID medication. ASA Grade                            Assessment: III - A patient with severe systemic                            disease. After reviewing the risks and benefits,                            the patient was deemed in satisfactory condition to                             undergo the procedure.                           After obtaining informed consent, the endoscope was                            passed under direct vision. Throughout the                            procedure, the patient's blood pressure, pulse, and                            oxygen saturations were monitored continuously. The                            [id number]-H190 ([id number]) Olympus gastroscope was                            introduced through the mouth, and advanced to the                            second part of duodenum. The [id number]-Q180V ([id number])                            Olympus Duodenoscope was introduced through the                            mouth, and advanced to the second part of duodenum.                            The GF-UCT180 ([id number]) Olympus Linear EUS scope                            was introduced through the mouth, and advanced to                            the duodenum for ultrasound examination from the                            stomach and duodenum. The upper EUS was  accomplished without difficulty. The patient                            tolerated the procedure. Scope In: Scope Out: Findings:      ENDOSCOPIC FINDING: :      No gross lesions were noted in the entire esophagus.      LA Grade A (one or more mucosal breaks less than 5 mm, not extending       between tops of 2 mucosal folds) esophagitis with no bleeding was found       at the gastroesophageal junction.      A 2 cm hiatal hernia was present.      Hematin (altered blood/coffee-ground-like material) was found in the       cardia and in the gastric body. Lavage of the area was performed using       copious amounts, resulting in clearance with adequate visualization.      Patchy moderate inflammation characterized by erosions, erythema and       friability was found in the gastric body and in the gastric antrum.      No other gross lesions were noted in the entire examined  stomach.       Biopsies were taken with a cold forceps for histology and Helicobacter       pylori testing.      No gross lesions were noted in the duodenal bulb, in the first portion       of the duodenum and in the second portion of the duodenum.      The ampulla was normal.      ENDOSONOGRAPHIC FINDING: :      An anechoic lesion suggestive of a cyst was identified in the pancreatic       tail. It communicates with the pancreatic duct and causes upstream       dilation from 1.0 mm -> 4.0 mm. The lesion measured 19.1 mm by 12.5 mm       in maximal cross-sectional diameter as evidence . There was a single       compartment without septae. The outer wall of the lesion was thin. There       was no associated mass. There was no internal debris within the       fluid-filled cavity. The splenic artery is directly around this cyst and       only a very small window is present in the region for attempt at needle       aspiration. Color Doppler imaging was utilized prior to needle puncture       to confirm a lack of significant vascular structures within the needle       path. Three passes were made with the 22 gauge needle using a       transgastric approach. Some passes were made with a stylet. The amount       of fluid collected was only a total of 1 mL. The fluid was cloudy,       turbid, serosanguinous and viscous. Sample was sent for amylase       concentration, cytology, CEA, (GNAS mutation analysis and KRAS mutation       analysis if CEA>290).      There was no sign of significant endosonographic abnormality in the       pancreatic head (2.6 mm), genu of the pancreas (1.7 mm) and pancreatic  body (1.0 mm). The pancreatic duct was regular in contour.      There was no sign of significant endosonographic abnormality in the       common bile duct (6.4 mm) and in the common hepatic duct. No biliary       sludge and ducts with regular contour were identified.      Endosonographic imaging  of the ampulla showed no extrinsic compression,       intramural (subepithelial) lesion, mass, varices or wall thickening.      Endosonographic imaging in the visualized portion of the liver showed no       mass.      No malignant-appearing lymph nodes were visualized in the celiac region       (level 20), peripancreatic region and porta hepatis region.      The celiac region was visualized. Impression:               EGD Impression:                           - No gross lesions in esophagus. LA Grade A                            esophagitis with no bleeding at the [employer].                           - 2 cm hiatal hernia.                           - Hematin (altered blood/coffee-ground-like                            material) in the cardia and in the gastric body.                           - Gastritis in antrum/body. No other gross lesions                            in the stomach. Biopsied.                           - No gross lesions in the duodenal bulb, in the                            first portion of the duodenum and in the second                            portion of the duodenum.                           - Normal ampulla.                           EUS Impression:                           - A cystic lesion was seen in the pancreatic tail.  Tissue was obtained from this exam, and results are                            pending. 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 Fine needle aspiration                            for fluid performed in attempt of getting                            CEA/Amylase. However, there was only a very small                            window as a result of the splenic artery that                            courses right along this region of the pancreas.                           - There was no  sign of significant pathology in the                            pancreatic head, genu of the pancreas and                            pancreatic body.                           - There was no sign of significant pathology in the                            common bile duct and in the common hepatic duct.                           - No malignant-appearing lymph nodes were                            visualized in the celiac region (level 20),                            peripancreatic region and porta hepatis region. Recommendation:           - The patient will be observed post-procedure,                            until all discharge criteria are met.                           - Discharge patient to  home.                           - Patient has a contact number available for                            emergencies. The signs and symptoms of potential                            delayed complications were discussed with the                            patient. Return to normal activities tomorrow.                            Written discharge instructions were provided to the                            patient.                           - Low fat diet for 1 week.                           - Observe patient's clinical course.                           - Await cytology results, await path results and                            await tumor markers.                           - Start Nexium 40 mg daily.                           - Ciprofloxacin 500 mg twice daily for 3 days.                           - Will likely discuss case at [facility] in coming months                            for next steps in evaluation and consideration of                            surgical referral for pancreatic tail resection if                            etiology of pancreatitis is this cyst otherwise,                            consideration of monitoring this region since it is                            not >1.0 cm may be  considered,  but this will again                            be considered in the future based on pathology and                            review at [facility].                           - The findings and recommendations were discussed                            with the patient.                           - The findings and recommendations were discussed                            with the patient's family. Procedure Code(s):        --- Professional ---                           [phone], Esophagogastroduodenoscopy, flexible,                            transoral; with transendoscopic ultrasound-guided                            intramural or transmural fine needle                            aspiration/biopsy(s), (includes endoscopic                            ultrasound examination limited to the esophagus,                            stomach or duodenum, and adjacent structures) Diagnosis Code(s):        --- Professional ---                           K20.90, Esophagitis, unspecified without bleeding                           K44.9, Diaphragmatic hernia without obstruction or                            gangrene                           K92.2, Gastrointestinal hemorrhage, unspecified                           K29.70, Gastritis, unspecified, without bleeding                           K86.2, Cyst of pancreas  I89.9, Noninfective disorder of lymphatic vessels                            and lymph nodes, unspecified                           K86.89, Other specified diseases of pancreas                           K85.90, Acute pancreatitis without necrosis or                            infection, unspecified CPT copyright 2019 American Medical Association. All rights reserved. The codes documented in this report are preliminary and upon coder review may  be revised to meet current compliance requirements. Justice Britain, MD 05/13/2020 1:12:00 PM Number of Addenda: 0

## 2020-05-13 NOTE — Anesthesia Preprocedure Evaluation (Addendum)
Anesthesia Evaluation  Patient identified by MRN, date of birth, ID band Patient awake    Reviewed: Allergy & Precautions, NPO status , Patient's Chart, lab work & pertinent test results, reviewed documented beta blocker date and time   History of Anesthesia Complications (+) PONV and history of anesthetic complications  Airway Mallampati: III  TM Distance: <3 FB Neck ROM: Full    Dental  (+) Dental Advisory Given, Teeth Intact   Pulmonary COPD, former smoker,    breath sounds clear to auscultation       Cardiovascular hypertension, Pt. on home beta blockers and Pt. on medications  Rhythm:Regular  2017 stress:   Nuclear stress EF: 83%.  Blood pressure demonstrated a hypertensive response to exercise.  Horizontal ST segment depression ST segment depression was noted during stress in the II, III, aVF, V4, V5 and V6 leads.  This is a low risk study.  The left ventricular ejection fraction is hyperdynamic (>65%).   Poor exercise tolerance achieving less than 5 METS with HTN respnse Positive ECG Perfusion images with breast attenuation no ischemia or infarct EF 83%   Left ventricle: The cavity size was normal. Wall thickness  was increased in a pattern of moderate LVH. Systolic  function was normal. The estimated ejection fraction was  in the range of 60% to 65%.  - Atrial septum: No defect or patent foramen ovale was  identified.  - Pericardium, extracardiac: A trivial pericardial effusion  was identified.     Neuro/Psych PSYCHIATRIC DISORDERS Anxiety  Neuromuscular disease    GI/Hepatic GERD  ,Lab Results      Component                Value               Date                      ALT                      8                   04/19/2020                AST                      17                  04/19/2020                ALKPHOS                  55                  04/19/2020                BILITOT                   0.8                 04/19/2020            pancreatic cyst   Endo/Other  diabetes  Renal/GU      Musculoskeletal  (+) Arthritis , Fibromyalgia -  Abdominal   Peds  Hematology Lab Results      Component                Value  Date                      WBC                      8.4                 04/19/2020                HGB                      14.4                04/19/2020                HCT                      41.9                04/19/2020                MCV                      97.7                04/19/2020                PLT                      241.0               04/19/2020              Anesthesia Other Findings   Reproductive/Obstetrics                            Anesthesia Physical Anesthesia Plan  ASA: II  Anesthesia Plan: General   Post-op Pain Management:    Induction: Intravenous  PONV Risk Score and Plan: 4 or greater and Ondansetron, Dexamethasone, TIVA and Propofol infusion  Airway Management Planned: Oral ETT  Additional Equipment: None  Intra-op Plan:   Post-operative Plan: Extubation in OR  Informed Consent: I have reviewed the patients History and Physical, chart, labs and discussed the procedure including the risks, benefits and alternatives for the proposed anesthesia with the patient or authorized representative who has indicated his/her understanding and acceptance.     Dental advisory given  Plan Discussed with: CRNA and Surgeon  Anesthesia Plan Comments:         Anesthesia Quick Evaluation

## 2020-05-13 NOTE — Interval H&P Note (Signed)
History and Physical Interval Note:  05/13/2020 9:51 AM  Renee [name]  has presented today for surgery, with the diagnosis of pancreatic cyst.  The various methods of treatment have been discussed with the patient and family. After consideration of risks, benefits and other options for treatment, the patient has consented to  Procedure(s): UPPER ENDOSCOPIC ULTRASOUND (EUS) RADIAL (N/A) as a surgical intervention.  The patient's history has been reviewed, patient examined, no change in status, stable for surgery.  I have reviewed the patient's chart and labs.  Questions were answered to the patient's satisfaction.    The risks of an EUS including intestinal perforation, bleeding, infection, aspiration, and medication effects were discussed as was the possibility it may not give a definitive diagnosis if a biopsy is performed.  When a biopsy of the pancreas is done as part of the EUS, there is an additional risk of pancreatitis at the rate of about 1-2%.  It was explained that procedure related pancreatitis is typically mild, although it can be severe and even life threatening, which is why we do not perform random pancreatic biopsies and only biopsy a lesion/area we feel is concerning enough to warrant the risk.  The risks and benefits of endoscopic evaluation were discussed with the patient; these include but are not limited to the risk of perforation, infection, bleeding, missed lesions, lack of diagnosis, severe illness requiring hospitalization, as well as anesthesia and sedation related illnesses.  The patient is agreeable to proceed.    [employer]

## 2020-05-14 ENCOUNTER — Encounter (HOSPITAL_COMMUNITY): Payer: Self-pay | Admitting: Gastroenterology

## 2020-05-14 LAB — SURGICAL PATHOLOGY

## 2020-05-14 LAB — CYTOLOGY - NON PAP

## 2020-05-14 NOTE — Anesthesia Postprocedure Evaluation (Signed)
Anesthesia Post Note  Patient: Renee [name]  Procedure(s) Performed: UPPER ENDOSCOPIC ULTRASOUND (EUS) RADIAL (N/A ) BIOPSY     Patient location during evaluation: PACU Anesthesia Type: General Level of consciousness: awake and alert Pain management: pain level controlled Vital Signs Assessment: post-procedure vital signs reviewed and stable Respiratory status: spontaneous breathing, nonlabored ventilation, respiratory function stable and patient connected to nasal cannula oxygen Cardiovascular status: blood pressure returned to baseline and stable Postop Assessment: no apparent nausea or vomiting Anesthetic complications: no   No complications documented.  Last Vitals:  Vitals:   05/13/20 1330 05/13/20 1345  BP: 132/64 124/79  Pulse: 74 75  Resp: 14 16  Temp:  (!) 36.4 C  SpO2: 96% 97%    Last Pain:  Vitals:   05/13/20 1345  TempSrc:   PainSc: 0-No pain                 Talis Iwan

## 2020-05-17 ENCOUNTER — Encounter: Payer: Self-pay | Admitting: Gastroenterology

## 2020-05-21 ENCOUNTER — Encounter: Payer: Self-pay | Admitting: Gastroenterology

## 2020-05-21 NOTE — Progress Notes (Signed)
Renee [name] I spoke with the pt and she has been advised that Dr Rush Landmark will contact her in a few weeks.

## 2020-05-21 NOTE — Progress Notes (Addendum)
For documentation purposes  [facility] fluid cell analysis CEA 54 ng/mL Amylase 28,078 units/L  Based on the patient's characteristics this would be more consistent with a potential pseudocyst but pseudocyst do not often cause upstream dilation of the pancreas.  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  The Cytopathology from FNA was negative for malignant cells and had some acellular debris.  I will present the patient's case at an upcoming [place] in the coming weeks.  This will then help [place] decide next steps in evaluation and follow-up and potential interventions.  Patty or Wynona, can you please reach out to the patient and let her know that we will discuss her case at an upcoming [place] and get in touch with her in the next few weeks (most likely next 2 to 3 weeks.).  FYI Dr. Hilarie Fredrickson.   Justice Britain, MD [facility] Office # [phone]

## 2020-05-25 DIAGNOSIS — M810 Age-related osteoporosis without current pathological fracture: Secondary | ICD-10-CM | POA: Diagnosis not present

## 2020-05-25 DIAGNOSIS — Z7982 Long term (current) use of aspirin: Secondary | ICD-10-CM | POA: Diagnosis not present

## 2020-05-25 DIAGNOSIS — E118 Type 2 diabetes mellitus with unspecified complications: Secondary | ICD-10-CM | POA: Diagnosis not present

## 2020-05-25 DIAGNOSIS — E7800 Pure hypercholesterolemia, unspecified: Secondary | ICD-10-CM | POA: Diagnosis not present

## 2020-05-25 DIAGNOSIS — I10 Essential (primary) hypertension: Secondary | ICD-10-CM | POA: Diagnosis not present

## 2020-05-27 DIAGNOSIS — T466X5A Adverse effect of antihyperlipidemic and antiarteriosclerotic drugs, initial encounter: Secondary | ICD-10-CM | POA: Diagnosis not present

## 2020-05-27 DIAGNOSIS — E118 Type 2 diabetes mellitus with unspecified complications: Secondary | ICD-10-CM | POA: Diagnosis not present

## 2020-05-27 DIAGNOSIS — I10 Essential (primary) hypertension: Secondary | ICD-10-CM | POA: Diagnosis not present

## 2020-05-27 DIAGNOSIS — I2584 Coronary atherosclerosis due to calcified coronary lesion: Secondary | ICD-10-CM | POA: Diagnosis not present

## 2020-05-27 DIAGNOSIS — M791 Myalgia, unspecified site: Secondary | ICD-10-CM | POA: Diagnosis not present

## 2020-05-27 DIAGNOSIS — E7800 Pure hypercholesterolemia, unspecified: Secondary | ICD-10-CM | POA: Diagnosis not present

## 2020-05-28 DIAGNOSIS — M25552 Pain in left hip: Secondary | ICD-10-CM | POA: Diagnosis not present

## 2020-06-02 ENCOUNTER — Other Ambulatory Visit: Payer: Self-pay

## 2020-06-02 DIAGNOSIS — M25552 Pain in left hip: Secondary | ICD-10-CM | POA: Diagnosis not present

## 2020-06-02 NOTE — Progress Notes (Signed)
The proposed treatment discussed in conference is for discussion purposes only and is not a binding recommendation.  The patients have not been physically examined, or presented with their treatment options.  Therefore, final treatment plans cannot be decided.   

## 2020-06-04 ENCOUNTER — Telehealth: Payer: Self-pay | Admitting: *Deleted

## 2020-06-04 NOTE — Telephone Encounter (Signed)
   [facility] Pre-operative Risk Assessment    Patient Name: Renee [name]  DOB: 10-Jan-1941  MRN: [id number]   HEARTCARE STAFF: - Please ensure there is not already an duplicate clearance open for this procedure. - Under Visit Info/Reason for Call, type in Other and utilize the format Clearance MM/DD/YY or Clearance TBD. Do not use dashes or single digits. - If request is for dental extraction, please clarify the # of teeth to be extracted.  Request for surgical clearance:  1. What type of surgery is being performed? LEFT TOTAL HIP REPLACEMENT   2. When is this surgery scheduled? TBD   3. What type of clearance is required (medical clearance vs. Pharmacy clearance to hold med vs. Both)? MEDICAL  4. Are there any medications that need to be held prior to surgery and how long? ASA    5. Practice name and name of physician performing surgery? [facility]; DR. Christia Reading MURPHY   6. What is the office phone number? [phone]   7.   What is the office fax number? [place].   Anesthesia type (None, local, MAC, general) ? CHOICE   Julaine Hua 06/04/2020, 3:48 PM  _________________________________________________________________   (provider comments below)

## 2020-06-07 NOTE — Telephone Encounter (Signed)
Spoke with patient, patient states that she has cancelled her upcoming surgery and is seeking a second opinion. Patient will send in another clearance request if needed. Patient states that she does not need another appointment at this time. Advised patient to call back to office with any issues, or concerns. Patient verbalized understanding.

## 2020-06-07 NOTE — Telephone Encounter (Signed)
   Name: Renee [name]  DOB: 06-22-1940  MRN: [id number]  Primary Cardiologist: Lauree Chandler, MD  Chart reviewed as part of pre-operative protocol coverage. Because of Besan Ketchem Wanless's past medical history and time since last visit, she will require a follow-up visit in order to better assess preoperative cardiovascular risk.  Pre-op covering staff: - Please schedule appointment and call patient to inform them. If patient already had an upcoming appointment within acceptable timeframe, please add "pre-op clearance" to the appointment notes so provider is aware. - Please contact requesting surgeon's office via preferred method (i.e, phone, fax) to inform them of need for appointment prior to surgery.  If applicable, this message will also be routed to pharmacy pool and/or primary cardiologist for input on holding anticoagulant/antiplatelet agent as requested below so that this information is available to the clearing provider at time of patient's appointment.   Loel Dubonnet, NP  06/07/2020, 12:17 PM

## 2020-06-07 NOTE — Telephone Encounter (Signed)
   Name:  Renee [name]  DOB:  1940-12-28  MRN:  [id number]   Primary Cardiologist: Lauree Chandler, MD  Chart reviewed as part of pre-operative protocol coverage.   Meyer Dockery Orndoff was contacted and has decided to cancel upcoming surgery and is seeking second opinion. Will route to surgical team as FYI. If she does proceed with surgery, updated clearance will need to be requested.   Will remove from preop box.  Please call with questions.   Loel Dubonnet, NP 06/07/2020, 2:34 PM

## 2020-06-08 NOTE — Telephone Encounter (Signed)
Pt has been scheduled to see Richardson Dopp, [facility] 07/27/20 @ 10:45 for pre op clearance. I will not cancel Dr. Angelena Form appt 08/2020, will leave that up to [facility] at appt 07/27/20 if ok to cancel 08/2020 appt. Will add pt to wait list for possible sooner appt. Pt is thankful for sooner appt and the help. I will send notes to [facility] for 07/27/20 appt as well as FYI to surgeon's office pt has appt.

## 2020-06-08 NOTE — Telephone Encounter (Signed)
Pt is returning call stating that she does want to go forward with the surgery.

## 2020-06-08 NOTE — Telephone Encounter (Signed)
   Name: Renee [name]  DOB: [birth year]  MRN: [id number]  Primary Cardiologist: Lauree Chandler, MD  Chart reviewed as part of pre-operative protocol coverage. Sounds like patient has decided she wants to have surgery. She has not been seen in our office in >6 months. Therefore, she will require a follow-up visit in order to better assess preoperative cardiovascular risk.  Pre-op covering staff: - Please schedule appointment and call patient to inform them. If patient already had an upcoming appointment within acceptable timeframe, please add "pre-op clearance" to the appointment notes so provider is aware. - Please contact requesting surgeon's office via preferred method (i.e, phone, fax) to inform them of need for appointment prior to surgery.  If applicable, this message will also be routed to pharmacy pool and/or primary cardiologist for input on holding anticoagulant/antiplatelet agent as requested below so that this information is available to the clearing provider at time of patient's appointment.   Darreld Mclean, PA-C  06/08/2020, 11:53 AM

## 2020-06-11 ENCOUNTER — Encounter: Payer: Self-pay | Admitting: Gastroenterology

## 2020-06-11 DIAGNOSIS — Z01818 Encounter for other preprocedural examination: Secondary | ICD-10-CM | POA: Diagnosis not present

## 2020-06-11 NOTE — Progress Notes (Signed)
Left message on machine to call back  

## 2020-06-11 NOTE — Progress Notes (Signed)
Case discussed at multidisciplinary conference.  Her imaging was reviewed as was the evaluation of her cystic fluid that was obtained during her endoscopic ultrasound.  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  The cytopathology was negative for any malignant cells which is good at this time but this still does require further follow-up and management.  After discussion with the pancreaticobiliary surgeons, they feel that discussion with the patient and family to decide whether continued monitoring via surveillance MRI versus potential for resection should be considered due to the imaging findings and increasing ductal dilation.  Thus we will move forward with reaching out to the patient and offering her a referral to Dr. Alexis Goodell. Zenia Resides to discuss options that are available for her.  I will forward this information to Dr. Hilarie Fredrickson her primary GI and to her PCP Dr. Shelia Media.   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  When I return next week if the patient/family have further questions I will be happy to try and answer those if they remain.  Please forward to patient's PCP as well this information.  Thanks. GM

## 2020-06-14 NOTE — Progress Notes (Signed)
Left message on machine to call back  

## 2020-06-15 DIAGNOSIS — E7800 Pure hypercholesterolemia, unspecified: Secondary | ICD-10-CM | POA: Diagnosis not present

## 2020-06-15 NOTE — Progress Notes (Signed)
Left message on machine to call back  

## 2020-06-16 NOTE — Progress Notes (Signed)
Referral has been made to [place] records faxed.

## 2020-06-16 NOTE — Progress Notes (Signed)
I spoke with the pt and she tells me that she does not wish to be referred to the surgeons.  She is not interested in seeing a Psychologist, sport and exercise.

## 2020-06-16 NOTE — Progress Notes (Signed)
Staff message to call pt in 3-6 months to set up imaging

## 2020-06-16 NOTE — Progress Notes (Signed)
Renee [name], Thank you for reaching out to her. I did go ahead and give her a call back as well today because I just wanted to ensure she absolutely did not want to meet the surgeons. 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 If she decides after meeting Dr. Zenia Resides or Dr. Barry Dienes to just pursue imaging surveillance of the area then we certainly will respect her wish. We will tentatively plan an MRI/MRCP in 3 to 6 months (placed under Dr. Hilarie Fredrickson or my name). The patient is amenable to a surgical referral to meet Dr. Zenia Resides or Dr. Barry Dienes. This is a nonurgent referral. Thank you. GM

## 2020-06-22 DIAGNOSIS — M810 Age-related osteoporosis without current pathological fracture: Secondary | ICD-10-CM | POA: Diagnosis not present

## 2020-07-05 ENCOUNTER — Encounter: Payer: Self-pay | Admitting: Family

## 2020-07-05 ENCOUNTER — Ambulatory Visit (INDEPENDENT_AMBULATORY_CARE_PROVIDER_SITE_OTHER): Payer: Medicare Other | Admitting: Family

## 2020-07-05 ENCOUNTER — Other Ambulatory Visit: Payer: Self-pay

## 2020-07-05 VITALS — BP 134/80 | HR 86 | Ht 64.0 in | Wt 163.8 lb

## 2020-07-05 DIAGNOSIS — E785 Hyperlipidemia, unspecified: Secondary | ICD-10-CM | POA: Diagnosis not present

## 2020-07-05 DIAGNOSIS — I2584 Coronary atherosclerosis due to calcified coronary lesion: Secondary | ICD-10-CM

## 2020-07-05 DIAGNOSIS — I10 Essential (primary) hypertension: Secondary | ICD-10-CM

## 2020-07-05 DIAGNOSIS — Z01810 Encounter for preprocedural cardiovascular examination: Secondary | ICD-10-CM | POA: Diagnosis not present

## 2020-07-05 DIAGNOSIS — I4510 Unspecified right bundle-branch block: Secondary | ICD-10-CM | POA: Insufficient documentation

## 2020-07-05 DIAGNOSIS — I2510 Atherosclerotic heart disease of native coronary artery without angina pectoris: Secondary | ICD-10-CM | POA: Diagnosis not present

## 2020-07-05 DIAGNOSIS — R42 Dizziness and giddiness: Secondary | ICD-10-CM | POA: Diagnosis not present

## 2020-07-05 MED ORDER — METOPROLOL TARTRATE 50 MG PO TABS: 50.0000 mg | ORAL_TABLET | Freq: Two times a day (BID) | ORAL | 3 refills | Status: DC

## 2020-07-05 NOTE — Patient Instructions (Addendum)
Medication Instructions:  Continue your current medications.   *If you need a refill on your cardiac medications before your next appointment, please call your pharmacy*   Lab Work: None ordered today.   Testing/Procedures: Your EKG today showed normal sinus rhythm with no concerning changes.   Follow-Up: At [facility], you and your health needs are our priority.  As part of our continuing mission to provide you with exceptional heart care, we have created designated Provider Care Teams.  These Care Teams include your primary Cardiologist (physician) and Advanced Practice Providers (APPs -  Physician Assistants and Nurse Practitioners) who all work together to provide you with the care you need, when you need it.  We recommend signing up for the patient portal called "MyChart".  Sign up information is provided on this After Visit Summary.  MyChart is used to connect with patients for Virtual Visits (Telemedicine).  Patients are able to view lab/test results, encounter notes, upcoming appointments, etc.  Non-urgent messages can be sent to your provider as well.   To learn more about what you can do with MyChart, go to [email or web address].    Your next appointment:   As scheduled August 3rd with Dr. Angelena Form  Other Instructions  Loel Dubonnet, NP will send a note to Dr. Percell Miller to let him know you are cleared for surgery.   Heart Healthy Diet Recommendations: A low-salt diet is recommended. Meats should be grilled, baked, or boiled. Avoid fried foods. Focus on lean protein sources like fish or chicken with vegetables and fruits. The American Heart Association is a Microbiologist!  American Heart Association Diet and Lifeystyle Recommendations   Exercise recommendations: The American Heart Association recommends 150 minutes of moderate intensity exercise weekly. Try 30 minutes of moderate intensity exercise 4-5 times per week. This could include walking, jogging, or  swimming.  Tips to Measure your Blood Pressure Correctly  Here's what you can do to ensure a correct reading: . Don't drink a caffeinated beverage or smoke during the 30 minutes before the test. . Sit quietly for five minutes before the test begins. . During the measurement, sit in a chair with your feet on the floor and your arm supported so your elbow is at about heart level. . The inflatable part of the cuff should completely cover at least 80% of your upper arm, and the cuff should be placed on bare skin, not over a shirt. . Don't talk during the measurement. . Have your blood pressure measured twice, with a brief break in between. If the readings are different by 5 points or more, have it done a third time.

## 2020-07-05 NOTE — Progress Notes (Signed)
Office Visit    Patient Name: Renee [name] Date of Encounter: 07/05/2020  PCP:  Renee [name], [place]  Cardiologist:  Renee Chandler, MD  Advanced Practice Provider:  No care team member to display Electrophysiologist:  None    Chief Complaint    Renee [name] is a 80 y.o. female with a hx of  coronary artery calcification on CT, dyspnea on exertion, hypertension, hyperlipidemia, DVT, COPD, carotid artery disease presents today for preoperative cardiovascular clearance  Past Medical History    Past Medical History:  Diagnosis Date  . Anemia    history only at age 38 yrs olds, no current problems  . Aortic atherosclerosis ([place])   . Arthritis    oa  . Blood transfusion 1948  . Blood transfusion without reported diagnosis   . Breast cancer ([place]) 1994   bilateral / HX skin cancer  . Carotid atherosclerosis   . Cataract    both eyes  . COPD (chronic obstructive pulmonary disease) ([place])    no meds/no inaler  . Diabetes ([place])    type 2 - diet controlled, no meds  . Disc disease, degenerative, cervical    trouble turing head and neck at times  . Diverticulosis   . DVT (deep venous thrombosis) ([place]) 2002   left leg  . Dyspnea    occasional with exertion  . Fibromyalgia   . GAD (generalized anxiety disorder)   . History of vertigo   . Hx of skin cancer, basal cell   . Hyperlipidemia   . Hypertension   . Measles as child  . Mumps age 6  . Osteoarthritis   . Osteoporosis   . Pancreatic cyst   . Pancreatitis   . PONV (postoperative nausea and vomiting)    PONV  . Pulmonary nodule   . Right renal mass   . Seborrheic keratosis   . Sinus tachycardia   . Sinus tachycardia   . Tubular adenoma of colon    Past Surgical History:  Procedure Laterality Date  . ABDOMINAL HYSTERECTOMY    . achilles tendon adn 2 bone spurs removed Left 05-2012  . APPENDECTOMY    . [place]an[place]   lower  . BASAL CELL CARCINOMA EXCISION     . bilateral mastectomy with tramflap reconstruction  1994  . BIOPSY  05/13/2020   Procedure: BIOPSY;  Surgeon: Renee Landmark Telford Nab., MD;  Location: [facility];  Service: Gastroenterology;;  . BREAST REDUCTION SURGERY    . CARPAL TUNNEL RELEASE Right 03/2017  . COLONOSCOPY    . ESOPHAGOGASTRODUODENOSCOPY (EGD) WITH PROPOFOL N/A 05/13/2020   Procedure: ESOPHAGOGASTRODUODENOSCOPY (EGD) WITH PROPOFOL;  Surgeon: Renee Landmark Telford Nab., MD;  Location: [place];  Service: Gastroenterology;  Laterality: N/A;  . EUS N/A 05/13/2020   Procedure: UPPER ENDOSCOPIC ULTRASOUND (EUS) RADIAL;  Surgeon: Renee Landmark Telford Nab., MD;  Location: [place];  Service: Gastroenterology;  Laterality: N/A;  . FOOT SURGERY    . KNEE ARTHROSCOPY Right 2008  . KNEE SURGERY  2002   arthroscopy-bilateral  . MASTECTOMY     bilateral  . PARTIAL HYSTERECTOMY    . POLYPECTOMY    . SHOULDER SURGERY Left   . TONSILLECTOMY    . TOTAL KNEE ARTHROPLASTY Left 10/14/2012   Procedure: LEFT TOTAL KNEE ARTHROPLASTY;  Surgeon: Renee Alf, MD;  Location: [facility];  Service: Orthopedics;  Laterality: Left;  . TOTAL KNEE ARTHROPLASTY Right 10/20/2013   Procedure: RIGHT TOTAL KNEE ARTHROPLASTY;  Surgeon: Renee Alf, MD;  Location: [facility];  Service: Orthopedics;  Laterality: Right;    Allergies  Allergies  Allergen Reactions  . Aspirin Nausea Only  . Atorvastatin Other (See Comments)    Pt reports causes "muscle aches."  . Codeine Itching    Mood changes, can take percocet  . Ibuprofen Hives    Blisters   . Lisinopril Cough    History of Present Illness    [name] is a 80 y.o. female with a hx of coronary artery calcification on CT, dyspnea on exertion, hypertension, hyperlipidemia, DVT, COPD, carotid artery disease.  She was last seen 08/20/2019 by Renee Husk, PA.  Previous stress test 07/2015 no ischemia and normal LVEF. She did have hypertensive response to exercise and antihypertensive  regimen was escalated. Carotid duplex June 2017 with 1 to 39% right ICA stenosis and stable 40-59% left ICA stenosis. Repeat duplex 07/2017 bilateral 1-39% stenosis. CT 03/2019 with incidental finding of aortic and coronary atherosclerosis.   Her lipid control has been assisted with the lipid clinic.  Previous intolerance to Vascepa with shortness of breath.  Tolerates Crestor.  She was last seen by July 2021 doing well from cardiac perspective recommended for one year follow up.   She presents today for follow up.  Note she is in significant pain related to degenerative disc disease as well as her hip.  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  She has been referred to a surgeon to discuss serial monitoring with MRI versus resection.  She is understandably overwhelmed by her need for various medical procedures.  She reports no chest pain, pressure, tightness.  She reports occasional dyspnea on exertion when she does more than her usual activity.  Does note that her activity tolerance is limited by her pain but that she is an exercise tolerance of greater than 4 METS.  Tells me her blood pressure at home is labile. She will get 140/70 and 127/50 on the other arm.  Reports no lightheadedness. She reports sensation of spinning when she lays flat on her back as well as when she turns from side to side. She is never dizzy when up and moving. Does note history of vertigo and uses Epley maneuver with some relief. Notes when she tried to use it most recently she had nausea.  She attributes her dizziness during the ear problem and we discussed follow-up with primary care versus ENT. 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 Encouraged to discuss with her primary care provider.   EKGs/Labs/Other Studies Reviewed:   The following studies were reviewed  today:   EKG:  EKG is  ordered today.  The ekg ordered today demonstrates NSR 86 with known RBBB and no acute St/T wave changes.   Recent Labs: 04/19/2020: ALT 8; BUN 10; Creatinine, Ser 0.69; Hemoglobin 14.4; Platelets 241.0; Potassium 4.8; Sodium 136  Recent Lipid Panel    Component Value Date/Time   CHOL 174 04/14/2019 1026   TRIG 198.0 (H) 04/19/2020 1634   HDL 73 04/14/2019 1026   CHOLHDL 2.4 04/14/2019 1026   CHOLHDL 5.3 (H) 10/19/2015 0813   VLDL 44 (H) 10/19/2015 0813   LDLCALC 79 04/14/2019 1026   LDLDIRECT 124.0 08/17/2014 0909    Home Medications   Current Meds  Medication Sig  . ALPRAZolam (XANAX) 0.25 MG tablet Take 0.25 mg by mouth daily  as needed for anxiety. prn  . aspirin EC 81 MG tablet Take 1 tablet (81 mg total) by mouth daily.  . Calcium Carbonate-Vit D-Min (CALCIUM 1200 PO) Take 1,200 mg by mouth daily.  . cholecalciferol (VITAMIN D) 1000 units tablet Take 1,000 Units by mouth in the morning and at bedtime.  . cyclobenzaprine (FLEXERIL) 10 MG tablet Take 1 tablet by mouth at bedtime as needed.  Marland Kitchen denosumab (PROLIA) 60 MG/ML SOSY injection Inject 60 mg into the skin every 6 (six) months.   . esomeprazole (NEXIUM) 40 MG capsule Take 1 capsule (40 mg total) by mouth daily.  . Evolocumab (REPATHA) 140 MG/ML SOSY Inject into the skin every 14 (fourteen) days.  . Multiple Vitamin (MULTIVITAMIN) tablet Take 1 tablet by mouth daily.  . polyethylene glycol (MIRALAX / GLYCOLAX) 17 g packet 1 packet mixed with 8 ounces of fluid  . rosuvastatin (CRESTOR) 5 MG tablet TAKE 2 TABLETS BY MOUTH EVERY OTHER DAY, ALTERNATING 1 TABLET THE OTHER DAYS  . [DISCONTINUED] metoprolol tartrate (LOPRESSOR) 50 MG tablet Take 1 tablet (50 mg total) by mouth 2 (two) times daily.     Review of Systems   All other systems reviewed and are otherwise negative except as noted above.  Physical Exam    VS:  BP 134/80 (BP Location: Left Arm, Patient Position: Sitting, Cuff Size: Normal)    Pulse 86   Ht 5\' 4"  (1.626 m)   Wt 163 lb 12.8 oz (74.3 kg)   LMP  (LMP Unknown)   SpO2 98%   BMI 28.12 kg/m  , BMI Body mass index is 28.12 kg/m.  Wt Readings from Last 3 Encounters:  07/05/20 163 lb 12.8 oz (74.3 kg)  05/13/20 162 lb (73.5 kg)  04/19/20 166 lb 12.8 oz (75.7 kg)     GEN: Well nourished, well developed, in no acute distress. HEENT: normal. Neck: Supple, no JVD, carotid bruits, or masses. Cardiac: RRR, no murmurs, rubs, or gallops. No clubbing, cyanosis, edema.  Radials/DP/PT 2+ and equal bilaterally.  Respiratory:  Respirations regular and unlabored, clear to auscultation bilaterally. GI: Soft, nontender, nondistended. MS: No deformity or atrophy. Skin: Warm and dry, no rash. Neuro:  Strength and sensation are intact. Psych: Normal affect.  Assessment & Plan    1. Preoperative cardiovascular clearance - According to the Revised Cardiac Risk Index (RCRI), her Perioperative Risk of Major Cardiac Event is (%): 0.9. Her Functional Capacity in METs is: 5.07 according to the Duke Activity Status Index (DASI).  Given coronary artery calcification on CT would recommend continuation of aspirin throughout the perioperative period though would be permissible to hold at the discretion of the surgeon.  I will route this note to Dr. Debroah Loop office via [place] fax function.   2. Coronary artery calcification on CT -No anginal symptoms.  EKG today with no acute ST/T wave changes.  No negation for ischemic evaluation at this time.  GDMT includes aspirin out, metoprolol, rosuvastatin, Repatha.  3. Pancreatic cyst - Cancer workup unremarkable. Plan for serial MRI for monitoring vs excision with general surgery. Awaiting general surgery appt.   4. HLD - Continue Repatha, Crestor 5mg    5. HTN - BP elevated today in setting of significant pain related to hip and nerve pain.  Initial BP 140/84 with improvement to 134/80 without intervention.  Encouraged to bring her blood pressure cuff to  her next office visit to assess accuracy.  Continue present antihypertensive regimen of Lopressor 50 mg twice daily, refill provided.  6.  RBBB - Stable finding by EKG today.   7. Dizziness -sensation of spinning when lying down or turning head side to side while lying.  Likely etiology in her ear or vertigo.  Encouraged to follow-up with primary care provider.  No evidence of hypotension suggestive of cardiac etiology.  Disposition: Follow up in August as scheduled with Dr. Angelena Form to establish care.   Signed, Loel Dubonnet, NP 07/05/2020, 1:44 PM [facility]

## 2020-07-07 DIAGNOSIS — M5416 Radiculopathy, lumbar region: Secondary | ICD-10-CM | POA: Diagnosis not present

## 2020-07-09 DIAGNOSIS — H01005 Unspecified blepharitis left lower eyelid: Secondary | ICD-10-CM | POA: Diagnosis not present

## 2020-07-12 ENCOUNTER — Other Ambulatory Visit: Payer: Self-pay | Admitting: Neurological Surgery

## 2020-07-12 DIAGNOSIS — M5416 Radiculopathy, lumbar region: Secondary | ICD-10-CM

## 2020-07-16 ENCOUNTER — Other Ambulatory Visit: Payer: Self-pay

## 2020-07-16 ENCOUNTER — Ambulatory Visit
Admission: RE | Admit: 2020-07-16 | Discharge: 2020-07-16 | Disposition: A | Discharge status: Home / Self Care | Payer: Medicare Other | Source: Ambulatory Visit | Attending: Neurological Surgery | Admitting: Neurological Surgery

## 2020-07-16 DIAGNOSIS — M545 Low back pain, unspecified: Secondary | ICD-10-CM | POA: Diagnosis not present

## 2020-07-16 DIAGNOSIS — M5416 Radiculopathy, lumbar region: Secondary | ICD-10-CM

## 2020-07-23 DIAGNOSIS — K862 Cyst of pancreas: Secondary | ICD-10-CM | POA: Diagnosis not present

## 2020-07-27 ENCOUNTER — Ambulatory Visit: Payer: Medicare Other | Admitting: Physician Assistant

## 2020-07-28 ENCOUNTER — Other Ambulatory Visit: Payer: Self-pay | Admitting: Surgery

## 2020-07-28 DIAGNOSIS — M5416 Radiculopathy, lumbar region: Secondary | ICD-10-CM | POA: Diagnosis not present

## 2020-07-28 DIAGNOSIS — K862 Cyst of pancreas: Secondary | ICD-10-CM

## 2020-08-04 ENCOUNTER — Ambulatory Visit
Admission: RE | Admit: 2020-08-04 | Discharge: 2020-08-04 | Disposition: A | Discharge status: Home / Self Care | Payer: Medicare Other | Source: Ambulatory Visit | Attending: Surgery | Admitting: Surgery

## 2020-08-04 ENCOUNTER — Other Ambulatory Visit: Payer: Self-pay

## 2020-08-04 DIAGNOSIS — K862 Cyst of pancreas: Secondary | ICD-10-CM | POA: Diagnosis not present

## 2020-08-04 DIAGNOSIS — R935 Abnormal findings on diagnostic imaging of other abdominal regions, including retroperitoneum: Secondary | ICD-10-CM | POA: Diagnosis not present

## 2020-08-04 MED ORDER — GADOBENATE DIMEGLUMINE 529 MG/ML IV SOLN
15.0000 mL | Freq: Once | INTRAVENOUS | Status: AC | PRN
  Administered 2020-08-04: 15 mL via INTRAVENOUS

## 2020-08-09 DIAGNOSIS — M1612 Unilateral primary osteoarthritis, left hip: Secondary | ICD-10-CM | POA: Diagnosis present

## 2020-08-09 DIAGNOSIS — M25552 Pain in left hip: Secondary | ICD-10-CM | POA: Diagnosis not present

## 2020-08-09 NOTE — H&P (Addendum)
TOTAL HIP ADMISSION H&P  Patient is admitted for left total hip arthroplasty.  Subjective:  Chief Complaint: left hip pain  HPI: Renee [name], 80 y.o. female, has a history of pain and functional disability in the left hip(s) due to arthritis and patient has failed non-surgical conservative treatments for greater than 12 weeks to include NSAID's and/or analgesics, corticosteriod injections, viscosupplementation injections, flexibility and strengthening excercises, use of assistive devices, weight reduction as appropriate, and activity modification.  Onset of symptoms was gradual starting 10 years ago with gradually worsening course since that time.The patient noted no past surgery on the left hip(s).  Patient currently rates pain in the left hip at 10 out of 10 with activity. Patient has night pain, worsening of pain with activity and weight bearing, trendelenberg gait, pain that interfers with activities of daily living, and pain with passive range of motion. Patient has evidence of subchondral sclerosis, periarticular osteophytes, and joint space narrowing by imaging studies. This condition presents safety issues increasing the risk of falls. There is no current active infection.  Patient Active Problem List   Diagnosis Date Noted   Primary localized osteoarthritis of left hip 08/09/2020   RBBB 07/05/2020   Dizziness 07/05/2020   Lumbar pain 10/08/2017   Cervical spine pain 02/21/2017   Carotid stenosis 07/23/2015   Multiple pulmonary nodules 02/11/2015   Essential hypertension 07/08/2013   Sinus tachycardia 01/08/2013   Difficulty walking 11/04/2012   Pain of left hip joint 11/04/2012   Hyperlipidemia 10/15/2012   Dyspnea 02/09/2012   Past Medical History:  Diagnosis Date   Anemia    history only at age 19 yrs olds, no current problems   Aortic atherosclerosis ([place])    Arthritis    oa   Blood transfusion 1948   Blood transfusion without reported diagnosis    Breast cancer  ([place]) 1994   bilateral / HX skin cancer   Carotid atherosclerosis    Cataract    both eyes   COPD (chronic obstructive pulmonary disease) ([place])    no meds/no inaler   Diabetes ([place])    type 2 - diet controlled, no meds   Disc disease, degenerative, cervical    trouble turing head and neck at times   Diverticulosis    DVT (deep venous thrombosis) ([place]) 2002   left leg   Dyspnea    occasional with exertion   Fibromyalgia    GAD (generalized anxiety disorder)    History of vertigo    Hx of skin cancer, basal cell    Hyperlipidemia    Hypertension    Measles as child   Mumps age 30   Osteoarthritis    Osteoporosis    Pancreatic cyst    Pancreatitis    PONV (postoperative nausea and vomiting)    PONV   Pulmonary nodule    Right renal mass    Seborrheic keratosis    Sinus tachycardia    Sinus tachycardia    Tubular adenoma of colon     Past Surgical History:  Procedure Laterality Date   ABDOMINAL HYSTERECTOMY     achilles tendon adn 2 bone spurs removed Left 05-2012   APPENDECTOMY     BACK SURGERY  1976   lower   BASAL CELL CARCINOMA EXCISION     bilateral mastectomy with tramflap reconstruction  1994   BIOPSY  05/13/2020   Procedure: BIOPSY;  Surgeon: Irving Copas., MD;  Location: [facility] ENDOSCOPY;  Service: Gastroenterology;;   BREAST REDUCTION SURGERY  CARPAL TUNNEL RELEASE Right 03/2017   COLONOSCOPY     ESOPHAGOGASTRODUODENOSCOPY (EGD) WITH PROPOFOL N/A 05/13/2020   Procedure: ESOPHAGOGASTRODUODENOSCOPY (EGD) WITH PROPOFOL;  Surgeon: Irving Copas., MD;  Location: [facility];  Service: Gastroenterology;  Laterality: N/A;   EUS N/A 05/13/2020   Procedure: UPPER ENDOSCOPIC ULTRASOUND (EUS) RADIAL;  Surgeon: Irving Copas., MD;  Location: [facility];  Service: Gastroenterology;  Laterality: N/A;   FOOT SURGERY     KNEE ARTHROSCOPY Right 2008   KNEE SURGERY  2002   arthroscopy-bilateral   MASTECTOMY     bilateral   PARTIAL  HYSTERECTOMY     POLYPECTOMY     SHOULDER SURGERY Left    TONSILLECTOMY     TOTAL KNEE ARTHROPLASTY Left 10/14/2012   Procedure: LEFT TOTAL KNEE ARTHROPLASTY;  Surgeon: Gearlean Alf, MD;  Location: [facility] ORS;  Service: Orthopedics;  Laterality: Left;   TOTAL KNEE ARTHROPLASTY Right 10/20/2013   Procedure: RIGHT TOTAL KNEE ARTHROPLASTY;  Surgeon: Gearlean Alf, MD;  Location: [facility] ORS;  Service: Orthopedics;  Laterality: Right;    No current facility-administered medications for this encounter.   Current Outpatient Medications  Medication Sig Dispense Refill Last Dose   aspirin EC 81 MG tablet Take 1 tablet (81 mg total) by mouth daily. 90 tablet 3 08/09/2020   Calcium Carbonate-Vit D-Min (CALCIUM 1200 PO) Take 1,200 mg by mouth daily.   08/09/2020   cholecalciferol (VITAMIN D) 1000 units tablet Take 1,000 Units by mouth in the morning and at bedtime.   08/09/2020   cyclobenzaprine (FLEXERIL) 10 MG tablet Take 1 tablet by mouth at bedtime as needed.   Past Month   esomeprazole (NEXIUM) 40 MG capsule Take 1 capsule (40 mg total) by mouth daily. 30 capsule 6 08/09/2020   Evolocumab (REPATHA) 140 MG/ML SOSY Inject into the skin every 14 (fourteen) days.   08/08/2020   metoprolol tartrate (LOPRESSOR) 50 MG tablet Take 1 tablet (50 mg total) by mouth 2 (two) times daily. 180 tablet 3 08/09/2020   Multiple Vitamin (MULTIVITAMIN) tablet Take 1 tablet by mouth daily.   08/09/2020   polyethylene glycol (MIRALAX / GLYCOLAX) 17 g packet 1 packet mixed with 8 ounces of fluid   08/09/2020   ALPRAZolam (XANAX) 0.25 MG tablet Take 0.25 mg by mouth daily as needed for anxiety. prn   More than a month   denosumab (PROLIA) 60 MG/ML SOSY injection Inject 60 mg into the skin every 6 (six) months.    12/19/2019   Allergies  Allergen Reactions   Aspirin Nausea Only   Atorvastatin Other (See Comments)    Pt reports causes "muscle aches."   Codeine Itching    Mood changes, can take percocet   Ibuprofen Hives     Blisters    Lisinopril Cough    Social History   Tobacco Use   Smoking status: Former    Packs/day: 1.00    Years: 20.00    Pack years: 20.00    Types: Cigarettes    Quit date: 02/11/1992    Years since quitting: 28.5   Smokeless tobacco: Never  Substance Use Topics   Alcohol use: Yes    Alcohol/week: 5.0 standard drinks    Types: 5 Glasses of wine per week    Comment: occasional wine    Family History  Problem Relation Age of Onset   Alzheimer's disease Mother    Alcohol abuse Father    Sudden death Father    Liver disease Father  fatty liver disease; alcohol abuse   Other Father        gastritis   Healthy Brother    Alcohol abuse Daughter    COPD Daughter    Healthy Grandchild    Heart disease Maternal Grandfather    CAD Maternal Grandfather    Sudden death Brother        at birth   Unexplained death Daughter    Alcohol abuse Daughter    Healthy Sister    Colon cancer Neg Hx    Colon polyps Neg Hx    Rectal cancer Neg Hx    Stomach cancer Neg Hx    Esophageal cancer Neg Hx      Review of Systems  Constitutional: Negative.   HENT: Negative.    Eyes: Negative.   Respiratory: Negative.    Cardiovascular: Negative.   Endocrine: Negative.   Genitourinary: Negative.   Musculoskeletal:  Positive for back pain and gait problem.  Skin: Negative.   Allergic/Immunologic: Negative.   Hematological: Negative.   Psychiatric/Behavioral: Negative.     Objective:  Physical Exam Constitutional:      Appearance: Normal appearance.  HENT:     Head: Normocephalic and atraumatic.     Right Ear: External ear normal.     Left Ear: External ear normal.     Nose: Nose normal.     Mouth/Throat:     Mouth: Mucous membranes are moist.  Eyes:     Conjunctiva/sclera: Conjunctivae normal.  Cardiovascular:     Rate and Rhythm: Normal rate and regular rhythm.  Pulmonary:     Effort: Pulmonary effort is normal.     Breath sounds: Normal breath sounds.  Abdominal:      Palpations: Abdomen is soft.  Genitourinary:    Comments: Not pertinent to current symptomatology therefore not examined.  Musculoskeletal:     Cervical back: Neck supple.     Comments: Patient ambulates with a Trendelenburg gait. Pain with left hip flexion and rotation.  She has limited internal and external rotation.  DNVI  Skin:    General: Skin is warm and dry.     Capillary Refill: Capillary refill takes less than 2 seconds.  Neurological:     General: No focal deficit present.     Mental Status: She is alert.  Psychiatric:        Mood and Affect: Mood normal.        Behavior: Behavior normal.    Vital signs in last 24 hours: Temp:  [98.2 F (36.8 C)] 98.2 F (36.8 C) (07/11 1400) Pulse Rate:  [85] 85 (07/11 1400) BP: (141)/(82) 141/82 (07/11 1400) SpO2:  [98 %] 98 % (07/11 1400) Weight:  [74.8 kg] 74.8 kg (07/11 1400)  Labs:   Estimated body mass index is 29.23 kg/m as calculated from the following:   Height as of this encounter: 5\' 3"  (1.6 m).   Weight as of this encounter: 74.8 kg.   Imaging Review Plain radiographs demonstrate severe degenerative joint disease of the left hip(s). The bone quality appears to be good for age and reported activity level.      Assessment/Plan:  End stage arthritis, left hip(s) Principal Problem:   Primary localized osteoarthritis of left hip Active Problems:   Difficulty walking   Pain of left hip joint   Essential hypertension   Multiple pulmonary nodules   Carotid stenosis   RBBB   Lumbar pain   The patient history, physical examination, clinical judgement of the provider and  imaging studies are consistent with end stage degenerative joint disease of the left hip(s) and total hip arthroplasty is deemed medically necessary. The treatment options including medical management, injection therapy, arthroscopy and arthroplasty were discussed at length. The risks and benefits of total hip arthroplasty were presented and  reviewed. The risks due to aseptic loosening, infection, stiffness, dislocation/subluxation,  thromboembolic complications and other imponderables were discussed.  The patient acknowledged the explanation, agreed to proceed with the plan and consent was signed. Patient is being admitted for inpatient treatment for surgery, pain control, PT, OT, prophylactic antibiotics, VTE prophylaxis, progressive ambulation and ADL's and discharge planning.The patient is planning to be discharged to home same day.  Physical therapy on 09/09/20 at 10 pm   Appt with Dr Percell Miller on 09/20/20 at 12:30  THIS PATIENT HAS A HISTORY OF SEVERE POST OPERATIVE NAUSEA AND VOMITING.  I HAVE SENT IN A SCOPOLAMINE PATCH FOR HER TO PUT ON THE DAY OF SURGERY PREOPERATIVELY.  I HAVE ALSO SENT IN A SCRIPT FOR PHENERGAN 12.5 MG Q 8 HOURS PRN NAUSEA.  NO NSAIDS BECAUSE SHE GETS BLISTERS AND ULCERATIONS FROM THEM.  NO MUSCLE RELAXERS WILL BE WRITTEN BECAUSE SHE HAS CYCLOBENZAPRINE (FLEXERIL) FROM HER PCP THAT SHE TAKES AS NEEDED  Yee Joss A. Kaleen Mask Physician Assistant [facility] [phone]  08/09/2020, 2:51 PM

## 2020-08-12 ENCOUNTER — Other Ambulatory Visit: Payer: Self-pay | Admitting: Physician Assistant

## 2020-08-12 DIAGNOSIS — I10 Essential (primary) hypertension: Secondary | ICD-10-CM

## 2020-08-17 ENCOUNTER — Ambulatory Visit: Payer: Self-pay | Admitting: Surgery

## 2020-08-17 DIAGNOSIS — D490 Neoplasm of unspecified behavior of digestive system: Secondary | ICD-10-CM | POA: Diagnosis not present

## 2020-08-20 NOTE — H&P (View-Only) (Signed)
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 She underwent an EUS in April by Dr. Rush Landmark, at which time the appearance of the cyst was suspicious for an IPMN. FNA did not show any malignant cells, and fluid cells showed elevated amylase and low CEA, more consistent with a pseudocyst. The patient did have an episode of acute pancreatitis involving the tail of the pancreas about 2 years ago, but has otherwise not had any pancreatitis.    At her initial consult with me about a month ago we discussed treatment options, including close surveillance vs surgical resection. Given that no definitive malignancy was identified on EUS, and that the cyst was less than 2cm in size, I recommended close surveillance with repeat MRI 3 months after her EUS. The MRI was recently completed earlier this month on 7/6. This showed that the cyst was relatively stable but there was a small irregularity in the pancreatic duct concerning for occult neoplasm.       Review of Systems: A complete review of systems was obtained from the patient.  I have reviewed this information and discussed as appropriate with the patient.  See HPI as well for other ROS.   Review of Systems  Constitutional: Negative for chills and fever.  Gastrointestinal: Negative for abdominal pain, nausea and vomiting.        Medical History: Past Medical History Past Medical History: Diagnosis Date  Arthritis    COPD (chronic obstructive pulmonary disease) (CMS-HCC)    DVT (deep venous thrombosis) (CMS-HCC)    GERD (gastroesophageal reflux disease)    History of cancer    Hyperlipidemia        Patient Active Problem List Diagnosis  Pancreatic cyst     Past  Surgical History No past surgical history on file.     Allergies Allergies Allergen Reactions  Codeine Itching     Mood changes, can take percocet Mood changes, can take percocet    Atorvastatin Other (See Comments)     Pt reports causes "muscle aches." Pt reports causes "muscle aches."    Ibuprofen Hives     Blisters  Blisters     Aspirin Nausea  Lisinopril Cough      Current Outpatient Medications on File Prior to Visit Medication Sig Dispense Refill  metoprolol tartrate (LOPRESSOR) 50 MG tablet Take 50 mg by mouth 2 (two) times daily      REPATHA SURECLICK 741 mg/mL PnIj Inject 1 mL subcutaneously every 14 (fourteen) days       No current facility-administered medications on file prior to visit.     Family History No family history on file.     Social History   Tobacco Use Smoking Status Former Smoker  Quit date: 1993  Years since quitting: 29.5 Smokeless Tobacco Never Used     Social History Social History    Socioeconomic History  Marital status: Married Tobacco Use  Smoking status: Former Smoker     Quit date: 1993     Years since quitting: 29.5  Smokeless tobacco: Never Used Surveyor, mining Use: Never used Substance and Sexual Activity  Alcohol use: Never  Drug use: Never  Sexual activity: Defer      Objective:     There were no vitals filed for this visit.  There is no height or weight on file to calculate BMI.   Physical Exam Constitutional:      General: She is not in acute distress.    Appearance: Normal appearance.  HENT:     Head: Normocephalic and atraumatic.  Pulmonary:     Effort: Pulmonary effort is normal. No respiratory distress.     Breath sounds: Normal breath sounds.  Abdominal:     General: There is no distension.     Palpations: Abdomen is soft.     Tenderness: There is no abdominal tenderness.     Comments: Well-healed surgical scars.  Musculoskeletal:        General: No swelling or deformity. Normal  range of motion.     Cervical back: Normal range of motion.  Skin:    General: Skin is warm and dry.     Coloration: Skin is not jaundiced.  Neurological:     General: No focal deficit present.     Mental Status: She is alert and oriented to person, place, and time.  Psychiatric:        Mood and Affect: Mood normal.        Behavior: Behavior normal.        Thought Content: Thought content normal.          Labs, Imaging and Diagnostic Testing: MRCP 08/04/20: IMPRESSION: 1. Subtle irregularity of parenchyma near the site of ductal transition in the tail of the pancreas raising the question of small, nearly occult pancreatic neoplasm leading to peripheral ductal dilation. Endoscopic ultrasound is suggested for further evaluation. 2. Peripheral duct dilation and cystic area largely stable about the tail of the pancreas associated with parenchymal atrophy. Mixed type intraductal papillary mucinous neoplasm remains in the differential. Ductal dilation from potential neoplasm as outlined above is the current diagnosis of exclusion. CT pancreatic protocol could also be considered for improved spatial resolution and may be helpful to guide further management. 3. Sludge in the gallbladder. No signs of biliary duct dilation.     Assessment and Plan: Diagnoses and all orders for this visit:   IPMN (intraductal papillary mucinous neoplasm) -     CCS Case Posting Request; Future       80 yo female presenting for follow up of a pancreatic tail cyst. I reviewed her most recent MRCP, which shows a change in caliber of the main pancreatic duct, with dilation distal to the cyst. There is a new small irregularity adjacent to the cyst. Given these progressive changes with mild elevation in her CA19-9, as well as a previous episode of acute pancreatitis, I think this lesion has become increasingly suspicious for an IPMN with possible malignancy. Although FNA was benign on EUS, it is difficult to  exclude an early pancreatic cancer, and the patient has had increase in size of the cyst in the last 2 years with associated change in caliber of the pancreatic duct, all of which are concerning findings. The patient is an appropriate surgical candidate so I recommended proceeding with laparoscopic distal pancreatectomy and splenectomy. I discussed the details of this operation with the patient, including the risks of bleeding, infection and 20% risk of pancreatic fistula. I also discussed the possibility of converting to an open procedure if bleeding is encountered or if there are extensive adhesions from her prior pancreatitis. She expressed understanding and agrees to proceed. This  will be scheduled for August 1. She was provided with a script to obtain splenectomy vaccines from her PCP prior to surgery.  Michaelle Birks, [facility], Hepatobiliary and Pancreatic Surgery 08/20/20 4:53 PM

## 2020-08-20 NOTE — H&P (Signed)
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 She underwent an EUS in April by Dr. Rush [name], at which time the appearance of the cyst was suspicious for an IPMN. FNA did not show any malignant cells, and fluid cells showed elevated amylase and low CEA, more consistent with a pseudocyst. The patient did have an episode of acute pancreatitis involving the tail of the pancreas about 2 years ago, but has otherwise not had any pancreatitis.    At her initial consult with me about a month ago we discussed treatment options, including close surveillance vs surgical resection. Given that no definitive malignancy was identified on EUS, and that the cyst was less than 2cm in size, I recommended close surveillance with repeat MRI 3 months after her EUS. The MRI was recently completed earlier this month on 7/6. This showed that the cyst was relatively stable but there was a small irregularity in the pancreatic duct concerning for occult neoplasm.       Review of Systems: A complete review of systems was obtained from the patient.  I have reviewed this information and discussed as appropriate with the patient.  See HPI as well for other ROS.   Review of Systems  Constitutional: Negative for chills and fever.  Gastrointestinal: Negative for abdominal pain, nausea and vomiting.        Medical History: Past Medical History Past Medical History: Diagnosis Date  Arthritis    COPD (chronic obstructive pulmonary disease) (CMS-HCC)    DVT (deep venous thrombosis) (CMS-HCC)    GERD (gastroesophageal reflux disease)    History of cancer    Hyperlipidemia        Patient Active Problem List Diagnosis  Pancreatic cyst     Past  Surgical History No past surgical history on file.     Allergies Allergies Allergen Reactions  Codeine Itching     Mood changes, can take percocet Mood changes, can take percocet    Atorvastatin Other (See Comments)     Pt reports causes "muscle aches." Pt reports causes "muscle aches."    Ibuprofen Hives     Blisters  Blisters     Aspirin Nausea  Lisinopril Cough      Current Outpatient Medications on File Prior to Visit Medication Sig Dispense Refill  metoprolol tartrate (LOPRESSOR) 50 MG tablet Take 50 mg by mouth 2 (two) times daily      REPATHA SURECLICK 093 mg/mL PnIj Inject 1 mL subcutaneously every 14 (fourteen) days       No current facility-administered medications on file prior to visit.     Family History No family history on file.     Social History   Tobacco Use Smoking Status Former Smoker  Quit date: 1993  Years since quitting: 29.5 Smokeless Tobacco Never Used     Social History Social History    Socioeconomic History  Marital status: Married Tobacco Use  Smoking status: Former Smoker     Quit date: 1993     Years since quitting: 29.5  Smokeless tobacco: Never Used Surveyor, mining Use: Never used Substance and Sexual Activity  Alcohol use: Never  Drug use: Never  Sexual activity: Defer      Objective:     There were no vitals filed for this visit.  There is no height or weight on file to calculate BMI.   Physical Exam Constitutional:      General: She is not in acute distress.    Appearance: Normal appearance.  HENT:     Head: Normocephalic and atraumatic.  Pulmonary:     Effort: Pulmonary effort is normal. No respiratory distress.     Breath sounds: Normal breath sounds.  Abdominal:     General: There is no distension.     Palpations: Abdomen is soft.     Tenderness: There is no abdominal tenderness.     Comments: Well-healed surgical scars.  Musculoskeletal:        General: No swelling or deformity. Normal  range of motion.     Cervical back: Normal range of motion.  Skin:    General: Skin is warm and dry.     Coloration: Skin is not jaundiced.  Neurological:     General: No focal deficit present.     Mental Status: She is alert and oriented to person, place, and time.  Psychiatric:        Mood and Affect: Mood normal.        Behavior: Behavior normal.        Thought Content: Thought content normal.          Labs, Imaging and Diagnostic Testing: MRCP 08/04/20: IMPRESSION: 1. Subtle irregularity of parenchyma near the site of ductal transition in the tail of the pancreas raising the question of small, nearly occult pancreatic neoplasm leading to peripheral ductal dilation. Endoscopic ultrasound is suggested for further evaluation. 2. Peripheral duct dilation and cystic area largely stable about the tail of the pancreas associated with parenchymal atrophy. Mixed type intraductal papillary mucinous neoplasm remains in the differential. Ductal dilation from potential neoplasm as outlined above is the current diagnosis of exclusion. CT pancreatic protocol could also be considered for improved spatial resolution and may be helpful to guide further management. 3. Sludge in the gallbladder. No signs of biliary duct dilation.     Assessment and Plan: Diagnoses and all orders for this visit:   IPMN (intraductal papillary mucinous neoplasm) -     CCS Case Posting Request; Future       80 yo female presenting for follow up of a pancreatic tail cyst. I reviewed her most recent MRCP, which shows a change in caliber of the main pancreatic duct, with dilation distal to the cyst. There is a new small irregularity adjacent to the cyst. Given these progressive changes with mild elevation in her CA19-9, as well as a previous episode of acute pancreatitis, I think this lesion has become increasingly suspicious for an IPMN with possible malignancy. Although FNA was benign on EUS, it is difficult to  exclude an early pancreatic cancer, and the patient has had increase in size of the cyst in the last 2 years with associated change in caliber of the pancreatic duct, all of which are concerning findings. The patient is an appropriate surgical candidate so I recommended proceeding with laparoscopic distal pancreatectomy and splenectomy. I discussed the details of this operation with the patient, including the risks of bleeding, infection and 20% risk of pancreatic fistula. I also discussed the possibility of converting to an open procedure if bleeding is encountered or if there are extensive adhesions from her prior pancreatitis. She expressed understanding and agrees to proceed. This  will be scheduled for August 1. She was provided with a script to obtain splenectomy vaccines from her PCP prior to surgery.  Michaelle Birks, [facility], Hepatobiliary and Pancreatic Surgery 08/20/20 4:53 PM

## 2020-08-26 NOTE — Progress Notes (Addendum)
PCP - Dr. Deland Pretty  Cardiologist - Dr. Angelena Form.  Was seen in office on 08/09/20 for cardiac clearance for hip surgery.  EP-none  Endocrine-  Pulm- Dr Lamonte Sakai  Chest x-ray -   EKG - 07/05/20  Stress Test - 2017  ECHO - 2014  Cardiac Cath - never  AICD-no PM-no LOOP-no  Dialysis-no  Sleep Study -  CPAP -   LABS-T/S Prepare 2 units of blood and keep 2 units ahead  ASA-  ERAS-yes- Clear liquids until 0430  HA1C- Fasting Blood Sugar -  Checks Blood Sugar _____ times a day  Anesthesia-  Pt denies having chest pain, sob, or fever at this time. All instructions explained to the pt, with a verbal understanding of the material. Pt agrees to go over the instructions while at home for a better understanding. Pt also instructed to self quarantine after being tested for COVID-19. The opportunity to ask questions was provided.

## 2020-08-26 NOTE — Pre-Procedure Instructions (Signed)
Renee [name]  08/26/2020     Your procedure is scheduled on Monday, August 1st..  Report to [place] at 5:30 AM                Your surgery or procedure is scheduled to begin at  7:30 AM.   Call this number if you have problems the morning of [phone]  this is the pre -surgery desk.   Remember:  Do not eat  after midnight.  You may drink clear liquids until 4:30 AM .   Clear liquids allowed are:  Water, Juice (non-citric and without pulp - diabetics please choose diet or no sugar options), Carbonated beverages - (diabetics please choose diet or no sugar options), Clear Tea, Black Coffee only (no creamer, milk or cream including half and half), Plain Jell-O only (diabetics please choose diet or no sugar options), Gatorade (diabetics please choose diet or no sugar options), and Plain Popsicles only    Take these medicines the morning of surgery with A SIP OF WATER: esomeprazole (NEXIUM) metoprolol tartrate (LOPRESSOR) scopolamine (TRANSDERM-SCOP)  Take if needed: promethazine (PHENERGAN) cyclobenzaprine (FLEXERIL)   Follow Dr. Ayesha Rumpf instructions regarding Aspirin.  STOP taking Aspirin Products (Goody Powder, Excedrin Migraine), Ibuprofen (Advil), Naproxen (Aleve), Vitamins and Herbal Products (ie Fish Oil).    [place]- Preparing For Surgery  Before surgery, you can play an important role. Because skin is not sterile, your skin needs to be as free of germs as possible. You can reduce the number of germs on your skin by washing with CHG (chlorahexidine gluconate) Soap before surgery.  CHG is an antiseptic cleaner which kills germs and bonds with the skin to continue killing germs even after washing.    Oral Hygiene is also important to reduce your risk of infection.  Remember - BRUSH YOUR TEETH THE MORNING OF SURGERY WITH YOUR REGULAR TOOTHPASTE  Please do not use if you have an allergy to CHG or antibacterial soaps. If your skin becomes  reddened/irritated stop using the CHG.  Do not shave (including legs and underarms) for at least 48 hours prior to first CHG shower. It is OK to shave your face.  Please follow these instructions carefully.   Shower the NIGHT BEFORE SURGERY and the MORNING OF SURGERY with CHG.   If you chose to wash your hair, wash your hair first as usual with your normal shampoo.  After you shampoo, rinse your hair and body thoroughly to remove the shampoo. Wash your face and Private area with your home soap, then rinse thoroughly.  Use CHG as you would any other liquid soap. You can apply CHG directly to the skin and wash gently with a scrungie or a clean washcloth.   Apply the CHG Soap to your body ONLY FROM THE NECK DOWN.  Do not use on open wounds or open sores. Avoid contact with your eyes, ears, mouth and genitals (private parts).  Wash thoroughly, paying special attention to the area where your surgery will be performed.  Thoroughly rinse your body with warm water from the neck down.  DO NOT shower/wash with your normal soap after using and rinsing off the CHG Soap.  Pat yourself dry with a CLEAN TOWEL.  Wear CLEAN PAJAMAS to bed the night before surgery, wear comfortable clothes the morning of surgery  Place CLEAN SHEETS on your bed the night of your first shower and DO NOT SLEEP WITH PETS.  Day of Surgery: Shower as instructed  above.  Do not wear lotions, powders, or perfumes, or deodorant. Please wear clean clothes to the hospital/surgery center.   Remember to brush your teeth WITH YOUR REGULAR TOOTHPASTE.   Do not wear jewelry, make-up or nail polish, artificial or acrylic nails  Do not shave 48 hours prior to surgery.  Men may shave face and neck.  Do not bring valuables to the hospital.  [facility] is not responsible for any belongings or valuables.  Contacts, dentures or bridgework may not be worn into surgery.  Leave your suitcase in the car.  After surgery it may be brought to  your room. For patients admitted to the hospital, discharge time will be determined by your treatment team. fact sheets that you were given.  Please review the handouts you were given.

## 2020-08-27 ENCOUNTER — Encounter (HOSPITAL_COMMUNITY)
Admission: RE | Admit: 2020-08-27 | Discharge: 2020-08-27 | Disposition: A | Discharge status: Home / Self Care | Payer: Medicare Other | Source: Ambulatory Visit | Attending: Surgery | Admitting: Surgery

## 2020-08-27 ENCOUNTER — Encounter (HOSPITAL_COMMUNITY): Admission: RE | Admit: 2020-08-27 | Payer: Medicare Other | Source: Ambulatory Visit

## 2020-08-27 ENCOUNTER — Encounter (HOSPITAL_COMMUNITY): Payer: Self-pay

## 2020-08-27 ENCOUNTER — Other Ambulatory Visit: Payer: Self-pay

## 2020-08-27 DIAGNOSIS — R42 Dizziness and giddiness: Secondary | ICD-10-CM | POA: Insufficient documentation

## 2020-08-27 DIAGNOSIS — Z7982 Long term (current) use of aspirin: Secondary | ICD-10-CM | POA: Insufficient documentation

## 2020-08-27 DIAGNOSIS — J449 Chronic obstructive pulmonary disease, unspecified: Secondary | ICD-10-CM | POA: Insufficient documentation

## 2020-08-27 DIAGNOSIS — Z20822 Contact with and (suspected) exposure to covid-19: Secondary | ICD-10-CM | POA: Insufficient documentation

## 2020-08-27 DIAGNOSIS — K869 Disease of pancreas, unspecified: Secondary | ICD-10-CM | POA: Insufficient documentation

## 2020-08-27 DIAGNOSIS — Z96653 Presence of artificial knee joint, bilateral: Secondary | ICD-10-CM | POA: Insufficient documentation

## 2020-08-27 DIAGNOSIS — D490 Neoplasm of unspecified behavior of digestive system: Secondary | ICD-10-CM | POA: Diagnosis not present

## 2020-08-27 DIAGNOSIS — Z90710 Acquired absence of both cervix and uterus: Secondary | ICD-10-CM | POA: Insufficient documentation

## 2020-08-27 DIAGNOSIS — R918 Other nonspecific abnormal finding of lung field: Secondary | ICD-10-CM | POA: Insufficient documentation

## 2020-08-27 DIAGNOSIS — Z853 Personal history of malignant neoplasm of breast: Secondary | ICD-10-CM | POA: Insufficient documentation

## 2020-08-27 DIAGNOSIS — I10 Essential (primary) hypertension: Secondary | ICD-10-CM | POA: Insufficient documentation

## 2020-08-27 DIAGNOSIS — Z79899 Other long term (current) drug therapy: Secondary | ICD-10-CM | POA: Insufficient documentation

## 2020-08-27 DIAGNOSIS — Z7983 Long term (current) use of bisphosphonates: Secondary | ICD-10-CM | POA: Insufficient documentation

## 2020-08-27 DIAGNOSIS — Z87891 Personal history of nicotine dependence: Secondary | ICD-10-CM | POA: Insufficient documentation

## 2020-08-27 DIAGNOSIS — K219 Gastro-esophageal reflux disease without esophagitis: Secondary | ICD-10-CM | POA: Insufficient documentation

## 2020-08-27 DIAGNOSIS — K8689 Other specified diseases of pancreas: Secondary | ICD-10-CM | POA: Diagnosis not present

## 2020-08-27 DIAGNOSIS — K862 Cyst of pancreas: Secondary | ICD-10-CM | POA: Insufficient documentation

## 2020-08-27 DIAGNOSIS — E119 Type 2 diabetes mellitus without complications: Secondary | ICD-10-CM | POA: Insufficient documentation

## 2020-08-27 DIAGNOSIS — I2510 Atherosclerotic heart disease of native coronary artery without angina pectoris: Secondary | ICD-10-CM | POA: Insufficient documentation

## 2020-08-27 DIAGNOSIS — F411 Generalized anxiety disorder: Secondary | ICD-10-CM | POA: Insufficient documentation

## 2020-08-27 DIAGNOSIS — Z01812 Encounter for preprocedural laboratory examination: Secondary | ICD-10-CM | POA: Insufficient documentation

## 2020-08-27 DIAGNOSIS — I6529 Occlusion and stenosis of unspecified carotid artery: Secondary | ICD-10-CM | POA: Insufficient documentation

## 2020-08-27 DIAGNOSIS — M797 Fibromyalgia: Secondary | ICD-10-CM | POA: Insufficient documentation

## 2020-08-27 DIAGNOSIS — R0600 Dyspnea, unspecified: Secondary | ICD-10-CM | POA: Insufficient documentation

## 2020-08-27 DIAGNOSIS — E785 Hyperlipidemia, unspecified: Secondary | ICD-10-CM | POA: Insufficient documentation

## 2020-08-27 DIAGNOSIS — Z8719 Personal history of other diseases of the digestive system: Secondary | ICD-10-CM | POA: Insufficient documentation

## 2020-08-27 DIAGNOSIS — R54 Age-related physical debility: Secondary | ICD-10-CM | POA: Diagnosis not present

## 2020-08-27 DIAGNOSIS — D136 Benign neoplasm of pancreas: Secondary | ICD-10-CM | POA: Diagnosis not present

## 2020-08-27 DIAGNOSIS — Z86718 Personal history of other venous thrombosis and embolism: Secondary | ICD-10-CM | POA: Insufficient documentation

## 2020-08-27 HISTORY — DX: Depression, unspecified: F32.A

## 2020-08-27 HISTORY — DX: Constipation, unspecified: K59.00

## 2020-08-27 HISTORY — DX: Family history of other specified conditions: Z84.89

## 2020-08-27 HISTORY — DX: Cardiac arrhythmia, unspecified: I49.9

## 2020-08-27 LAB — CBC
HCT: 45.3 % (ref 36.0–46.0)
Hemoglobin: 15.1 g/dL — ABNORMAL HIGH (ref 12.0–15.0)
MCH: 33.9 pg (ref 26.0–34.0)
MCHC: 33.3 g/dL (ref 30.0–36.0)
MCV: 101.6 fL — ABNORMAL HIGH (ref 80.0–100.0)
Platelets: 336 10*3/uL (ref 150–400)
RBC: 4.46 MIL/uL (ref 3.87–5.11)
RDW: 11.9 % (ref 11.5–15.5)
WBC: 6.2 10*3/uL (ref 4.0–10.5)
nRBC: 0 % (ref 0.0–0.2)

## 2020-08-27 LAB — SARS CORONAVIRUS 2 (TAT 6-24 HRS): SARS Coronavirus 2: NEGATIVE

## 2020-08-27 LAB — HEMOGLOBIN A1C
Hgb A1c MFr Bld: 5.7 % — ABNORMAL HIGH (ref 4.8–5.6)
Mean Plasma Glucose: 116.89 mg/dL

## 2020-08-27 LAB — PREPARE RBC (CROSSMATCH)

## 2020-08-27 MED ORDER — CEFAZOLIN SODIUM-DEXTROSE 2-4 GM/100ML-% IV SOLN: 2.0000 g | INTRAVENOUS | Status: DC

## 2020-08-27 MED ORDER — BUPIVACAINE LIPOSOME 1.3 % IJ SUSP: 10.0000 mL | Freq: Once | INTRAMUSCULAR | Status: DC

## 2020-08-27 MED ORDER — DEXAMETHASONE SODIUM PHOSPHATE 10 MG/ML IJ SOLN: 8.0000 mg | Freq: Once | INTRAMUSCULAR | Status: DC

## 2020-08-27 MED ORDER — POVIDONE-IODINE 7.5 % EX SOLN
Freq: Once | CUTANEOUS | Status: DC
  Filled 2020-08-27: qty 118

## 2020-08-27 MED ORDER — POVIDONE-IODINE 10 % EX SWAB: 2.0000 "application " | Freq: Once | CUTANEOUS | Status: DC

## 2020-08-27 MED ORDER — TRANEXAMIC ACID-NACL 1000-0.7 MG/100ML-% IV SOLN: 1000.0000 mg | INTRAVENOUS | Status: DC

## 2020-08-27 MED ORDER — POVIDONE-IODINE 10 % EX SWAB
2.0000 | Freq: Once | CUTANEOUS | Status: DC
Start: 2020-08-27 — End: 2020-08-28

## 2020-08-27 MED ORDER — LACTATED RINGERS IV SOLN: INTRAVENOUS | Status: DC

## 2020-08-27 NOTE — Progress Notes (Addendum)
PCP - Dr. Deland Pretty requested records, Last office orders, labs  Cardiologist - Dr. Angelena Form.  Was seen in office on 08/09/20 for cardiac clearance for hip surgery.  EP-none  Endocrine-none  Pulm- Dr Lamonte Sakai  Chest x-ray -   EKG - 07/05/20  Stress Test - 2017  ECHO - 2014  Cardiac Cath - never  AICD-no PM-no LOOP-no  Dialysis-no  Sleep Study - greater than 20 years ago CPAP - no  LABS-T/S Prepare 2 units of blood and keep 2 units ahead, CBC, BMP- BMP was not released at the same time as CBC- will beed to be drawn the day of surgery.  ASA- continue per Dr. Devin Going- Clear liquids until 0430  HA1C- requested from PCP- patient is diet controlled an does not check CBGs Fasting Blood Sugar - no Checks Blood Sugar ___no__ times a day  Anesthesia-  Pt denies having chest pain, sob, or fever at this time. All instructions explained to the pt, with a verbal understanding of the material. Pt agrees to go over the instructions while at home for a better understanding. Pt also instructed to self quarantine after being tested for COVID-19. The opportunity to ask questions was provided.   Mrs Renee [name] does not have a Scopolamine patch.

## 2020-08-27 NOTE — Progress Notes (Signed)
Anesthesia Chart Review:  Case: [id number] Date/Time: 08/30/20 0715   Procedures:      LAPAROSCOPIC POSSIBLE OPEN DISTAL PANCREATECTOMY     SPLENECTOMY WITH INTRAOPERATIVE ULTRASOUND   Anesthesia type: General   Pre-op diagnosis: pancreatic mass   Location: [facility] OR ROOM 02 / [place] [facility]   Surgeons: Dwan Bolt, MD       DISCUSSION: Patient is an 80 year old female scheduled for the above procedure.  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 She underwent EUS by Dr. Rush Landmark in April with appearance of the cyst suspicious for an IPMN, but the FNA did not show any malignant cells, and the fluid cells showed elevated amylase and low CEA more consistent with pseudocyst.  On most recent imaging there was a new small irregularity adjacent to the cyst,. and there was a mild elevation in her CA19-9.  Given these changes there was concern the lesion was suspicious for an IPMN with possible malignancy and the above procedure recommended.   History includes former smoker (quit 02/11/92), post-operative N/V, HTN, HLD, DVT (LLE 2002), dysrhythmia/sinus tachycardia, DM2, exertional dyspnea, COPD, pulmonary nodules (stable tiny pulmonary nodules c/w benign postinflammatory etiology 04/16/19 CT), GAD, vertigo, fibromyalgia, carotid atherosclerosis (1-39% BICA 07/2017), skin cancer (BCC), breast cancer, renal mass (9 mm left renal lesion 10/20/19, stable and c/w hemorrhagic cyst by remote MRI), pancreatitis, GERD. Coronary calcification by chest CT dating back to atleast 2017.   She was seen for preoperative cardiac evaluation on 07/06/2019 by Laurann Montana, NP. She was seen specifically for preoperative evaluation for THA, but notes that patient may require additional procedure(s) for  suspicious pancreatic findings.  She wrote: "Preoperative cardiovascular clearance - According to the Revised Cardiac Risk Index (RCRI),  her Perioperative Risk of Major Cardiac Event is (%): 0.9. Her Functional Capacity in METs is: 5.07 according to the Duke Activity Status Index (DASI).  Given coronary artery calcification on CT would recommend continuation of aspirin throughout the perioperative period though would be permissible to hold at the discretion of the surgeon.  I will route this note to Dr. Debroah Loop office via [place] fax function.    Coronary artery calcification on CT -No anginal symptoms.  EKG today with no acute ST/T wave changes.  No negation for ischemic evaluation at this time.  GDMT includes aspirin out, metoprolol, rosuvastatin, Repatha."   08/27/20 presurgical COVID-19 test in process. There are no CBC or BMET results from her 08/27/20 PAT visit. Staff are seeing if these can be added on to other labs done on 08/27/20, but if not then they will need to be done on the day of surgery.  Anesthesia team to evaluate on the day of surgery.   VS: BP (!) 141/66   Pulse 82   Temp 36.7 C (Oral)   Resp 18   Ht 5\' 3"  (1.6 m)   Wt 74.6 kg   LMP  (LMP Unknown)   SpO2 98%   BMI 29.12 kg/m    PROVIDERS: Deland Pretty, MD is PCP  Lauree Chandler, MD is cardiologist, previously saw Ena Dawley, MD who recently relocated. Derek Jack, MD is Clinton Quant, MD is pulmonologist. Last visit 10/24/18 for COPD and pulmonary nodules.  Scans have been stable for ~ 5 years, so did not feel she would need repeat scans from his perspective unless a clinical change.  Kelly Splinter, MD is GI  LABS: See DISCUSSION. (all labs ordered are listed, but only abnormal results are displayed)  Labs Reviewed  HEMOGLOBIN A1C - Abnormal; Notable for the following components:      Result Value   Hgb A1c MFr Bld 5.7 (*)    All other components within normal limits  SARS CORONAVIRUS 2 (TAT 6-24 HRS)  TYPE AND SCREEN  PREPARE RBC (CROSSMATCH)     IMAGES: Abd MRI/MRCP 08/04/20: IMPRESSION: 1. Subtle irregularity  of parenchyma near the site of ductal transition in the tail of the pancreas raising the question of small, nearly occult pancreatic neoplasm leading to peripheral ductal dilation. Endoscopic ultrasound is suggested for further evaluation. 2. Peripheral duct dilation and cystic area largely stable about the tail of the pancreas associated with parenchymal atrophy. Mixed type intraductal papillary mucinous neoplasm remains in the differential. Ductal dilation from potential neoplasm as outlined above is the current diagnosis of exclusion. CT pancreatic protocol could also be considered for improved spatial resolution and may be helpful to guide further management. 3. Sludge in the gallbladder. No signs of biliary duct dilation.   MRI L-spine 07/16/20: IMPRESSION: - Lumbar spondylosis, as outlined and having progressed at several levels since the prior MRI of 06/12/2003. Findings are most notably as follows. - At L3-L4, there is progressive mild left greater than right subarticular narrowing with slight crowding of the descending left L4 nerve root. Progressive mild central canal narrowing. Progressive mild bilateral neural foraminal narrowing. - At L4-L5, redemonstrated sequela of prior right laminotomy. Unchanged multifactorial mild/moderate left subarticular narrowing with crowding of the descending left L5 nerve root. Progressive bilateral neural foraminal narrowing (mild/moderate right, mild left). - At L5-S1, there is progressive mild/moderate right subarticular narrowing with crowding of the descending right S1 nerve root. Mild right neural foraminal narrowing is unchanged. - Progressive multilevel disc degeneration. Most notably, there is progressive advanced disc degeneration at L4-L5. 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   EKG: 07/05/2020: NSR.  RBBB.   CV: Carotid US 08/07/17: Final Interpretation:  - Right Carotid: Velocities in the right ICA  are consistent with a 1-39%  stenosis. Non-hemodynamically significant plaque <50% noted in the  CCA. The RICA velocities remain within normal range and essentially  stable compared to the prior exam.  - Left Carotid: Velocities in the left ICA are consistent with a 1-39%  stenosis. Non-hemodynamically significant plaque noted in the CCA. The  LICA velocities remain within normal range and have decreased  compared to the prior exam.  - Vertebrals:  Bilateral vertebral arteries demonstrate antegrade flow.  - Subclavians: Normal flow hemodynamics were seen in bilateral subclavian  arteries.    Nuclear stress test 07/16/15: Nuclear stress EF: 83%. Blood pressure demonstrated a hypertensive response to exercise. Horizontal ST segment depression ST segment depression was noted during stress in the II, III, aVF, V4, V5 and V6 leads. This is a low risk study. The left ventricular ejection fraction is hyperdynamic (>65%).   Poor exercise tolerance achieving less than 5 METS with HTN respnse Positive ECG Perfusion images with breast attenuation no ischemia or infarct EF 83%   Echo 12/03/12: Study Conclusions  - Left ventricle: The cavity size was normal. Wall thickness    was increased in a pattern of moderate LVH. Systolic    function was normal. The estimated ejection fraction was    in the range of 60% to 65%.  - Atrial septum: No defect or patent foramen ovale was    identified.  - Pericardium,  extracardiac: A trivial pericardial effusion    was identified.  Past Medical History:  Diagnosis Date   Anemia    history only at age 2 yrs olds, no current problems   Aortic atherosclerosis ([place])    Arthritis    oa   Blood transfusion 1948   Blood transfusion without reported diagnosis    Breast cancer ([place]) 1994   bilateral / HX skin cancer   Carotid atherosclerosis    Cataract    both eyes   Constipation    COPD (chronic obstructive pulmonary disease) (HCC)    no meds/no inaler    Depression    Diabetes ([place])    type 2 - diet controlled, no meds   Disc disease, degenerative, cervical    trouble turing head and neck at times   Diverticulosis    DVT (deep venous thrombosis) ([place]) 2002   left leg   Dyspnea    occasional with exertion   Dysrhythmia    Family history of adverse reaction to anesthesia    Fibromyalgia    GAD (generalized anxiety disorder)    GERD (gastroesophageal reflux disease)    History of vertigo    Hx of skin cancer, basal cell    Hyperlipidemia    Hypertension    Measles as child   Mumps age 61   Osteoarthritis    Osteoporosis    Pancreatic cyst    Pancreatitis    PONV (postoperative nausea and vomiting)    PONV   Pulmonary nodule    Right renal mass    Seborrheic keratosis    Sinus tachycardia    Sinus tachycardia    Tubular adenoma of colon     Past Surgical History:  Procedure Laterality Date   ABDOMINAL HYSTERECTOMY     achilles tendon adn 2 bone spurs removed Left 05-2012   APPENDECTOMY     BACK SURGERY  1976   lower   BASAL CELL CARCINOMA EXCISION     bilateral mastectomy with tramflap reconstruction  1994   BIOPSY  05/13/2020   Procedure: BIOPSY;  Surgeon: Irving Copas., MD;  Location: [facility];  Service: Gastroenterology;;   BREAST REDUCTION SURGERY     CARPAL TUNNEL RELEASE Right 03/2017   COLONOSCOPY     ESOPHAGOGASTRODUODENOSCOPY (EGD) WITH PROPOFOL N/A 05/13/2020   Procedure: ESOPHAGOGASTRODUODENOSCOPY (EGD) WITH PROPOFOL;  Surgeon: Irving Copas., MD;  [facility] END[facility]COPY;  Service: Gastroenterology;  Laterality: N/A;   EUS N/A 05/13/2020   Procedure: UPPER ENDOSCOPIC ULTRASOUND (EUS) RADIAL;  Surgeon: Irving Copas., MD;  Location: [facility];  Service: Gastroenterology;  Laterality: N/A;   FOOT SURGERY     KNEE ARTHROSCOPY Right 2008   KNEE SURGERY  2002   arthroscopy-bilateral   MASTECTOMY     bilateral   PARTIAL HYSTERECTOMY     POLYPECTOMY     SHOULDER SURGERY  Left    TONSILLECTOMY     TOTAL KNEE ARTHROPLASTY Left 10/14/2012   Procedure: LEFT TOTAL KNEE ARTHROPLASTY;  Surgeon: Gearlean Alf, MD;  Location: [facility];  Service: Orthopedics;  Laterality: Left;   TOTAL KNEE ARTHROPLASTY Right 10/20/2013   Procedure: RIGHT TOTAL KNEE ARTHROPLASTY;  Surgeon: Gearlean Alf, MD;  Location: [facility];  Service: Orthopedics;  Laterality: Right;    MEDICATIONS:  aspirin EC 81 MG tablet   Calcium Carbonate-Vit D-Min (CALCIUM 1200 PO)   cyclobenzaprine (FLEXERIL) 10 MG tablet   denosumab (PROLIA) 60 MG/ML SOSY injection   esomeprazole (NEXIUM) 40 MG capsule  Evolocumab (REPATHA) 140 MG/ML SOSY   metoprolol tartrate (LOPRESSOR) 50 MG tablet   Multiple Vitamin (MULTIVITAMIN) tablet   polyethylene glycol (MIRALAX / GLYCOLAX) 17 g packet   promethazine (PHENERGAN) 12.5 MG tablet   scopolamine (TRANSDERM-SCOP) 1 MG/3DAYS   VITAMIN D PO    bupivacaine liposome (EXPAREL) 1.3 % injection 133 mg   ceFAZolin (ANCEF) IVPB 2g/100 mL premix   dexamethasone (DECADRON) injection 8 mg   lactated ringers infusion   povidone-iodine (BETADINE) 7.5 % scrub   povidone-iodine 10 % swab 2 application   povidone-iodine 10 % swab 2 application   tranexamic acid (CYKLOKAPRON) IVPB 1,000 mg    Myra Gianotti, PA-C Surgical Short Stay/Anesthesiology [facility] Phone [phone] [facility] Phone [phone] 08/27/2020 5:34 PM

## 2020-08-29 ENCOUNTER — Encounter (HOSPITAL_COMMUNITY): Payer: Self-pay | Admitting: Surgery

## 2020-08-29 NOTE — Anesthesia Preprocedure Evaluation (Addendum)
Anesthesia Evaluation  Patient identified by MRN, date of birth, ID band Patient awake    Reviewed: Allergy & Precautions, NPO status , Patient's Chart, lab work & pertinent test results  History of Anesthesia Complications (+) PONV and history of anesthetic complications  Airway Mallampati: III  TM Distance: <3 FB Neck ROM: Full  Mouth opening: Limited Mouth Opening  Dental no notable dental hx.    Pulmonary COPD, former smoker,    Pulmonary exam normal breath sounds clear to auscultation       Cardiovascular Exercise Tolerance: Good hypertension, Normal cardiovascular exam+ dysrhythmias (RBBB)  Rhythm:Regular Rate:Normal     Neuro/Psych PSYCHIATRIC DISORDERS Anxiety Depression    GI/Hepatic GERD  ,  Endo/Other  Type 2  Renal/GU      Musculoskeletal  (+) Arthritis , Fibromyalgia -  Abdominal   Peds  Hematology negative hematology ROS (+)   Anesthesia Other Findings   Reproductive/Obstetrics                            Anesthesia Physical Anesthesia Plan  ASA: 3  Anesthesia Plan: General   Post-op Pain Management:    Induction: Intravenous  PONV Risk Score and Plan: 4 or greater and Treatment may vary due to age or medical condition, Ondansetron, Dexamethasone and Propofol infusion  Airway Management Planned: Oral ETT and Video Laryngoscope Planned  Additional Equipment: Arterial line  Intra-op Plan:   Post-operative Plan: Extubation in OR  Informed Consent:   Plan Discussed with:   Anesthesia Plan Comments: (+ arterial line. GETA. Glidescope available. Norton Blizzard, MD  )       Anesthesia Quick Evaluation

## 2020-08-30 ENCOUNTER — Inpatient Hospital Stay (HOSPITAL_COMMUNITY)
Admission: RE | Admit: 2020-08-30 | Discharge: 2020-09-07 | DRG: 407 | Disposition: A | Discharge status: Home / Self Care | Payer: Medicare Other | Attending: Surgery | Admitting: Surgery

## 2020-08-30 ENCOUNTER — Inpatient Hospital Stay (HOSPITAL_COMMUNITY): Payer: Medicare Other | Admitting: Anesthesiology

## 2020-08-30 ENCOUNTER — Inpatient Hospital Stay (HOSPITAL_COMMUNITY): Payer: Medicare Other | Admitting: Vascular Surgery

## 2020-08-30 ENCOUNTER — Encounter (HOSPITAL_COMMUNITY)
Admission: RE | Disposition: A | Discharge status: Home / Self Care | Payer: Self-pay | Source: Home / Self Care | Attending: Surgery

## 2020-08-30 ENCOUNTER — Encounter (HOSPITAL_COMMUNITY): Payer: Self-pay | Admitting: Surgery

## 2020-08-30 ENCOUNTER — Other Ambulatory Visit: Payer: Self-pay

## 2020-08-30 DIAGNOSIS — Z20822 Contact with and (suspected) exposure to covid-19: Secondary | ICD-10-CM | POA: Diagnosis present

## 2020-08-30 DIAGNOSIS — K219 Gastro-esophageal reflux disease without esophagitis: Secondary | ICD-10-CM | POA: Diagnosis present

## 2020-08-30 DIAGNOSIS — R54 Age-related physical debility: Secondary | ICD-10-CM | POA: Diagnosis present

## 2020-08-30 DIAGNOSIS — J449 Chronic obstructive pulmonary disease, unspecified: Secondary | ICD-10-CM | POA: Diagnosis present

## 2020-08-30 DIAGNOSIS — E785 Hyperlipidemia, unspecified: Secondary | ICD-10-CM | POA: Diagnosis present

## 2020-08-30 DIAGNOSIS — K8689 Other specified diseases of pancreas: Principal | ICD-10-CM | POA: Diagnosis not present

## 2020-08-30 DIAGNOSIS — M797 Fibromyalgia: Secondary | ICD-10-CM | POA: Diagnosis present

## 2020-08-30 DIAGNOSIS — Z87891 Personal history of nicotine dependence: Secondary | ICD-10-CM

## 2020-08-30 DIAGNOSIS — K862 Cyst of pancreas: Secondary | ICD-10-CM | POA: Diagnosis present

## 2020-08-30 DIAGNOSIS — D490 Neoplasm of unspecified behavior of digestive system: Secondary | ICD-10-CM | POA: Diagnosis present

## 2020-08-30 HISTORY — PX: LAPAROSCOPIC LIVER ULTRASOUND: SHX5902

## 2020-08-30 HISTORY — PX: PANCREATECTOMY: SHX5261

## 2020-08-30 HISTORY — PX: LAPAROSCOPIC SPLENECTOMY: SHX409

## 2020-08-30 LAB — CBC
HCT: 31.5 % — ABNORMAL LOW (ref 36.0–46.0)
Hemoglobin: 10.6 g/dL — ABNORMAL LOW (ref 12.0–15.0)
MCH: 34.4 pg — ABNORMAL HIGH (ref 26.0–34.0)
MCHC: 33.7 g/dL (ref 30.0–36.0)
MCV: 102.3 fL — ABNORMAL HIGH (ref 80.0–100.0)
Platelets: 215 10*3/uL (ref 150–400)
RBC: 3.08 MIL/uL — ABNORMAL LOW (ref 3.87–5.11)
RDW: 11.9 % (ref 11.5–15.5)
WBC: 13.3 10*3/uL — ABNORMAL HIGH (ref 4.0–10.5)
nRBC: 0 % (ref 0.0–0.2)

## 2020-08-30 LAB — POCT I-STAT 7, (LYTES, BLD GAS, ICA,H+H)
Acid-base deficit: 2 mmol/L (ref 0.0–2.0)
Acid-base deficit: 2 mmol/L (ref 0.0–2.0)
Bicarbonate: 25.7 mmol/L (ref 20.0–28.0)
Bicarbonate: 23.6 mmol/L (ref 20.0–28.0)
Calcium, Ion: 1.16 mmol/L (ref 1.15–1.40)
Calcium, Ion: 1.12 mmol/L — ABNORMAL LOW (ref 1.15–1.40)
HCT: 37 % (ref 36.0–46.0)
HCT: 35 % — ABNORMAL LOW (ref 36.0–46.0)
Hemoglobin: 12.6 g/dL (ref 12.0–15.0)
Hemoglobin: 11.9 g/dL — ABNORMAL LOW (ref 12.0–15.0)
O2 Saturation: 100 %
O2 Saturation: 100 %
Potassium: 4.1 mmol/L (ref 3.5–5.1)
Potassium: 4.1 mmol/L (ref 3.5–5.1)
Sodium: 137 mmol/L (ref 135–145)
Sodium: 136 mmol/L (ref 135–145)
TCO2: 27 mmol/L (ref 22–32)
TCO2: 25 mmol/L (ref 22–32)
pCO2 arterial: 53.2 mmHg — ABNORMAL HIGH (ref 32.0–48.0)
pCO2 arterial: 44.5 mmHg (ref 32.0–48.0)
pH, Arterial: 7.292 — ABNORMAL LOW (ref 7.350–7.450)
pH, Arterial: 7.333 — ABNORMAL LOW (ref 7.350–7.450)
pO2, Arterial: 218 mmHg — ABNORMAL HIGH (ref 83.0–108.0)
pO2, Arterial: 235 mmHg — ABNORMAL HIGH (ref 83.0–108.0)

## 2020-08-30 LAB — GLUCOSE, CAPILLARY
Glucose-Capillary: 138 mg/dL — ABNORMAL HIGH (ref 70–99)
Glucose-Capillary: 187 mg/dL — ABNORMAL HIGH (ref 70–99)
Glucose-Capillary: 189 mg/dL — ABNORMAL HIGH (ref 70–99)
Glucose-Capillary: 200 mg/dL — ABNORMAL HIGH (ref 70–99)

## 2020-08-30 LAB — BASIC METABOLIC PANEL
Anion gap: 7 (ref 5–15)
Anion gap: 10 (ref 5–15)
BUN: 9 mg/dL (ref 8–23)
BUN: 7 mg/dL — ABNORMAL LOW (ref 8–23)
CO2: 28 mmol/L (ref 22–32)
CO2: 23 mmol/L (ref 22–32)
Calcium: 9 mg/dL (ref 8.9–10.3)
Calcium: 7.7 mg/dL — ABNORMAL LOW (ref 8.9–10.3)
Chloride: 103 mmol/L (ref 98–111)
Chloride: 104 mmol/L (ref 98–111)
Creatinine, Ser: 0.66 mg/dL (ref 0.44–1.00)
Creatinine, Ser: 0.57 mg/dL (ref 0.44–1.00)
GFR, Estimated: >60 mL/min (ref >60)
GFR, Estimated: >60 mL/min (ref >60)
Glucose, Bld: 146 mg/dL — ABNORMAL HIGH (ref 70–99)
Glucose, Bld: 218 mg/dL — ABNORMAL HIGH (ref 70–99)
Potassium: 4.8 mmol/L (ref 3.5–5.1)
Potassium: 3.3 mmol/L — ABNORMAL LOW (ref 3.5–5.1)
Sodium: 138 mmol/L (ref 135–145)
Sodium: 137 mmol/L (ref 135–145)

## 2020-08-30 SURGERY — PANCREATECTOMY, LAPAROSCOPIC
Anesthesia: General | Site: Abdomen

## 2020-08-30 MED ORDER — PROPOFOL 10 MG/ML IV BOLUS
INTRAVENOUS | Status: DC | PRN
  Administered 2020-08-30: 120 mg via INTRAVENOUS

## 2020-08-30 MED ORDER — ORAL CARE MOUTH RINSE: 15.0000 mL | Freq: Once | OROMUCOSAL | Status: AC

## 2020-08-30 MED ORDER — FENTANYL CITRATE (PF) 100 MCG/2ML IJ SOLN
25.0000 ug | INTRAMUSCULAR | Status: DC | PRN
  Administered 2020-08-30 (×2): 50 ug via INTRAVENOUS

## 2020-08-30 MED ORDER — CHLORHEXIDINE GLUCONATE 0.12 % MT SOLN
15.0000 mL | Freq: Once | OROMUCOSAL | Status: AC
  Administered 2020-08-30: 15 mL via OROMUCOSAL
  Filled 2020-08-30: qty 15

## 2020-08-30 MED ORDER — HYDROMORPHONE HCL 1 MG/ML IJ SOLN
0.5000 mg | INTRAMUSCULAR | Status: DC | PRN
Start: 2020-08-30 — End: 2020-08-31
  Administered 2020-08-30 – 2020-08-31 (×3): 0.5 mg via INTRAVENOUS
  Filled 2020-08-30 (×3): qty 1

## 2020-08-30 MED ORDER — HYDROMORPHONE HCL 1 MG/ML IJ SOLN
INTRAMUSCULAR | Status: DC | PRN
  Administered 2020-08-30 (×2): .5 mg via INTRAVENOUS

## 2020-08-30 MED ORDER — LACTATED RINGERS IV SOLN: INTRAVENOUS | Status: DC

## 2020-08-30 MED ORDER — ASPIRIN EC 81 MG PO TBEC
81.0000 mg | DELAYED_RELEASE_TABLET | Freq: Every day | ORAL | Status: DC
  Administered 2020-08-31 – 2020-09-07 (×8): 81 mg via ORAL
  Filled 2020-08-30 (×8): qty 1

## 2020-08-30 MED ORDER — HEMOSTATIC AGENTS (NO CHARGE) OPTIME
TOPICAL | Status: DC | PRN
  Administered 2020-08-30: 1 via TOPICAL

## 2020-08-30 MED ORDER — ALBUMIN HUMAN 5 % IV SOLN: INTRAVENOUS | Status: DC | PRN

## 2020-08-30 MED ORDER — PHENYLEPHRINE 40 MCG/ML (10ML) SYRINGE FOR IV PUSH (FOR BLOOD PRESSURE SUPPORT)
PREFILLED_SYRINGE | INTRAVENOUS | Status: DC | PRN
  Administered 2020-08-30: 80 ug via INTRAVENOUS
  Administered 2020-08-30: 120 ug via INTRAVENOUS
  Administered 2020-08-30: 80 ug via INTRAVENOUS
  Administered 2020-08-30 (×2): 120 ug via INTRAVENOUS
  Administered 2020-08-30: 80 ug via INTRAVENOUS
  Administered 2020-08-30: 120 ug via INTRAVENOUS

## 2020-08-30 MED ORDER — PROMETHAZINE HCL 25 MG/ML IJ SOLN
INTRAMUSCULAR | Status: AC
  Filled 2020-08-30: qty 1

## 2020-08-30 MED ORDER — LACTATED RINGERS IV SOLN: INTRAVENOUS | Status: DC | PRN

## 2020-08-30 MED ORDER — CEFAZOLIN SODIUM-DEXTROSE 2-4 GM/100ML-% IV SOLN
2.0000 g | INTRAVENOUS | Status: AC
  Administered 2020-08-30: 2 g via INTRAVENOUS
  Filled 2020-08-30: qty 100

## 2020-08-30 MED ORDER — PHENYLEPHRINE HCL-NACL 10-0.9 MG/250ML-% IV SOLN
INTRAVENOUS | Status: DC | PRN
  Administered 2020-08-30: 20 ug/min via INTRAVENOUS

## 2020-08-30 MED ORDER — LIDOCAINE 2% (20 MG/ML) 5 ML SYRINGE
INTRAMUSCULAR | Status: DC | PRN
  Administered 2020-08-30: 60 mg via INTRAVENOUS

## 2020-08-30 MED ORDER — ONDANSETRON HCL 4 MG/2ML IJ SOLN
INTRAMUSCULAR | Status: DC | PRN
  Administered 2020-08-30: 4 mg via INTRAVENOUS

## 2020-08-30 MED ORDER — CHLORHEXIDINE GLUCONATE CLOTH 2 % EX PADS
6.0000 | MEDICATED_PAD | Freq: Every day | CUTANEOUS | Status: DC
  Administered 2020-08-31 – 2020-09-07 (×5): 6 via TOPICAL

## 2020-08-30 MED ORDER — HYDROMORPHONE HCL 1 MG/ML IJ SOLN
INTRAMUSCULAR | Status: AC
  Filled 2020-08-30: qty 0.5

## 2020-08-30 MED ORDER — ROCURONIUM BROMIDE 10 MG/ML (PF) SYRINGE
PREFILLED_SYRINGE | INTRAVENOUS | Status: AC
  Filled 2020-08-30: qty 10

## 2020-08-30 MED ORDER — DEXAMETHASONE SODIUM PHOSPHATE 10 MG/ML IJ SOLN
INTRAMUSCULAR | Status: AC
  Filled 2020-08-30: qty 1

## 2020-08-30 MED ORDER — DEXAMETHASONE SODIUM PHOSPHATE 10 MG/ML IJ SOLN
INTRAMUSCULAR | Status: DC | PRN
  Administered 2020-08-30: 10 mg via INTRAVENOUS

## 2020-08-30 MED ORDER — METHOCARBAMOL 1000 MG/10ML IJ SOLN
500.0000 mg | Freq: Three times a day (TID) | INTRAVENOUS | Status: DC | PRN
  Filled 2020-08-30: qty 5

## 2020-08-30 MED ORDER — LIDOCAINE 2% (20 MG/ML) 5 ML SYRINGE
INTRAMUSCULAR | Status: AC
  Filled 2020-08-30: qty 5

## 2020-08-30 MED ORDER — ROCURONIUM BROMIDE 100 MG/10ML IV SOLN
INTRAVENOUS | Status: DC | PRN
  Administered 2020-08-30: 20 mg via INTRAVENOUS
  Administered 2020-08-30: 100 mg via INTRAVENOUS

## 2020-08-30 MED ORDER — AMISULPRIDE (ANTIEMETIC) 5 MG/2ML IV SOLN
10.0000 mg | Freq: Once | INTRAVENOUS | Status: AC | PRN
  Administered 2020-08-30: 10 mg via INTRAVENOUS

## 2020-08-30 MED ORDER — INSULIN ASPART 100 UNIT/ML IJ SOLN
0.0000 [IU] | INTRAMUSCULAR | Status: DC
  Administered 2020-08-30: 2 [IU] via SUBCUTANEOUS
  Administered 2020-08-30: 3 [IU] via SUBCUTANEOUS
  Administered 2020-08-31 – 2020-09-01 (×3): 2 [IU] via SUBCUTANEOUS

## 2020-08-30 MED ORDER — BUPIVACAINE-EPINEPHRINE (PF) 0.25% -1:200000 IJ SOLN
INTRAMUSCULAR | Status: AC
  Filled 2020-08-30: qty 30

## 2020-08-30 MED ORDER — FENTANYL CITRATE (PF) 250 MCG/5ML IJ SOLN
INTRAMUSCULAR | Status: AC
  Filled 2020-08-30: qty 5

## 2020-08-30 MED ORDER — METOPROLOL TARTRATE 5 MG/5ML IV SOLN
5.0000 mg | Freq: Three times a day (TID) | INTRAVENOUS | Status: DC
  Administered 2020-08-30 – 2020-08-31 (×3): 5 mg via INTRAVENOUS
  Filled 2020-08-30 (×3): qty 5

## 2020-08-30 MED ORDER — PROPOFOL 10 MG/ML IV BOLUS
INTRAVENOUS | Status: AC
  Filled 2020-08-30: qty 20

## 2020-08-30 MED ORDER — FENTANYL CITRATE (PF) 100 MCG/2ML IJ SOLN
INTRAMUSCULAR | Status: AC
  Filled 2020-08-30: qty 2

## 2020-08-30 MED ORDER — PROMETHAZINE HCL 25 MG/ML IJ SOLN
6.2500 mg | INTRAMUSCULAR | Status: DC | PRN
  Administered 2020-08-30: 12.5 mg via INTRAVENOUS

## 2020-08-30 MED ORDER — EPHEDRINE SULFATE-NACL 50-0.9 MG/10ML-% IV SOSY
PREFILLED_SYRINGE | INTRAVENOUS | Status: DC | PRN
  Administered 2020-08-30: 10 mg via INTRAVENOUS

## 2020-08-30 MED ORDER — ENOXAPARIN SODIUM 40 MG/0.4ML IJ SOSY
40.0000 mg | PREFILLED_SYRINGE | INTRAMUSCULAR | Status: DC
  Administered 2020-08-31 – 2020-09-07 (×8): 40 mg via SUBCUTANEOUS
  Filled 2020-08-30 (×8): qty 0.4

## 2020-08-30 MED ORDER — HYDRALAZINE HCL 20 MG/ML IJ SOLN
INTRAMUSCULAR | Status: DC | PRN
  Administered 2020-08-30: 10 mg via INTRAVENOUS

## 2020-08-30 MED ORDER — ACETAMINOPHEN 500 MG PO TABS
1000.0000 mg | ORAL_TABLET | Freq: Once | ORAL | Status: AC
  Administered 2020-08-30: 1000 mg via ORAL
  Filled 2020-08-30: qty 2

## 2020-08-30 MED ORDER — POTASSIUM CHLORIDE 10 MEQ/100ML IV SOLN
10.0000 meq | INTRAVENOUS | Status: AC
Start: 2020-08-30 — End: 2020-08-30
  Administered 2020-08-30 (×3): 10 meq via INTRAVENOUS
  Filled 2020-08-30 (×3): qty 100

## 2020-08-30 MED ORDER — BUPIVACAINE-EPINEPHRINE 0.25% -1:200000 IJ SOLN
INTRAMUSCULAR | Status: DC | PRN
Start: 2020-08-30 — End: 2020-08-30
  Administered 2020-08-30 (×2): 15 mL

## 2020-08-30 MED ORDER — AMISULPRIDE (ANTIEMETIC) 5 MG/2ML IV SOLN
INTRAVENOUS | Status: AC
  Filled 2020-08-30: qty 4

## 2020-08-30 MED ORDER — PHENYLEPHRINE 40 MCG/ML (10ML) SYRINGE FOR IV PUSH (FOR BLOOD PRESSURE SUPPORT)
PREFILLED_SYRINGE | INTRAVENOUS | Status: AC
  Filled 2020-08-30: qty 10

## 2020-08-30 MED ORDER — SUGAMMADEX SODIUM 200 MG/2ML IV SOLN
INTRAVENOUS | Status: DC | PRN
  Administered 2020-08-30: 200 mg via INTRAVENOUS

## 2020-08-30 MED ORDER — FENTANYL CITRATE (PF) 250 MCG/5ML IJ SOLN
INTRAMUSCULAR | Status: DC | PRN
  Administered 2020-08-30 (×6): 50 ug via INTRAVENOUS

## 2020-08-30 MED ORDER — ONDANSETRON HCL 4 MG/2ML IJ SOLN
INTRAMUSCULAR | Status: AC
  Filled 2020-08-30: qty 2

## 2020-08-30 MED ORDER — PANTOPRAZOLE SODIUM 40 MG PO TBEC
40.0000 mg | DELAYED_RELEASE_TABLET | Freq: Every day | ORAL | Status: DC
  Administered 2020-08-31 – 2020-09-07 (×8): 40 mg via ORAL
  Filled 2020-08-30 (×8): qty 1

## 2020-08-30 MED ORDER — 0.9 % SODIUM CHLORIDE (POUR BTL) OPTIME
TOPICAL | Status: DC | PRN
  Administered 2020-08-30 (×2): 1000 mL

## 2020-08-30 MED ORDER — METRONIDAZOLE 500 MG/100ML IV SOLN
500.0000 mg | INTRAVENOUS | Status: AC
  Administered 2020-08-30: 500 mg via INTRAVENOUS
  Filled 2020-08-30: qty 100

## 2020-08-30 MED ORDER — HYDRALAZINE HCL 20 MG/ML IJ SOLN
INTRAMUSCULAR | Status: AC
  Filled 2020-08-30: qty 1

## 2020-08-30 SURGICAL SUPPLY — 79 items
ADH SKN CLS APL DERMABOND .7 (GAUZE/BANDAGES/DRESSINGS) ×2
APL PRP STRL LF DISP 70% ISPRP (MISCELLANEOUS) ×2
APL SRG 38 LTWT LNG FL B (MISCELLANEOUS) ×2
APPLICATOR ARISTA FLEXITIP XL (MISCELLANEOUS) ×2 IMPLANT
BAG COUNTER SPONGE SURGICOUNT (BAG) ×4 IMPLANT
BAG SPNG CNTER NS LX DISP (BAG) ×2
BIOPATCH RED 1 DISK 7.0 (GAUZE/BANDAGES/DRESSINGS) ×3 IMPLANT
BLADE CLIPPER SURG (BLADE) ×2 IMPLANT
CANISTER SUCT 3000ML PPV (MISCELLANEOUS) ×3 IMPLANT
CHLORAPREP W/TINT 26 (MISCELLANEOUS) ×4 IMPLANT
CLIP VESOLOCK MED LG 6/CT (CLIP) ×2 IMPLANT
CLIP VESOLOCK XL 6/CT (CLIP) ×4 IMPLANT
COVER SURGICAL LIGHT HANDLE (MISCELLANEOUS) ×4 IMPLANT
DEFOGGER SCOPE WARMER CLEARIFY (MISCELLANEOUS) ×2 IMPLANT
DERMABOND ADVANCED (GAUZE/BANDAGES/DRESSINGS) ×1
DERMABOND ADVANCED .7 DNX12 (GAUZE/BANDAGES/DRESSINGS) ×2 IMPLANT
DRAIN CHANNEL 19F RND (DRAIN) ×3 IMPLANT
DRAPE INCISE IOBAN 66X45 STRL (DRAPES) ×1 IMPLANT
DRAPE LAPAROSCOPIC ABDOMINAL (DRAPES) ×1 IMPLANT
DRAPE WARM FLUID 44X44 (DRAPES) ×4 IMPLANT
DRSG TEGADERM 4X4.75 (GAUZE/BANDAGES/DRESSINGS) ×2 IMPLANT
ELECT BLADE 4.0 EZ CLEAN MEGAD (MISCELLANEOUS) ×3
ELECT BLADE 6.5 EXT (BLADE) ×3 IMPLANT
ELECT REM PT RETURN 9FT ADLT (ELECTROSURGICAL) ×3
ELECTRODE BLDE 4.0 EZ CLN MEGD (MISCELLANEOUS) ×1 IMPLANT
ELECTRODE REM PT RTRN 9FT ADLT (ELECTROSURGICAL) ×3 IMPLANT
EVACUATOR SILICONE 100CC (DRAIN) ×2 IMPLANT
GLOVE SURG POLY MICRO LF SZ5.5 (GLOVE) ×4 IMPLANT
GLOVE SURG UNDER POLY LF SZ6 (GLOVE) ×4 IMPLANT
GOWN STRL REUS W/ TWL LRG LVL3 (GOWN DISPOSABLE) ×8 IMPLANT
GOWN STRL REUS W/ TWL XL LVL3 (GOWN DISPOSABLE) ×2 IMPLANT
GOWN STRL REUS W/TWL LRG LVL3 (GOWN DISPOSABLE) ×9
GOWN STRL REUS W/TWL XL LVL3 (GOWN DISPOSABLE) ×3
HANDLE SUCTION POOLE (INSTRUMENTS) ×1 IMPLANT
HEMOSTAT ARISTA ABSORB 3G PWDR (HEMOSTASIS) ×2 IMPLANT
KIT BASIN OR (CUSTOM PROCEDURE TRAY) ×4 IMPLANT
KIT TURNOVER KIT B (KITS) ×4 IMPLANT
NS IRRIG 1000ML POUR BTL (IV SOLUTION) ×7 IMPLANT
PAD ARMBOARD 7.5X6 YLW CONV (MISCELLANEOUS) ×7 IMPLANT
PENCIL SMOKE EVACUATOR (MISCELLANEOUS) ×3 IMPLANT
RELOAD STAPLE 60 2.6 WHT THN (STAPLE) IMPLANT
RELOAD STAPLE 60 4.1 GRN THCK (STAPLE) IMPLANT
RELOAD STAPLER GREEN 60MM (STAPLE) ×4 IMPLANT
RELOAD STAPLER WHITE 60MM (STAPLE) ×6 IMPLANT
SCISSORS LAP 5X35 DISP (ENDOMECHANICALS) ×2 IMPLANT
SET IRRIG TUBING LAPAROSCOPIC (IRRIGATION / IRRIGATOR) ×2 IMPLANT
SET TUBE SMOKE EVAC HIGH FLOW (TUBING) ×3 IMPLANT
SLEEVE ENDOPATH XCEL 5M (ENDOMECHANICALS) ×5 IMPLANT
SPECIMEN JAR X LARGE (MISCELLANEOUS) ×3 IMPLANT
SPONGE INTESTINAL PEANUT (DISPOSABLE) IMPLANT
SPONGE T-LAP 18X18 ~~LOC~~+RFID (SPONGE) ×4 IMPLANT
STAPLE ECHEON FLEX 60 POW ENDO (STAPLE) ×3 IMPLANT
STAPLE LINE REINFORCEMENT LAP (STAPLE) ×4 IMPLANT
STAPLER RELOAD GREEN 60MM (STAPLE) ×6
STAPLER RELOAD WHITE 60MM (STAPLE) ×9
STAPLER VISISTAT 35W (STAPLE) ×1 IMPLANT
SUCTION POOLE HANDLE (INSTRUMENTS)
SUT ETHILON 2 0 FS 18 (SUTURE) ×2 IMPLANT
SUT MNCRL AB 4-0 PS2 18 (SUTURE) ×4 IMPLANT
SUT NOVA NAB DX-16 0-1 5-0 T12 (SUTURE) ×2 IMPLANT
SUT PDS AB 1 TP1 96 (SUTURE) ×2 IMPLANT
SUT SILK 2 0 PERMA HAND 18 BK (SUTURE) ×4 IMPLANT
SUT SILK 2 0 TIES 10X30 (SUTURE) ×3 IMPLANT
SUT SILK 2 0SH CR/8 30 (SUTURE) ×1 IMPLANT
SUT SILK 3 0SH CR/8 30 (SUTURE) ×1 IMPLANT
SUT VIC AB 3-0 SH 18 (SUTURE) ×1 IMPLANT
SUT VIC AB 3-0 SH 27 (SUTURE) ×3
SUT VIC AB 3-0 SH 27X BRD (SUTURE) ×1 IMPLANT
SUT VICRYL 0 AB UR-6 (SUTURE) ×2 IMPLANT
SYS LAPSCP GELPORT 120MM (MISCELLANEOUS) ×3
SYSTEM LAPSCP GELPORT 120MM (MISCELLANEOUS) ×1 IMPLANT
TOWEL GREEN STERILE (TOWEL DISPOSABLE) ×4 IMPLANT
TOWEL GREEN STERILE FF (TOWEL DISPOSABLE) ×4 IMPLANT
TRAY FOLEY MTR SLVR 14FR STAT (SET/KITS/TRAYS/PACK) ×3 IMPLANT
TRAY LAPAROSCOPIC MC (CUSTOM PROCEDURE TRAY) ×3 IMPLANT
TROCAR XCEL 12X100 BLDLESS (ENDOMECHANICALS) ×2 IMPLANT
TROCAR XCEL BLUNT TIP 100MML (ENDOMECHANICALS) ×2 IMPLANT
TROCAR XCEL NON-BLD 5MMX100MML (ENDOMECHANICALS) ×3 IMPLANT
WARMER LAPAROSCOPE (MISCELLANEOUS) ×1 IMPLANT

## 2020-08-30 NOTE — Anesthesia Postprocedure Evaluation (Signed)
Anesthesia Post Note  Patient: Renee [name]  Procedure(s) Performed: LAPAROSCOPIC DISTAL PANCREATECTOMY (Abdomen) LAPAROSCOPIC SPLENECTOMY (Abdomen) LAPAROSCOPIC INTRAOPERATIVE ULTRASOUND (Abdomen)     Patient location during evaluation: PACU Anesthesia Type: General Level of consciousness: awake Pain management: pain level controlled Vital Signs Assessment: post-procedure vital signs reviewed and stable Respiratory status: spontaneous breathing and respiratory function stable Cardiovascular status: stable Postop Assessment: no apparent nausea or vomiting Anesthetic complications: no   No notable events documented.  Last Vitals:  Vitals:   08/30/20 1300 08/30/20 1313  BP: (!) 113/57   Pulse: 91 94  Resp: (!) 21 15  Temp:    SpO2: 92% 91%    Last Pain:  Vitals:   08/30/20 1300  TempSrc:   PainSc: Asleep                 Merlinda Frederick

## 2020-08-30 NOTE — Anesthesia Procedure Notes (Addendum)
Procedure Name: Intubation Date/Time: 08/30/2020 7:44 AM Performed by: Donnelly Angelica, RN Pre-anesthesia Checklist: Patient identified, Emergency Drugs available, Suction available and Patient being monitored Patient Re-evaluated:Patient Re-evaluated prior to induction Oxygen Delivery Method: Circle system utilized Preoxygenation: Pre-oxygenation with 100% oxygen Induction Type: IV induction Ventilation: Mask ventilation without difficulty and Oral airway inserted - appropriate to patient size Laryngoscope Size: Glidescope and 4 Grade View: Grade I Tube type: Oral Number of attempts: 1 Airway Equipment and Method: Stylet and Oral airway Placement Confirmation: ETT inserted through vocal cords under direct vision, positive ETCO2 and breath sounds checked- equal and bilateral Secured at: 21 cm Tube secured with: Tape Dental Injury: Teeth and Oropharynx as per pre-operative assessment  Difficulty Due To: Difficulty was anticipated and Difficult Airway- due to limited oral opening Comments: Prominent underbite with limited mouth opening. Video laryngoscopy recommended for future intubations.Intubation by S. Lovena Le, [place]

## 2020-08-30 NOTE — Progress Notes (Signed)
BMP redrawn at this time

## 2020-08-30 NOTE — Plan of Care (Signed)
Received patient from PACU to 4 N P. The patient is A & O x 4. Medicated her with Dilaudid 0.5 mg IV per her request for abdominal pain. No acute distress noted. Will continue to monitor.

## 2020-08-30 NOTE — Progress Notes (Signed)
Patient did voice her name and birthdate and was able to state her sister Letta Median is here with her

## 2020-08-30 NOTE — Progress Notes (Signed)
Patient does answer yes no question by nodding her head she is alert to person and knows where she is at

## 2020-08-30 NOTE — Progress Notes (Signed)
A line discontinued at this time patient tolerated procedure pressure applied to site once removed and pressure dressing in place

## 2020-08-30 NOTE — Op Note (Signed)
Date: 08/30/20  Patient: Renee [name] MRN: [id number]  Preoperative Diagnosis: Pancreatic IPMN Postoperative Diagnosis: Same  Procedure: Laparoscopic distal pancreatectomy and splenectomy, with intraoperative ultrasound  Surgeon: Michaelle Birks, MD Assistant: Stark Klein, MD  EBL: 300 mL  Anesthesia: General endotracheal  Specimens: Distal pancreas and spleen  Indications: Ms. Casas is an 80 yo female who was referred with a cyst in the tail of the pancreas which has been present for several years. She had an episode of acute pancreatitis involving the tail of the pancreas several years ago. The cyst has enlarged over the last year and is associated with an abrupt change in caliber of the main pancreatic duct, suspicious for an IPMN. EUS with FNA did not show any malignant cells, but given the concerning imaging findings and mildly elevated CA19-9, surgical resection was recommended.  Findings: Cyst in the body/tail of the pancreas containing mucinous fluid, consistent with an IPMN. Pancreatic resection margin was negative for malignant cells and high-grade dysplasia on intraoperative frozen section.  Procedure details: Informed consent was obtained in the preoperative area prior to the procedure. The patient was brought to the operating room and placed on the table in the supine position. General anesthesia was induced and appropriate lines and drains were placed for intraoperative monitoring. Perioperative antibiotics were administered per SCIP guidelines. The abdomen was prepped and draped in the usual sterile fashion. A pre-procedure timeout was taken verifying patient identity, surgical site and procedure to be performed.  A small infraumbilical skin incision was made, the subcutaneous tissue was divided with cautery, and the umbilical stalk was grasped and elevated. The fascia was incised and the peritoneal cavity was visualized. A 61mm Hassan trocar was inserted the abdomen was  insufflated. An additional 41mm port was placed in the LUQ under direct visualization, and 68mm ports were placed in the LUQ and RUQ under direct visualization. The peritoneal cavity was inspected with no signs of visceral or vascular injury. The liver was normal and healthy in appearance. There were adhesions to the lower abdominal wall below inferior to the umbilicus but the upper abdomen was free of adhesions.  The gastrocolic omentum was opened along the greater curve of the stomach using Harmonic shears, and the lesser sac was entered. A cyst was visible on the anterosuperior surface of the pancreas, and this was confirmed with intraoperative ultrasound (see representative images below). Division of the gastrocolic ligament was carried cephalad toward the fundus of the stomach. The short gastric vessels were divided with the Harmonic. A Nathanson retractor was then placed to lift the stomach and left lobe of the liver off the pancreas. The inferior border of the pancreas was dissected out using the Harmonic. A plane was then created between the posterior surface of the pancreas and the retroperitoneum using gentle blunt dissection. The splenic vein was visualized and circumferentially dissected out, and clipped with 2 hemalock clips and stapled with an Echelon 10mm white load stapler. The splenic artery was visualized along the superior border of the pancreas, proximal to the cyst. It was circumferentially dissected out and divided with an Echelon 1mm white load stapler. The body of the pancreas was then divided with a 76mm stapler with a green load with peristrips. The distal pancreas was then further mobilized off the retroperitoneum with the Harmonic. The splenic flexure of the colon was taken down. There were some adhesions between the spleen and the diaphragm, presumably secondary to the patient's previous acute pancreatitis, and these were taken down with the  Harmonic. The lienophrenic and lienorenal  ligaments were divided with the Harmonic. This completely freed the tail of the pancreas and the spleen. The umbilical port site was extended inferiorly to create an extraction site; this required partial division of the previously placed mesh on the lower abdominal wall. A wound protector was placed. The specimen was extracted and sent for pathology. Frozen analysis of the pancreatic body margin was negative for malignancy and high-grade dysplasia. A gel port was placed at the umbilicus and the abdomen was again insufflated. The surgical site was irrigated and appeared hemostatic. The pancreatic and vascular staple lines were inspected and were hemostatic. No bleeding was noted in the splenic fossa. Arista was placed in the splenic fossa and along the pancreatic remnant. A 19-Fr round JP drain was placed adjacent to the remnant pancreas and through the splenic fossa, and brought out through the 77mm port site in the LUQ. It was secured to the skin with 2-0 Nylon. The remaining ports were removed and the abdomen was desufflated. The fascia at the extraction site was closed with interrupted 1 Novafil sutures. The LUQ 54mm port site fascia was closed with 0 Vicryl. The skin at all port sites was closed with subcuticular 4-0 monocryl and dermabond was applied.  The patient tolerated the procedure with no apparent complications. All counts were correct x2 at the end of the procedure. The patient was extubated and taken to PACU in stable condition.  Michaelle Birks, MD 08/30/20 1:40 PM

## 2020-08-30 NOTE — Transfer of Care (Signed)
Immediate Anesthesia Transfer of Care Note  Patient: Renee [name]  Procedure(s) Performed: LAPAROSCOPIC DISTAL PANCREATECTOMY (Abdomen) LAPAROSCOPIC SPLENECTOMY (Abdomen) LAPAROSCOPIC INTRAOPERATIVE ULTRASOUND (Abdomen)  Patient Location: PACU  Anesthesia Type:General  Level of Consciousness: drowsy  Airway & Oxygen Therapy: Patient Spontanous Breathing and Patient connected to face mask oxygen  Post-op Assessment: Report given to RN and Post -op Vital signs reviewed and stable  Post vital signs: Reviewed and stable  Last Vitals:  Vitals Value Taken Time  BP 134/64 08/30/20 1158  Temp    Pulse 92 08/30/20 1206  Resp 16 08/30/20 1206  SpO2 99 % 08/30/20 1206  Vitals shown include unvalidated device data.  Last Pain:  Vitals:   08/30/20 0615  TempSrc:   PainSc: 0-No pain         Complications: No notable events documented.

## 2020-08-30 NOTE — Interval H&P Note (Signed)
History and Physical Interval Note:  08/30/2020 7:11 AM  Renee [name]  has presented today for surgery, with the diagnosis of pancreatic mass.  The various methods of treatment have been discussed with the patient and family. After consideration of risks, benefits and other options for treatment, the patient has consented to  Procedure(s): LAPAROSCOPIC POSSIBLE OPEN DISTAL PANCREATECTOMY (N/A) SPLENECTOMY WITH INTRAOPERATIVE ULTRASOUND (N/A) as a surgical intervention.  The patient's history has been reviewed, patient examined, no change in status, stable for surgery.  I have reviewed the patient's chart and labs.  Questions were answered to the patient's satisfaction.     Dwan Bolt

## 2020-08-31 ENCOUNTER — Encounter (HOSPITAL_COMMUNITY): Payer: Self-pay | Admitting: Surgery

## 2020-08-31 LAB — GLUCOSE, CAPILLARY
Glucose-Capillary: 130 mg/dL — ABNORMAL HIGH (ref 70–99)
Glucose-Capillary: 108 mg/dL — ABNORMAL HIGH (ref 70–99)
Glucose-Capillary: 129 mg/dL — ABNORMAL HIGH (ref 70–99)
Glucose-Capillary: 118 mg/dL — ABNORMAL HIGH (ref 70–99)
Glucose-Capillary: 109 mg/dL — ABNORMAL HIGH (ref 70–99)

## 2020-08-31 LAB — BASIC METABOLIC PANEL
Anion gap: 8 (ref 5–15)
BUN: 7 mg/dL — ABNORMAL LOW (ref 8–23)
CO2: 24 mmol/L (ref 22–32)
Calcium: 8 mg/dL — ABNORMAL LOW (ref 8.9–10.3)
Chloride: 103 mmol/L (ref 98–111)
Creatinine, Ser: 0.54 mg/dL (ref 0.44–1.00)
GFR, Estimated: >60 mL/min (ref >60)
Glucose, Bld: 135 mg/dL — ABNORMAL HIGH (ref 70–99)
Potassium: 3.9 mmol/L (ref 3.5–5.1)
Sodium: 135 mmol/L (ref 135–145)

## 2020-08-31 LAB — CBC
HCT: 31.9 % — ABNORMAL LOW (ref 36.0–46.0)
Hemoglobin: 11.2 g/dL — ABNORMAL LOW (ref 12.0–15.0)
MCH: 34.3 pg — ABNORMAL HIGH (ref 26.0–34.0)
MCHC: 35.1 g/dL (ref 30.0–36.0)
MCV: 97.6 fL (ref 80.0–100.0)
Platelets: 271 10*3/uL (ref 150–400)
RBC: 3.27 MIL/uL — ABNORMAL LOW (ref 3.87–5.11)
RDW: 11.9 % (ref 11.5–15.5)
WBC: 15.8 10*3/uL — ABNORMAL HIGH (ref 4.0–10.5)
nRBC: 0 % (ref 0.0–0.2)

## 2020-08-31 MED ORDER — HYDROMORPHONE HCL 1 MG/ML IJ SOLN
0.5000 mg | INTRAMUSCULAR | Status: DC | PRN
Start: 2020-08-31 — End: 2020-09-04
  Administered 2020-08-31: 0.5 mg via INTRAVENOUS
  Filled 2020-08-31: qty 1

## 2020-08-31 MED ORDER — KETOROLAC TROMETHAMINE 15 MG/ML IJ SOLN
15.0000 mg | Freq: Three times a day (TID) | INTRAMUSCULAR | Status: DC
  Administered 2020-08-31 – 2020-09-01 (×4): 15 mg via INTRAVENOUS
  Filled 2020-08-31 (×5): qty 1

## 2020-08-31 MED ORDER — TRAMADOL HCL 50 MG PO TABS
50.0000 mg | ORAL_TABLET | Freq: Four times a day (QID) | ORAL | Status: DC | PRN
  Administered 2020-09-01: 50 mg via ORAL
  Filled 2020-08-31: qty 1

## 2020-08-31 MED ORDER — ACETAMINOPHEN 325 MG PO TABS
650.0000 mg | ORAL_TABLET | Freq: Four times a day (QID) | ORAL | Status: DC
  Administered 2020-08-31 – 2020-09-02 (×9): 650 mg via ORAL
  Filled 2020-08-31 (×9): qty 2

## 2020-08-31 MED ORDER — DEXTROSE-NACL 5-0.45 % IV SOLN: INTRAVENOUS | Status: DC

## 2020-08-31 MED ORDER — METOPROLOL TARTRATE 5 MG/5ML IV SOLN
5.0000 mg | Freq: Four times a day (QID) | INTRAVENOUS | Status: DC
  Administered 2020-08-31 – 2020-09-01 (×3): 5 mg via INTRAVENOUS
  Filled 2020-08-31 (×3): qty 5

## 2020-08-31 NOTE — Evaluation (Signed)
Physical Therapy Evaluation Patient Details Name: Renee [name] MRN: [id number] DOB: November 14, 1940 Today's Date: 08/31/2020   History of Present Illness  Pt is a 80 y.o. 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 Significant PMH: COPD, DVT.  Clinical Impression  PTA, pt lives with her spouse and is independent. On PT evaluation, noted minimal serosanguineous drainage from incisional site; notified RN who applied additional Tegaderm prior to mobilization. Pt overall verbalizing good pain control and is moving well. Ambulating x 120 feet with a walker at a min guard assist level. Suspect steady progress. Will continue to follow acutely to promote mobility as tolerated.     Follow Up Recommendations No PT follow up;Supervision for mobility/OOB    Equipment Recommendations  None recommended by PT (has needed DME)    Recommendations for Other Services       Precautions / Restrictions Precautions Precautions: Fall;Other (comment) Precaution Comments: JP bulb Restrictions Weight Bearing Restrictions: No      Mobility  Bed Mobility Overal bed mobility: Needs Assistance Bed Mobility: Supine to Sit     Supine to sit: Supervision          Transfers Overall transfer level: Needs assistance Equipment used: Rolling walker (2 wheeled) Transfers: Sit to/from Stand Sit to Stand: Min guard            Ambulation/Gait Ambulation/Gait assistance: Min guard Gait Distance (Feet): 120 Feet Assistive device: Rolling walker (2 wheeled) Gait Pattern/deviations: Step-through pattern;Decreased stride length Gait velocity: decreased   General Gait Details: Cues for walker use, proximity, upward gaze. Slow and steady pace  Stairs            Wheelchair Mobility    Modified Rankin (Stroke Patients Only)       Balance Overall balance assessment: Mild deficits observed, not formally tested                                            Pertinent Vitals/Pain Pain Assessment: Faces Faces Pain Scale: Hurts a little bit Pain Location: incisional site Pain Descriptors / Indicators: Guarding Pain Intervention(s): Monitored during session    Home Living Family/patient expects to be discharged to:: Private residence Living Arrangements: Spouse/significant other Available Help at Discharge: Family Type of Home: House Home Access: Ramped entrance     Home Layout: One level Home Equipment: Environmental consultant - 2 wheels;Cane - single point;Bedside commode;Shower seat      Prior Function Level of Independence: Independent               Hand Dominance        Extremity/Trunk Assessment   Upper Extremity Assessment Upper Extremity Assessment: Overall WFL for tasks assessed    Lower Extremity Assessment Lower Extremity Assessment: Overall WFL for tasks assessed    Cervical / Trunk Assessment Cervical / Trunk Assessment: Kyphotic  Communication   Communication: No difficulties  Cognition Arousal/Alertness: Awake/alert Behavior During Therapy: WFL for tasks assessed/performed Overall Cognitive Status: Within Functional Limits for tasks assessed                                        General Comments      Exercises     Assessment/Plan    PT Assessment Patient needs continued PT services  PT  Problem List Decreased strength;Decreased activity tolerance;Decreased balance;Decreased mobility;Pain       PT Treatment Interventions DME instruction;Gait training;Stair training;Therapeutic activities;Therapeutic exercise;Balance training;Patient/family education    PT Goals (Current goals can be found in the Care Plan section)  Acute Rehab PT Goals Patient Stated Goal: continued pain control PT Goal Formulation: With patient Time For Goal Achievement: 09/14/20 Potential to Achieve Goals: Good    Frequency Min 3X/week   Barriers to discharge        Co-evaluation                AM-PAC PT "6 Clicks" Mobility  Outcome Measure Help needed turning from your back to your side while in a flat bed without using bedrails?: None Help needed moving from lying on your back to sitting on the side of a flat bed without using bedrails?: A Little Help needed moving to and from a bed to a chair (including a wheelchair)?: A Little Help needed standing up from a chair using your arms (e.g., wheelchair or bedside chair)?: A Little Help needed to walk in hospital room?: A Little Help needed climbing 3-5 steps with a railing? : A Little 6 Click Score: 19    End of Session   Activity Tolerance: Patient tolerated treatment well Patient left: in chair;with call bell/phone within reach Nurse Communication: Mobility status PT Visit Diagnosis: Difficulty in walking, not elsewhere classified (R26.2);Pain Pain - part of body:  (abdomen)    Time: 9381-0175 PT Time Calculation (min) (ACUTE ONLY): 23 min   Charges:   PT Evaluation $PT Eval Low Complexity: 1 Low PT Treatments $Gait Training: 8-22 mins        Renee [name], PT, DPT Acute Rehabilitation Services Pager [phone] Office [phone]   Renee [name] 08/31/2020, 10:11 AM

## 2020-08-31 NOTE — Progress Notes (Signed)
    1 Day Post-Op  Subjective: No acute events. Afebrile, vitals stable. Patient reports sore throat from NG tube, minimal abdominal pain. Mild nausea. Hgb stable, labs unremarkable.   Objective: Vital signs in last 24 hours: Temp:  [97 F (36.1 C)-98.3 F (36.8 C)] 98.3 F (36.8 C) (08/02 0735) Pulse Rate:  [84-113] 95 (08/02 0735) Resp:  [8-22] 19 (08/02 0735) BP: (107-154)/(52-74) 129/54 (08/02 0735) SpO2:  [90 %-100 %] 94 % (08/02 0735) Arterial Line BP: (100-166)/(41-71) 108/71 (08/01 1830)    Intake/Output from previous day: 08/01 0701 - 08/02 0700 In: 2826.9 [I.V.:2526.9; IV Piggyback:250] Out: 2645 [Urine:2015; Emesis/NG output:125; Drains:205; Blood:300] Intake/Output this shift: No intake/output data recorded.  PE: General: resting comfortably, NAD Neuro: alert and oriented, no focal deficits Resp: normal work of breathing on room air CV: RRR Abdomen: soft, nondistended, minimally tender to palpation. Incisions have surrounding ecchymosis but no erythema or induration. LUQ JP with serosanguinous drainage. Extremities: warm and well-perfused   Lab Results:  Recent Labs    08/30/20 1210 08/31/20 0306  WBC 13.3* 15.8*  HGB 10.6* 11.2*  HCT 31.5* 31.9*  PLT 215 271   BMET Recent Labs    08/30/20 1400 08/31/20 0306  NA 137 135  K 3.3* 3.9  CL 104 103  CO2 23 24  GLUCOSE 218* 135*  BUN 7* 7*  CREATININE 0.57 0.54  CALCIUM 7.7* 8.0*   PT/INR No results for input(s): LABPROT, INR in the last 72 hours. CMP     Component Value Date/Time   NA 135 08/31/2020 0306   NA 138 10/29/2018 1006   K 3.9 08/31/2020 0306   CL 103 08/31/2020 0306   CO2 24 08/31/2020 0306   GLUCOSE 135 (H) 08/31/2020 0306   BUN 7 (L) 08/31/2020 0306   BUN 12 10/29/2018 1006   CREATININE 0.54 08/31/2020 0306   CREATININE 0.67 11/16/2014 0846   CALCIUM 8.0 (L) 08/31/2020 0306   PROT 7.2 04/19/2020 1634   PROT 6.6 02/03/2019 1017   ALBUMIN 4.5 04/19/2020 1634   ALBUMIN  4.3 02/03/2019 1017   AST 17 04/19/2020 1634   ALT 8 04/19/2020 1634   ALKPHOS 55 04/19/2020 1634   BILITOT 0.8 04/19/2020 1634   BILITOT 0.5 02/03/2019 1017   GFRNONAA >60 08/31/2020 0306   GFRAA >60 04/11/2019 0924   Lipase     Component Value Date/Time   LIPASE 28.0 04/19/2020 1634        Assessment/Plan 80 yo female with IPMN in body of pancreas, POD1 s/p laparoscopic distal pancreatectomy and splenectomy. - NG tube removed. Ok for sips of clear liquids. - Remove foley - Ambulate, PT consulted - Pulmonary toilet, IS - Maintenance IV fluids at 75 ml/hr - Multimodal pain control: scheduled toradol, tylenol, prn tramadol, robaxin and dilaudid  - Patient was unable to get splenectomy vaccines prior to surgery, will administer at discharge. - VTE: lovenox, SCDs - Dispo: progressive care    LOS: 1 day    Michaelle Birks, MD [facility] Surgery General, Hepatobiliary and Pancreatic Surgery 08/31/20 7:49 AM

## 2020-08-31 NOTE — Plan of Care (Signed)

## 2020-09-01 ENCOUNTER — Ambulatory Visit: Payer: Medicare Other | Admitting: Cardiovascular Disease

## 2020-09-01 LAB — CBC
HCT: 35.2 % — ABNORMAL LOW (ref 36.0–46.0)
Hemoglobin: 11.6 g/dL — ABNORMAL LOW (ref 12.0–15.0)
MCH: 33.8 pg (ref 26.0–34.0)
MCHC: 33 g/dL (ref 30.0–36.0)
MCV: 102.6 fL — ABNORMAL HIGH (ref 80.0–100.0)
Platelets: 266 10*3/uL (ref 150–400)
RBC: 3.43 MIL/uL — ABNORMAL LOW (ref 3.87–5.11)
RDW: 12 % (ref 11.5–15.5)
WBC: 21.6 10*3/uL — ABNORMAL HIGH (ref 4.0–10.5)
nRBC: 0 % (ref 0.0–0.2)

## 2020-09-01 LAB — BASIC METABOLIC PANEL
Anion gap: 9 (ref 5–15)
BUN: <5 mg/dL — ABNORMAL LOW (ref 8–23)
CO2: 27 mmol/L (ref 22–32)
Calcium: 8 mg/dL — ABNORMAL LOW (ref 8.9–10.3)
Chloride: 102 mmol/L (ref 98–111)
Creatinine, Ser: 0.55 mg/dL (ref 0.44–1.00)
GFR, Estimated: >60 mL/min (ref >60)
Glucose, Bld: 141 mg/dL — ABNORMAL HIGH (ref 70–99)
Potassium: 3.8 mmol/L (ref 3.5–5.1)
Sodium: 138 mmol/L (ref 135–145)

## 2020-09-01 LAB — GLUCOSE, CAPILLARY
Glucose-Capillary: 121 mg/dL — ABNORMAL HIGH (ref 70–99)
Glucose-Capillary: 122 mg/dL — ABNORMAL HIGH (ref 70–99)
Glucose-Capillary: 128 mg/dL — ABNORMAL HIGH (ref 70–99)
Glucose-Capillary: 129 mg/dL — ABNORMAL HIGH (ref 70–99)
Glucose-Capillary: 130 mg/dL — ABNORMAL HIGH (ref 70–99)
Glucose-Capillary: 131 mg/dL — ABNORMAL HIGH (ref 70–99)
Glucose-Capillary: 132 mg/dL — ABNORMAL HIGH (ref 70–99)

## 2020-09-01 LAB — SURGICAL PATHOLOGY

## 2020-09-01 MED ORDER — ONDANSETRON HCL 4 MG/2ML IJ SOLN
4.0000 mg | Freq: Four times a day (QID) | INTRAMUSCULAR | Status: DC | PRN
  Administered 2020-09-01 – 2020-09-04 (×4): 4 mg via INTRAVENOUS
  Filled 2020-09-01 (×4): qty 2

## 2020-09-01 MED ORDER — DOCUSATE SODIUM 100 MG PO CAPS
100.0000 mg | ORAL_CAPSULE | Freq: Two times a day (BID) | ORAL | Status: DC
  Administered 2020-09-01 – 2020-09-06 (×12): 100 mg via ORAL
  Filled 2020-09-01 (×13): qty 1

## 2020-09-01 MED ORDER — METOPROLOL TARTRATE 50 MG PO TABS
50.0000 mg | ORAL_TABLET | Freq: Two times a day (BID) | ORAL | Status: DC
  Administered 2020-09-01 – 2020-09-07 (×13): 50 mg via ORAL
  Filled 2020-09-01 (×13): qty 1

## 2020-09-01 NOTE — Progress Notes (Signed)
    2 Days Post-Op  Subjective: Mild tachycardia, on scheduled IV metoprolol. Afebrile. WBC 21. Complains of headache this morning and some abdominal soreness. Mild nausea but no vomiting, PO intake yesterday was 720. Did well with ambulation yesterday, sat in chair for several hours.   Objective: Vital signs in last 24 hours: Temp:  [97.6 F (36.4 C)-99.9 F (37.7 C)] 99.9 F (37.7 C) (08/02 2004) Pulse Rate:  [95-120] 112 (08/03 0425) Resp:  [17-24] 18 (08/03 0425) BP: (125-148)/(51-92) 135/92 (08/03 0425) SpO2:  [91 %-96 %] 95 % (08/03 0425) Last BM Date: 08/30/20  Intake/Output from previous day: 08/02 0701 - 08/03 0700 In: 2221.7 [P.O.:720; I.V.:1501.7] Out: 820 [Urine:800; Drains:20] Intake/Output this shift: No intake/output data recorded.  PE: General: resting comfortably, NAD Neuro: alert and oriented, no focal deficits Resp: normal work of breathing on room air CV: tachycardic low 100s, regular rhythm Abdomen: soft, nondistended, minimally tender to palpation. Incisions have surrounding ecchymosis but no erythema or induration. LUQ JP with serosanguinous drainage. Extremities: warm and well-perfused   Lab Results:  Recent Labs    08/31/20 0306 09/01/20 0247  WBC 15.8* 21.6*  HGB 11.2* 11.6*  HCT 31.9* 35.2*  PLT 271 266   BMET Recent Labs    08/31/20 0306 09/01/20 0247  NA 135 138  K 3.9 3.8  CL 103 102  CO2 24 27  GLUCOSE 135* 141*  BUN 7* <5*  CREATININE 0.54 0.55  CALCIUM 8.0* 8.0*   PT/INR No results for input(s): LABPROT, INR in the last 72 hours. CMP     Component Value Date/Time   NA 138 09/01/2020 0247   NA 138 10/29/2018 1006   K 3.8 09/01/2020 0247   CL 102 09/01/2020 0247   CO2 27 09/01/2020 0247   GLUCOSE 141 (H) 09/01/2020 0247   BUN <5 (L) 09/01/2020 0247   BUN 12 10/29/2018 1006   CREATININE 0.55 09/01/2020 0247   CREATININE 0.67 11/16/2014 0846   CALCIUM 8.0 (L) 09/01/2020 0247   PROT 7.2 04/19/2020 1634   PROT 6.6  02/03/2019 1017   ALBUMIN 4.5 04/19/2020 1634   ALBUMIN 4.3 02/03/2019 1017   AST 17 04/19/2020 1634   ALT 8 04/19/2020 1634   ALKPHOS 55 04/19/2020 1634   BILITOT 0.8 04/19/2020 1634   BILITOT 0.5 02/03/2019 1017   GFRNONAA >60 09/01/2020 0247   GFRAA >60 04/11/2019 0924   Lipase     Component Value Date/Time   LIPASE 28.0 04/19/2020 1634        Assessment/Plan 80 yo female with IPMN in body of pancreas, POD2 s/p laparoscopic distal pancreatectomy and splenectomy. - Advance to full liquid diet - IVF to 50 ml/hr - Leukocytosis most likely secondary to asplenia, no other systemic signs of sepsis. - Transition to oral home metoprolol dose 50mg  BID - Ambulate, pulmonary toilet - Pulmonary toilet, IS - Multimodal pain control: scheduled toradol, tylenol, prn tramadol, robaxin and dilaudid  - Patient was unable to get splenectomy vaccines prior to surgery, will administer at discharge. - VTE: lovenox, SCDs - Dispo: transfer to med-surg floor    LOS: 2 days    Michaelle Birks, MD [facility] Surgery General, Hepatobiliary and Pancreatic Surgery 09/01/20 7:20 AM

## 2020-09-01 NOTE — Progress Notes (Signed)
   09/01/20 1150  Clinical Encounter Type  Visited With Patient and family together  Visit Type Initial  Referral From Nurse  Consult/Referral To Chaplain   Chaplain responded to consult for AD. Pt already had Advance Directive paperwork and was sleepy. Chaplain informed Pt and Pt's visitors how to page chaplain if there are any questions and if/when Pt is ready for notary.   This note was prepared by Chaplain Resident, Dante Gang, MDiv. Chaplain remains available as needed through the on-call pager: [phone].

## 2020-09-02 LAB — BASIC METABOLIC PANEL
Anion gap: 9 (ref 5–15)
BUN: 6 mg/dL — ABNORMAL LOW (ref 8–23)
CO2: 24 mmol/L (ref 22–32)
Calcium: 7.6 mg/dL — ABNORMAL LOW (ref 8.9–10.3)
Chloride: 98 mmol/L (ref 98–111)
Creatinine, Ser: 0.53 mg/dL (ref 0.44–1.00)
GFR, Estimated: >60 mL/min (ref >60)
Glucose, Bld: 131 mg/dL — ABNORMAL HIGH (ref 70–99)
Potassium: 3.1 mmol/L — ABNORMAL LOW (ref 3.5–5.1)
Sodium: 131 mmol/L — ABNORMAL LOW (ref 135–145)

## 2020-09-02 LAB — GLUCOSE, CAPILLARY
Glucose-Capillary: 120 mg/dL — ABNORMAL HIGH (ref 70–99)
Glucose-Capillary: 112 mg/dL — ABNORMAL HIGH (ref 70–99)
Glucose-Capillary: 110 mg/dL — ABNORMAL HIGH (ref 70–99)
Glucose-Capillary: 96 mg/dL (ref 70–99)

## 2020-09-02 LAB — CBC
HCT: 34.3 % — ABNORMAL LOW (ref 36.0–46.0)
Hemoglobin: 11.4 g/dL — ABNORMAL LOW (ref 12.0–15.0)
MCH: 33.6 pg (ref 26.0–34.0)
MCHC: 33.2 g/dL (ref 30.0–36.0)
MCV: 101.2 fL — ABNORMAL HIGH (ref 80.0–100.0)
Platelets: 239 10*3/uL (ref 150–400)
RBC: 3.39 MIL/uL — ABNORMAL LOW (ref 3.87–5.11)
RDW: 11.9 % (ref 11.5–15.5)
WBC: 18.3 10*3/uL — ABNORMAL HIGH (ref 4.0–10.5)
nRBC: 0 % (ref 0.0–0.2)

## 2020-09-02 LAB — AMYLASE, PLEURAL OR PERITONEAL FLUID

## 2020-09-02 MED ORDER — POTASSIUM CHLORIDE CRYS ER 20 MEQ PO TBCR
40.0000 meq | EXTENDED_RELEASE_TABLET | Freq: Once | ORAL | Status: AC
  Administered 2020-09-02: 40 meq via ORAL
  Filled 2020-09-02: qty 2

## 2020-09-02 MED ORDER — HAEMOPHILUS B POLYSAC CONJ VAC IM SOLR
0.5000 mL | INTRAMUSCULAR | Status: DC | PRN
  Filled 2020-09-02: qty 0.5

## 2020-09-02 MED ORDER — PNEUMOCOCCAL VAC POLYVALENT 25 MCG/0.5ML IJ INJ
0.5000 mL | INJECTION | INTRAMUSCULAR | Status: DC | PRN
Start: 2020-09-02 — End: 2020-09-07
  Filled 2020-09-02: qty 0.5

## 2020-09-02 MED ORDER — ENSURE ENLIVE PO LIQD
237.0000 mL | Freq: Two times a day (BID) | ORAL | Status: DC
  Administered 2020-09-02 – 2020-09-06 (×5): 237 mL via ORAL
  Filled 2020-09-02 (×2): qty 237

## 2020-09-02 MED ORDER — INSULIN ASPART 100 UNIT/ML IJ SOLN
0.0000 [IU] | Freq: Three times a day (TID) | INTRAMUSCULAR | Status: DC
  Administered 2020-09-03: 2 [IU] via SUBCUTANEOUS
  Administered 2020-09-03: 3 [IU] via SUBCUTANEOUS
  Administered 2020-09-03 – 2020-09-04 (×2): 2 [IU] via SUBCUTANEOUS
  Administered 2020-09-04: 3 [IU] via SUBCUTANEOUS
  Administered 2020-09-04 – 2020-09-05 (×4): 2 [IU] via SUBCUTANEOUS
  Administered 2020-09-06: 3 [IU] via SUBCUTANEOUS
  Administered 2020-09-06 – 2020-09-07 (×3): 2 [IU] via SUBCUTANEOUS

## 2020-09-02 MED ORDER — HYDROCODONE-ACETAMINOPHEN 5-325 MG PO TABS
1.0000 | ORAL_TABLET | ORAL | Status: DC | PRN
Start: 2020-09-02 — End: 2020-09-03
  Administered 2020-09-02 (×2): 1 via ORAL
  Filled 2020-09-02 (×2): qty 1

## 2020-09-02 MED ORDER — MENINGOCOCCAL A C Y&W-135 OLIG IM SOLR
0.5000 mL | INTRAMUSCULAR | Status: DC | PRN
  Filled 2020-09-02: qty 0.5

## 2020-09-02 MED ORDER — METHOCARBAMOL 500 MG PO TABS: 500.0000 mg | ORAL_TABLET | Freq: Four times a day (QID) | ORAL | Status: DC | PRN

## 2020-09-02 NOTE — Progress Notes (Signed)
    3 Days Post-Op  Subjective: Afebrile, tachycardia resolved. WBC downtrending. Patient reports she is passing some flatus and had a small bowel movement yesterday. Reports poor pain control, only taking tylenol because has nausea with tramadol.   Objective: Vital signs in last 24 hours: Temp:  [97.5 F (36.4 C)-100.2 F (37.9 C)] 97.8 F (36.6 C) (08/04 0739) Pulse Rate:  [93-100] 93 (08/04 0739) Resp:  [24-28] 24 (08/04 0739) BP: (119-138)/(52-65) 126/65 (08/04 0739) SpO2:  [94 %-96 %] 95 % (08/04 0739) Last BM Date: 09/01/20  Intake/Output from previous day: 08/03 0701 - 08/04 0700 In: 2034.1 [P.O.:717; I.V.:1317.1] Out: 0  Intake/Output this shift: No intake/output data recorded.  PE: General: resting comfortably, NAD Neuro: alert and oriented, no focal deficits Resp: normal work of breathing on room air CV: RRR Abdomen: soft, mildly distended and tender to palpation. Incisions have surrounding ecchymosis but no erythema or induration. LUQ JP with scant serosanguinous drainage. Extremities: warm and well-perfused   Lab Results:  Recent Labs    09/01/20 0247 09/02/20 0359  WBC 21.6* 18.3*  HGB 11.6* 11.4*  HCT 35.2* 34.3*  PLT 266 239   BMET Recent Labs    09/01/20 0247 09/02/20 0359  NA 138 131*  K 3.8 3.1*  CL 102 98  CO2 27 24  GLUCOSE 141* 131*  BUN <5* 6*  CREATININE 0.55 0.53  CALCIUM 8.0* 7.6*   PT/INR No results for input(s): LABPROT, INR in the last 72 hours. CMP     Component Value Date/Time   NA 131 (L) 09/02/2020 0359   NA 138 10/29/2018 1006   K 3.1 (L) 09/02/2020 0359   CL 98 09/02/2020 0359   CO2 24 09/02/2020 0359   GLUCOSE 131 (H) 09/02/2020 0359   BUN 6 (L) 09/02/2020 0359   BUN 12 10/29/2018 1006   CREATININE 0.53 09/02/2020 0359   CREATININE 0.67 11/16/2014 0846   CALCIUM 7.6 (L) 09/02/2020 0359   PROT 7.2 04/19/2020 1634   PROT 6.6 02/03/2019 1017   ALBUMIN 4.5 04/19/2020 1634   ALBUMIN 4.3 02/03/2019 1017   AST  17 04/19/2020 1634   ALT 8 04/19/2020 1634   ALKPHOS 55 04/19/2020 1634   BILITOT 0.8 04/19/2020 1634   BILITOT 0.5 02/03/2019 1017   GFRNONAA >60 09/02/2020 0359   GFRAA >60 04/11/2019 0924   Lipase     Component Value Date/Time   LIPASE 28.0 04/19/2020 1634        Assessment/Plan 80 yo female with IPMN in body of pancreas, POD3 s/p laparoscopic distal pancreatectomy and splenectomy. - Advance to soft diet, SLIV - Abdomen is mildly distended today but patient is having bowel function with no vomiting. Continue to monitor. - Continue home metoprolol and aspirin - Ambulate, pulmonary toilet, IS - Begin prn norco for pain, patient reports hydrocodone has worked well for her in the past. - Splenectomy vaccines prior to discharge - Drain amylase pending - Surgical pathology confirmed an IPMN with multiple areas of high-grade dysplasia, but no invasive cancer. I discussed these results with the patient today. She does not need further treatment but will require surveillance. - VTE: lovenox, SCDs - Dispo: transfer to med-surg floor, awaiting bed.    LOS: 3 days    Michaelle Birks, MD [facility] Surgery General, Hepatobiliary and Pancreatic Surgery 09/02/20 8:34 AM

## 2020-09-02 NOTE — Care Management Important Message (Signed)
Important Message  Patient Details  Name: Renee [name] MRN: [id number] Date of Birth: 1940-03-18   Medicare Important Message Given:  Yes     Orazio Weller [address] 09/02/2020, 3:57 PM

## 2020-09-02 NOTE — Plan of Care (Signed)
°  Problem: Activity: °Goal: Risk for activity intolerance will decrease °Outcome: Progressing °  °Problem: Elimination: °Goal: Will not experience complications related to urinary retention °Outcome: Progressing °  °Problem: Pain Managment: °Goal: General experience of comfort will improve °Outcome: Progressing °  °

## 2020-09-02 NOTE — Progress Notes (Signed)
Physical Therapy Treatment Patient Details Name: Renee [name] MRN: [id number] DOB: 04/02/1940 Today's Date: 09/02/2020    History of Present Illness Pt is a 80 y.o. 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 Significant PMH: COPD, DVT.    PT Comments    Pt progressed to ambulating without UE support this date with min guard assist. However, she displayed increased fatigue, abdominal pain, and instability as distance progressed. Thus, educated pt to continue using her RW at d/c until her pain and endurance improve, but that she could begin trying to progress away from UE support for shorter distances with close supervision. Educated pt on verbal HEP, consisting of heel slide, quad sets, ankle pumps, and SLR with pt performing several reps of each exercise to demonstrate comprehension. Will continue to follow acutely. Current recommendations remain appropriate.   Follow Up Recommendations  No PT follow up;Supervision for mobility/OOB     Equipment Recommendations  None recommended by PT (has needed DME)    Recommendations for Other Services       Precautions / Restrictions Precautions Precautions: Fall;Other (comment) Precaution Comments: JP bulb Restrictions Weight Bearing Restrictions: No    Mobility  Bed Mobility Overal bed mobility: Needs Assistance Bed Mobility: Rolling;Sidelying to Sit;Sit to Sidelying Rolling: Supervision Sidelying to sit: Supervision     Sit to sidelying: Supervision General bed mobility comments: Cues to log roll to and transition sidelying <> sit to reduce abdominal muscle use and thus pain. Increased time with supervision to complete with bed flat and pt using rails.    Transfers Overall transfer level: Needs assistance Equipment used: Rolling walker (2 wheeled) Transfers: Sit to/from Stand Sit to Stand: Min guard         General transfer comment: Min guard assist for safety, no LOB, extra  time to power up to stand.  Ambulation/Gait Ambulation/Gait assistance: Min guard Gait Distance (Feet): 140 Feet Assistive device: None Gait Pattern/deviations: Step-through pattern;Decreased stride length;Trunk flexed Gait velocity: decreased Gait velocity interpretation: <1.8 ft/sec, indicate of risk for recurrent falls General Gait Details: Pt initially grabbing onto objects for 1 UE support but quickly progressed to no UE support. Decreased gait speed with pt maintaining trunk flexed guarding abdomen. x1 minor trunk sway but pt recovered. Increased leg instability as pt fatigues. Min guard assist for safety. Educated pt to have supervision with mobility, especially when trying to progress away from RW, but can use RW initially upon d/c for increased stability and pain management until she improves.   Stairs             Wheelchair Mobility    Modified Rankin (Stroke Patients Only)       Balance Overall balance assessment: Mild deficits observed, not formally tested                                          Cognition Arousal/Alertness: Awake/alert Behavior During Therapy: WFL for tasks assessed/performed Overall Cognitive Status: Within Functional Limits for tasks assessed                                        Exercises General Exercises - Lower Extremity Ankle Circles/Pumps: AROM;Both;Other reps (comment);Supine (x3 reps) Quad Sets: Strengthening;Both;Other reps (comment);Supine (x2 reps) Heel Slides: AROM;Both;Other reps (comment);Supine (x2 reps)  Straight Leg Raises: AROM;Both;Other reps (comment);Supine (x2 reps)    General Comments General comments (skin integrity, edema, etc.): VSS on RA; educated pt to perform HEP of SLR, quad sets, heel slides, and ankle pumps      Pertinent Vitals/Pain Pain Assessment: 0-10 Pain Score: 3  Pain Location: incisional site Pain Descriptors / Indicators: Guarding;Grimacing Pain  Intervention(s): Limited activity within patient's tolerance;Monitored during session;Repositioned;Premedicated before session    Home Living                      Prior Function            PT Goals (current goals can now be found in the care plan section) Acute Rehab PT Goals Patient Stated Goal: to feel better PT Goal Formulation: With patient Time For Goal Achievement: 09/14/20 Potential to Achieve Goals: Good Progress towards PT goals: Progressing toward goals    Frequency    Min 3X/week      PT Plan Current plan remains appropriate    Co-evaluation              AM-PAC PT "6 Clicks" Mobility   Outcome Measure  Help needed turning from your back to your side while in a flat bed without using bedrails?: None Help needed moving from lying on your back to sitting on the side of a flat bed without using bedrails?: A Little Help needed moving to and from a bed to a chair (including a wheelchair)?: A Little Help needed standing up from a chair using your arms (e.g., wheelchair or bedside chair)?: A Little Help needed to walk in hospital room?: A Little Help needed climbing 3-5 steps with a railing? : A Little 6 Click Score: 19    End of Session Equipment Utilized During Treatment: Gait belt Activity Tolerance: Patient limited by pain;Other (comment) (limited by nausea) Patient left: with call bell/phone within reach;in bed;with bed alarm set;with family/visitor present Nurse Communication: Mobility status;Other (comment) (pt felt nauseated and "like in a dream" from recent meds) PT Visit Diagnosis: Difficulty in walking, not elsewhere classified (R26.2);Pain;Unsteadiness on feet (R26.81);Other abnormalities of gait and mobility (R26.89) Pain - part of body:  (abdomen)     Time: 5631-4970 PT Time Calculation (min) (ACUTE ONLY): 16 min  Charges:  $Gait Training: 8-22 mins                     Moishe Spice, PT, DPT Acute Rehabilitation Services  Pager:  [phone] Office: [place] 09/02/2020, 12:43 PM

## 2020-09-02 NOTE — Discharge Instructions (Addendum)
[facility] DISCHARGE INSTRUCTIONS  Activity No heavy lifting greater than 10 pounds for 4 weeks after surgery. Ok to shower, but do not bathe or submerge incisions underwater. Do not drive while taking narcotic pain medication. Be sure to walk around your home at least 3 times daily. This will help avoid blood clots and will improve your strength.  Wound Care Your incisions are covered with skin glue called Dermabond. This will peel off on its own over time. You may shower and allow warm soapy water to run over your incisions. Gently pat dry. Do not submerge your incisions underwater. Monitor your incision for any new redness, tenderness, or drainage.  JP DRAIN CARE INSTRUCTIONS Wash your hands prior to caring for your drain. Uncap the bulb to release the suction. Pour out the bulb contents into a measuring cup and record the amount. Squeeze the bulb and replace the cap to apply suction again. You should empty your drain each time you notice the bulb is full. Empty at least once a day and more often when the bulb fills up.  Please keep a daily log of your drain output and bring this with you when you come to clinic.   Medications You may take Tramadol up to every 6 hours as needed for pain. Please reserve this medication only for the most severe pain. Take a stool softener (colace) twice daily to prevent constipation.  Diet You may notice that you feel full early after meals, or have occasional nausea or decreased appetite. This is common following pancreas surgery and will improve with time. If you feel full early after meals, it is better to consume small frequent meals throughout the day rather than 3 large meals. Avoid drinking a lot of carbonated beverages, as these can worsen symptoms of nausea and reflux. If you are having frequent vomiting after meals or are unable to keep down any food or drink, please call the office right away.  When to Call us: Fever  greater than 100.5 New redness, drainage, or swelling at incision site Severe pain, nausea, or vomiting Excessive vomiting or inability to keep down any liquids Jaundice (yellowing of the whites of the eyes or skin)  Vaccines You had your spleen removed during your surgery. This puts you at slightly higher risk of getting certain bacterial infections. You have been given vaccines to prevent these infections, but you will need booster doses in 2 months. These can be done by your primary care doctor. Dr. Zenia Resides will provide you with instructions and a prescription for these vaccines.  Follow-up You have an appointment scheduled with Dr. Zenia Resides in 1 week for a drain check. This will be at the [facility] office at [address] [place], [place], [place]. Please arrive at least 15 minutes prior to your scheduled appointment time.  For questions or concerns, please call the office at [phone].

## 2020-09-03 LAB — GLUCOSE, CAPILLARY
Glucose-Capillary: 160 mg/dL — ABNORMAL HIGH (ref 70–99)
Glucose-Capillary: 123 mg/dL — ABNORMAL HIGH (ref 70–99)
Glucose-Capillary: 147 mg/dL — ABNORMAL HIGH (ref 70–99)
Glucose-Capillary: 198 mg/dL — ABNORMAL HIGH (ref 70–99)

## 2020-09-03 MED ORDER — DOCUSATE SODIUM 100 MG PO CAPS: 100.0000 mg | ORAL_CAPSULE | Freq: Two times a day (BID) | ORAL | 0 refills | Status: DC

## 2020-09-03 MED ORDER — TRAMADOL HCL 50 MG PO TABS
50.0000 mg | ORAL_TABLET | Freq: Four times a day (QID) | ORAL | Status: DC | PRN
  Administered 2020-09-03 – 2020-09-06 (×3): 50 mg via ORAL
  Filled 2020-09-03 (×4): qty 1

## 2020-09-03 MED ORDER — ACETAMINOPHEN 325 MG PO TABS: 650.0000 mg | ORAL_TABLET | Freq: Four times a day (QID) | ORAL | Status: DC | PRN

## 2020-09-03 MED ORDER — POLYETHYLENE GLYCOL 3350 17 G PO PACK
17.0000 g | PACK | Freq: Every day | ORAL | Status: DC
  Administered 2020-09-03 – 2020-09-07 (×5): 17 g via ORAL
  Filled 2020-09-03 (×5): qty 1

## 2020-09-03 MED ORDER — METHOCARBAMOL 500 MG PO TABS
500.0000 mg | ORAL_TABLET | Freq: Once | ORAL | Status: AC
  Administered 2020-09-03: 500 mg via ORAL
  Filled 2020-09-03: qty 1

## 2020-09-03 MED ORDER — ACETAMINOPHEN 325 MG PO TABS
650.0000 mg | ORAL_TABLET | Freq: Four times a day (QID) | ORAL | Status: DC
  Administered 2020-09-03 – 2020-09-07 (×11): 650 mg via ORAL
  Filled 2020-09-03 (×14): qty 2

## 2020-09-03 NOTE — Progress Notes (Signed)
Physical Therapy Treatment Patient Details Name: Renee [name] MRN: [id number] DOB: November 25, 1940 Today's Date: 09/03/2020    History of Present Illness Pt is a 80 y.o. 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 Significant PMH: COPD, DVT.    PT Comments    Patient progressing well with acute PT and demonstrated improved tolerance to ambulation this date. PT mobilizing at min guard/supervision level to ambulate ~200' with no device. Pt required 1 seated rest at ~140' due to SOB, VSS. Pt mildly unsteady but no LOB noted and EOS reviewed functional LE strengthening with sit<>stands. Pt able to power up without UE use. Acute PT will continue to progress as able, anticipate pt will progress well.    Follow Up Recommendations  No PT follow up;Supervision for mobility/OOB     Equipment Recommendations  None recommended by PT    Recommendations for Other Services       Precautions / Restrictions Precautions Precautions: Fall;Other (comment) Precaution Comments: JP bulb Restrictions Weight Bearing Restrictions: No    Mobility  Bed Mobility Overal bed mobility: Needs Assistance Bed Mobility: Rolling;Sidelying to Sit;Sit to Sidelying Rolling: Supervision Sidelying to sit: Supervision     Sit to sidelying: Supervision General bed mobility comments: Cues to log roll to and transition sidelying <> sit to reduce abdominal muscle use and thus pain. Increased time with supervision to complete with bed flat and pt using rails.    Transfers Overall transfer level: Needs assistance Equipment used: Rolling walker (2 wheeled) Transfers: Sit to/from Stand Sit to Stand: Min guard         General transfer comment: Min guard assist for safety, extra time to power up to stand.  Ambulation/Gait Ambulation/Gait assistance: Min guard;Supervision Gait Distance (Feet): 200 Feet (140, 60) Assistive device: None Gait Pattern/deviations:  Step-through pattern;Decreased stride length Gait velocity: decreased   General Gait Details: pt progressing well with ambulation and demonstrated increased tolerance today. no overt LOB noted however pt midly unsteady with turning and conitnues to have slow cautious gait. seated rest provided due to SOB, VSS wtih HR in 110's and SpO2 92% on RA and increasing to 95% with seated rest.   Stairs             Wheelchair Mobility    Modified Rankin (Stroke Patients Only)       Balance Overall balance assessment: Mild deficits observed, not formally tested                                          Cognition Arousal/Alertness: Awake/alert Behavior During Therapy: WFL for tasks assessed/performed Overall Cognitive Status: Within Functional Limits for tasks assessed                                        Exercises General Exercises - Lower Extremity Mini-Sqauts: Strengthening;Both;5 reps;Seated;Limitations Mini Squats Limitations: 5x Sit<>Stand with no UE use for power up.    General Comments        Pertinent Vitals/Pain Pain Assessment: 0-10 Pain Location: incisional site Pain Descriptors / Indicators: Guarding;Grimacing    Home Living                      Prior Function  PT Goals (current goals can now be found in the care plan section) Acute Rehab PT Goals Patient Stated Goal: to feel better PT Goal Formulation: With patient Time For Goal Achievement: 09/14/20 Potential to Achieve Goals: Good Progress towards PT goals: Progressing toward goals    Frequency    Min 3X/week      PT Plan Current plan remains appropriate    Co-evaluation              AM-PAC PT "6 Clicks" Mobility   Outcome Measure  Help needed turning from your back to your side while in a flat bed without using bedrails?: None Help needed moving from lying on your back to sitting on the side of a flat bed without using  bedrails?: A Little Help needed moving to and from a bed to a chair (including a wheelchair)?: A Little Help needed standing up from a chair using your arms (e.g., wheelchair or bedside chair)?: A Little Help needed to walk in hospital room?: A Little Help needed climbing 3-5 steps with a railing? : A Little 6 Click Score: 19    End of Session Equipment Utilized During Treatment: Gait belt Activity Tolerance: Patient tolerated treatment well Patient left: with call bell/phone within reach;in chair;with nursing/sitter in room Nurse Communication: Mobility status PT Visit Diagnosis: Difficulty in walking, not elsewhere classified (R26.2);Pain;Unsteadiness on feet (R26.81);Other abnormalities of gait and mobility (R26.89) Pain - part of body:  (abdomen)     Time: 4665-9935 PT Time Calculation (min) (ACUTE ONLY): 17 min  Charges:  $Gait Training: 8-22 mins                     Verner Mould, DPT Acute Rehabilitation Services Office [phone] Pager [phone]    Jacques Navy 09/03/2020, 5:03 PM

## 2020-09-03 NOTE — Progress Notes (Signed)
4 Days Post-Op  Subjective: Afebrile, HR 100s. Patient reports she generally feels unwell today, has a headache and some abdominal pain. Norco made her nauseated. She says the nausea resolves if she refrains from taking any narcotics. She is tolerating solid food with no vomiting. She is passing flatus but has not had a bowel movement yet.   Objective: Vital signs in last 24 hours: Temp:  [97.9 F (36.6 C)-99 F (37.2 C)] 98.2 F (36.8 C) (08/05 0400) Pulse Rate:  [102-111] 102 (08/05 0400) Resp:  [16-24] 17 (08/05 0400) BP: (119-152)/(57-89) 139/63 (08/05 0400) SpO2:  [96 %-100 %] 97 % (08/05 0400) Last BM Date: 08/31/20  Intake/Output from previous day: 08/04 0701 - 08/05 0700 In: 360 [P.O.:360] Out: 20 [Drains:20] Intake/Output this shift: No intake/output data recorded.  PE: General: resting comfortably, NAD Neuro: alert and oriented, no focal deficits Resp: normal work of breathing on room air CV: RRR Abdomen: soft, distension improved from yesterday, nontender. Incisions have surrounding ecchymosis but no erythema or induration. LUQ JP with scant milky brown fluid. Extremities: warm and well-perfused   Lab Results:  Recent Labs    09/01/20 0247 09/02/20 0359  WBC 21.6* 18.3*  HGB 11.6* 11.4*  HCT 35.2* 34.3*  PLT 266 239   BMET Recent Labs    09/01/20 0247 09/02/20 0359  NA 138 131*  K 3.8 3.1*  CL 102 98  CO2 27 24  GLUCOSE 141* 131*  BUN <5* 6*  CREATININE 0.55 0.53  CALCIUM 8.0* 7.6*   PT/INR No results for input(s): LABPROT, INR in the last 72 hours. CMP     Component Value Date/Time   NA 131 (L) 09/02/2020 0359   NA 138 10/29/2018 1006   K 3.1 (L) 09/02/2020 0359   CL 98 09/02/2020 0359   CO2 24 09/02/2020 0359   GLUCOSE 131 (H) 09/02/2020 0359   BUN 6 (L) 09/02/2020 0359   BUN 12 10/29/2018 1006   CREATININE 0.53 09/02/2020 0359   CREATININE 0.67 11/16/2014 0846   CALCIUM 7.6 (L) 09/02/2020 0359   PROT 7.2 04/19/2020 1634    PROT 6.6 02/03/2019 1017   ALBUMIN 4.5 04/19/2020 1634   ALBUMIN 4.3 02/03/2019 1017   AST 17 04/19/2020 1634   ALT 8 04/19/2020 1634   ALKPHOS 55 04/19/2020 1634   BILITOT 0.8 04/19/2020 1634   BILITOT 0.5 02/03/2019 1017   GFRNONAA >60 09/02/2020 0359   GFRAA >60 04/11/2019 0924   Lipase     Component Value Date/Time   LIPASE 28.0 04/19/2020 1634        Assessment/Plan 80 yo female with 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 - Continue soft diet - Bowel regimen: colace and miralax - Continue home metoprolol and aspirin - Ambulate, pulmonary toilet, IS - Pain control: Patient has had nausea with most narcotics and does not like toradol. Will resume scheduled tylenol and also make prn tramadol available, as I think this is least likely to cause nausea. - Splenectomy vaccines prior to discharge - Unable to perform drain amylase due to fluid viscosity, but drain fluid appears consistent with pancreatic fluid. Leak is low volume. Will leave drain in place. - VTE: lovenox, SCDs - Dispo: inpatient, med-surg. Patient has mild tachycardia but no fevers, abdominal distension improved today. Lab holiday today as labs were unremarkable the last few days. Will check labs again tomorrow morning to ensure no significant increase in WBC, although some degree of leukocytosis is  expected due to asplenia.    LOS: 4 days    Michaelle Birks, MD [facility] General, Hepatobiliary and Pancreatic Surgery 09/03/20 8:27 AM

## 2020-09-03 NOTE — Progress Notes (Signed)
Inpatient Diabetes Program Recommendations  AACE/ADA: New Consensus Statement on Inpatient Glycemic Control (2015)  Target Ranges:  Prepandial:   less than 140 mg/dL      Peak postprandial:   less than 180 mg/dL (1-2 hours)      Critically ill patients:  140 - 180 mg/dL   Results for Renee [name], Renee [name] (MRN [id number]) as of 09/03/2020 13:55  Ref. Range 09/02/2020 07:37 09/02/2020 11:58 09/02/2020 15:58  Glucose-Capillary Latest Ref Range: 70 - 99 mg/dL 112 (H) 110 (H) 96  Results for Renee [name], Renee [name] (MRN [id number]) as of 09/03/2020 13:55  Ref. Range 09/03/2020 08:27 09/03/2020 11:51  Glucose-Capillary Latest Ref Range: 70 - 99 mg/dL 160 (H) 123 (H)      Admit for Laparoscopic distal pancreatectomy and splenectomy (IPMN)   History of Diabetes per Review of Chart--Current A1c= 5.7%    Current Orders:  Novolog 0-15 units TID    POD #4  CBGs well controlled so far on current Novolog SSI regimen  No Diabetes Medication at home  Will follow     --Will follow patient during hospitalization--  Wyn Quaker RN, MSN, CDE Diabetes Coordinator Inpatient Glycemic Control Team Team Pager: [phone] (8a-5p)

## 2020-09-03 NOTE — Plan of Care (Signed)

## 2020-09-04 LAB — CBC
HCT: 35.1 % — ABNORMAL LOW (ref 36.0–46.0)
Hemoglobin: 11.7 g/dL — ABNORMAL LOW (ref 12.0–15.0)
MCH: 33.5 pg (ref 26.0–34.0)
MCHC: 33.3 g/dL (ref 30.0–36.0)
MCV: 100.6 fL — ABNORMAL HIGH (ref 80.0–100.0)
Platelets: 316 10*3/uL (ref 150–400)
RBC: 3.49 MIL/uL — ABNORMAL LOW (ref 3.87–5.11)
RDW: 11.9 % (ref 11.5–15.5)
WBC: 10.4 10*3/uL (ref 4.0–10.5)
nRBC: 0.6 % — ABNORMAL HIGH (ref 0.0–0.2)

## 2020-09-04 LAB — GLUCOSE, CAPILLARY
Glucose-Capillary: 133 mg/dL — ABNORMAL HIGH (ref 70–99)
Glucose-Capillary: 178 mg/dL — ABNORMAL HIGH (ref 70–99)
Glucose-Capillary: 121 mg/dL — ABNORMAL HIGH (ref 70–99)
Glucose-Capillary: 123 mg/dL — ABNORMAL HIGH (ref 70–99)

## 2020-09-04 LAB — BASIC METABOLIC PANEL
Anion gap: 8 (ref 5–15)
BUN: 7 mg/dL — ABNORMAL LOW (ref 8–23)
CO2: 26 mmol/L (ref 22–32)
Calcium: 8.2 mg/dL — ABNORMAL LOW (ref 8.9–10.3)
Chloride: 100 mmol/L (ref 98–111)
Creatinine, Ser: 0.56 mg/dL (ref 0.44–1.00)
GFR, Estimated: >60 mL/min (ref >60)
Glucose, Bld: 160 mg/dL — ABNORMAL HIGH (ref 70–99)
Potassium: 3.4 mmol/L — ABNORMAL LOW (ref 3.5–5.1)
Sodium: 134 mmol/L — ABNORMAL LOW (ref 135–145)

## 2020-09-04 MED ORDER — MELATONIN 3 MG PO TABS
3.0000 mg | ORAL_TABLET | Freq: Every evening | ORAL | Status: DC | PRN
  Administered 2020-09-04: 3 mg via ORAL
  Filled 2020-09-04 (×3): qty 1

## 2020-09-04 NOTE — Progress Notes (Signed)
Assessment & Plan: POD#5 - s/p laparoscopic distal pancreatectomy and splenectomy - Dr. Zenia Resides - Continue soft diet as tolerated - Bowel regimen: colace and miralax - Continue home metoprolol and aspirin - Ambulate, pulmonary toilet, IS - Splenectomy vaccines prior to discharge - monitor drain output - viscous tan fluid this AM - WBC 10.4, other labs OK  - VTE: lovenox, SCDs  Encouraged OOB, ambulation.  Small BM this AM.  Complains of nausea.  Continue to monitor.        [name], MD       [facility]       Office: [phone]   Chief Complaint: IPMN of pancreas  Subjective: Patient in bed, appears frail and weak.  Complains of nausea, limited po intake.  Ambulatory.  States small BM this AM.  Objective: Vital signs in last 24 hours: Temp:  [97.6 F (36.4 C)-98.2 F (36.8 C)] 98.2 F (36.8 C) (08/06 0440) Pulse Rate:  [91-105] 99 (08/06 0440) Resp:  [17] 17 (08/06 0440) BP: (132-147)/(66-71) 132/69 (08/06 0440) SpO2:  [95 %-98 %] 95 % (08/06 0440) Last BM Date: 08/31/20  Intake/Output from previous day: 08/05 0701 - 08/06 0700 In: -  Out: 30 [Drains:30] Intake/Output this shift: Total I/O In: -  Out: 1 [Stool:1]  Physical Exam: HEENT - sclerae clear, mucous membranes moist Neck - soft Chest - clear bilaterally Cor - RRR Abdomen - soft, mild distension; wounds dry and intact; few BS present; drain with visous tannish serous output Neuro - alert & oriented, no focal deficits  Lab Results:  Recent Labs    09/02/20 0359 09/04/20 0100  WBC 18.3* 10.4  HGB 11.4* 11.7*  HCT 34.3* 35.1*  PLT 239 316   BMET Recent Labs    09/02/20 0359 09/04/20 0100  NA 131* 134*  K 3.1* 3.4*  CL 98 100  CO2 24 26  GLUCOSE 131* 160*  BUN 6* 7*  CREATININE 0.53 0.56  CALCIUM 7.6* 8.2*   PT/INR No results for input(s): LABPROT, INR in the last 72 hours. Comprehensive Metabolic Panel:    Component Value Date/Time   NA 134 (L)  09/04/2020 0100   NA 131 (L) 09/02/2020 0359   NA 138 10/29/2018 1006   NA 141 07/18/2018 0845   K 3.4 (L) 09/04/2020 0100   K 3.1 (L) 09/02/2020 0359   CL 100 09/04/2020 0100   CL 98 09/02/2020 0359   CO2 26 09/04/2020 0100   CO2 24 09/02/2020 0359   BUN 7 (L) 09/04/2020 0100   BUN 6 (L) 09/02/2020 0359   BUN 12 10/29/2018 1006   BUN 10 07/18/2018 0845   CREATININE 0.56 09/04/2020 0100   CREATININE 0.53 09/02/2020 0359   CREATININE 0.67 11/16/2014 0846   GLUCOSE 160 (H) 09/04/2020 0100   GLUCOSE 131 (H) 09/02/2020 0359   CALCIUM 8.2 (L) 09/04/2020 0100   CALCIUM 7.6 (L) 09/02/2020 0359   AST 17 04/19/2020 1634   AST 20 04/11/2019 0924   ALT 8 04/19/2020 1634   ALT 11 04/11/2019 0924   ALKPHOS 55 04/19/2020 1634   ALKPHOS 40 04/11/2019 0924   BILITOT 0.8 04/19/2020 1634   BILITOT 0.8 04/11/2019 0924   BILITOT 0.5 02/03/2019 1017   BILITOT 0.5 10/29/2018 1006   PROT 7.2 04/19/2020 1634   PROT 6.8 04/11/2019 0924   PROT 6.6 02/03/2019 1017   PROT 6.4 10/29/2018 1006   ALBUMIN 4.5 04/19/2020 1634   ALBUMIN 3.9 04/11/2019 0924  ALBUMIN 4.3 02/03/2019 1017   ALBUMIN 4.2 10/29/2018 1006    Studies/Results: No results found.    [name] 09/04/2020   Patient ID: Renee [name], female   DOB: 1940-10-10, 80 y.o.   MRN: [id number]

## 2020-09-05 LAB — GLUCOSE, CAPILLARY
Glucose-Capillary: 129 mg/dL — ABNORMAL HIGH (ref 70–99)
Glucose-Capillary: 146 mg/dL — ABNORMAL HIGH (ref 70–99)
Glucose-Capillary: 131 mg/dL — ABNORMAL HIGH (ref 70–99)
Glucose-Capillary: 156 mg/dL — ABNORMAL HIGH (ref 70–99)

## 2020-09-05 NOTE — Plan of Care (Signed)
?  Problem: Education: ?Goal: Knowledge of General Education information will improve ?Description: Including pain rating scale, medication(s)/side effects and non-pharmacologic comfort measures ?Outcome: Progressing ?  ?Problem: Health Behavior/Discharge Planning: ?Goal: Ability to manage health-related needs will improve ?Outcome: Progressing ?  ?Problem: Clinical Measurements: ?Goal: Respiratory complications will improve ?Outcome: Progressing ?  ?Problem: Clinical Measurements: ?Goal: Cardiovascular complication will be avoided ?Outcome: Progressing ?  ?Problem: Activity: ?Goal: Risk for activity intolerance will decrease ?Outcome: Progressing ?  ?

## 2020-09-05 NOTE — Plan of Care (Signed)

## 2020-09-05 NOTE — Progress Notes (Signed)
40 cc output for JP drain, yellow/brown color.

## 2020-09-05 NOTE — Progress Notes (Signed)
Assessment & Plan: POD#6 - s/p laparoscopic distal pancreatectomy and splenectomy - Dr. Zenia Resides - Continue soft diet as tolerated - Bowel regimen: colace and miralax - Continue home metoprolol and aspirin - Ambulate, pulmonary toilet, IS - Splenectomy vaccines prior to discharge - monitor drain output - slightly decreased viscous tan fluid this AM   - VTE: lovenox, SCDs   Encouraged OOB, ambulation.  Nausea better.  Hopefully increase po intake today and ready for discharge home tomorrow.        [name], MD       [facility]       Office: [phone]   Chief Complaint: Pancreatic mass  Subjective: Patient appears improved this AM.  No nausea.  Limited ambulation.  Objective: Vital signs in last 24 hours: Temp:  [98.2 F (36.8 C)-98.7 F (37.1 C)] 98.3 F (36.8 C) (08/07 0837) Pulse Rate:  [98-108] 108 (08/07 0837) Resp:  [15-18] 16 (08/07 0837) BP: (134-165)/(57-71) 142/57 (08/07 0837) SpO2:  [93 %-98 %] 93 % (08/07 0837) Last BM Date: 09/04/20  Intake/Output from previous day: 08/06 0701 - 08/07 0700 In: 420 [P.O.:420] Out: 39 [Drains:35; Stool:4] Intake/Output this shift: No intake/output data recorded.  Physical Exam: HEENT - sclerae clear, mucous membranes moist Neck - soft Chest - clear bilaterally Cor - RRR Abdomen - softer, minimal distension; wounds dry and intact; drain with less tan fluid Ext - no edema, non-tender Neuro - alert & oriented, no focal deficits  Lab Results:  Recent Labs    09/04/20 0100  WBC 10.4  HGB 11.7*  HCT 35.1*  PLT 316   BMET Recent Labs    09/04/20 0100  NA 134*  K 3.4*  CL 100  CO2 26  GLUCOSE 160*  BUN 7*  CREATININE 0.56  CALCIUM 8.2*   PT/INR No results for input(s): LABPROT, INR in the last 72 hours. Comprehensive Metabolic Panel:    Component Value Date/Time   NA 134 (L) 09/04/2020 0100   NA 131 (L) 09/02/2020 0359   NA 138 10/29/2018 1006   NA 141 07/18/2018 0845   K  3.4 (L) 09/04/2020 0100   K 3.1 (L) 09/02/2020 0359   CL 100 09/04/2020 0100   CL 98 09/02/2020 0359   CO2 26 09/04/2020 0100   CO2 24 09/02/2020 0359   BUN 7 (L) 09/04/2020 0100   BUN 6 (L) 09/02/2020 0359   BUN 12 10/29/2018 1006   BUN 10 07/18/2018 0845   CREATININE 0.56 09/04/2020 0100   CREATININE 0.53 09/02/2020 0359   CREATININE 0.67 11/16/2014 0846   GLUCOSE 160 (H) 09/04/2020 0100   GLUCOSE 131 (H) 09/02/2020 0359   CALCIUM 8.2 (L) 09/04/2020 0100   CALCIUM 7.6 (L) 09/02/2020 0359   AST 17 04/19/2020 1634   AST 20 04/11/2019 0924   ALT 8 04/19/2020 1634   ALT 11 04/11/2019 0924   ALKPHOS 55 04/19/2020 1634   ALKPHOS 40 04/11/2019 0924   BILITOT 0.8 04/19/2020 1634   BILITOT 0.8 04/11/2019 0924   BILITOT 0.5 02/03/2019 1017   BILITOT 0.5 10/29/2018 1006   PROT 7.2 04/19/2020 1634   PROT 6.8 04/11/2019 0924   PROT 6.6 02/03/2019 1017   PROT 6.4 10/29/2018 1006   ALBUMIN 4.5 04/19/2020 1634   ALBUMIN 3.9 04/11/2019 0924   ALBUMIN 4.3 02/03/2019 1017   ALBUMIN 4.2 10/29/2018 1006    Studies/Results: No results found.    [name] 09/05/2020   Patient ID: Renee [name],  female   DOB: Apr 23, 1940, 80 y.o.   MRN: [id number]

## 2020-09-06 LAB — CREATININE, SERUM
Creatinine, Ser: 0.5 mg/dL (ref 0.44–1.00)
GFR, Estimated: >60 mL/min (ref >60)

## 2020-09-06 LAB — GLUCOSE, CAPILLARY
Glucose-Capillary: 152 mg/dL — ABNORMAL HIGH (ref 70–99)
Glucose-Capillary: 150 mg/dL — ABNORMAL HIGH (ref 70–99)
Glucose-Capillary: 136 mg/dL — ABNORMAL HIGH (ref 70–99)
Glucose-Capillary: 147 mg/dL — ABNORMAL HIGH (ref 70–99)

## 2020-09-06 LAB — AMYLASE, PLEURAL OR PERITONEAL FLUID: Amylase, Fluid: 7133 U/L

## 2020-09-06 MED ORDER — METOCLOPRAMIDE HCL 5 MG/ML IJ SOLN
5.0000 mg | Freq: Three times a day (TID) | INTRAMUSCULAR | Status: DC
  Administered 2020-09-06 – 2020-09-07 (×4): 5 mg via INTRAVENOUS
  Filled 2020-09-06 (×4): qty 2

## 2020-09-06 NOTE — Progress Notes (Signed)
Physical Therapy Treatment Patient Details Name: Renee [name] MRN: [id number] DOB: 1940-10-04 Today's Date: 09/06/2020    History of Present Illness Pt is a 80 y.o. 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 Significant PMH: COPD, DVT.    PT Comments    The pt was eager to participate in session this morning, and continues to make great progress with OOB tolerance and stability. The pt was able to complete a full lap around the unit with no AD or assist, without needing standing or sitting rest break this morning. The pt's vitals remained mostly steady other than slight HR increase to 130s with exertion. The pt continues to report SOB with activity (baseline for her), but was able to maintain good gait speed and activity in room without additional breaks at this time. The pt is safe from mobility standpoint to return home once medically cleared.     Follow Up Recommendations  No PT follow up;Supervision for mobility/OOB     Equipment Recommendations  None recommended by PT    Recommendations for Other Services       Precautions / Restrictions Precautions Precautions: Fall;Other (comment) Precaution Comments: JP bulb Restrictions Weight Bearing Restrictions: No    Mobility  Bed Mobility Overal bed mobility: Modified Independent Bed Mobility: Rolling;Sidelying to Sit Rolling: Modified independent (Device/Increase time) Sidelying to sit: Modified independent (Device/Increase time)       General bed mobility comments: increased time and use of bed rail, no physical assist    Transfers Overall transfer level: Needs assistance Equipment used: None Transfers: Sit to/from Stand Sit to Stand: Min guard         General transfer comment: minG for safety, but pt able to power up and steady without need for UE support  Ambulation/Gait Ambulation/Gait assistance: Supervision Gait Distance (Feet): 250 Feet Assistive device:  None Gait Pattern/deviations: Step-through pattern;Decreased stride length;Trunk flexed Gait velocity: 0.8 m/s Gait velocity interpretation: 1.31 - 2.62 ft/sec, indicative of limited community ambulator General Gait Details: slight progressive trunk flexion due to abdominal pain. pt able to demo good improvement in speed as well as manage multiple short bursts of increased speed with no LOB or changes in pain. reports SOB with activity which is baseline for her      Balance Overall balance assessment: Mild deficits observed, not formally tested                                          Cognition Arousal/Alertness: Awake/alert Behavior During Therapy: WFL for tasks assessed/performed Overall Cognitive Status: Within Functional Limits for tasks assessed                                        Exercises General Exercises - Lower Extremity Long Arc Quad: Strengthening;Both;10 reps;Seated (with legs crossed for increased resistance) Toe Raises: AROM;Both;10 reps;Seated Heel Raises: AROM;Both;10 reps;Seated Other Exercises Other Exercises: Shoulder press (overhead) with BW x10 Other Exercises: shoulder flexion paired with extension and scapular retraction    General Comments General comments (skin integrity, edema, etc.): HR elevated to 130s with ambulation. SpO2 steady in 90s      Pertinent Vitals/Pain Pain Assessment: Faces Faces Pain Scale: Hurts little more Pain Location: incisional site Pain Descriptors / Indicators: Guarding;Grimacing Pain Intervention(s): Limited activity within  patient's tolerance;Monitored during session;Repositioned     PT Goals (current goals can now be found in the care plan section) Acute Rehab PT Goals Patient Stated Goal: to feel better PT Goal Formulation: With patient Time For Goal Achievement: 09/14/20 Potential to Achieve Goals: Good Progress towards PT goals: Progressing toward goals    Frequency    Min  3X/week      PT Plan Current plan remains appropriate       AM-PAC PT "6 Clicks" Mobility   Outcome Measure  Help needed turning from your back to your side while in a flat bed without using bedrails?: None Help needed moving from lying on your back to sitting on the side of a flat bed without using bedrails?: A Little Help needed moving to and from a bed to a chair (including a wheelchair)?: A Little Help needed standing up from a chair using your arms (e.g., wheelchair or bedside chair)?: A Little Help needed to walk in hospital room?: A Little Help needed climbing 3-5 steps with a railing? : A Little 6 Click Score: 19    End of Session Equipment Utilized During Treatment: Gait belt Activity Tolerance: Patient tolerated treatment well Patient left: with call bell/phone within reach;in chair;with nursing/sitter in room Nurse Communication: Mobility status PT Visit Diagnosis: Difficulty in walking, not elsewhere classified (R26.2);Pain;Unsteadiness on feet (R26.81);Other abnormalities of gait and mobility (R26.89)     Time: 9518-8416 PT Time Calculation (min) (ACUTE ONLY): 24 min  Charges:  $Gait Training: 8-22 mins $Therapeutic Exercise: 8-22 mins                     Inocencio Homes, PT, DPT   Acute Rehabilitation Department Pager #: [phone]   Otho Bellows 09/06/2020, 10:49 AM

## 2020-09-06 NOTE — Progress Notes (Signed)
    7 Days Post-Op  Subjective: Vitals stable. Patient still having nausea this morning. Reports she is able to eat small amounts of each meal. Having bowel movements.   Objective: Vital signs in last 24 hours: Temp:  [98.2 F (36.8 C)-99.3 F (37.4 C)] 98.3 F (36.8 C) (08/08 0801) Pulse Rate:  [95-109] 106 (08/08 0801) Resp:  [15-18] 18 (08/08 0801) BP: (135-158)/(57-72) 152/64 (08/08 0801) SpO2:  [93 %-97 %] 95 % (08/08 0801) Last BM Date: 09/05/20  Intake/Output from previous day: 08/07 0701 - 08/08 0700 In: 420 [P.O.:420] Out: 60 [Drains:60] Intake/Output this shift: No intake/output data recorded.  PE: General: resting comfortably, NAD Neuro: alert and oriented, no focal deficits Resp: normal work of breathing on room air Abdomen: soft, nondistended, nontender. Incisions clean and dry. Drain output much thinner than prior, very light brown in color and clear. Extremities: warm and well-perfused   Lab Results:  Recent Labs    09/04/20 0100  WBC 10.4  HGB 11.7*  HCT 35.1*  PLT 316   BMET Recent Labs    09/04/20 0100 09/06/20 0552  NA 134*  --   K 3.4*  --   CL 100  --   CO2 26  --   GLUCOSE 160*  --   BUN 7*  --   CREATININE 0.56 0.50  CALCIUM 8.2*  --    PT/INR No results for input(s): LABPROT, INR in the last 72 hours. CMP     Component Value Date/Time   NA 134 (L) 09/04/2020 0100   NA 138 10/29/2018 1006   K 3.4 (L) 09/04/2020 0100   CL 100 09/04/2020 0100   CO2 26 09/04/2020 0100   GLUCOSE 160 (H) 09/04/2020 0100   BUN 7 (L) 09/04/2020 0100   BUN 12 10/29/2018 1006   CREATININE 0.50 09/06/2020 0552   CREATININE 0.67 11/16/2014 0846   CALCIUM 8.2 (L) 09/04/2020 0100   PROT 7.2 04/19/2020 1634   PROT 6.6 02/03/2019 1017   ALBUMIN 4.5 04/19/2020 1634   ALBUMIN 4.3 02/03/2019 1017   AST 17 04/19/2020 1634   ALT 8 04/19/2020 1634   ALKPHOS 55 04/19/2020 1634   BILITOT 0.8 04/19/2020 1634   BILITOT 0.5 02/03/2019 1017   GFRNONAA >60  09/06/2020 0552   GFRAA >60 04/11/2019 0924   Lipase     Component Value Date/Time   LIPASE 28.0 04/19/2020 1634        Assessment/Plan 80 yo female with 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 - Continue soft diet - Bowel regimen: colace and miralax - Continue home metoprolol and aspirin - Ambulate, pulmonary toilet, IS - Multimodal pain control - Begin low-dose reglan for nausea (5mg  q8h) - nausea may be related to delayed gastric emptying - Splenectomy vaccines prior to discharge - Will send a drain amylase again today now that output is less viscous - VTE: lovenox, SCDs - Dispo: inpatient, med-surg. Patient feels almost ready to go home. Will check drain amylase today and treat nausea, and tentatively plan for discharge home tomorrow if nausea improving. Drain plan pending amylase level.    LOS: 7 days    Michaelle Birks, MD [facility] General, Hepatobiliary and Pancreatic Surgery 09/06/20 8:44 AM

## 2020-09-07 ENCOUNTER — Ambulatory Visit: Admit: 2020-09-07 | Payer: Medicare Other | Admitting: Orthopedic Surgery

## 2020-09-07 DIAGNOSIS — R262 Difficulty in walking, not elsewhere classified: Secondary | ICD-10-CM | POA: Diagnosis present

## 2020-09-07 DIAGNOSIS — M1612 Unilateral primary osteoarthritis, left hip: Secondary | ICD-10-CM | POA: Diagnosis present

## 2020-09-07 DIAGNOSIS — I6529 Occlusion and stenosis of unspecified carotid artery: Secondary | ICD-10-CM | POA: Diagnosis present

## 2020-09-07 DIAGNOSIS — I4510 Unspecified right bundle-branch block: Secondary | ICD-10-CM | POA: Diagnosis present

## 2020-09-07 DIAGNOSIS — I10 Essential (primary) hypertension: Secondary | ICD-10-CM | POA: Diagnosis present

## 2020-09-07 DIAGNOSIS — M545 Low back pain, unspecified: Secondary | ICD-10-CM | POA: Diagnosis present

## 2020-09-07 DIAGNOSIS — M25552 Pain in left hip: Secondary | ICD-10-CM | POA: Diagnosis present

## 2020-09-07 DIAGNOSIS — R918 Other nonspecific abnormal finding of lung field: Secondary | ICD-10-CM | POA: Diagnosis present

## 2020-09-07 DIAGNOSIS — K219 Gastro-esophageal reflux disease without esophagitis: Secondary | ICD-10-CM | POA: Diagnosis present

## 2020-09-07 LAB — GLUCOSE, CAPILLARY: Glucose-Capillary: 143 mg/dL — ABNORMAL HIGH (ref 70–99)

## 2020-09-07 SURGERY — ARTHROPLASTY, HIP, TOTAL, ANTERIOR APPROACH
Anesthesia: Choice | Site: Hip | Laterality: Left

## 2020-09-07 MED ORDER — TRAMADOL HCL 50 MG PO TABS: 50.0000 mg | ORAL_TABLET | Freq: Four times a day (QID) | ORAL | 0 refills | Status: DC | PRN

## 2020-09-07 MED ORDER — MENINGOCOCCAL VAC B (OMV) IM SUSY
0.5000 mL | PREFILLED_SYRINGE | Freq: Once | INTRAMUSCULAR | Status: DC
  Filled 2020-09-07 (×2): qty 0.5

## 2020-09-07 MED ORDER — METOCLOPRAMIDE HCL 5 MG PO TABS: 5.0000 mg | ORAL_TABLET | Freq: Three times a day (TID) | ORAL | 0 refills | Status: DC | PRN

## 2020-09-07 NOTE — Progress Notes (Signed)
[facility] to be D/C'd  per MD order.  Discussed with the patient and all questions fully answered.  VSS, Skin clean, dry and intact without evidence of skin break down, no evidence of skin tears noted.  IV catheter discontinued intact. Site without signs and symptoms of complications. Dressing and pressure applied.  An After Visit Summary was printed and given to the patient. Patient received prescription.  D/c education completed with patient/family including follow up instructions, medication list, d/c activities limitations if indicated, with other d/c instructions as indicated by MD - patient able to verbalize understanding, all questions fully answered.   Patient instructed to return to ED, call 911, or call MD for any changes in condition.   Patient to be escorted via [place], and D/C home via private auto.

## 2020-09-07 NOTE — Progress Notes (Signed)
Nausea improved today. Appetite remains limited but patient is tolerating PO with no nausea after starting reglan yesterday. Ambulated around the unit with PT. Afebrile, vitals stable. Drain amylase elevated at 7000. Will discharge home today with drain in place. Patient will follow up in clinic next week for a drain check. She was instructed to call if she has any issues or concerns before then. Will discharge on low-dose reglan prn, patient instructed not to take any other antiemetics while on reglan. Splenectomy vaccines to be given prior to discharge.

## 2020-09-08 LAB — BPAM RBC
Blood Product Expiration Date: 202208082359
Blood Product Expiration Date: 202208222359
ISSUE DATE / TIME: 202208060954
Unit Type and Rh: 1700
Unit Type and Rh: 1700

## 2020-09-08 LAB — TYPE AND SCREEN
ABO/RH(D): B NEG
Antibody Screen: NEGATIVE
Unit division: 0
Unit division: 0

## 2020-09-08 NOTE — Discharge Summary (Signed)
Physician Discharge Summary   Patient ID: Renee [name] [id number] 80 y.o. 21-Feb-1940  Admit date: 08/30/2020  Discharge date and time: 09/07/2020 12:39 PM   Admitting Physician: Dwan Bolt, MD   Discharge Physician: Michaelle Birks, MD  Admission Diagnoses: Pancreatic mass [K86.89] Pancreatic cyst [K86.2]  Discharge Diagnoses: Pancreatic IPMN, Grade A pancreatic fistula  Admission Condition: good  Discharged Condition: stable  Indication for Admission: Renee [name] is an 80 yo female who presented with an enlarging cyst in the tail of the pancreas with associated change in caliber of the pancreatic duct with focal dilation. EUS with FNA did not show any malignant cells, however given the increase in size of the cyst and suspicious features on imaging, the decision was made to proceed with surgical resection.  Hospital Course: The patient was taken to the OR on 8/1 for a laparoscopic distal pancreatectomy and splenectomy. For further details of the procedure please see separately dictated operative note. Postoperatively she was admitted to the surgical progressive care unit in stable condition. Her NG tube and foley were removed on POD1 and she was advanced to a clear liquid diet. Her diet was slowly advanced over the next few days to a soft diet by POD3. She had some nausea but no vomiting. She began passing flatus and on POD5 had a bowel movement. She worked with physical therapy throughout her hospitalization and was able to progress to ambulating without assistance. Her surgical drain output was milky in character and the drain amylase was elevated at 7000, thus her drain was not removed prior to discharge. Her nausea improved and she was able to tolerate PO intake. On the morning of POD8, she was ambulatory, having bowel function, and pain was well-controlled. Her drain output was about 30-50 ml/day. She was examined and deemed appropriate for discharge home. She will follow up in  clinic  in 1 week for a drain check.  Patient did not receive splenectomy vaccines as planned prior to discharge. These will be given as an outpatient.  Consults: None  Significant Diagnostic Studies: Surgical Pathology: A. 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 Incisions clean and dry with minimal surrounding ecchymoses. LUQ JP with thin brown fluid. Extremities: warm and well-perfused  Disposition: Discharge disposition: 01-Home or Self Care      Patient Instructions:  Allergies as of 09/07/2020       Reactions   Aspirin Nausea Only   Patient able to take lower dose, Aspirin 81mg     Atorvastatin Other (See Comments)   Pt reports causes "muscle aches."   Codeine Itching   Mood changes, can take percocet   Ibuprofen Hives   Blisters    Lisinopril Cough        Medication List     STOP taking these medications    promethazine 12.5 MG tablet Commonly known as: PHENERGAN   scopolamine 1 MG/3DAYS Commonly known as: TRANSDERM-SCOP       TAKE these medications  acetaminophen 325 MG tablet Commonly known as: TYLENOL Take 2 tablets (650 mg total) by mouth every 6 (six) hours as needed for mild pain or headache.   aspirin EC 81 MG tablet Take 1 tablet (81 mg total) by mouth daily.   CALCIUM 1200 PO Take 1,200 mg by mouth daily.    cyclobenzaprine 10 MG tablet Commonly known as: FLEXERIL Take 10 mg by mouth at bedtime as needed for muscle spasms.   docusate sodium 100 MG capsule Commonly known as: COLACE Take 1 capsule (100 mg total) by mouth 2 (two) times daily.   esomeprazole 40 MG capsule Commonly known as: NexIUM Take 1 capsule (40 mg total) by mouth daily.   metoCLOPramide 5 MG tablet Commonly known as: Reglan Take 1 tablet (5 mg total) by mouth every 8 (eight) hours as needed for nausea or vomiting.   metoprolol tartrate 50 MG tablet Commonly known as: LOPRESSOR TAKE 1 TABLET(50 MG) BY MOUTH TWICE DAILY What changed: See the new instructions.   multivitamin tablet Take 1 tablet by mouth daily.   polyethylene glycol 17 g packet Commonly known as: MIRALAX / GLYCOLAX Take 17 g by mouth daily.   Prolia 60 MG/ML Sosy injection Generic drug: denosumab Inject 60 mg into the skin every 6 (six) months.   Repatha 140 MG/ML Sosy Generic drug: Evolocumab Inject 140 mg into the skin every 14 (fourteen) days.   traMADol 50 MG tablet Commonly known as: ULTRAM Take 1 tablet (50 mg total) by mouth every 6 (six) hours as needed for severe pain.   VITAMIN D PO Take 1,000 Units by mouth daily.       Activity: no lifting, driving, or strenuous exercise for 3 weeks Diet: regular diet Wound Care: keep wound clean and dry  Follow-up with Dr. Zenia Resides in 1 week.  Signed: Dwan Bolt 09/08/2020 1:18 PM

## 2020-09-22 DIAGNOSIS — Z7689 Persons encountering health services in other specified circumstances: Secondary | ICD-10-CM | POA: Diagnosis not present

## 2020-09-22 DIAGNOSIS — Z90411 Acquired partial absence of pancreas: Secondary | ICD-10-CM | POA: Diagnosis not present

## 2020-09-24 ENCOUNTER — Other Ambulatory Visit: Payer: Self-pay

## 2020-09-24 ENCOUNTER — Emergency Department (HOSPITAL_COMMUNITY): Payer: Medicare Other

## 2020-09-24 ENCOUNTER — Encounter (HOSPITAL_COMMUNITY): Payer: Self-pay

## 2020-09-24 ENCOUNTER — Inpatient Hospital Stay (HOSPITAL_COMMUNITY)
Admission: EM | Admit: 2020-09-24 | Discharge: 2020-09-29 | DRG: 871 | Disposition: A | Discharge status: Skilled Nursing Facility | Payer: Medicare Other | Attending: Internal Medicine | Admitting: Internal Medicine

## 2020-09-24 DIAGNOSIS — N390 Urinary tract infection, site not specified: Secondary | ICD-10-CM | POA: Diagnosis present

## 2020-09-24 DIAGNOSIS — I10 Essential (primary) hypertension: Secondary | ICD-10-CM | POA: Diagnosis not present

## 2020-09-24 DIAGNOSIS — A419 Sepsis, unspecified organism: Secondary | ICD-10-CM | POA: Diagnosis not present

## 2020-09-24 DIAGNOSIS — J90 Pleural effusion, not elsewhere classified: Secondary | ICD-10-CM | POA: Diagnosis not present

## 2020-09-24 DIAGNOSIS — M6281 Muscle weakness (generalized): Secondary | ICD-10-CM | POA: Diagnosis not present

## 2020-09-24 DIAGNOSIS — E8809 Other disorders of plasma-protein metabolism, not elsewhere classified: Secondary | ICD-10-CM | POA: Diagnosis present

## 2020-09-24 DIAGNOSIS — R0600 Dyspnea, unspecified: Secondary | ICD-10-CM

## 2020-09-24 DIAGNOSIS — R000 Tachycardia, unspecified: Secondary | ICD-10-CM | POA: Diagnosis present

## 2020-09-24 DIAGNOSIS — M797 Fibromyalgia: Secondary | ICD-10-CM | POA: Diagnosis not present

## 2020-09-24 DIAGNOSIS — Z7982 Long term (current) use of aspirin: Secondary | ICD-10-CM

## 2020-09-24 DIAGNOSIS — M810 Age-related osteoporosis without current pathological fracture: Secondary | ICD-10-CM | POA: Diagnosis not present

## 2020-09-24 DIAGNOSIS — R739 Hyperglycemia, unspecified: Secondary | ICD-10-CM

## 2020-09-24 DIAGNOSIS — Z20822 Contact with and (suspected) exposure to covid-19: Secondary | ICD-10-CM | POA: Diagnosis present

## 2020-09-24 DIAGNOSIS — E871 Hypo-osmolality and hyponatremia: Secondary | ICD-10-CM | POA: Diagnosis not present

## 2020-09-24 DIAGNOSIS — Z811 Family history of alcohol abuse and dependence: Secondary | ICD-10-CM | POA: Diagnosis not present

## 2020-09-24 DIAGNOSIS — T8149XA Infection following a procedure, other surgical site, initial encounter: Secondary | ICD-10-CM | POA: Diagnosis not present

## 2020-09-24 DIAGNOSIS — R059 Cough, unspecified: Secondary | ICD-10-CM | POA: Diagnosis not present

## 2020-09-24 DIAGNOSIS — M160 Bilateral primary osteoarthritis of hip: Secondary | ICD-10-CM | POA: Diagnosis not present

## 2020-09-24 DIAGNOSIS — F411 Generalized anxiety disorder: Secondary | ICD-10-CM | POA: Diagnosis present

## 2020-09-24 DIAGNOSIS — Z9013 Acquired absence of bilateral breasts and nipples: Secondary | ICD-10-CM

## 2020-09-24 DIAGNOSIS — R0602 Shortness of breath: Secondary | ICD-10-CM | POA: Diagnosis not present

## 2020-09-24 DIAGNOSIS — D75839 Thrombocytosis, unspecified: Secondary | ICD-10-CM | POA: Diagnosis not present

## 2020-09-24 DIAGNOSIS — Z87891 Personal history of nicotine dependence: Secondary | ICD-10-CM

## 2020-09-24 DIAGNOSIS — J189 Pneumonia, unspecified organism: Secondary | ICD-10-CM | POA: Diagnosis present

## 2020-09-24 DIAGNOSIS — D539 Nutritional anemia, unspecified: Secondary | ICD-10-CM | POA: Diagnosis not present

## 2020-09-24 DIAGNOSIS — A4101 Sepsis due to Methicillin susceptible Staphylococcus aureus: Secondary | ICD-10-CM | POA: Diagnosis not present

## 2020-09-24 DIAGNOSIS — R0689 Other abnormalities of breathing: Secondary | ICD-10-CM | POA: Diagnosis not present

## 2020-09-24 DIAGNOSIS — Z86718 Personal history of other venous thrombosis and embolism: Secondary | ICD-10-CM

## 2020-09-24 DIAGNOSIS — Z90411 Acquired partial absence of pancreas: Secondary | ICD-10-CM

## 2020-09-24 DIAGNOSIS — Z825 Family history of asthma and other chronic lower respiratory diseases: Secondary | ICD-10-CM

## 2020-09-24 DIAGNOSIS — Z820 Family history of epilepsy and other diseases of the nervous system: Secondary | ICD-10-CM

## 2020-09-24 DIAGNOSIS — K219 Gastro-esophageal reflux disease without esophagitis: Secondary | ICD-10-CM | POA: Diagnosis present

## 2020-09-24 DIAGNOSIS — E872 Acidosis, unspecified: Secondary | ICD-10-CM

## 2020-09-24 DIAGNOSIS — Z853 Personal history of malignant neoplasm of breast: Secondary | ICD-10-CM

## 2020-09-24 DIAGNOSIS — R9431 Abnormal electrocardiogram [ECG] [EKG]: Secondary | ICD-10-CM

## 2020-09-24 DIAGNOSIS — B999 Unspecified infectious disease: Secondary | ICD-10-CM | POA: Diagnosis not present

## 2020-09-24 DIAGNOSIS — B965 Pseudomonas (aeruginosa) (mallei) (pseudomallei) as the cause of diseases classified elsewhere: Secondary | ICD-10-CM | POA: Diagnosis present

## 2020-09-24 DIAGNOSIS — E785 Hyperlipidemia, unspecified: Secondary | ICD-10-CM | POA: Diagnosis not present

## 2020-09-24 DIAGNOSIS — J449 Chronic obstructive pulmonary disease, unspecified: Secondary | ICD-10-CM | POA: Diagnosis not present

## 2020-09-24 DIAGNOSIS — R079 Chest pain, unspecified: Secondary | ICD-10-CM | POA: Diagnosis not present

## 2020-09-24 DIAGNOSIS — R109 Unspecified abdominal pain: Secondary | ICD-10-CM | POA: Diagnosis not present

## 2020-09-24 DIAGNOSIS — J440 Chronic obstructive pulmonary disease with acute lower respiratory infection: Secondary | ICD-10-CM | POA: Diagnosis not present

## 2020-09-24 DIAGNOSIS — Z9081 Acquired absence of spleen: Secondary | ICD-10-CM

## 2020-09-24 DIAGNOSIS — E876 Hypokalemia: Secondary | ICD-10-CM | POA: Diagnosis not present

## 2020-09-24 DIAGNOSIS — Z934 Other artificial openings of gastrointestinal tract status: Secondary | ICD-10-CM | POA: Diagnosis not present

## 2020-09-24 DIAGNOSIS — Z743 Need for continuous supervision: Secondary | ICD-10-CM | POA: Diagnosis not present

## 2020-09-24 DIAGNOSIS — R1115 Cyclical vomiting syndrome unrelated to migraine: Secondary | ICD-10-CM

## 2020-09-24 DIAGNOSIS — E440 Moderate protein-calorie malnutrition: Secondary | ICD-10-CM | POA: Diagnosis not present

## 2020-09-24 DIAGNOSIS — J811 Chronic pulmonary edema: Secondary | ICD-10-CM | POA: Diagnosis not present

## 2020-09-24 DIAGNOSIS — B954 Other streptococcus as the cause of diseases classified elsewhere: Secondary | ICD-10-CM | POA: Diagnosis present

## 2020-09-24 DIAGNOSIS — D72829 Elevated white blood cell count, unspecified: Secondary | ICD-10-CM

## 2020-09-24 DIAGNOSIS — Z85828 Personal history of other malignant neoplasm of skin: Secondary | ICD-10-CM

## 2020-09-24 DIAGNOSIS — R531 Weakness: Secondary | ICD-10-CM | POA: Diagnosis not present

## 2020-09-24 DIAGNOSIS — J9811 Atelectasis: Secondary | ICD-10-CM | POA: Diagnosis not present

## 2020-09-24 DIAGNOSIS — I499 Cardiac arrhythmia, unspecified: Secondary | ICD-10-CM | POA: Diagnosis not present

## 2020-09-24 DIAGNOSIS — Z8249 Family history of ischemic heart disease and other diseases of the circulatory system: Secondary | ICD-10-CM | POA: Diagnosis not present

## 2020-09-24 DIAGNOSIS — E1165 Type 2 diabetes mellitus with hyperglycemia: Secondary | ICD-10-CM | POA: Diagnosis present

## 2020-09-24 DIAGNOSIS — Z79899 Other long term (current) drug therapy: Secondary | ICD-10-CM

## 2020-09-24 DIAGNOSIS — R2689 Other abnormalities of gait and mobility: Secondary | ICD-10-CM | POA: Diagnosis not present

## 2020-09-24 DIAGNOSIS — R509 Fever, unspecified: Secondary | ICD-10-CM | POA: Diagnosis not present

## 2020-09-24 DIAGNOSIS — I444 Left anterior fascicular block: Secondary | ICD-10-CM | POA: Diagnosis present

## 2020-09-24 LAB — CBC WITH DIFFERENTIAL/PLATELET
Abs Immature Granulocytes: 0.4 10*3/uL — ABNORMAL HIGH (ref 0.00–0.07)
Basophils Absolute: 0.1 10*3/uL (ref 0.0–0.1)
Basophils Relative: 0 %
Eosinophils Absolute: 0 10*3/uL (ref 0.0–0.5)
Eosinophils Relative: 0 %
HCT: 39.6 % (ref 36.0–46.0)
Hemoglobin: 12.9 g/dL (ref 12.0–15.0)
Immature Granulocytes: 1 %
Lymphocytes Relative: 3 %
Lymphs Abs: 0.7 10*3/uL (ref 0.7–4.0)
MCH: 32.8 pg (ref 26.0–34.0)
MCHC: 32.6 g/dL (ref 30.0–36.0)
MCV: 100.8 fL — ABNORMAL HIGH (ref 80.0–100.0)
Monocytes Absolute: 2.7 10*3/uL — ABNORMAL HIGH (ref 0.1–1.0)
Monocytes Relative: 10 %
Neutro Abs: 23.9 10*3/uL — ABNORMAL HIGH (ref 1.7–7.7)
Neutrophils Relative %: 86 %
Platelets: 509 10*3/uL — ABNORMAL HIGH (ref 150–400)
RBC: 3.93 MIL/uL (ref 3.87–5.11)
RDW: 11.9 % (ref 11.5–15.5)
WBC: 27.8 10*3/uL — ABNORMAL HIGH (ref 4.0–10.5)
nRBC: 0 % (ref 0.0–0.2)

## 2020-09-24 LAB — TROPONIN I (HIGH SENSITIVITY)
Troponin I (High Sensitivity): 8 ng/L (ref <18)
Troponin I (High Sensitivity): 8 ng/L (ref <18)

## 2020-09-24 LAB — COMPREHENSIVE METABOLIC PANEL
ALT: 12 U/L (ref 0–44)
AST: 23 U/L (ref 15–41)
Albumin: 3 g/dL — ABNORMAL LOW (ref 3.5–5.0)
Alkaline Phosphatase: 91 U/L (ref 38–126)
Anion gap: 10 (ref 5–15)
BUN: 9 mg/dL (ref 8–23)
CO2: 24 mmol/L (ref 22–32)
Calcium: 7.8 mg/dL — ABNORMAL LOW (ref 8.9–10.3)
Chloride: 96 mmol/L — ABNORMAL LOW (ref 98–111)
Creatinine, Ser: 0.62 mg/dL (ref 0.44–1.00)
GFR, Estimated: >60 mL/min (ref >60)
Glucose, Bld: 205 mg/dL — ABNORMAL HIGH (ref 70–99)
Potassium: 3.7 mmol/L (ref 3.5–5.1)
Sodium: 130 mmol/L — ABNORMAL LOW (ref 135–145)
Total Bilirubin: 0.6 mg/dL (ref 0.3–1.2)
Total Protein: 6.6 g/dL (ref 6.5–8.1)

## 2020-09-24 LAB — LACTIC ACID, PLASMA
Lactic Acid, Venous: 2.1 mmol/L (ref 0.5–1.9)
Lactic Acid, Venous: 2.1 mmol/L (ref 0.5–1.9)

## 2020-09-24 LAB — LIPASE, BLOOD: Lipase: 41 U/L (ref 11–51)

## 2020-09-24 MED ORDER — ONDANSETRON HCL 4 MG/2ML IJ SOLN
4.0000 mg | Freq: Once | INTRAMUSCULAR | Status: AC
  Administered 2020-09-24: 4 mg via INTRAVENOUS
  Filled 2020-09-24: qty 2

## 2020-09-24 MED ORDER — MORPHINE SULFATE (PF) 4 MG/ML IV SOLN
4.0000 mg | Freq: Once | INTRAVENOUS | Status: AC
  Administered 2020-09-24: 4 mg via INTRAVENOUS
  Filled 2020-09-24: qty 1

## 2020-09-24 MED ORDER — MORPHINE SULFATE (PF) 2 MG/ML IV SOLN
2.0000 mg | Freq: Once | INTRAVENOUS | Status: AC
  Administered 2020-09-24: 2 mg via INTRAVENOUS
  Filled 2020-09-24: qty 1

## 2020-09-24 MED ORDER — SODIUM CHLORIDE 0.9 % IV SOLN: 2.0000 g | Freq: Once | INTRAVENOUS | Status: DC

## 2020-09-24 MED ORDER — VANCOMYCIN HCL IN DEXTROSE 1-5 GM/200ML-% IV SOLN
1000.0000 mg | INTRAVENOUS | Status: DC
Start: 2020-09-25 — End: 2020-09-28
  Administered 2020-09-25 – 2020-09-28 (×4): 1000 mg via INTRAVENOUS
  Filled 2020-09-24 (×4): qty 200

## 2020-09-24 MED ORDER — LACTATED RINGERS IV SOLN
INTRAVENOUS | Status: DC
Start: 2020-09-24 — End: 2020-09-25

## 2020-09-24 MED ORDER — VANCOMYCIN HCL IN DEXTROSE 1-5 GM/200ML-% IV SOLN: 1000.0000 mg | Freq: Once | INTRAVENOUS | Status: DC

## 2020-09-24 MED ORDER — IOHEXOL 350 MG/ML SOLN
75.0000 mL | Freq: Once | INTRAVENOUS | Status: AC | PRN
  Administered 2020-09-24: 75 mL via INTRAVENOUS

## 2020-09-24 MED ORDER — METRONIDAZOLE 500 MG/100ML IV SOLN
500.0000 mg | Freq: Once | INTRAVENOUS | Status: AC
  Administered 2020-09-24: 500 mg via INTRAVENOUS
  Filled 2020-09-24: qty 100

## 2020-09-24 MED ORDER — SODIUM CHLORIDE 0.9 % IV SOLN
2.0000 g | Freq: Two times a day (BID) | INTRAVENOUS | Status: DC
  Administered 2020-09-24 – 2020-09-29 (×10): 2 g via INTRAVENOUS
  Filled 2020-09-24 (×10): qty 2

## 2020-09-24 MED ORDER — VANCOMYCIN HCL 1500 MG/300ML IV SOLN
1500.0000 mg | Freq: Once | INTRAVENOUS | Status: AC
  Administered 2020-09-24: 1500 mg via INTRAVENOUS
  Filled 2020-09-24: qty 300

## 2020-09-24 MED ORDER — ACETAMINOPHEN 325 MG PO TABS
650.0000 mg | ORAL_TABLET | Freq: Once | ORAL | Status: AC
  Administered 2020-09-24: 650 mg via ORAL
  Filled 2020-09-24: qty 2

## 2020-09-24 NOTE — Progress Notes (Signed)
Pharmacy Antibiotic Note  Renee [name] is a 80 y.o. female admitted on 09/24/2020 with sepsis.  Pharmacy has been consulted for cefepime and vancomycin dosing. Patient presents from home with complaints of abdominal pain s/p surgery at [facility] in early August. Patient pt has had abd pain since then, however nausea and vomiting started Wednesday night and still present. Pt saw surgeon for 2nd f/u appt on Wednesday and was sent home with instructions to f/u in a week. Current WBC 27.8, Tmax 100.6, and Scr 0.5 on 8/8. Per consult, provider wants 7 days of therapy for now.   Plan: Cefepime 2G IV Q12H  Vancomycin 1500 MG IV x1 followed by 1000 MG IV Q24H (AUC 526, Scr 1 adjusted for age)  Monitor renal fxn and clinical status  Vancomycin levels as indicated   Height: 5\' 3"  (160 cm) Weight: 74.6 kg (164 lb 7.4 oz) IBW/kg (Calculated) : 52.4  Temp (24hrs), Avg:99.9 F (37.7 C), Min:99.1 F (37.3 C), Max:100.6 F (38.1 C)  Recent Labs  Lab 09/24/20 2103  WBC 27.8*    Estimated Creatinine Clearance: 54.3 mL/min (by C-G formula based on SCr of 0.5 mg/dL).    Allergies  Allergen Reactions   Aspirin Nausea Only    Patient able to take lower dose, Aspirin 81mg     Atorvastatin Other (See Comments)    Pt reports causes "muscle aches."   Codeine Itching    Mood changes, can take percocet   Ibuprofen Hives    Blisters    Lisinopril Cough    Antimicrobials this admission: 8/26 Cefepime>> 8/26 Vancomycin>>  Dose adjustments this admission: NA  Microbiology results: 8/26 BCx: sent   Thank you for allowing pharmacy to be a part of this patient's care.  Cephus Slater, PharmD, [facility] Resident [phone] 09/24/2020 9:57 PM

## 2020-09-24 NOTE — ED Provider Notes (Signed)
[facility] EMERGENCY DEPARTMENT Provider Note   CSN: [id number] Arrival date & time: 09/24/20  2010     History Chief Complaint  Patient presents with   Abdominal Pain    S/p surgery    Renee [name] is a 80 y.o. female.  HPI  80 year old female with history of anemia, aortic atherosclerosis, arthritis, blood transfusion, breast cancer, cataracts, COPD, diabetes, diverticulosis, DVT, dysrhythmia, fibromyalgia, GERD, hyperlipidemia, hypertension, who presents to the emergency department today for evaluation of abdominal pain and back pain.  Patient states that she has had abdominal pain for the last several days.  She is had some dry heaves as well but has had normal bowel movements.  She is further complaining of pain to the left back that is worse with inspiration and in certain movements.  She is also complaining of some shortness of breath and a cough.  She has had fevers for the last 24 hours.  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  Past Medical History:  Diagnosis Date   Anemia    history only at age 37 yrs olds, no current problems   Aortic atherosclerosis ([place])    Arthritis    oa   Blood transfusion 1948   Blood transfusion without reported diagnosis    Breast cancer ([place]) 1994   bilateral / HX skin cancer   Carotid atherosclerosis    Cataract    both eyes   Constipation    COPD (chronic obstructive pulmonary disease) ([facility])    no meds/no inaler   Depression    Diabetes ([place])    type 2 - diet controlled, no meds   Disc disease, degenerative, cervical    trouble turing head and neck at times   Diverticulosis    DVT (deep venous thrombosis) ([place]) 2002   left leg   Dyspnea    occasional with exertion   Dysrhythmia    Family history of adverse reaction to anesthesia    Fibromyalgia    GAD (generalized anxiety disorder)    GERD (gastroesophageal reflux disease)    History of vertigo    Hx of skin cancer, basal  cell    Hyperlipidemia    Hypertension    Measles as child   Mumps age 70   Osteoarthritis    Osteoporosis    Pancreatic cyst    Pancreatitis    PONV (postoperative nausea and vomiting)    PONV   Pulmonary nodule    Right renal mass    Seborrheic keratosis    Sinus tachycardia    Sinus tachycardia    Tubular adenoma of colon     Patient Active Problem List   Diagnosis Date Noted   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   Primary localized osteoarthritis of left hip 08/09/2020   RBBB 07/05/2020   Dizziness 07/05/2020   Lumbar pain 10/08/2017   Cervical spine pain 02/21/2017   Carotid stenosis 07/23/2015   Multiple pulmonary nodules 02/11/2015   Essential hypertension 07/08/2013   Sinus tachycardia 01/08/2013   Difficulty walking 11/04/2012   Pain of left hip joint 11/04/2012   Hyperlipidemia 10/15/2012   Dyspnea 02/09/2012    Past Surgical History:  Procedure Laterality Date   ABDOMINAL HYSTERECTOMY     achilles tendon adn 2 bone spurs removed Left 05-2012   APPENDECTOMY     BACK SURGERY  1976   lower   BASAL CELL CARCINOMA EXCISION     bilateral  mastectomy with tramflap reconstruction  1994   BIOPSY  05/13/2020   Procedure: BIOPSY;  Surgeon: Rush Landmark Telford Nab., MD;  Location: [place];  Service: Gastroenterology;;   BREAST REDUCTION SURGERY     CARPAL TUNNEL RELEASE Right 03/2017   COLONOSCOPY     ESOPHAGOGASTRODUODENOSCOPY (EGD) WITH PROPOFOL N/A 05/13/2020   Procedure: ESOPHAGOGASTRODUODENOSCOPY (EGD) WITH PROPOFOL;  Surgeon: Irving Copas., MD;  Location: [place];  Service: Gastroenterology;  Laterality: N/A;   EUS N/A 05/13/2020   Procedure: UPPER ENDOSCOPIC ULTRASOUND (EUS) RADIAL;  Surgeon: Irving Copas., MD;  Location: [place];  Service: Gastroenterology;  Laterality: N/A;   FOOT SURGERY     KNEE ARTHROSCOPY Right 2008   KNEE SURGERY  2002   arthroscopy-bilateral    LAPAROSCOPIC LIVER ULTRASOUND N/A 08/30/2020   Procedure: LAPAROSCOPIC INTRAOPERATIVE ULTRASOUND;  Surgeon: Dwan Bolt, MD;  Location: [place];  Service: General;  Laterality: N/A;   LAPAROSCOPIC SPLENECTOMY N/A 08/30/2020   Procedure: LAPAROSCOPIC SPLENECTOMY;  Surgeon: Dwan Bolt, MD;  Location: [place];  Service: General;  Laterality: N/A;   MASTECTOMY     bilateral   PANCREATECTOMY N/A 08/30/2020   Procedure: LAPAROSCOPIC DISTAL PANCREATECTOMY;  Surgeon: Dwan Bolt, MD;  Location: [place];  Service: General;  Laterality: N/A;   PARTIAL HYSTERECTOMY     POLYPECTOMY     SHOULDER SURGERY Left    TONSILLECTOMY     TOTAL KNEE ARTHROPLASTY Left 10/14/2012   Procedure: LEFT TOTAL KNEE ARTHROPLASTY;  Surgeon: Gearlean Alf, MD;  Location: [facility];  Service: Orthopedics;  Laterality: Left;   TOTAL KNEE ARTHROPLASTY Right 10/20/2013   Procedure: RIGHT TOTAL KNEE ARTHROPLASTY;  Surgeon: Gearlean Alf, MD;  Location: [facility];  Service: Orthopedics;  Laterality: Right;     OB History   No obstetric history on file.     Family History  Problem Relation Age of Onset   Alzheimer's disease Mother    Alcohol abuse Father    Sudden death Father    Liver disease Father        fatty liver disease; alcohol abuse   Other Father        gastritis   Healthy Brother    Alcohol abuse Daughter    COPD Daughter    Healthy Grandchild    Heart disease Maternal Grandfather    CAD Maternal Grandfather    Sudden death Brother        at birth   Unexplained death Daughter    Alcohol abuse Daughter    Healthy Sister    Colon cancer Neg Hx    Colon polyps Neg Hx    Rectal cancer Neg Hx    Stomach cancer Neg Hx    Esophageal cancer Neg Hx     Social History   Tobacco Use   Smoking status: Former    Packs/day: 1.00    Years: 20.00    Pack years: 20.00    Types: Cigarettes    Quit date: 02/11/1992    Years since quitting: 28.6   Smokeless tobacco: Never  Vaping Use   Vaping Use: Never used   Substance Use Topics   Alcohol use: Not Currently    Comment: occasional wine-rare   Drug use: No    Home Medications Prior to Admission medications   Medication Sig Start Date End Date Taking? Authorizing Provider  acetaminophen (TYLENOL) 325 MG tablet Take 2 tablets (650 mg total) by mouth every 6 (six) hours as needed for mild  pain or headache. 09/03/20   Dwan Bolt, MD  aspirin EC 81 MG tablet Take 1 tablet (81 mg total) by mouth daily. 01/18/18   Dorothy Spark, MD  Calcium Carbonate-Vit D-Min (CALCIUM 1200 PO) Take 1,200 mg by mouth daily.    [provider]  cyclobenzaprine (FLEXERIL) 10 MG tablet Take 10 mg by mouth at bedtime as needed for muscle spasms. 06/18/20   [provider]  denosumab (PROLIA) 60 MG/ML SOSY injection Inject 60 mg into the skin every 6 (six) months.  06/10/18   [provider]  docusate sodium (COLACE) 100 MG capsule Take 1 capsule (100 mg total) by mouth 2 (two) times daily. 09/03/20   Dwan Bolt, MD  esomeprazole (NEXIUM) 40 MG capsule Take 1 capsule (40 mg total) by mouth daily. 05/13/20 05/13/21  Mansouraty, Telford Nab., MD  Evolocumab (REPATHA) 140 MG/ML SOSY Inject 140 mg into the skin every 14 (fourteen) days.    [provider]  metoCLOPramide (REGLAN) 5 MG tablet Take 1 tablet (5 mg total) by mouth every 8 (eight) hours as needed for nausea or vomiting. 09/07/20   Dwan Bolt, MD  metoprolol tartrate (LOPRESSOR) 50 MG tablet TAKE 1 TABLET(50 MG) BY MOUTH TWICE DAILY 08/12/20   Richardson Dopp T, PA-C  Multiple Vitamin (MULTIVITAMIN) tablet Take 1 tablet by mouth daily.    [provider]  polyethylene glycol (MIRALAX / GLYCOLAX) 17 g packet Take 17 g by mouth daily. 10/17/19   [provider]  traMADol (ULTRAM) 50 MG tablet Take 1 tablet (50 mg total) by mouth every 6 (six) hours as needed for severe pain. 09/07/20   Dwan Bolt, MD  VITAMIN D PO Take 1,000 Units by mouth daily.    [provider]    Allergies    Aspirin, Atorvastatin, Codeine, Ibuprofen, and Lisinopril  Review of Systems   Review of Systems  Constitutional:  Positive for fever.  HENT:  Negative for ear pain and sore throat.   Eyes:  Negative for pain and visual disturbance.  Respiratory:  Positive for cough and shortness of breath.   Cardiovascular:  Negative for chest pain.  Gastrointestinal:  Positive for abdominal pain, nausea and vomiting. Negative for constipation and diarrhea.  Genitourinary:  Negative for dysuria and hematuria.  Musculoskeletal:  Negative for back pain.  Skin:  Negative for rash.  Neurological:  Negative for headaches.  All other systems reviewed and are negative.  Physical Exam Updated Vital Signs BP 132/65   Pulse (!) 124   Temp (!) 100.6 F (38.1 C)   Resp 17   Ht 5\' 3"  (1.6 m)   Wt 74.6 kg   LMP  (LMP Unknown)   SpO2 100%   BMI 29.13 kg/m   Physical Exam Vitals and nursing note reviewed.  Constitutional:      General: She is not in acute distress.    Appearance: She is well-developed. She is ill-appearing.  HENT:     Head: Normocephalic and atraumatic.  Eyes:     Conjunctiva/sclera: Conjunctivae normal.  Cardiovascular:     Rate and Rhythm: Regular rhythm. Tachycardia present.     Heart sounds: No murmur heard. Pulmonary:     Effort: Pulmonary effort is normal. No respiratory distress.     Breath sounds: Normal breath sounds. No wheezing, rhonchi or rales.  Abdominal:     Palpations: Abdomen is soft.     Tenderness: There is abdominal tenderness in the left upper quadrant  and left lower quadrant. There is guarding.     Comments: Abd drain in place with serous fluid present. There is ttp around the abd at the area of the drain. Abd appears distended,   Musculoskeletal:     Cervical back: Neck supple.  Skin:    General: Skin is warm and dry.  Neurological:     Mental Status: She is alert.    ED Results / Procedures / Treatments   Labs (all  labs ordered are listed, but only abnormal results are displayed) Labs Reviewed  CBC WITH DIFFERENTIAL/PLATELET - Abnormal; Notable for the following components:      Result Value   WBC 27.8 (*)    MCV 100.8 (*)    Platelets 509 (*)    Neutro Abs 23.9 (*)    Monocytes Absolute 2.7 (*)    Abs Immature Granulocytes 0.40 (*)    All other components within normal limits  COMPREHENSIVE METABOLIC PANEL - Abnormal; Notable for the following components:   Sodium 130 (*)    Chloride 96 (*)    Glucose, Bld 205 (*)    Calcium 7.8 (*)    Albumin 3.0 (*)    All other components within normal limits  LACTIC ACID, PLASMA - Abnormal; Notable for the following components:   Lactic Acid, Venous 2.1 (*)    All other components within normal limits  LACTIC ACID, PLASMA - Abnormal; Notable for the following components:   Lactic Acid, Venous 2.1 (*)    All other components within normal limits  CULTURE, BLOOD (ROUTINE X 2)  CULTURE, BLOOD (ROUTINE X 2)  LIPASE, BLOOD  URINALYSIS, ROUTINE W REFLEX MICROSCOPIC  TROPONIN I (HIGH SENSITIVITY)  TROPONIN I (HIGH SENSITIVITY)    EKG EKG Interpretation  Date/Time:  Friday September 24 2020 20:21:17 EDT Ventricular Rate:  122 PR Interval:  93 QRS Duration: 96 QT Interval:  348 QTC Calculation: 496 R Axis:   -45 Text Interpretation: Sinus tachycardia LAD, consider left anterior fascicular block Borderline low voltage, extremity leads RSR' in V1 or V2, right VCD or RVH Borderline prolonged QT interval Since last tracing Rate faster Otherwise no significant change Confirmed by Calvert Cantor [phone]) on 09/24/2020 9:11:35 PM  Radiology DG Chest 2 View  Result Date: 09/24/2020 CLINICAL DATA:  Cough EXAM: CHEST - 2 VIEW COMPARISON:  02/09/2012 FINDINGS: Small moderate left pleural effusion. Airspace disease at left lung base. Normal cardiac size. Aortic atherosclerosis. No pneumothorax. IMPRESSION: Small moderate left pleural effusion with left basilar  atelectasis or pneumonia. Electronically Signed   By: Donavan Foil M.D.   On: 09/24/2020 21:37   CT Angio Chest PE W and/or Wo Contrast  Result Date: 09/24/2020 CLINICAL DATA:  Abdominal pain and fever. Pancreatectomy on 08/30/2020 as well as laparoscopic splenectomy and liver ultrasound. Normal lipase levels. EXAM: CT ANGIOGRAPHY CHEST CT ABDOMEN AND PELVIS WITH CONTRAST TECHNIQUE: Multidetector CT imaging of the chest was performed using the standard protocol during bolus administration of intravenous contrast. Multiplanar CT image reconstructions and MIPs were obtained to evaluate the vascular anatomy. Multidetector CT imaging of the abdomen and pelvis was performed using the standard protocol during bolus administration of intravenous contrast. CONTRAST:  70mL OMNIPAQUE IOHEXOL 350 MG/ML SOLN COMPARISON:  MR abdomen 08/04/2020, CT abdomen pelvis 10/20/2019 FINDINGS: CTA CHEST FINDINGS Cardiovascular: Satisfactory opacification of the pulmonary arteries to the segmental level. No evidence of pulmonary embolism. The main pulmonary artery is normal in caliber. Normal heart size. No significant pericardial effusion. The thoracic aorta is  normal in caliber. Moderate to severe calcified and noncalcified atherosclerotic plaque of the thoracic aorta. Coronary artery calcifications. Mediastinum/Nodes: No enlarged mediastinal, hilar, or axillary lymph nodes. Thyroid gland, trachea, and esophagus demonstrate no significant findings. Lungs/Pleura: Passive atelectasis of the left lower lobe. No focal consolidation. No pulmonary nodule or mass. No pneumothorax. At least small volume left pleural effusion. No associated pleural thickening or enhancement. No right pleural effusion. Musculoskeletal: No chest wall abnormality. No suspicious lytic or blastic osseous lesions. No acute displaced fracture. Multilevel degenerative changes of the spine. Review of the MIP images confirms the above findings. CT ABDOMEN and PELVIS  FINDINGS Hepatobiliary: No focal liver abnormality. No gallstones, gallbladder wall thickening, or pericholecystic fluid. No biliary dilatation. Pancreas: Status post distal pancreatectomy. Associated fat stranding and free fluid within the left upper abdomen. No main pancreatic duct dilatation. Loss of fat plane between pancreatic anastomosis and gastric wall with associated gastric wall thickening (3:25.) Spleen: Status post splenectomy. Adrenals/Urinary Tract: No adrenal nodule bilaterally. Asymmetric left perinephric fat stranding. Bilateral kidneys enhance symmetrically. There is urothelial thickening of the left renal pelvis and ureter. Redemonstration of a 1 cm exophytic left renal lesion with a density of 73 Hounsfield units (6:65, 3:22). No hydronephrosis. No hydroureter. The urinary bladder is unremarkable. Stomach/Bowel: Posterior gastric wall thickening and edema (3: 23-26). No stomach dilatation. Otherwise no evidence of bowel wall thickening or dilatation. Scattered colonic diverticulosis. The appendix not definitely identified. Vascular/Lymphatic: No abdominal aorta or iliac aneurysm. At least moderate atherosclerotic plaque of the aorta and its branches. No abdominal, pelvic, or inguinal lymphadenopathy. Reproductive: Status post hysterectomy. No adnexal masses. Other: Left upper abdomen surgical drain with tip terminating inferior to the expected pancreatic tail region. Trace left upper abdominal and pelvic free fluid. No intraperitoneal free gas. No organized fluid collection. Musculoskeletal: No abdominal wall hernia or abnormality. Healing anterior abdominal incision. No suspicious lytic or blastic osseous lesions. No acute displaced fracture. Multilevel degenerative changes of the spine. Review of the MIP images confirms the above findings. IMPRESSION: 1. No central or segmental pulmonary embolus. 2. At least small volume left pleural effusion. 3. Status post distal pancreatectomy with  associated fat stranding and trace free fluid within the surgical bed. Adhesion of the pancreatic anastomosis and gastric wall i is identified with associated gastric wall thickening/edema. Underlying fistula is not excluded. No organized fluid collection. 4. Asymmetric left perinephric fat stranding with associated urothelial thickening. Correlate with urinalysis for infection. 5. Indeterminate 1 cm left exophytic renal lesion. Recommend nonemergent MRI renal protocol for further evaluation. 6. Scattered colonic diverticulosis with no acute diverticulitis. 7.  Aortic Atherosclerosis (ICD10-I70.0). Electronically Signed   By: Iven Finn M.D.   On: 09/24/2020 23:26   CT ABDOMEN PELVIS W CONTRAST  Result Date: 09/24/2020 CLINICAL DATA:  Abdominal pain and fever. Pancreatectomy on 08/30/2020 as well as laparoscopic splenectomy and liver ultrasound. Normal lipase levels. EXAM: CT ANGIOGRAPHY CHEST CT ABDOMEN AND PELVIS WITH CONTRAST TECHNIQUE: Multidetector CT imaging of the chest was performed using the standard protocol during bolus administration of intravenous contrast. Multiplanar CT image reconstructions and MIPs were obtained to evaluate the vascular anatomy. Multidetector CT imaging of the abdomen and pelvis was performed using the standard protocol during bolus administration of intravenous contrast. CONTRAST:  47mL OMNIPAQUE IOHEXOL 350 MG/ML SOLN COMPARISON:  MR abdomen 08/04/2020, CT abdomen pelvis 10/20/2019 FINDINGS: CTA CHEST FINDINGS Cardiovascular: Satisfactory opacification of the pulmonary arteries to the segmental level. No evidence of pulmonary embolism. The main pulmonary artery is  normal in caliber. Normal heart size. No significant pericardial effusion. The thoracic aorta is normal in caliber. Moderate to severe calcified and noncalcified atherosclerotic plaque of the thoracic aorta. Coronary artery calcifications. Mediastinum/Nodes: No enlarged mediastinal, hilar, or axillary lymph  nodes. Thyroid gland, trachea, and esophagus demonstrate no significant findings. Lungs/Pleura: Passive atelectasis of the left lower lobe. No focal consolidation. No pulmonary nodule or mass. No pneumothorax. At least small volume left pleural effusion. No associated pleural thickening or enhancement. No right pleural effusion. Musculoskeletal: No chest wall abnormality. No suspicious lytic or blastic osseous lesions. No acute displaced fracture. Multilevel degenerative changes of the spine. Review of the MIP images confirms the above findings. CT ABDOMEN and PELVIS FINDINGS Hepatobiliary: No focal liver abnormality. No gallstones, gallbladder wall thickening, or pericholecystic fluid. No biliary dilatation. Pancreas: Status post distal pancreatectomy. Associated fat stranding and free fluid within the left upper abdomen. No main pancreatic duct dilatation. Loss of fat plane between pancreatic anastomosis and gastric wall with associated gastric wall thickening (3:25.) Spleen: Status post splenectomy. Adrenals/Urinary Tract: No adrenal nodule bilaterally. Asymmetric left perinephric fat stranding. Bilateral kidneys enhance symmetrically. There is urothelial thickening of the left renal pelvis and ureter. Redemonstration of a 1 cm exophytic left renal lesion with a density of 73 Hounsfield units (6:65, 3:22). No hydronephrosis. No hydroureter. The urinary bladder is unremarkable. Stomach/Bowel: Posterior gastric wall thickening and edema (3: 23-26). No stomach dilatation. Otherwise no evidence of bowel wall thickening or dilatation. Scattered colonic diverticulosis. The appendix not definitely identified. Vascular/Lymphatic: No abdominal aorta or iliac aneurysm. At least moderate atherosclerotic plaque of the aorta and its branches. No abdominal, pelvic, or inguinal lymphadenopathy. Reproductive: Status post hysterectomy. No adnexal masses. Other: Left upper abdomen surgical drain with tip terminating inferior to  the expected pancreatic tail region. Trace left upper abdominal and pelvic free fluid. No intraperitoneal free gas. No organized fluid collection. Musculoskeletal: No abdominal wall hernia or abnormality. Healing anterior abdominal incision. No suspicious lytic or blastic osseous lesions. No acute displaced fracture. Multilevel degenerative changes of the spine. Review of the MIP images confirms the above findings. IMPRESSION: 1. No central or segmental pulmonary embolus. 2. At least small volume left pleural effusion. 3. Status post distal pancreatectomy with associated fat stranding and trace free fluid within the surgical bed. Adhesion of the pancreatic anastomosis and gastric wall i is identified with associated gastric wall thickening/edema. Underlying fistula is not excluded. No organized fluid collection. 4. Asymmetric left perinephric fat stranding with associated urothelial thickening. Correlate with urinalysis for infection. 5. Indeterminate 1 cm left exophytic renal lesion. Recommend nonemergent MRI renal protocol for further evaluation. 6. Scattered colonic diverticulosis with no acute diverticulitis. 7.  Aortic Atherosclerosis (ICD10-I70.0). Electronically Signed   By: Iven Finn M.D.   On: 09/24/2020 23:26    Procedures Procedures   CRITICAL CARE Performed by: Rodney Booze   Total critical care time: 32 minutes  Critical care time was exclusive of separately billable procedures and treating other patients.  Critical care was necessary to treat or prevent imminent or life-threatening deterioration.  Critical care was time spent personally by me on the following activities: development of treatment plan with patient and/or surrogate as well as nursing, discussions with consultants, evaluation of patient's response to treatment, examination of patient, obtaining history from patient or surrogate, ordering and performing treatments and interventions, ordering and review of  laboratory studies, ordering and review of radiographic studies, pulse oximetry and re-evaluation of patient's condition.   Medications Ordered  in ED Medications  lactated ringers infusion ( Intravenous New Bag/Given 09/24/20 2207)  vancomycin (VANCOREADY) IVPB 1500 mg/300 mL (1,500 mg Intravenous New Bag/Given 09/24/20 2210)  ceFEPIme (MAXIPIME) 2 g in sodium chloride 0.9 % 100 mL IVPB (0 g Intravenous Stopped 09/24/20 2236)  vancomycin (VANCOCIN) IVPB 1000 mg/200 mL premix (has no administration in time range)  ondansetron (ZOFRAN) injection 4 mg (4 mg Intravenous Given 09/24/20 2114)  morphine 2 MG/ML injection 2 mg (2 mg Intravenous Given 09/24/20 2114)  acetaminophen (TYLENOL) tablet 650 mg (650 mg Oral Given 09/24/20 2213)  metroNIDAZOLE (FLAGYL) IVPB 500 mg (500 mg Intravenous New Bag/Given 09/24/20 2208)  morphine 4 MG/ML injection 4 mg (4 mg Intravenous Given 09/24/20 2307)  ondansetron (ZOFRAN) injection 4 mg (4 mg Intravenous Given 09/24/20 2307)  iohexol (OMNIPAQUE) 350 MG/ML injection 75 mL (75 mLs Intravenous Contrast Given 09/24/20 2234)    ED Course  I have reviewed the triage vital signs and the nursing notes.  Pertinent labs & imaging results that were available during my care of the patient were reviewed by me and considered in my medical decision making (see chart for details).    MDM Rules/Calculators/A&P                          80 year old female presents the emergency department today for evaluation of abdominal pain, nausea vomiting as well as pleuritic back pain and shortness of breath.  She was noted to be febrile on arrival with associated tachycardia and tachypnea.  Code sepsis was called and patient was started on IV fluids, IV antibiotics and was given antipyretics.  She is also given antiemetics and pain medication.  Reviewed/interpreted labs CBC with significant leukocytosis, no anemia CMP with mild hyponatremia, otherwise reassuring Lipase negative Trop  negative UA pending Lactic acid elevated Blood cultures obtained  EKG - with faster rate but otherwise is unchanged   Reviewed/interpreted imaging CTA chest/abd/pelvis - 1. No central or segmental pulmonary embolus. 2. At least small volume left pleural effusion. 3. Status post distal pancreatectomy with associated fat stranding and trace free fluid within the surgical bed. Adhesion of the pancreatic anastomosis and gastric wall i is identified with associated gastric wall thickening/edema. Underlying fistula is not excluded. No organized fluid collection. 4. Asymmetric left perinephric fat stranding with associated urothelial thickening. Correlate with urinalysis for infection. 5. Indeterminate 1 cm left exophytic renal lesion. Recommend nonemergent MRI renal protocol for further evaluation. 6. Scattered colonic diverticulosis with no acute diverticulitis. 7.  Aortic Atherosclerosis  At shift change, pt pending imaging of the chest/abd. Care transitioned to Dr. Sedonia Small who will f/u on results. Anticipate admission.   Final Clinical Impression(s) / ED Diagnoses Final diagnoses:  Sepsis, due to unspecified organism, unspecified whether acute organ dysfunction present [facility])    Rx / DC Orders ED Discharge Orders     None        Bishop Dublin 09/24/20 2333    Maudie Flakes, MD 09/25/20 [phone]

## 2020-09-24 NOTE — Sepsis Progress Note (Signed)
Code Sepsis initiated @ 2131 PM, [facility] following.

## 2020-09-24 NOTE — ED Notes (Addendum)
Pt o2 dropped to 90% on RA.  Placed on 2lpm [place].  o2 sats increased to 98

## 2020-09-24 NOTE — ED Triage Notes (Signed)
Pt presents to Ed from home with c/o abd pain- pt had abd surgery at [facility] the first of August, pt has had abd pain since then, however nausea and vomiting started Wednesday night and still present. Pt saw surgeon for 2nd f/u appt on Wednesday and was sent home with instructions to f/u in a week.

## 2020-09-24 NOTE — ED Notes (Signed)
EKG done at time of arrival

## 2020-09-24 NOTE — ED Notes (Addendum)
Date and time results received: 09/24/20 11:30 PM  Test: Lactic Acid Critical Value: 2.1  Name of Provider Notified: Dr.Bero

## 2020-09-24 NOTE — ED Provider Notes (Signed)
  Provider Note MRN:  [id number]  Arrival date & time: 09/24/20    ED Course and Medical Decision Making  Assumed care from Dr. Karle Starch at shift change.  Fever, tachycardia, concern for sepsis, recent surgery, pancreatectomy, splenectomy.  CT scan showing possible cystitis, awaiting urinalysis.  Also some postsurgical changes that we will discuss with Newborn surgery.  Will need admission.  Discussed case with Dr. Bobbye [place] of [facility] surgery, who also had discussions with patient's surgeon Dr. Zenia Resides.  The CT findings at or near the surgical bed are largely to be expected, and given the patient's findings of likely urinary tract infection we have an alternate a more likely source.  Patient is appropriate for admission here at [facility], Dr. Michaelle Birks will be happy to assist in consultation if further questions arise.  .Critical Care  Date/Time: 09/25/2020 12:45 AM Performed by: Maudie Flakes, MD Authorized by: Maudie Flakes, MD   Critical care provider statement:    Critical care time (minutes):  35   Critical care was necessary to treat or prevent imminent or life-threatening deterioration of the following conditions:  Sepsis   Critical care was time spent personally by me on the following activities:  Discussions with consultants, evaluation of patient's response to treatment, examination of patient, ordering and performing treatments and interventions, ordering and review of laboratory studies, ordering and review of radiographic studies, pulse oximetry, re-evaluation of patient's condition, obtaining history from patient or surrogate and review of old charts   I assumed direction of critical care for this patient from another provider in my specialty: yes     Care discussed with: admitting provider    Final Clinical Impressions(s) / ED Diagnoses     ICD-10-CM   1. Sepsis, due to unspecified organism, unspecified whether acute organ dysfunction present [facility])  A41.9        ED Discharge Orders     None       Discharge Instructions   None     Barth Kirks. [place] Small, [place] [email or web address]    Maudie Flakes, MD 09/25/20 [phone]

## 2020-09-25 DIAGNOSIS — Z8249 Family history of ischemic heart disease and other diseases of the circulatory system: Secondary | ICD-10-CM | POA: Diagnosis not present

## 2020-09-25 DIAGNOSIS — D72829 Elevated white blood cell count, unspecified: Secondary | ICD-10-CM

## 2020-09-25 DIAGNOSIS — E440 Moderate protein-calorie malnutrition: Secondary | ICD-10-CM | POA: Diagnosis present

## 2020-09-25 DIAGNOSIS — R739 Hyperglycemia, unspecified: Secondary | ICD-10-CM

## 2020-09-25 DIAGNOSIS — A419 Sepsis, unspecified organism: Secondary | ICD-10-CM

## 2020-09-25 DIAGNOSIS — J811 Chronic pulmonary edema: Secondary | ICD-10-CM | POA: Diagnosis not present

## 2020-09-25 DIAGNOSIS — Z9081 Acquired absence of spleen: Secondary | ICD-10-CM | POA: Diagnosis not present

## 2020-09-25 DIAGNOSIS — K219 Gastro-esophageal reflux disease without esophagitis: Secondary | ICD-10-CM | POA: Diagnosis present

## 2020-09-25 DIAGNOSIS — D75839 Thrombocytosis, unspecified: Secondary | ICD-10-CM

## 2020-09-25 DIAGNOSIS — E785 Hyperlipidemia, unspecified: Secondary | ICD-10-CM | POA: Diagnosis present

## 2020-09-25 DIAGNOSIS — M797 Fibromyalgia: Secondary | ICD-10-CM | POA: Diagnosis present

## 2020-09-25 DIAGNOSIS — A4101 Sepsis due to Methicillin susceptible Staphylococcus aureus: Secondary | ICD-10-CM | POA: Diagnosis present

## 2020-09-25 DIAGNOSIS — E872 Acidosis, unspecified: Secondary | ICD-10-CM

## 2020-09-25 DIAGNOSIS — E1165 Type 2 diabetes mellitus with hyperglycemia: Secondary | ICD-10-CM | POA: Diagnosis present

## 2020-09-25 DIAGNOSIS — Z811 Family history of alcohol abuse and dependence: Secondary | ICD-10-CM | POA: Diagnosis not present

## 2020-09-25 DIAGNOSIS — J440 Chronic obstructive pulmonary disease with acute lower respiratory infection: Secondary | ICD-10-CM | POA: Diagnosis present

## 2020-09-25 DIAGNOSIS — R0602 Shortness of breath: Secondary | ICD-10-CM | POA: Diagnosis not present

## 2020-09-25 DIAGNOSIS — Z820 Family history of epilepsy and other diseases of the nervous system: Secondary | ICD-10-CM | POA: Diagnosis not present

## 2020-09-25 DIAGNOSIS — R531 Weakness: Secondary | ICD-10-CM | POA: Diagnosis not present

## 2020-09-25 DIAGNOSIS — E876 Hypokalemia: Secondary | ICD-10-CM | POA: Diagnosis present

## 2020-09-25 DIAGNOSIS — N390 Urinary tract infection, site not specified: Secondary | ICD-10-CM | POA: Diagnosis not present

## 2020-09-25 DIAGNOSIS — T8149XA Infection following a procedure, other surgical site, initial encounter: Secondary | ICD-10-CM | POA: Diagnosis present

## 2020-09-25 DIAGNOSIS — E871 Hypo-osmolality and hyponatremia: Secondary | ICD-10-CM

## 2020-09-25 DIAGNOSIS — D539 Nutritional anemia, unspecified: Secondary | ICD-10-CM | POA: Diagnosis present

## 2020-09-25 DIAGNOSIS — F411 Generalized anxiety disorder: Secondary | ICD-10-CM | POA: Diagnosis present

## 2020-09-25 DIAGNOSIS — Z20822 Contact with and (suspected) exposure to covid-19: Secondary | ICD-10-CM | POA: Diagnosis present

## 2020-09-25 DIAGNOSIS — J189 Pneumonia, unspecified organism: Secondary | ICD-10-CM | POA: Diagnosis present

## 2020-09-25 DIAGNOSIS — M810 Age-related osteoporosis without current pathological fracture: Secondary | ICD-10-CM | POA: Diagnosis present

## 2020-09-25 DIAGNOSIS — M160 Bilateral primary osteoarthritis of hip: Secondary | ICD-10-CM | POA: Diagnosis not present

## 2020-09-25 DIAGNOSIS — R0600 Dyspnea, unspecified: Secondary | ICD-10-CM | POA: Diagnosis not present

## 2020-09-25 DIAGNOSIS — R9431 Abnormal electrocardiogram [ECG] [EKG]: Secondary | ICD-10-CM

## 2020-09-25 DIAGNOSIS — I10 Essential (primary) hypertension: Secondary | ICD-10-CM | POA: Diagnosis present

## 2020-09-25 DIAGNOSIS — J90 Pleural effusion, not elsewhere classified: Secondary | ICD-10-CM | POA: Diagnosis not present

## 2020-09-25 DIAGNOSIS — E8809 Other disorders of plasma-protein metabolism, not elsewhere classified: Secondary | ICD-10-CM | POA: Diagnosis present

## 2020-09-25 DIAGNOSIS — R1115 Cyclical vomiting syndrome unrelated to migraine: Secondary | ICD-10-CM | POA: Diagnosis not present

## 2020-09-25 LAB — COMPREHENSIVE METABOLIC PANEL
ALT: 12 U/L (ref 0–44)
AST: 16 U/L (ref 15–41)
Albumin: 2.7 g/dL — ABNORMAL LOW (ref 3.5–5.0)
Alkaline Phosphatase: 81 U/L (ref 38–126)
Anion gap: 8 (ref 5–15)
BUN: 10 mg/dL (ref 8–23)
CO2: 27 mmol/L (ref 22–32)
Calcium: 7.9 mg/dL — ABNORMAL LOW (ref 8.9–10.3)
Chloride: 98 mmol/L (ref 98–111)
Creatinine, Ser: 0.54 mg/dL (ref 0.44–1.00)
GFR, Estimated: >60 mL/min (ref >60)
Glucose, Bld: 164 mg/dL — ABNORMAL HIGH (ref 70–99)
Potassium: 3.5 mmol/L (ref 3.5–5.1)
Sodium: 133 mmol/L — ABNORMAL LOW (ref 135–145)
Total Bilirubin: 0.5 mg/dL (ref 0.3–1.2)
Total Protein: 6.1 g/dL — ABNORMAL LOW (ref 6.5–8.1)

## 2020-09-25 LAB — CBC
HCT: 38.2 % (ref 36.0–46.0)
Hemoglobin: 12.1 g/dL (ref 12.0–15.0)
MCH: 32.4 pg (ref 26.0–34.0)
MCHC: 31.7 g/dL (ref 30.0–36.0)
MCV: 102.4 fL — ABNORMAL HIGH (ref 80.0–100.0)
Platelets: 432 10*3/uL — ABNORMAL HIGH (ref 150–400)
RBC: 3.73 MIL/uL — ABNORMAL LOW (ref 3.87–5.11)
RDW: 11.8 % (ref 11.5–15.5)
WBC: 27.3 10*3/uL — ABNORMAL HIGH (ref 4.0–10.5)
nRBC: 0 % (ref 0.0–0.2)

## 2020-09-25 LAB — RESP PANEL BY RT-PCR (FLU A&B, COVID) ARPGX2
Influenza A by PCR: NEGATIVE
Influenza B by PCR: NEGATIVE
SARS Coronavirus 2 by RT PCR: NEGATIVE

## 2020-09-25 LAB — APTT: aPTT: 33 seconds (ref 24–36)

## 2020-09-25 LAB — URINALYSIS, ROUTINE W REFLEX MICROSCOPIC
Bacteria, UA: NONE SEEN
Bilirubin Urine: NEGATIVE
Glucose, UA: NEGATIVE mg/dL
Hgb urine dipstick: NEGATIVE
Ketones, ur: 5 mg/dL — AB
Nitrite: NEGATIVE
Protein, ur: NEGATIVE mg/dL
Specific Gravity, Urine: 1.025 (ref 1.005–1.030)
WBC, UA: >50 WBC/hpf — ABNORMAL HIGH (ref 0–5)
pH: 6 (ref 5.0–8.0)

## 2020-09-25 LAB — PROTIME-INR
INR: 1.2 (ref 0.8–1.2)
Prothrombin Time: 15.6 seconds — ABNORMAL HIGH (ref 11.4–15.2)

## 2020-09-25 LAB — MRSA NEXT GEN BY PCR, NASAL: MRSA by PCR Next Gen: NOT DETECTED

## 2020-09-25 LAB — LACTIC ACID, PLASMA
Lactic Acid, Venous: 1.4 mmol/L (ref 0.5–1.9)
Lactic Acid, Venous: 1.8 mmol/L (ref 0.5–1.9)

## 2020-09-25 LAB — MAGNESIUM: Magnesium: 1.8 mg/dL (ref 1.7–2.4)

## 2020-09-25 LAB — PHOSPHORUS: Phosphorus: 3.6 mg/dL (ref 2.5–4.6)

## 2020-09-25 MED ORDER — ENSURE ENLIVE PO LIQD
237.0000 mL | Freq: Two times a day (BID) | ORAL | Status: DC
  Administered 2020-09-26 – 2020-09-27 (×4): 237 mL via ORAL

## 2020-09-25 MED ORDER — PANTOPRAZOLE SODIUM 40 MG PO TBEC
40.0000 mg | DELAYED_RELEASE_TABLET | Freq: Every day | ORAL | Status: DC
  Administered 2020-09-25 – 2020-09-29 (×5): 40 mg via ORAL
  Filled 2020-09-25 (×5): qty 1

## 2020-09-25 MED ORDER — ENOXAPARIN SODIUM 40 MG/0.4ML IJ SOSY
40.0000 mg | PREFILLED_SYRINGE | INTRAMUSCULAR | Status: DC
  Administered 2020-09-25 – 2020-09-29 (×5): 40 mg via SUBCUTANEOUS
  Filled 2020-09-25 (×5): qty 0.4

## 2020-09-25 MED ORDER — HYDROMORPHONE HCL 1 MG/ML IJ SOLN: 0.5000 mg | INTRAMUSCULAR | Status: DC | PRN

## 2020-09-25 MED ORDER — SODIUM CHLORIDE 0.9 % IV SOLN: INTRAVENOUS | Status: DC

## 2020-09-25 MED ORDER — ACETAMINOPHEN 325 MG PO TABS
650.0000 mg | ORAL_TABLET | Freq: Four times a day (QID) | ORAL | Status: DC | PRN
  Administered 2020-09-25 – 2020-09-28 (×3): 650 mg via ORAL
  Filled 2020-09-25 (×3): qty 2

## 2020-09-25 MED ORDER — ONDANSETRON HCL 4 MG/2ML IJ SOLN
4.0000 mg | Freq: Once | INTRAMUSCULAR | Status: AC
  Administered 2020-09-25: 4 mg via INTRAVENOUS
  Filled 2020-09-25: qty 2

## 2020-09-25 MED ORDER — CHLORHEXIDINE GLUCONATE CLOTH 2 % EX PADS
6.0000 | MEDICATED_PAD | Freq: Every day | CUTANEOUS | Status: DC
  Administered 2020-09-25 – 2020-09-28 (×3): 6 via TOPICAL

## 2020-09-25 MED ORDER — PROCHLORPERAZINE 25 MG RE SUPP
25.0000 mg | Freq: Two times a day (BID) | RECTAL | Status: DC | PRN
  Administered 2020-09-27: 25 mg via RECTAL
  Filled 2020-09-25 (×3): qty 1

## 2020-09-25 MED ORDER — ONDANSETRON HCL 4 MG/2ML IJ SOLN: 4.0000 mg | Freq: Once | INTRAMUSCULAR | Status: DC

## 2020-09-25 MED ORDER — GLUCERNA SHAKE PO LIQD
237.0000 mL | Freq: Three times a day (TID) | ORAL | Status: DC
  Filled 2020-09-25 (×8): qty 237

## 2020-09-25 NOTE — Sepsis Progress Note (Signed)
Sent secure chat message to ED RN to inquire about LA results. RN reported that the results came back as 2.1 twice. Was not recorded in chart at the time. Will continue to monitor.   Debralee Braaksma DNP Warren Lacy RN 0005 AM

## 2020-09-25 NOTE — Progress Notes (Signed)
  Patient seen and evaluated, chart reviewed, please see EMR for updated orders. Please see full H&P dictated by admitting physician Dr Josephine Cables for same date of service.    Brief Summary:- 80 y.o. female with medical history significant for GERD, COPD, type II DM-admitted on 09/25/2020 with sepsis secondary to urinary source and possible pneumonia  A/p  1)Sepsis possible secondary to UTI/possible pneumonia POA Patient met sepsis criteria due to leukocytosis, fever, tachycardia, tachypnea.  UTI (?  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) CT abdomen and pelvis was done and was reassuring for postop findings after being reviewed by patient's surgeon per ED physician Abdominal drain with serous fluid--fluid culture sent on 09/25/2020 -Antibiotics as above #1  3)DM2-A1c 5.7 reflecting excellent diabetic control -No diabetic meds just diet control  4)HypoNatremia--- Na 130 >> 133, monitor sodium closely -Continue IV fluid  5)  GERD Continue Protonix  6) Hypoalbuminemia secondary to moderate protein calorie malnutrition Albumin 3.0; protein supplement encourage  -Total care time is over 46 minutes  -Patient seen and evaluated, chart reviewed, please see EMR for updated orders. Please see full H&P dictated by admitting physician Dr Josephine Cables for same date of service.   Roxan Hockey, MD

## 2020-09-25 NOTE — Sepsis Progress Note (Signed)
Code Sepsis Complete:  BC drawn, ABX administered. LA 2.1 x 2. IVF gentle hydration approx 750 ml of the 2238 ml per kgs. Pt has abd distention, cough with SOB and small volume pl effusion.   Being admitted to Med Surg unit for further monitoring.   Kelso Bibby DNP Warren Lacy RN [phone] AM

## 2020-09-25 NOTE — H&P (Signed)
History and Physical  Renee [name]:[id number] DOB: 1940-05-29 DOA: 09/24/2020  Referring physician: Maudie Flakes, MD PCP: Deland Pretty, MD  Patient coming from: Home  Chief Complaint: Abdominal pain  HPI: Renee [name] is a 80 y.o. female with medical history significant for GERD, COPD, type II DM who presents to the emergency department due to about 1 week onset of abdominal pain and left-sided back pain.  Patient states that she had a spleen and partial pancreatic removal on 09/10/2020, she recently had a postop follow-up with her surgeon and she has a forthcoming follow-up with the surgeon's office, she complained of abdominal pain which was sharp in nature and left flank pain which was of moderate intensity.  This was associated with some shortness of breath especially when she takes deep breath.  She endorsed subjective fever at home, but denies chest pain, constipation, diarrhea, painful urination or urinary frequency.   ED Course:  In the emergency department, she was febrile, tachypneic, tachycardic, BP was 142/60.  In the ED showed leukocytosis, thrombocytosis, lactic acid x2-2.1.  BMP shows hyponatremia and hyperglycemia.  Albumin 3.0.  Troponin x2 was flat at 8, lipase 41.  Urinalysis was positive for > 50 WBC and small leukocytes  CT angiography of chest showed no central or segmental pulmonary embolus CT abdomen and pelvis with contrast showed status post distal pancreatectomy with associated fat stranding and trace free fluid within the surgical bed. Chest x-ray showed Small moderate left pleural effusion with left basilar atelectasis or pneumonia. Patient was treated with Tylenol, morphine, Zofran, empiric IV antibiotics (cefepime and vancomycin) was started. General surgeon (Dr. Bobbye Morton) of [facility] surgery was consulted and they spoke with patient's surgeon and it was repeated that findings of CT was expected.  It was recommended for patient to be admitted  here at any pain and treated for UTI and that Dr. Michaelle Birks will be consulted if needed.  Hospitalist was asked to admit patient for further evaluation and management.  Review of Systems: Constitutional: Positive for fever.  HENT: Negative for ear pain and sore throat.   Eyes: Negative for pain and visual disturbance.  Respiratory: Positive for shortness of breath.  Negative for chest tightness   Cardiovascular: Negative for chest pain and palpitations.  Gastrointestinal: Positive for abdominal pain, nausea and vomiting.  Endocrine: Negative for polyphagia and polyuria.  Genitourinary: Negative for decreased urine volume, dysuria, enuresis Musculoskeletal: Negative for arthralgias and back pain.  Skin: Negative for color change and rash.  Allergic/Immunologic: Negative for immunocompromised state.  Neurological: Negative for tremors, syncope, speech difficulty Hematological: Does not bruise/bleed easily.  All other systems reviewed and are negative  Past Medical History:  Diagnosis Date   Anemia    history only at age 65 yrs olds, no current problems   Aortic atherosclerosis ([place])    Arthritis    oa   Blood transfusion 1948   Blood transfusion without reported diagnosis    Breast cancer ([place]) 1994   bilateral / HX skin cancer   Carotid atherosclerosis    Cataract    both eyes   Constipation    COPD (chronic obstructive pulmonary disease) ([facility])    no meds/no inaler   Depression    Diabetes ([place])    type 2 - diet controlled, no meds   Disc disease, degenerative, cervical    trouble turing head and neck at times   Diverticulosis    DVT (deep venous thrombosis) ([place]) 2002   left leg  Dyspnea    occasional with exertion   Dysrhythmia    Family history of adverse reaction to anesthesia    Fibromyalgia    GAD (generalized anxiety disorder)    GERD (gastroesophageal reflux disease)    History of vertigo    Hx of skin cancer, basal cell    Hyperlipidemia    Hypertension     Measles as child   Mumps age 73   Osteoarthritis    Osteoporosis    Pancreatic cyst    Pancreatitis    PONV (postoperative nausea and vomiting)    PONV   Pulmonary nodule    Right renal mass    Seborrheic keratosis    Sinus tachycardia    Sinus tachycardia    Tubular adenoma of colon    Past Surgical History:  Procedure Laterality Date   ABDOMINAL HYSTERECTOMY     achilles tendon adn 2 bone spurs removed Left 05-2012   APPENDECTOMY     BACK SURGERY  1976   lower   BASAL CELL CARCINOMA EXCISION     bilateral mastectomy with tramflap reconstruction  1994   BIOPSY  05/13/2020   Procedure: BIOPSY;  Surgeon: Irving Copas., MD;  Location: [place];  Service: Gastroenterology;;   BREAST REDUCTION SURGERY     CARPAL TUNNEL RELEASE Right 03/2017   COLONOSCOPY     ESOPHAGOGASTRODUODENOSCOPY (EGD) WITH PROPOFOL N/A 05/13/2020   Procedure: ESOPHAGOGASTRODUODENOSCOPY (EGD) WITH PROPOFOL;  Surgeon: Irving Copas., MD;  Location: [facility];  Service: Gastroenterology;  Laterality: N/A;   EUS N/A 05/13/2020   Procedure: UPPER ENDOSCOPIC ULTRASOUND (EUS) RADIAL;  Surgeon: Irving Copas., MD;  Location: [place];  Service: Gastroenterology;  Laterality: N/A;   FOOT SURGERY     KNEE ARTHROSCOPY Right 2008   KNEE SURGERY  2002   arthroscopy-bilateral   LAPAROSCOPIC LIVER ULTRASOUND N/A 08/30/2020   Procedure: LAPAROSCOPIC INTRAOPERATIVE ULTRASOUND;  Surgeon: Dwan Bolt, MD;  Location: [place];  Service: General;  Laterality: N/A;   LAPAROSCOPIC SPLENECTOMY N/A 08/30/2020   Procedure: LAPAROSCOPIC SPLENECTOMY;  Surgeon: Dwan Bolt, MD;  Location: [place];  Service: General;  Laterality: N/A;   MASTECTOMY     bilateral   PANCREATECTOMY N/A 08/30/2020   Procedure: LAPAROSCOPIC DISTAL PANCREATECTOMY;  Surgeon: Dwan Bolt, MD;  Location: [place];  Service: General;  Laterality: N/A;   PARTIAL HYSTERECTOMY     POLYPECTOMY     SHOULDER SURGERY Left     TONSILLECTOMY     TOTAL KNEE ARTHROPLASTY Left 10/14/2012   Procedure: LEFT TOTAL KNEE ARTHROPLASTY;  Surgeon: Gearlean Alf, MD;  Location: [facility];  Service: Orthopedics;  Laterality: Left;   TOTAL KNEE ARTHROPLASTY Right 10/20/2013   Procedure: RIGHT TOTAL KNEE ARTHROPLASTY;  Surgeon: Gearlean Alf, MD;  Location: [facility];  Service: Orthopedics;  Laterality: Right;    Social History:  reports that she quit smoking about 28 years ago. Her smoking use included cigarettes. She has a 20.00 pack-year smoking history. She has never used smokeless tobacco. She reports that she does not currently use alcohol. She reports that she does not use drugs.   Allergies  Allergen Reactions   Aspirin Nausea Only    Patient able to take lower dose, Aspirin $RemoveBefor'81mg'[id number]$     Atorvastatin Other (See Comments)    Pt reports causes "muscle aches."   Codeine Itching    Mood changes, can take percocet   Ibuprofen Hives    Blisters    Lisinopril Cough  Family History  Problem Relation Age of Onset   Alzheimer's disease Mother    Alcohol abuse Father    Sudden death Father    Liver disease Father        fatty liver disease; alcohol abuse   Other Father        gastritis   Healthy Brother    Alcohol abuse Daughter    COPD Daughter    Healthy Grandchild    Heart disease Maternal Grandfather    CAD Maternal Grandfather    Sudden death Brother        at birth   Unexplained death Daughter    Alcohol abuse Daughter    Healthy Sister    Colon cancer Neg Hx    Colon polyps Neg Hx    Rectal cancer Neg Hx    Stomach cancer Neg Hx    Esophageal cancer Neg Hx      Prior to Admission medications   Medication Sig Start Date End Date Taking? Authorizing Provider  acetaminophen (TYLENOL) 325 MG tablet Take 2 tablets (650 mg total) by mouth every 6 (six) hours as needed for mild pain or headache. 09/03/20   Dwan Bolt, MD  aspirin EC 81 MG tablet Take 1 tablet (81 mg total) by mouth daily. 01/18/18   Dorothy Spark, MD  Calcium Carbonate-Vit D-Min (CALCIUM 1200 PO) Take 1,200 mg by mouth daily.    [provider]  cyclobenzaprine (FLEXERIL) 10 MG tablet Take 10 mg by mouth at bedtime as needed for muscle spasms. 06/18/20   [provider]  denosumab (PROLIA) 60 MG/ML SOSY injection Inject 60 mg into the skin every 6 (six) months.  06/10/18   [provider]  docusate sodium (COLACE) 100 MG capsule Take 1 capsule (100 mg total) by mouth 2 (two) times daily. 09/03/20   Dwan Bolt, MD  esomeprazole (NEXIUM) 40 MG capsule Take 1 capsule (40 mg total) by mouth daily. 05/13/20 05/13/21  Mansouraty, Telford Nab., MD  Evolocumab (REPATHA) 140 MG/ML SOSY Inject 140 mg into the skin every 14 (fourteen) days.    [provider]  metoCLOPramide (REGLAN) 5 MG tablet Take 1 tablet (5 mg total) by mouth every 8 (eight) hours as needed for nausea or vomiting. 09/07/20   Dwan Bolt, MD  metoprolol tartrate (LOPRESSOR) 50 MG tablet TAKE 1 TABLET(50 MG) BY MOUTH TWICE DAILY 08/12/20   Richardson Dopp T, PA-C  Multiple Vitamin (MULTIVITAMIN) tablet Take 1 tablet by mouth daily.    [provider]  polyethylene glycol (MIRALAX / GLYCOLAX) 17 g packet Take 17 g by mouth daily. 10/17/19   [provider]  traMADol (ULTRAM) 50 MG tablet Take 1 tablet (50 mg total) by mouth every 6 (six) hours as needed for severe pain. 09/07/20   Dwan Bolt, MD  VITAMIN D PO Take 1,000 Units by mouth daily.    [provider]    Physical Exam: BP (!) 118/58   Pulse (!) 107   Temp 99.3 F (37.4 C) (Oral)   Resp 17   Ht $R'5\' 3"'pO$  (1.6 m)   Wt 74.6 kg   LMP  (LMP Unknown)   SpO2 97%   BMI 29.13 kg/m   General: 80 y.o. year-old female well developed, ill appearing but in no acute distress.  Alert and oriented x3. HEENT: NCAT, EOMI Neck: Supple, trachea medial Cardiovascular: Tachycardia.  Regular rate and rhythm with no rubs or gallops.  No thyromegaly or JVD  noted.  2/4  pulses in all 4 extremities. Respiratory: Clear to auscultation with no wheezes or rales. Good inspiratory effort. Abdomen: Soft, tender to palpation of epigastric and LUQ.  Left CVA tenderness noted.  Abdominal drain with serous fluid noted, no erythema around the drain site noted.   Muskuloskeletal: No cyanosis, clubbing or edema noted bilaterally Neuro: CN II-XII intact, strength 5/5 x 4, sensation, reflexes intact Skin: No ulcerative lesions noted or rashes Psychiatry: Mood is appropriate for condition and setting          Labs on Admission:  Basic Metabolic Panel: Recent Labs  Lab 09/24/20 2103  NA 130*  K 3.7  CL 96*  CO2 24  GLUCOSE 205*  BUN 9  CREATININE 0.62  CALCIUM 7.8*   Liver Function Tests: Recent Labs  Lab 09/24/20 2103  AST 23  ALT 12  ALKPHOS 91  BILITOT 0.6  PROT 6.6  ALBUMIN 3.0*   Recent Labs  Lab 09/24/20 2103  LIPASE 41   No results for input(s): AMMONIA in the last 168 hours. CBC: Recent Labs  Lab 09/24/20 2103  WBC 27.8*  NEUTROABS 23.9*  HGB 12.9  HCT 39.6  MCV 100.8*  PLT 509*   Cardiac Enzymes: No results for input(s): CKTOTAL, CKMB, CKMBINDEX, TROPONINI in the last 168 hours.  BNP (last 3 results) No results for input(s): BNP in the last 8760 hours.  ProBNP (last 3 results) No results for input(s): PROBNP in the last 8760 hours.  CBG: No results for input(s): GLUCAP in the last 168 hours.  Radiological Exams on Admission: DG Chest 2 View  Result Date: 09/24/2020 CLINICAL DATA:  Cough EXAM: CHEST - 2 VIEW COMPARISON:  02/09/2012 FINDINGS: Small moderate left pleural effusion. Airspace disease at left lung base. Normal cardiac size. Aortic atherosclerosis. No pneumothorax. IMPRESSION: Small moderate left pleural effusion with left basilar atelectasis or pneumonia. Electronically Signed   By: Donavan Foil M.D.   On: 09/24/2020 21:37   CT Angio Chest PE W and/or Wo Contrast  Result Date: 09/24/2020 CLINICAL DATA:   Abdominal pain and fever. Pancreatectomy on 08/30/2020 as well as laparoscopic splenectomy and liver ultrasound. Normal lipase levels. EXAM: CT ANGIOGRAPHY CHEST CT ABDOMEN AND PELVIS WITH CONTRAST TECHNIQUE: Multidetector CT imaging of the chest was performed using the standard protocol during bolus administration of intravenous contrast. Multiplanar CT image reconstructions and MIPs were obtained to evaluate the vascular anatomy. Multidetector CT imaging of the abdomen and pelvis was performed using the standard protocol during bolus administration of intravenous contrast. CONTRAST:  11mL OMNIPAQUE IOHEXOL 350 MG/ML SOLN COMPARISON:  MR abdomen 08/04/2020, CT abdomen pelvis 10/20/2019 FINDINGS: CTA CHEST FINDINGS Cardiovascular: Satisfactory opacification of the pulmonary arteries to the segmental level. No evidence of pulmonary embolism. The main pulmonary artery is normal in caliber. Normal heart size. No significant pericardial effusion. The thoracic aorta is normal in caliber. Moderate to severe calcified and noncalcified atherosclerotic plaque of the thoracic aorta. Coronary artery calcifications. Mediastinum/Nodes: No enlarged mediastinal, hilar, or axillary lymph nodes. Thyroid gland, trachea, and esophagus demonstrate no significant findings. Lungs/Pleura: Passive atelectasis of the left lower lobe. No focal consolidation. No pulmonary nodule or mass. No pneumothorax. At least small volume left pleural effusion. No associated pleural thickening or enhancement. No right pleural effusion. Musculoskeletal: No chest wall abnormality. No suspicious lytic or blastic osseous lesions. No acute displaced fracture. Multilevel degenerative changes of the spine. Review of the MIP images confirms the above findings. CT ABDOMEN and PELVIS FINDINGS Hepatobiliary: No  focal liver abnormality. No gallstones, gallbladder wall thickening, or pericholecystic fluid. No biliary dilatation. Pancreas: Status post distal  pancreatectomy. Associated fat stranding and free fluid within the left upper abdomen. No main pancreatic duct dilatation. Loss of fat plane between pancreatic anastomosis and gastric wall with associated gastric wall thickening (3:25.) Spleen: Status post splenectomy. Adrenals/Urinary Tract: No adrenal nodule bilaterally. Asymmetric left perinephric fat stranding. Bilateral kidneys enhance symmetrically. There is urothelial thickening of the left renal pelvis and ureter. Redemonstration of a 1 cm exophytic left renal lesion with a density of 73 Hounsfield units (6:65, 3:22). No hydronephrosis. No hydroureter. The urinary bladder is unremarkable. Stomach/Bowel: Posterior gastric wall thickening and edema (3: 23-26). No stomach dilatation. Otherwise no evidence of bowel wall thickening or dilatation. Scattered colonic diverticulosis. The appendix not definitely identified. Vascular/Lymphatic: No abdominal aorta or iliac aneurysm. At least moderate atherosclerotic plaque of the aorta and its branches. No abdominal, pelvic, or inguinal lymphadenopathy. Reproductive: Status post hysterectomy. No adnexal masses. Other: Left upper abdomen surgical drain with tip terminating inferior to the expected pancreatic tail region. Trace left upper abdominal and pelvic free fluid. No intraperitoneal free gas. No organized fluid collection. Musculoskeletal: No abdominal wall hernia or abnormality. Healing anterior abdominal incision. No suspicious lytic or blastic osseous lesions. No acute displaced fracture. Multilevel degenerative changes of the spine. Review of the MIP images confirms the above findings. IMPRESSION: 1. No central or segmental pulmonary embolus. 2. At least small volume left pleural effusion. 3. Status post distal pancreatectomy with associated fat stranding and trace free fluid within the surgical bed. Adhesion of the pancreatic anastomosis and gastric wall i is identified with associated gastric wall  thickening/edema. Underlying fistula is not excluded. No organized fluid collection. 4. Asymmetric left perinephric fat stranding with associated urothelial thickening. Correlate with urinalysis for infection. 5. Indeterminate 1 cm left exophytic renal lesion. Recommend nonemergent MRI renal protocol for further evaluation. 6. Scattered colonic diverticulosis with no acute diverticulitis. 7.  Aortic Atherosclerosis (ICD10-I70.0). Electronically Signed   By: Iven Finn M.D.   On: 09/24/2020 23:26   CT ABDOMEN PELVIS W CONTRAST  Result Date: 09/24/2020 CLINICAL DATA:  Abdominal pain and fever. Pancreatectomy on 08/30/2020 as well as laparoscopic splenectomy and liver ultrasound. Normal lipase levels. EXAM: CT ANGIOGRAPHY CHEST CT ABDOMEN AND PELVIS WITH CONTRAST TECHNIQUE: Multidetector CT imaging of the chest was performed using the standard protocol during bolus administration of intravenous contrast. Multiplanar CT image reconstructions and MIPs were obtained to evaluate the vascular anatomy. Multidetector CT imaging of the abdomen and pelvis was performed using the standard protocol during bolus administration of intravenous contrast. CONTRAST:  59mL OMNIPAQUE IOHEXOL 350 MG/ML SOLN COMPARISON:  MR abdomen 08/04/2020, CT abdomen pelvis 10/20/2019 FINDINGS: CTA CHEST FINDINGS Cardiovascular: Satisfactory opacification of the pulmonary arteries to the segmental level. No evidence of pulmonary embolism. The main pulmonary artery is normal in caliber. Normal heart size. No significant pericardial effusion. The thoracic aorta is normal in caliber. Moderate to severe calcified and noncalcified atherosclerotic plaque of the thoracic aorta. Coronary artery calcifications. Mediastinum/Nodes: No enlarged mediastinal, hilar, or axillary lymph nodes. Thyroid gland, trachea, and esophagus demonstrate no significant findings. Lungs/Pleura: Passive atelectasis of the left lower lobe. No focal consolidation. No pulmonary  nodule or mass. No pneumothorax. At least small volume left pleural effusion. No associated pleural thickening or enhancement. No right pleural effusion. Musculoskeletal: No chest wall abnormality. No suspicious lytic or blastic osseous lesions. No acute displaced fracture. Multilevel degenerative changes of the spine. Review  of the MIP images confirms the above findings. CT ABDOMEN and PELVIS FINDINGS Hepatobiliary: No focal liver abnormality. No gallstones, gallbladder wall thickening, or pericholecystic fluid. No biliary dilatation. Pancreas: Status post distal pancreatectomy. Associated fat stranding and free fluid within the left upper abdomen. No main pancreatic duct dilatation. Loss of fat plane between pancreatic anastomosis and gastric wall with associated gastric wall thickening (3:25.) Spleen: Status post splenectomy. Adrenals/Urinary Tract: No adrenal nodule bilaterally. Asymmetric left perinephric fat stranding. Bilateral kidneys enhance symmetrically. There is urothelial thickening of the left renal pelvis and ureter. Redemonstration of a 1 cm exophytic left renal lesion with a density of 73 Hounsfield units (6:65, 3:22). No hydronephrosis. No hydroureter. The urinary bladder is unremarkable. Stomach/Bowel: Posterior gastric wall thickening and edema (3: 23-26). No stomach dilatation. Otherwise no evidence of bowel wall thickening or dilatation. Scattered colonic diverticulosis. The appendix not definitely identified. Vascular/Lymphatic: No abdominal aorta or iliac aneurysm. At least moderate atherosclerotic plaque of the aorta and its branches. No abdominal, pelvic, or inguinal lymphadenopathy. Reproductive: Status post hysterectomy. No adnexal masses. Other: Left upper abdomen surgical drain with tip terminating inferior to the expected pancreatic tail region. Trace left upper abdominal and pelvic free fluid. No intraperitoneal free gas. No organized fluid collection. Musculoskeletal: No abdominal  wall hernia or abnormality. Healing anterior abdominal incision. No suspicious lytic or blastic osseous lesions. No acute displaced fracture. Multilevel degenerative changes of the spine. Review of the MIP images confirms the above findings. IMPRESSION: 1. No central or segmental pulmonary embolus. 2. At least small volume left pleural effusion. 3. Status post distal pancreatectomy with associated fat stranding and trace free fluid within the surgical bed. Adhesion of the pancreatic anastomosis and gastric wall i is identified with associated gastric wall thickening/edema. Underlying fistula is not excluded. No organized fluid collection. 4. Asymmetric left perinephric fat stranding with associated urothelial thickening. Correlate with urinalysis for infection. 5. Indeterminate 1 cm left exophytic renal lesion. Recommend nonemergent MRI renal protocol for further evaluation. 6. Scattered colonic diverticulosis with no acute diverticulitis. 7.  Aortic Atherosclerosis (ICD10-I70.0). Electronically Signed   By: Iven Finn M.D.   On: 09/24/2020 23:26    EKG: I independently viewed the EKG done and my findings are as followed: Sinus tachycardia at rate of 122 bpm with QTc of 496 ms.  Assessment/Plan Present on Admission:  Sepsis ([place])  Principal Problem:   Sepsis ([place]) Active Problems:   GERD (gastroesophageal reflux disease)   UTI (urinary tract infection)   Thrombocytosis   Leukocytosis   Hypoalbuminemia   Hyponatremia   Hyperglycemia   Lactic acidosis   Prolonged QT interval  Sepsis possible secondary to UTI/possible pneumonia POA Patient met sepsis criteria due to leukocytosis, fever, tachycardia, tachypnea.  UTI (?  Possible pyelonephritis) with urinary > 50 WBC was suspected as cause for infection Chest x-ray was also suggestive of possible pneumonia Lactic acid was 2.1 Patient was empirically started on IV vancomycin and cefepime, Flagyl was given.  We shall continue with same at this  time Continue Tylenol as needed Continue IV Dilaudid 0.5 mg every 4 hours as needed Blood culture and urine culture pending  2. Lactic acidosis in the setting of above-resolved Lactic acid x2-2 0.1 Continue treatment as described above   3. Leukocytosis possibly due to #1 WBC 27.8 continue treatment as described for #1  4.  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 Thrombocytosis possibly reactive Platelets 509, continue to monitor platelet levels  6.  Hyperglycemia due to prediabetes Hemoglobin A1c done 4 weeks ago was 5.7 Continue diet modification  7..  Prolonged QTc (496 ms) Avoid QT prolonging drugs Magnesium level will be checked  8..  Hyponatremia Na 130, corrected sodium level based on hyperglycemia (CBG 205) =132 Continue to monitor sodium levels with morning labs  9.  GERD Continue Protonix  10.  Hypoalbuminemia secondary to moderate protein calorie malnutrition Albumin 3.0; protein supplement to be provided   DVT prophylaxis: Lovenox  Code Status: Full code  Family Communication: None at bedside  Disposition Plan:  Patient is from:                        home Anticipated DC to:                   SNF or family members home Anticipated DC date:               2-3 days Anticipated DC barriers:          Patient requires inpatient management due to sepsis secondary to possible UTI/pneumonia  Consults called: [facility] (per ED team)  Admission status: Inpatient   Bernadette Hoit MD [facility]  09/25/2020, 3:43 AM

## 2020-09-26 LAB — CBC
HCT: 35 % — ABNORMAL LOW (ref 36.0–46.0)
Hemoglobin: 10.9 g/dL — ABNORMAL LOW (ref 12.0–15.0)
MCH: 31.8 pg (ref 26.0–34.0)
MCHC: 31.1 g/dL (ref 30.0–36.0)
MCV: 102 fL — ABNORMAL HIGH (ref 80.0–100.0)
Platelets: 367 10*3/uL (ref 150–400)
RBC: 3.43 MIL/uL — ABNORMAL LOW (ref 3.87–5.11)
RDW: 12 % (ref 11.5–15.5)
WBC: 17.5 10*3/uL — ABNORMAL HIGH (ref 4.0–10.5)
nRBC: 0 % (ref 0.0–0.2)

## 2020-09-26 LAB — URINE CULTURE: Culture: 2000 — AB

## 2020-09-26 LAB — COMPREHENSIVE METABOLIC PANEL
ALT: 8 U/L (ref 0–44)
AST: 12 U/L — ABNORMAL LOW (ref 15–41)
Albumin: 2.5 g/dL — ABNORMAL LOW (ref 3.5–5.0)
Alkaline Phosphatase: 81 U/L (ref 38–126)
Anion gap: 9 (ref 5–15)
BUN: 7 mg/dL — ABNORMAL LOW (ref 8–23)
CO2: 26 mmol/L (ref 22–32)
Calcium: 8 mg/dL — ABNORMAL LOW (ref 8.9–10.3)
Chloride: 100 mmol/L (ref 98–111)
Creatinine, Ser: 0.4 mg/dL — ABNORMAL LOW (ref 0.44–1.00)
GFR, Estimated: >60 mL/min (ref >60)
Glucose, Bld: 135 mg/dL — ABNORMAL HIGH (ref 70–99)
Potassium: 3.2 mmol/L — ABNORMAL LOW (ref 3.5–5.1)
Sodium: 135 mmol/L (ref 135–145)
Total Bilirubin: 0.4 mg/dL (ref 0.3–1.2)
Total Protein: 5.7 g/dL — ABNORMAL LOW (ref 6.5–8.1)

## 2020-09-26 LAB — GLUCOSE, CAPILLARY
Glucose-Capillary: 126 mg/dL — ABNORMAL HIGH (ref 70–99)
Glucose-Capillary: 174 mg/dL — ABNORMAL HIGH (ref 70–99)
Glucose-Capillary: 176 mg/dL — ABNORMAL HIGH (ref 70–99)

## 2020-09-26 LAB — MAGNESIUM: Magnesium: 1.9 mg/dL (ref 1.7–2.4)

## 2020-09-26 MED ORDER — POTASSIUM CHLORIDE 10 MEQ/100ML IV SOLN
10.0000 meq | INTRAVENOUS | Status: AC
  Administered 2020-09-26 (×4): 10 meq via INTRAVENOUS
  Filled 2020-09-26 (×4): qty 100

## 2020-09-26 MED ORDER — POTASSIUM CHLORIDE CRYS ER 20 MEQ PO TBCR
40.0000 meq | EXTENDED_RELEASE_TABLET | Freq: Once | ORAL | Status: AC
  Administered 2020-09-26: 40 meq via ORAL
  Filled 2020-09-26: qty 2

## 2020-09-26 MED ORDER — METOPROLOL TARTRATE 50 MG PO TABS
50.0000 mg | ORAL_TABLET | Freq: Two times a day (BID) | ORAL | Status: DC
  Administered 2020-09-26 – 2020-09-29 (×7): 50 mg via ORAL
  Filled 2020-09-26 (×7): qty 1

## 2020-09-26 NOTE — Progress Notes (Signed)
Patient Demographics:    Renee [name], is a 80 y.o. female, DOB - 05/05/1940, TMA:[id number]  Admit date - 09/24/2020   Admitting Physician Bernadette Hoit, DO  Outpatient Primary MD for the patient is Deland Pretty, MD  LOS - 1   Chief Complaint  Patient presents with   Abdominal Pain    S/p surgery        Subjective:    Renee [name] today has no fevers, no emesis,  No chest pain, Has slight dry cough  Assessment  & Plan :    Principal Problem:   Sepsis ([place]) Active Problems:   GERD (gastroesophageal reflux disease)   UTI (urinary tract infection)   Thrombocytosis   Leukocytosis   Hypoalbuminemia   Hyponatremia   Hyperglycemia   Lactic acidosis   Prolonged QT interval   Brief Summary:- 80 y.o. female with medical history significant for GERD, COPD, type II DM-admitted on 09/25/2020 with sepsis secondary to urinary source and possible pneumonia   A/p   1)Sepsis possible secondary to UTI/possible pneumonia/intra-abdominal wound infection--- POA Patient met sepsis criteria due to leukocytosis, fever, tachycardia, tachypnea.  UTI (?  Possible pyelonephritis) with urinary > 50 WBC was suspected as cause for infection Chest x-ray was also suggestive of possible pneumonia Cough is not worse, may repeat chest x-ray in a.m. 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) CT abdomen and pelvis was done and was reassuring for postop findings after being reviewed by patient's surgeon per ED physician Abdominal drain with serous fluid--fluid culture sent on 09/25/2020 with GPC and GNR -Antibiotics as above #1  3)DM2-A1c 5.7 reflecting excellent diabetic control -No diabetic meds just diet control  4)HypoNatremia/hypokalemia  --- Na 130 >> 133>>135 -Replace potassium -Continue IV fluid   5)  GERD Continue Protonix   6) Hypoalbuminemia secondary to moderate protein calorie malnutrition Albumin 3.0; protein supplement encourage   7) chronic anemia--- hemoglobin is 10.9 which is close to patient's prior baseline -No evidence of ongoing bleeding monitor closely and transfuse as clinically indicated  Disposition/Need for in-Hospital Stay- patient unable to be discharged at this time due to sepsis secondary to intra-abdominal wound infection and pneumonia requiring IV antibiotics pending further culture data*  Status is: Inpatient  Remains inpatient appropriate because: Please see disposition above  Disposition: The patient is from: Home              Anticipated d/c is to: Home              Anticipated d/c date is: 2 days              Patient currently is not medically stable to d/c. Barriers: Not Clinically Stable-   Code Status :  -  Code Status: Full Code   Family Communication:   (patient is alert, awake and coherent)  Consults  :  na  DVT Prophylaxis  :   - SCDs  enoxaparin (LOVENOX) injection 40 mg Start: 09/25/20 0600 SCDs Start: 09/25/20 0155    Lab Results  Component Value Date   PLT 367 09/26/2020    Inpatient  Medications  Scheduled Meds:  Chlorhexidine Gluconate Cloth  6 each Topical Q0600   enoxaparin (LOVENOX) injection  40 mg Subcutaneous Q24H   feeding supplement  237 mL Oral BID BM   metoprolol tartrate  50 mg Oral BID   pantoprazole  40 mg Oral Daily   Continuous Infusions:  sodium chloride 100 mL/hr at 09/26/20 1517   ceFEPime (MAXIPIME) IV Stopped (09/26/20 1010)   vancomycin Stopped (09/25/20 2357)   PRN Meds:.acetaminophen, HYDROmorphone (DILAUDID) injection, prochlorperazine    Anti-infectives (From admission, onward)    Start     Dose/Rate Route Frequency Ordered Stop   09/25/20 2200  vancomycin (VANCOCIN) IVPB 1000 mg/200 mL premix        1,000 mg 200 mL/hr  over 60 Minutes Intravenous Every 24 hours 09/24/20 2142 10/01/20 2159   09/24/20 2200  ceFEPIme (MAXIPIME) 2 g in sodium chloride 0.9 % 100 mL IVPB        2 g 200 mL/hr over 30 Minutes Intravenous Every 12 hours 09/24/20 2136 10/01/20 2159   09/24/20 2145  ceFEPIme (MAXIPIME) 2 g in sodium chloride 0.9 % 100 mL IVPB  Status:  Discontinued        2 g 200 mL/hr over 30 Minutes Intravenous  Once 09/24/20 2130 09/24/20 2136   09/24/20 2145  metroNIDAZOLE (FLAGYL) IVPB 500 mg        500 mg 100 mL/hr over 60 Minutes Intravenous  Once 09/24/20 2130 09/24/20 2308   09/24/20 2145  vancomycin (VANCOCIN) IVPB 1000 mg/200 mL premix  Status:  Discontinued        1,000 mg 200 mL/hr over 60 Minutes Intravenous  Once 09/24/20 2130 09/24/20 2136   09/24/20 2145  vancomycin (VANCOREADY) IVPB 1500 mg/300 mL        1,500 mg 150 mL/hr over 120 Minutes Intravenous  Once 09/24/20 2136 09/25/20 0033         Objective:   Vitals:   09/26/20 1600 09/26/20 1617 09/26/20 1700 09/26/20 1702  BP: 133/69  140/60   Pulse: (!) 104 (!) 104 (!) 105   Resp:  (!) 26  19  Temp:  98.6 F (37 C)    TempSrc:  Oral    SpO2: 96% 96% 95%   Weight:      Height:        Wt Readings from Last 3 Encounters:  09/25/20 74.2 kg  08/30/20 74.6 kg  08/27/20 74.6 kg     Intake/Output Summary (Last 24 hours) at 09/26/2020 1826 Last data filed at 09/26/2020 1517 Gross per 24 hour  Intake 2420.98 ml  Output 30 ml  Net 2390.98 ml     Physical Exam  Gen:- Awake Alert,  in no apparent distress  HEENT:- [place].AT, No sclera icterus Neck-Supple Neck,No JVD,.  Lungs-elevated fair, no wheezing  CV- S1, S2 normal, regular  Abd-  +ve B.Sounds, Abd Soft, left flank JP drain with milky type drainage Extremity/Skin:- No  edema, pedal pulses present  Psych-affect is appropriate, oriented x3 Neuro-generalized weakness, no new focal deficits, no tremors   Data Review:   Micro Results Recent Results (from the past 240 hour(s))   Blood culture (routine x 2)     Status: None (Preliminary result)   Collection Time: 09/24/20  9:39 PM   Specimen: BLOOD LEFT ARM  Result Value Ref Range Status   Specimen Description BLOOD LEFT ARM  Final   Special Requests   Final    BOTTLES DRAWN AEROBIC AND ANAEROBIC Blood Culture adequate  volume   Culture   Final    NO GROWTH 2 DAYS Performed at [facility], [address], [place], [place] [address]    Report Status PENDING  Incomplete  Blood culture (routine x 2)     Status: None (Preliminary result)   Collection Time: 09/24/20  9:47 PM   Specimen: BLOOD LEFT HAND  Result Value Ref Range Status   Specimen Description BLOOD LEFT HAND  Final   Special Requests   Final    BOTTLES DRAWN AEROBIC AND ANAEROBIC Blood Culture adequate volume   Culture   Final    NO GROWTH 2 DAYS Performed at [facility], [address], [place], [place] [address]    Report Status PENDING  Incomplete  Urine Culture     Status: Abnormal   Collection Time: 09/25/20 12:05 AM   Specimen: In/Out Cath Urine  Result Value Ref Range Status   Specimen Description   Final    IN/OUT CATH URINE Performed at [facility], [address], [place], [place] [address]    Special Requests   Final    NONE Performed at [facility], [address] [place], [place] [address]    Culture (A)  Final    2,000 COLONIES/mL STREPTOCOCCUS AGALACTIAE TESTING AGAINST S. AGALACTIAE NOT ROUTINELY PERFORMED DUE TO PREDICTABILITY OF AMP/PEN/VAN SUSCEPTIBILITY. 20,000 COLONIES/mL LACTOBACILLUS SPECIES Standardized susceptibility testing for this organism is not available. Performed at [facility], [place] [address] [place], [place] [address]    Report Status 09/26/2020 FINAL  Final  Resp Panel by RT-PCR (Flu A&B, Covid) Nasopharyngeal Swab     Status: None   Collection Time: 09/25/20  4:45 AM   Specimen: Nasopharyngeal Swab; Nasopharyngeal(NP) swabs in vial transport medium  Result Value Ref Range Status   SARS  Coronavirus 2 by RT PCR NEGATIVE NEGATIVE Final    Comment: (NOTE) SARS-CoV-2 target nucleic acids are NOT DETECTED.  The SARS-CoV-2 RNA is generally detectable in upper respiratory specimens during the acute phase of infection. The lowest concentration of SARS-CoV-2 viral copies this assay can detect is 138 copies/mL. A negative result does not preclude SARS-Cov-2 infection and should not be used as the sole basis for treatment or other patient management decisions. A negative result may occur with  improper specimen collection/handling, submission of specimen other than nasopharyngeal swab, presence of viral mutation(s) within the areas targeted by this assay, and inadequate number of viral copies(<138 copies/mL). A negative result must be combined with clinical observations, patient history, and epidemiological information. The expected result is Negative.  Fact Sheet for Patients:  EntrepreneurPulse.com.au  Fact Sheet for Healthcare Providers:  IncredibleEmployment.be  This test is no t yet approved or cleared by the [place] FDA and  has been authorized for detection and/or diagnosis of SARS-CoV-2 by FDA under an Emergency Use Authorization (EUA). This EUA will remain  in effect (meaning this test can be used) for the duration of the COVID-19 declaration under Section 564(b)(1) of the Act, 21 U.S.C.section 360bbb-3(b)(1), unless the authorization is terminated  or revoked sooner.       Influenza A by PCR NEGATIVE NEGATIVE Final   Influenza B by PCR NEGATIVE NEGATIVE Final    Comment: (NOTE) The Xpert Xpress SARS-CoV-2/FLU/RSV plus assay is intended as an aid in the diagnosis of influenza from Nasopharyngeal swab specimens and should not be used as a sole basis for treatment. Nasal washings and aspirates are unacceptable for Xpert Xpress SARS-CoV-2/FLU/RSV testing.  Fact Sheet for  Patients:  EntrepreneurPulse.com.au  Fact Sheet for Healthcare Providers: IncredibleEmployment.be  This test is not yet approved or cleared by the [place] FDA and has been authorized for detection and/or diagnosis of SARS-CoV-2 by FDA under an Emergency Use Authorization (EUA). This EUA will remain in effect (meaning this test can be used) for the duration of the COVID-19 declaration under Section 564(b)(1) of the Act, 21 U.S.C. section 360bbb-3(b)(1), unless the authorization is terminated or revoked.  Performed at [facility], [address], [place], [place] [address]   MRSA Next Gen by PCR, Nasal     Status: None   Collection Time: 09/25/20  7:45 AM   Specimen: Nasal Mucosa; Nasal Swab  Result Value Ref Range Status   MRSA by PCR Next Gen NOT DETECTED NOT DETECTED Final    Comment: (NOTE) The GeneXpert MRSA Assay (FDA approved for NASAL specimens only), is one component of a comprehensive MRSA colonization surveillance program. It is not intended to diagnose MRSA infection nor to guide or monitor treatment for MRSA infections. Test performance is not FDA approved in patients less than 13 years old. Performed at [facility], [address] [place], [place] [address]   Body fluid culture w Gram Stain     Status: None (Preliminary result)   Collection Time: 09/25/20 10:02 AM   Specimen: JP Drain; Body Fluid  Result Value Ref Range Status   Specimen Description   Final    JP DRAINAGE Performed at [facility], [address] [place], [place] [address]    Special Requests   Final    Immunocompromised Performed at [facility], [address], [place], [place] [address]    Gram Stain   Final    FEW SQUAMOUS EPITHELIAL CELLS PRESENT MODERATE WBC PRESENT,BOTH PMN AND MONONUCLEAR MODERATE GRAM POSITIVE COCCI MODERATE GRAM NEGATIVE RODS    Culture   Final    TOO YOUNG TO READ Performed at [facility], [place] [address],  [place], [place] [address]    Report Status PENDING  Incomplete    Radiology Reports DG Chest 2 View  Result Date: 09/24/2020 CLINICAL DATA:  Cough EXAM: CHEST - 2 VIEW COMPARISON:  02/09/2012 FINDINGS: Small moderate left pleural effusion. Airspace disease at left lung base. Normal cardiac size. Aortic atherosclerosis. No pneumothorax. IMPRESSION: Small moderate left pleural effusion with left basilar atelectasis or pneumonia. Electronically Signed   By: Donavan Foil M.D.   On: 09/24/2020 21:37   CT Angio Chest PE W and/or Wo Contrast  Result Date: 09/24/2020 CLINICAL DATA:  Abdominal pain and fever. Pancreatectomy on 08/30/2020 as well as laparoscopic splenectomy and liver ultrasound. Normal lipase levels. EXAM: CT ANGIOGRAPHY CHEST CT ABDOMEN AND PELVIS WITH CONTRAST TECHNIQUE: Multidetector CT imaging of the chest was performed using the standard protocol during bolus administration of intravenous contrast. Multiplanar CT image reconstructions and MIPs were obtained to evaluate the vascular anatomy. Multidetector CT imaging of the abdomen and pelvis was performed using the standard protocol during bolus administration of intravenous contrast. CONTRAST:  77mL OMNIPAQUE IOHEXOL 350 MG/ML SOLN COMPARISON:  MR abdomen 08/04/2020, CT abdomen pelvis 10/20/2019 FINDINGS: CTA CHEST FINDINGS Cardiovascular: Satisfactory opacification of the pulmonary arteries to the segmental level. No evidence of pulmonary embolism. The main pulmonary artery is normal in caliber. Normal heart size. No significant pericardial effusion. The thoracic aorta is normal in caliber. Moderate to severe calcified and noncalcified atherosclerotic plaque of the thoracic aorta. Coronary artery calcifications. Mediastinum/Nodes: No enlarged mediastinal, hilar, or axillary lymph nodes. Thyroid  gland, trachea, and esophagus demonstrate no significant findings. Lungs/Pleura: Passive atelectasis of the left lower lobe. No focal consolidation. No  pulmonary nodule or mass. No pneumothorax. At least small volume left pleural effusion. No associated pleural thickening or enhancement. No right pleural effusion. Musculoskeletal: No chest wall abnormality. No suspicious lytic or blastic osseous lesions. No acute displaced fracture. Multilevel degenerative changes of the spine. Review of the MIP images confirms the above findings. CT ABDOMEN and PELVIS FINDINGS Hepatobiliary: No focal liver abnormality. No gallstones, gallbladder wall thickening, or pericholecystic fluid. No biliary dilatation. Pancreas: Status post distal pancreatectomy. Associated fat stranding and free fluid within the left upper abdomen. No main pancreatic duct dilatation. Loss of fat plane between pancreatic anastomosis and gastric wall with associated gastric wall thickening (3:25.) Spleen: Status post splenectomy. Adrenals/Urinary Tract: No adrenal nodule bilaterally. Asymmetric left perinephric fat stranding. Bilateral kidneys enhance symmetrically. There is urothelial thickening of the left renal pelvis and ureter. Redemonstration of a 1 cm exophytic left renal lesion with a density of 73 Hounsfield units (6:65, 3:22). No hydronephrosis. No hydroureter. The urinary bladder is unremarkable. Stomach/Bowel: Posterior gastric wall thickening and edema (3: 23-26). No stomach dilatation. Otherwise no evidence of bowel wall thickening or dilatation. Scattered colonic diverticulosis. The appendix not definitely identified. Vascular/Lymphatic: No abdominal aorta or iliac aneurysm. At least moderate atherosclerotic plaque of the aorta and its branches. No abdominal, pelvic, or inguinal lymphadenopathy. Reproductive: Status post hysterectomy. No adnexal masses. Other: Left upper abdomen surgical drain with tip terminating inferior to the expected pancreatic tail region. Trace left upper abdominal and pelvic free fluid. No intraperitoneal free gas. No organized fluid collection. Musculoskeletal: No  abdominal wall hernia or abnormality. Healing anterior abdominal incision. No suspicious lytic or blastic osseous lesions. No acute displaced fracture. Multilevel degenerative changes of the spine. Review of the MIP images confirms the above findings. IMPRESSION: 1. No central or segmental pulmonary embolus. 2. At least small volume left pleural effusion. 3. Status post distal pancreatectomy with associated fat stranding and trace free fluid within the surgical bed. Adhesion of the pancreatic anastomosis and gastric wall i is identified with associated gastric wall thickening/edema. Underlying fistula is not excluded. No organized fluid collection. 4. Asymmetric left perinephric fat stranding with associated urothelial thickening. Correlate with urinalysis for infection. 5. Indeterminate 1 cm left exophytic renal lesion. Recommend nonemergent MRI renal protocol for further evaluation. 6. Scattered colonic diverticulosis with no acute diverticulitis. 7.  Aortic Atherosclerosis (ICD10-I70.0). Electronically Signed   By: Iven Finn M.D.   On: 09/24/2020 23:26   CT ABDOMEN PELVIS W CONTRAST  Result Date: 09/24/2020 CLINICAL DATA:  Abdominal pain and fever. Pancreatectomy on 08/30/2020 as well as laparoscopic splenectomy and liver ultrasound. Normal lipase levels. EXAM: CT ANGIOGRAPHY CHEST CT ABDOMEN AND PELVIS WITH CONTRAST TECHNIQUE: Multidetector CT imaging of the chest was performed using the standard protocol during bolus administration of intravenous contrast. Multiplanar CT image reconstructions and MIPs were obtained to evaluate the vascular anatomy. Multidetector CT imaging of the abdomen and pelvis was performed using the standard protocol during bolus administration of intravenous contrast. CONTRAST:  45mL OMNIPAQUE IOHEXOL 350 MG/ML SOLN COMPARISON:  MR abdomen 08/04/2020, CT abdomen pelvis 10/20/2019 FINDINGS: CTA CHEST FINDINGS Cardiovascular: Satisfactory opacification of the pulmonary arteries  to the segmental level. No evidence of pulmonary embolism. The main pulmonary artery is normal in caliber. Normal heart size. No significant pericardial effusion. The thoracic aorta is normal in caliber. Moderate to severe calcified and noncalcified atherosclerotic plaque of the  thoracic aorta. Coronary artery calcifications. Mediastinum/Nodes: No enlarged mediastinal, hilar, or axillary lymph nodes. Thyroid gland, trachea, and esophagus demonstrate no significant findings. Lungs/Pleura: Passive atelectasis of the left lower lobe. No focal consolidation. No pulmonary nodule or mass. No pneumothorax. At least small volume left pleural effusion. No associated pleural thickening or enhancement. No right pleural effusion. Musculoskeletal: No chest wall abnormality. No suspicious lytic or blastic osseous lesions. No acute displaced fracture. Multilevel degenerative changes of the spine. Review of the MIP images confirms the above findings. CT ABDOMEN and PELVIS FINDINGS Hepatobiliary: No focal liver abnormality. No gallstones, gallbladder wall thickening, or pericholecystic fluid. No biliary dilatation. Pancreas: Status post distal pancreatectomy. Associated fat stranding and free fluid within the left upper abdomen. No main pancreatic duct dilatation. Loss of fat plane between pancreatic anastomosis and gastric wall with associated gastric wall thickening (3:25.) Spleen: Status post splenectomy. Adrenals/Urinary Tract: No adrenal nodule bilaterally. Asymmetric left perinephric fat stranding. Bilateral kidneys enhance symmetrically. There is urothelial thickening of the left renal pelvis and ureter. Redemonstration of a 1 cm exophytic left renal lesion with a density of 73 Hounsfield units (6:65, 3:22). No hydronephrosis. No hydroureter. The urinary bladder is unremarkable. Stomach/Bowel: Posterior gastric wall thickening and edema (3: 23-26). No stomach dilatation. Otherwise no evidence of bowel wall thickening or  dilatation. Scattered colonic diverticulosis. The appendix not definitely identified. Vascular/Lymphatic: No abdominal aorta or iliac aneurysm. At least moderate atherosclerotic plaque of the aorta and its branches. No abdominal, pelvic, or inguinal lymphadenopathy. Reproductive: Status post hysterectomy. No adnexal masses. Other: Left upper abdomen surgical drain with tip terminating inferior to the expected pancreatic tail region. Trace left upper abdominal and pelvic free fluid. No intraperitoneal free gas. No organized fluid collection. Musculoskeletal: No abdominal wall hernia or abnormality. Healing anterior abdominal incision. No suspicious lytic or blastic osseous lesions. No acute displaced fracture. Multilevel degenerative changes of the spine. Review of the MIP images confirms the above findings. IMPRESSION: 1. No central or segmental pulmonary embolus. 2. At least small volume left pleural effusion. 3. Status post distal pancreatectomy with associated fat stranding and trace free fluid within the surgical bed. Adhesion of the pancreatic anastomosis and gastric wall i is identified with associated gastric wall thickening/edema. Underlying fistula is not excluded. No organized fluid collection. 4. Asymmetric left perinephric fat stranding with associated urothelial thickening. Correlate with urinalysis for infection. 5. Indeterminate 1 cm left exophytic renal lesion. Recommend nonemergent MRI renal protocol for further evaluation. 6. Scattered colonic diverticulosis with no acute diverticulitis. 7.  Aortic Atherosclerosis (ICD10-I70.0). Electronically Signed   By: Tish Frederickson M.D.   On: 09/24/2020 23:26     CBC Recent Labs  Lab 09/24/20 2103 09/25/20 0349 09/26/20 0401  WBC 27.8* 27.3* 17.5*  HGB 12.9 12.1 10.9*  HCT 39.6 38.2 35.0*  PLT 509* 432* 367  MCV 100.8* 102.4* 102.0*  MCH 32.8 32.4 31.8  MCHC 32.6 31.7 31.1  RDW 11.9 11.8 12.0  LYMPHSABS 0.7  --   --   MONOABS 2.7*  --   --    EOSABS 0.0  --   --   BASOSABS 0.1  --   --     Chemistries  Recent Labs  Lab 09/24/20 2103 09/25/20 0349 09/26/20 0401  NA 130* 133* 135  K 3.7 3.5 3.2*  CL 96* 98 100  CO2 24 27 26   GLUCOSE 205* 164* 135*  BUN 9 10 7*  CREATININE 0.62 0.54 0.40*  CALCIUM 7.8* 7.9* 8.0*  MG  --  1.8 1.9  AST 23 16 12*  ALT $Re'12 12 8  'eyF$ ALKPHOS 91 81 81  BILITOT 0.6 0.5 0.4   ------------------------------------------------------------------------------------------------------------------ No results for input(s): CHOL, HDL, LDLCALC, TRIG, CHOLHDL, LDLDIRECT in the last 72 hours.  Lab Results  Component Value Date   HGBA1C 5.7 (H) 08/27/2020   ------------------------------------------------------------------------------------------------------------------ No results for input(s): TSH, T4TOTAL, T3FREE, THYROIDAB in the last 72 hours.  Invalid input(s): FREET3 ------------------------------------------------------------------------------------------------------------------ No results for input(s): VITAMINB12, FOLATE, FERRITIN, TIBC, IRON, RETICCTPCT in the last 72 hours.  Coagulation profile Recent Labs  Lab 09/25/20 0349  INR 1.2    No results for input(s): DDIMER in the last 72 hours.  Cardiac Enzymes No results for input(s): CKMB, TROPONINI, MYOGLOBIN in the last 168 hours.  Invalid input(s): CK ------------------------------------------------------------------------------------------------------------------ No results found for: BNP   Roxan Hockey M.D on 09/26/2020 at 6:26 PM  Go to [email or web address] - for contact info  [facility] - Office  [phone]

## 2020-09-27 ENCOUNTER — Inpatient Hospital Stay (HOSPITAL_COMMUNITY): Payer: Medicare Other

## 2020-09-27 DIAGNOSIS — E440 Moderate protein-calorie malnutrition: Secondary | ICD-10-CM | POA: Insufficient documentation

## 2020-09-27 LAB — RENAL FUNCTION PANEL
Albumin: 2.4 g/dL — ABNORMAL LOW (ref 3.5–5.0)
Anion gap: 6 (ref 5–15)
BUN: <5 mg/dL — ABNORMAL LOW (ref 8–23)
CO2: 29 mmol/L (ref 22–32)
Calcium: 8.3 mg/dL — ABNORMAL LOW (ref 8.9–10.3)
Chloride: 104 mmol/L (ref 98–111)
Creatinine, Ser: 0.42 mg/dL — ABNORMAL LOW (ref 0.44–1.00)
GFR, Estimated: >60 mL/min (ref >60)
Glucose, Bld: 138 mg/dL — ABNORMAL HIGH (ref 70–99)
Phosphorus: 1.5 mg/dL — ABNORMAL LOW (ref 2.5–4.6)
Potassium: 4.7 mmol/L (ref 3.5–5.1)
Sodium: 139 mmol/L (ref 135–145)

## 2020-09-27 LAB — CBC
HCT: 36 % (ref 36.0–46.0)
Hemoglobin: 11.4 g/dL — ABNORMAL LOW (ref 12.0–15.0)
MCH: 32.4 pg (ref 26.0–34.0)
MCHC: 31.7 g/dL (ref 30.0–36.0)
MCV: 102.3 fL — ABNORMAL HIGH (ref 80.0–100.0)
Platelets: 412 10*3/uL — ABNORMAL HIGH (ref 150–400)
RBC: 3.52 MIL/uL — ABNORMAL LOW (ref 3.87–5.11)
RDW: 12.1 % (ref 11.5–15.5)
WBC: 10.3 10*3/uL (ref 4.0–10.5)
nRBC: 0 % (ref 0.0–0.2)

## 2020-09-27 MED ORDER — PROSOURCE PLUS PO LIQD
30.0000 mL | Freq: Two times a day (BID) | ORAL | Status: DC
  Administered 2020-09-27 – 2020-09-29 (×3): 30 mL via ORAL
  Filled 2020-09-27 (×3): qty 30

## 2020-09-27 MED ORDER — ONDANSETRON HCL 4 MG/2ML IJ SOLN
4.0000 mg | Freq: Four times a day (QID) | INTRAMUSCULAR | Status: DC | PRN
  Administered 2020-09-27: 4 mg via INTRAVENOUS

## 2020-09-27 MED ORDER — POTASSIUM PHOSPHATES 15 MMOLE/5ML IV SOLN
20.0000 mmol | Freq: Once | INTRAVENOUS | Status: AC
  Administered 2020-09-27: 20 mmol via INTRAVENOUS
  Filled 2020-09-27: qty 6.67

## 2020-09-27 MED ORDER — ONDANSETRON HCL 4 MG/2ML IJ SOLN
INTRAMUSCULAR | Status: AC
  Filled 2020-09-27: qty 2

## 2020-09-27 MED ORDER — OCUVITE-LUTEIN PO CAPS
1.0000 | ORAL_CAPSULE | Freq: Every day | ORAL | Status: DC
  Administered 2020-09-28 – 2020-09-29 (×2): 1 via ORAL
  Filled 2020-09-27 (×2): qty 1

## 2020-09-27 NOTE — Progress Notes (Addendum)
Patient Demographics:    Renee [name], is a 80 y.o. female, DOB - 1940-06-14, JIR:[id number]  Admit date - 09/24/2020   Admitting Physician Bernadette Hoit, DO  Outpatient Primary MD for the patient is Deland Pretty, MD  LOS - 2   Chief Complaint  Patient presents with   Abdominal Pain    S/p surgery        Subjective:    Renee [name] today has no fevers,,  No chest pain, Has slight dry cough -Patient sister at bedside, -Patient with recurrent nausea and 2 episodes of vomiting  Assessment  & Plan :    Principal Problem:   Sepsis ([name]) Active Problems:   GERD (gastroesophageal reflux disease)   UTI (urinary tract infection)   Thrombocytosis   Leukocytosis   Hypoalbuminemia   Hyponatremia   Hyperglycemia   Lactic acidosis   Prolonged QT interval   Malnutrition of moderate degree   Brief Summary:- 80 y.o. female with medical history significant for GERD, COPD, type II DM-admitted on 09/25/2020 with sepsis secondary to urinary source and possible pneumonia   A/p   1)Sepsis possible secondary to  pneumonia/intra-abdominal wound infection--- POA Patient met sepsis criteria due to leukocytosis, fever, tachycardia, tachypnea.  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) CT abdomen and pelvis was done and was reassuring for postop findings after being reviewed by patient's surgeon per ED physician Abdominal drain with serous fluid--fluid culture sent on 09/25/2020 with GPC and GNR--final ID and sensitivity pending -Antibiotics as above #1  3)DM2-A1c 5.7 reflecting excellent  diabetic control -No diabetic meds just diet control  4)HypoNatremia/hypokalemia --- Na 130 >> 133>>135>>139 --Sodium and potassium has normalized -Continue IV fluid   5)  GERD Continue Protonix   6) Hypoalbuminemia secondary to moderate protein calorie malnutrition Albumin 3.0; protein supplement encourage   7) chronic anemia--- hemoglobin is around 11 which is close to patient's prior baseline -No evidence of ongoing bleeding monitor closely and transfuse as clinically indicated  8) nausea and vomiting----given IV fluids, -Get abdominal x-rays  9) generalized weakness and deconditioning----await physical therapy eval most likely will need SNF rehab  Disposition/Need for in-Hospital Stay- patient unable to be discharged at this time due to sepsis secondary to intra-abdominal wound infection and pneumonia requiring IV antibiotics pending further culture data*  Status is: Inpatient  Remains inpatient appropriate because: Please see disposition above  Disposition: The patient is from: Home              Anticipated d/c is to: Home              Anticipated d/c date is: 2 days              Patient currently is not medically stable to d/c. Barriers: Not Clinically Stable-   Code Status :  -  Code Status: Full Code   Family Communication:   (patient is alert, awake and coherent) Discussed with sister at bedside on 09/27/2020 Consults  :  na  DVT Prophylaxis  :   - SCDs  enoxaparin (LOVENOX) injection 40 mg Start: 09/25/20 0600 SCDs Start: 09/25/20 0155    Lab Results  Component Value Date   PLT 412 (H) 09/27/2020    Inpatient Medications  Scheduled Meds:  (feeding supplement) PROSource Plus  30 mL Oral BID BM   Chlorhexidine Gluconate Cloth  6 each Topical Q0600   enoxaparin (LOVENOX) injection  40 mg Subcutaneous Q24H   feeding supplement  237 mL Oral BID BM   metoprolol tartrate  50 mg Oral BID   multivitamin-lutein  1 capsule Oral Daily   pantoprazole  40 mg Oral  Daily   Continuous Infusions:  sodium chloride 40 mL/hr at 09/27/20 1814   ceFEPime (MAXIPIME) IV Stopped (09/27/20 1018)   vancomycin Stopped (09/27/20 0237)   PRN Meds:.acetaminophen, HYDROmorphone (DILAUDID) injection, ondansetron (ZOFRAN) IV, prochlorperazine    Anti-infectives (From admission, onward)    Start     Dose/Rate Route Frequency Ordered Stop   09/25/20 2200  vancomycin (VANCOCIN) IVPB 1000 mg/200 mL premix        1,000 mg 200 mL/hr over 60 Minutes Intravenous Every 24 hours 09/24/20 2142 10/01/20 2159   09/24/20 2200  ceFEPIme (MAXIPIME) 2 g in sodium chloride 0.9 % 100 mL IVPB        2 g 200 mL/hr over 30 Minutes Intravenous Every 12 hours 09/24/20 2136 10/01/20 2159   09/24/20 2145  ceFEPIme (MAXIPIME) 2 g in sodium chloride 0.9 % 100 mL IVPB  Status:  Discontinued        2 g 200 mL/hr over 30 Minutes Intravenous  Once 09/24/20 2130 09/24/20 2136   09/24/20 2145  metroNIDAZOLE (FLAGYL) IVPB 500 mg        500 mg 100 mL/hr over 60 Minutes Intravenous  Once 09/24/20 2130 09/24/20 2308   09/24/20 2145  vancomycin (VANCOCIN) IVPB 1000 mg/200 mL premix  Status:  Discontinued        1,000 mg 200 mL/hr over 60 Minutes Intravenous  Once 09/24/20 2130 09/24/20 2136   09/24/20 2145  vancomycin (VANCOREADY) IVPB 1500 mg/300 mL        1,500 mg 150 mL/hr over 120 Minutes Intravenous  Once 09/24/20 2136 09/25/20 0033         Objective:   Vitals:   09/27/20 0800 09/27/20 1100 09/27/20 1200 09/27/20 1800  BP: (!) 167/70  (!) 154/72 (!) 160/70  Pulse: (!) 106  (!) 102 (!) 105  Resp: _0 Temp: 98.4 F (36.9 C) 98.6 F (37 C) 98.6 F (37 C) 98.9 F (37.2 C)  TempSrc: Oral  Oral Oral  SpO2: 96%  96% 96%  Weight:      Height:        Wt Readings from Last 3 Encounters:  09/25/20 74.2 kg  08/30/20 74.6 kg  08/27/20 74.6 kg     Intake/Output Summary (Last 24 hours) at 09/27/2020 1954 Last data filed at 09/27/2020 1814 Gross per 24 hour  Intake 1915.5 ml   Output 670 ml  Net 1245.5 ml    Physical Exam  Gen:- Awake Alert,  in no apparent distress  HEENT:- [place].AT, No sclera icterus Neck-Supple Neck,No JVD,.  Lungs- fair air movement, no significant wheezing CV- S1, S2 normal, regular  Abd-  +ve B.Sounds, Abd Soft, left flank JP drain with milky type drainage Extremity/Skin:- No  edema, pedal pulses present  Psych-affect is appropriate, oriented x3 Neuro-generalized weakness, no new focal deficits, no tremors  Data Review:   Micro Results Recent Results (from the past 240 hour(s))  Blood culture (routine x 2)     Status: None (Preliminary result)   Collection Time: 09/24/20  9:39 PM   Specimen: BLOOD LEFT ARM  Result Value Ref Range Status   Specimen Description BLOOD LEFT ARM  Final   Special Requests   Final    BOTTLES DRAWN AEROBIC AND ANAEROBIC Blood Culture adequate volume   Culture   Final    NO GROWTH 3 DAYS Performed at [facility], [address], [place], [place] [address]    Report Status PENDING  Incomplete  Blood culture (routine x 2)     Status: None (Preliminary result)   Collection Time: 09/24/20  9:47 PM   Specimen: BLOOD LEFT HAND  Result Value Ref Range Status   Specimen Description BLOOD LEFT HAND  Final   Special Requests   Final    BOTTLES DRAWN AEROBIC AND ANAEROBIC Blood Culture adequate volume   Culture   Final    NO GROWTH 3 DAYS Performed at [facility], [address], [place], [place] [address]    Report Status PENDING  Incomplete  Urine Culture     Status: Abnormal   Collection Time: 09/25/20 12:05 AM   Specimen: In/Out Cath Urine  Result Value Ref Range Status   Specimen Description   Final    IN/OUT CATH URINE Performed at [facility], [address], [place], [place] [address]    Special Requests   Final    NONE Performed at [facility], [address] [place], [place] [address]    Culture (A)  Final    2,000 COLONIES/mL STREPTOCOCCUS AGALACTIAE TESTING AGAINST S. AGALACTIAE NOT  ROUTINELY PERFORMED DUE TO PREDICTABILITY OF AMP/PEN/VAN SUSCEPTIBILITY. 20,000 COLONIES/mL LACTOBACILLUS SPECIES Standardized susceptibility testing for this organism is not available. Performed at [facility], [place] [address] [place], [place] [address]    Report Status 09/26/2020 FINAL  Final  Resp Panel by RT-PCR (Flu A&B, Covid) Nasopharyngeal Swab     Status: None   Collection Time: 09/25/20  4:45 AM   Specimen: Nasopharyngeal Swab; Nasopharyngeal(NP) swabs in vial transport medium  Result Value Ref Range Status   SARS Coronavirus 2 by RT PCR NEGATIVE NEGATIVE Final    Comment: (NOTE) SARS-CoV-2 target nucleic acids are NOT DETECTED.  The SARS-CoV-2 RNA is generally detectable in upper respiratory specimens during the acute phase of infection. The lowest concentration of SARS-CoV-2 viral copies this assay can detect is 138 copies/mL. A negative result does not preclude SARS-Cov-2 infection and should not be used as the sole basis for treatment or other patient management decisions. A negative result may occur with  improper specimen collection/handling, submission of specimen other than nasopharyngeal swab, presence of viral mutation(s) within the areas targeted by this assay, and inadequate number of viral copies(<138 copies/mL). A negative result must be combined with clinical observations, patient history, and epidemiological information. The expected result is Negative.  Fact Sheet for Patients:  EntrepreneurPulse.com.au  Fact Sheet for Healthcare Providers:  IncredibleEmployment.be  This test is no t yet approved or cleared by the [place] FDA and  has been authorized for detection and/or diagnosis of SARS-CoV-2 by FDA under an Emergency Use Authorization (EUA). This EUA will remain  in effect (meaning this test can be used) for the duration of the COVID-19 declaration under Section 564(b)(1) of the Act, 21 U.S.C.section  360bbb-3(b)(1), unless the authorization is terminated  or revoked sooner.  Influenza A by PCR NEGATIVE NEGATIVE Final   Influenza B by PCR NEGATIVE NEGATIVE Final    Comment: (NOTE) The Xpert Xpress SARS-CoV-2/FLU/RSV plus assay is intended as an aid in the diagnosis of influenza from Nasopharyngeal swab specimens and should not be used as a sole basis for treatment. Nasal washings and aspirates are unacceptable for Xpert Xpress SARS-CoV-2/FLU/RSV testing.  Fact Sheet for Patients: EntrepreneurPulse.com.au  Fact Sheet for Healthcare Providers: IncredibleEmployment.be  This test is not yet approved or cleared by the [place] FDA and has been authorized for detection and/or diagnosis of SARS-CoV-2 by FDA under an Emergency Use Authorization (EUA). This EUA will remain in effect (meaning this test can be used) for the duration of the COVID-19 declaration under Section 564(b)(1) of the Act, 21 U.S.C. section 360bbb-3(b)(1), unless the authorization is terminated or revoked.  Performed at [facility], [address] [place], [place] [address]   MRSA Next Gen by PCR, Nasal     Status: None   Collection Time: 09/25/20  7:45 AM   Specimen: Nasal Mucosa; Nasal Swab  Result Value Ref Range Status   MRSA by PCR Next Gen NOT DETECTED NOT DETECTED Final    Comment: (NOTE) The GeneXpert MRSA Assay (FDA approved for NASAL specimens only), is one component of a comprehensive MRSA colonization surveillance program. It is not intended to diagnose MRSA infection nor to guide or monitor treatment for MRSA infections. Test performance is not FDA approved in patients less than 38 years old. Performed at [facility], [address] [place], [place] [address]   Body fluid culture w Gram Stain     Status: None (Preliminary result)   Collection Time: 09/25/20 10:02 AM   Specimen: JP Drain; Body Fluid  Result Value Ref Range Status   Specimen  Description   Final    JP DRAINAGE Performed at [facility], [address] [place], [place] [address]    Special Requests   Final    Immunocompromised Performed at [facility], [address], [place], [place] [address]    Gram Stain   Final    FEW SQUAMOUS EPITHELIAL CELLS PRESENT MODERATE WBC PRESENT,BOTH PMN AND MONONUCLEAR MODERATE GRAM POSITIVE COCCI MODERATE GRAM NEGATIVE RODS    Culture   Final    ABUNDANT STAPHYLOCOCCUS AUREUS ABUNDANT GRAM NEGATIVE RODS IDENTIFICATION AND SUSCEPTIBILITIES TO FOLLOW Performed at [facility], [place] [address], [place], [place] [address]    Report Status PENDING  Incomplete    Radiology Reports DG Chest 2 View  Result Date: 09/27/2020 CLINICAL DATA:  Weakness.  Intermittent shortness of breath. EXAM: CHEST - 2 VIEW COMPARISON:  One-view chest x-ray 09/24/2020 FINDINGS: Heart size normal. Left pleural effusion and lower lobe airspace disease remains. Minimal airspace opacity at the right base has increased slightly. Small right effusion not excluded. Mild pulmonary vascular congestion remains. IMPRESSION: 1. Persistent left pleural effusion and lower lobe airspace disease. 2. Minimal increased airspace opacity at the right base. Electronically Signed   By: San Morelle M.D.   On: 09/27/2020 08:20   DG Chest 2 View  Result Date: 09/24/2020 CLINICAL DATA:  Cough EXAM: CHEST - 2 VIEW COMPARISON:  02/09/2012 FINDINGS: Small moderate left pleural effusion. Airspace disease at left lung base. Normal cardiac size. Aortic atherosclerosis. No pneumothorax. IMPRESSION: Small moderate left pleural effusion with left basilar atelectasis or pneumonia. Electronically Signed   By: Donavan Foil M.D.   On: 09/24/2020 21:37   CT Angio Chest PE W and/or  Wo Contrast  Result Date: 09/24/2020 CLINICAL DATA:  Abdominal pain and fever. Pancreatectomy on 08/30/2020 as well as laparoscopic splenectomy and liver ultrasound. Normal lipase levels. EXAM: CT  ANGIOGRAPHY CHEST CT ABDOMEN AND PELVIS WITH CONTRAST TECHNIQUE: Multidetector CT imaging of the chest was performed using the standard protocol during bolus administration of intravenous contrast. Multiplanar CT image reconstructions and MIPs were obtained to evaluate the vascular anatomy. Multidetector CT imaging of the abdomen and pelvis was performed using the standard protocol during bolus administration of intravenous contrast. CONTRAST:  68m OMNIPAQUE IOHEXOL 350 MG/ML SOLN COMPARISON:  MR abdomen 08/04/2020, CT abdomen pelvis 10/20/2019 FINDINGS: CTA CHEST FINDINGS Cardiovascular: Satisfactory opacification of the pulmonary arteries to the segmental level. No evidence of pulmonary embolism. The main pulmonary artery is normal in caliber. Normal heart size. No significant pericardial effusion. The thoracic aorta is normal in caliber. Moderate to severe calcified and noncalcified atherosclerotic plaque of the thoracic aorta. Coronary artery calcifications. Mediastinum/Nodes: No enlarged mediastinal, hilar, or axillary lymph nodes. Thyroid gland, trachea, and esophagus demonstrate no significant findings. Lungs/Pleura: Passive atelectasis of the left lower lobe. No focal consolidation. No pulmonary nodule or mass. No pneumothorax. At least small volume left pleural effusion. No associated pleural thickening or enhancement. No right pleural effusion. Musculoskeletal: No chest wall abnormality. No suspicious lytic or blastic osseous lesions. No acute displaced fracture. Multilevel degenerative changes of the spine. Review of the MIP images confirms the above findings. CT ABDOMEN and PELVIS FINDINGS Hepatobiliary: No focal liver abnormality. No gallstones, gallbladder wall thickening, or pericholecystic fluid. No biliary dilatation. Pancreas: Status post distal pancreatectomy. Associated fat stranding and free fluid within the left upper abdomen. No main pancreatic duct dilatation. Loss of fat plane between  pancreatic anastomosis and gastric wall with associated gastric wall thickening (3:25.) Spleen: Status post splenectomy. Adrenals/Urinary Tract: No adrenal nodule bilaterally. Asymmetric left perinephric fat stranding. Bilateral kidneys enhance symmetrically. There is urothelial thickening of the left renal pelvis and ureter. Redemonstration of a 1 cm exophytic left renal lesion with a density of 73 Hounsfield units (6:65, 3:22). No hydronephrosis. No hydroureter. The urinary bladder is unremarkable. Stomach/Bowel: Posterior gastric wall thickening and edema (3: 23-26). No stomach dilatation. Otherwise no evidence of bowel wall thickening or dilatation. Scattered colonic diverticulosis. The appendix not definitely identified. Vascular/Lymphatic: No abdominal aorta or iliac aneurysm. At least moderate atherosclerotic plaque of the aorta and its branches. No abdominal, pelvic, or inguinal lymphadenopathy. Reproductive: Status post hysterectomy. No adnexal masses. Other: Left upper abdomen surgical drain with tip terminating inferior to the expected pancreatic tail region. Trace left upper abdominal and pelvic free fluid. No intraperitoneal free gas. No organized fluid collection. Musculoskeletal: No abdominal wall hernia or abnormality. Healing anterior abdominal incision. No suspicious lytic or blastic osseous lesions. No acute displaced fracture. Multilevel degenerative changes of the spine. Review of the MIP images confirms the above findings. IMPRESSION: 1. No central or segmental pulmonary embolus. 2. At least small volume left pleural effusion. 3. Status post distal pancreatectomy with associated fat stranding and trace free fluid within the surgical bed. Adhesion of the pancreatic anastomosis and gastric wall i is identified with associated gastric wall thickening/edema. Underlying fistula is not excluded. No organized fluid collection. 4. Asymmetric left perinephric fat stranding with associated urothelial  thickening. Correlate with urinalysis for infection. 5. Indeterminate 1 cm left exophytic renal lesion. Recommend nonemergent MRI renal protocol for further evaluation. 6. Scattered colonic diverticulosis with no acute diverticulitis. 7.  Aortic Atherosclerosis (ICD10-I70.0). Electronically Signed  By: Iven Finn M.D.   On: 09/24/2020 23:26   CT ABDOMEN PELVIS W CONTRAST  Result Date: 09/24/2020 CLINICAL DATA:  Abdominal pain and fever. Pancreatectomy on 08/30/2020 as well as laparoscopic splenectomy and liver ultrasound. Normal lipase levels. EXAM: CT ANGIOGRAPHY CHEST CT ABDOMEN AND PELVIS WITH CONTRAST TECHNIQUE: Multidetector CT imaging of the chest was performed using the standard protocol during bolus administration of intravenous contrast. Multiplanar CT image reconstructions and MIPs were obtained to evaluate the vascular anatomy. Multidetector CT imaging of the abdomen and pelvis was performed using the standard protocol during bolus administration of intravenous contrast. CONTRAST:  21m OMNIPAQUE IOHEXOL 350 MG/ML SOLN COMPARISON:  MR abdomen 08/04/2020, CT abdomen pelvis 10/20/2019 FINDINGS: CTA CHEST FINDINGS Cardiovascular: Satisfactory opacification of the pulmonary arteries to the segmental level. No evidence of pulmonary embolism. The main pulmonary artery is normal in caliber. Normal heart size. No significant pericardial effusion. The thoracic aorta is normal in caliber. Moderate to severe calcified and noncalcified atherosclerotic plaque of the thoracic aorta. Coronary artery calcifications. Mediastinum/Nodes: No enlarged mediastinal, hilar, or axillary lymph nodes. Thyroid gland, trachea, and esophagus demonstrate no significant findings. Lungs/Pleura: Passive atelectasis of the left lower lobe. No focal consolidation. No pulmonary nodule or mass. No pneumothorax. At least small volume left pleural effusion. No associated pleural thickening or enhancement. No right pleural effusion.  Musculoskeletal: No chest wall abnormality. No suspicious lytic or blastic osseous lesions. No acute displaced fracture. Multilevel degenerative changes of the spine. Review of the MIP images confirms the above findings. CT ABDOMEN and PELVIS FINDINGS Hepatobiliary: No focal liver abnormality. No gallstones, gallbladder wall thickening, or pericholecystic fluid. No biliary dilatation. Pancreas: Status post distal pancreatectomy. Associated fat stranding and free fluid within the left upper abdomen. No main pancreatic duct dilatation. Loss of fat plane between pancreatic anastomosis and gastric wall with associated gastric wall thickening (3:25.) Spleen: Status post splenectomy. Adrenals/Urinary Tract: No adrenal nodule bilaterally. Asymmetric left perinephric fat stranding. Bilateral kidneys enhance symmetrically. There is urothelial thickening of the left renal pelvis and ureter. Redemonstration of a 1 cm exophytic left renal lesion with a density of 73 Hounsfield units (6:65, 3:22). No hydronephrosis. No hydroureter. The urinary bladder is unremarkable. Stomach/Bowel: Posterior gastric wall thickening and edema (3: 23-26). No stomach dilatation. Otherwise no evidence of bowel wall thickening or dilatation. Scattered colonic diverticulosis. The appendix not definitely identified. Vascular/Lymphatic: No abdominal aorta or iliac aneurysm. At least moderate atherosclerotic plaque of the aorta and its branches. No abdominal, pelvic, or inguinal lymphadenopathy. Reproductive: Status post hysterectomy. No adnexal masses. Other: Left upper abdomen surgical drain with tip terminating inferior to the expected pancreatic tail region. Trace left upper abdominal and pelvic free fluid. No intraperitoneal free gas. No organized fluid collection. Musculoskeletal: No abdominal wall hernia or abnormality. Healing anterior abdominal incision. No suspicious lytic or blastic osseous lesions. No acute displaced fracture. Multilevel  degenerative changes of the spine. Review of the MIP images confirms the above findings. IMPRESSION: 1. No central or segmental pulmonary embolus. 2. At least small volume left pleural effusion. 3. Status post distal pancreatectomy with associated fat stranding and trace free fluid within the surgical bed. Adhesion of the pancreatic anastomosis and gastric wall i is identified with associated gastric wall thickening/edema. Underlying fistula is not excluded. No organized fluid collection. 4. Asymmetric left perinephric fat stranding with associated urothelial thickening. Correlate with urinalysis for infection. 5. Indeterminate 1 cm left exophytic renal lesion. Recommend nonemergent MRI renal protocol for further evaluation. 6. Scattered colonic  diverticulosis with no acute diverticulitis. 7.  Aortic Atherosclerosis (ICD10-I70.0). Electronically Signed   By: Iven Finn M.D.   On: 09/24/2020 23:26     CBC Recent Labs  Lab 09/24/20 2103 09/25/20 0349 09/26/20 0401 09/27/20 0511  WBC 27.8* 27.3* 17.5* 10.3  HGB 12.9 12.1 10.9* 11.4*  HCT 39.6 38.2 35.0* 36.0  PLT 509* 432* 367 412*  MCV 100.8* 102.4* 102.0* 102.3*  MCH 32.8 32.4 31.8 32.4  MCHC 32.6 31.7 31.1 31.7  RDW 11.9 11.8 12.0 12.1  LYMPHSABS 0.7  --   --   --   MONOABS 2.7*  --   --   --   EOSABS 0.0  --   --   --   BASOSABS 0.1  --   --   --     Chemistries  Recent Labs  Lab 09/24/20 2103 09/25/20 0349 09/26/20 0401 09/27/20 0511  NA 130* 133* 135 139  K 3.7 3.5 3.2* 4.7  CL 96* 98 100 104  CO2 _0 GLUCOSE 205* 164* 135* 138*  BUN 9 10 7* <5*  CREATININE 0.62 0.54 0.40* 0.42*  CALCIUM 7.8* 7.9* 8.0* 8.3*  MG  --  1.8 1.9  --   AST 23 16 12*  --   ALT _1 --   ALKPHOS 91 81 81  --   BILITOT 0.6 0.5 0.4  --    ------------------------------------------------------------------------------------------------------------------ No results for input(s): CHOL, HDL, LDLCALC, TRIG, CHOLHDL, LDLDIRECT in the  last 72 hours.  Lab Results  Component Value Date   HGBA1C 5.7 (H) 08/27/2020   ------------------------------------------------------------------------------------------------------------------ No results for input(s): TSH, T4TOTAL, T3FREE, THYROIDAB in the last 72 hours.  Invalid input(s): FREET3 ------------------------------------------------------------------------------------------------------------------ No results for input(s): VITAMINB12, FOLATE, FERRITIN, TIBC, IRON, RETICCTPCT in the last 72 hours.  Coagulation profile Recent Labs  Lab 09/25/20 0349  INR 1.2    No results for input(s): DDIMER in the last 72 hours.  Cardiac Enzymes No results for input(s): CKMB, TROPONINI, MYOGLOBIN in the last 168 hours.  Invalid input(s): CK ------------------------------------------------------------------------------------------------------------------ No results found for: BNP   Roxan Hockey M.D on 09/27/2020 at 7:54 PM  Go to [email or web address] - for contact info  [facility] - Office  [phone]

## 2020-09-27 NOTE — Progress Notes (Signed)
PT Cancellation Note  Patient Details Name: Renee [name] MRN: [id number] DOB: 03/28/1940   Cancelled Treatment:    Reason Eval/Treat Not Completed: Medical issues which prohibited therapy  Nursing staff went to transfer pt to bedside commode and pt became extremely nauseated right before therapist entered the room. Rayetta Humphrey, [place] CLT [phone]  09/27/2020, 4:15 PM

## 2020-09-27 NOTE — Progress Notes (Signed)
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 Diminished appetite following surgery discharged 8/9 and persisting. Returns with sepsis- UTI and possible pna) as evidenced by per patient/family report, energy intake < 75% for > 7 days, mild fat depletion, mild muscle depletion.   GOAL:  Provide needs based on ASPEN/SCCM guidelines   MONITOR:  PO intake, Supplement acceptance, Labs, I & O's, Weight trends  REASON FOR ASSESSMENT:   Malnutrition Screening Tool    ASSESSMENT: Patient is an 80 yo female with hx of DM2, GERD, COPD.  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 Diminished appetite following surgery discharged 8/9.   She presents with complaint of abdominal pain. Patient  with sepsis related to UTI and possible PNA.   Patient had a few bites of a grilled cheese sandwich and bites of chicken noodle soup for lunch. She has not eaten well all month. Her appetite has not returned to normal since prior to surgery.   Patient weighed 164 lb at the beginning of month and currently weighs 163 lb. Likely her LE edema is masking actual weight.  Medications reviewed and include: Protonix, Lopressor IVF-NS @ 40 ml/hr  Drips: Maxipime  Labs: BMP Latest Ref Rng & Units 09/27/2020 09/26/2020 09/25/2020  Glucose 70 - 99 mg/dL 138(H) 135(H) 164(H)  BUN 8 - 23 mg/dL <5(L) 7(L) 10  Creatinine 0.44 - 1.00 mg/dL 0.42(L) 0.40(L) 0.54  BUN/Creat Ratio 12 - 28 - - -  Sodium 135 - 145 mmol/L 139 135 133(L)  Potassium 3.5 - 5.1 mmol/L 4.7 3.2(L) 3.5  Chloride 98 -  111 mmol/L 104 100 98  CO2 22 - 32 mmol/L 29 26 27   Calcium 8.9 - 10.3 mg/dL 8.3(L) 8.0(L) 7.9(L)      NUTRITION - FOCUSED PHYSICAL EXAM:  Nutrition-Focused physical exam completed. Findings are mild buccal, orbital fat depletion, mild clavicle, deltoid muscle depletion, and mild LE edema.    Diet Order:   Diet Order             Diet heart healthy/carb modified Room service appropriate? Yes; Fluid consistency: Thin  Diet effective now                   EDUCATION NEEDS:  Education needs have been addressed  Skin:  Skin Assessment: Skin Integrity Issues: Skin Integrity Issues:: Incisions Incisions: closed abdominal incision from surgery 8/1. Drain in place. Patient reports 15-30 ml daily output.   Last BM:  8/29  Height:   Ht Readings from Last 1 Encounters:  09/25/20 5\' 3"  (1.6 m)    Weight:   Wt Readings from Last 1 Encounters:  09/25/20 74.2 kg    Ideal Body Weight:   52 kg  BMI:  Body mass index is 28.98 kg/m.  Estimated Nutritional Needs:   Kcal:  1600-1700  Protein:  90-98  Fluid:  >1700 ml daily   Colman Cater MS,RD,CSG,LDN Contact: Shea Evans

## 2020-09-28 DIAGNOSIS — J189 Pneumonia, unspecified organism: Secondary | ICD-10-CM

## 2020-09-28 DIAGNOSIS — R0600 Dyspnea, unspecified: Secondary | ICD-10-CM

## 2020-09-28 DIAGNOSIS — R0689 Other abnormalities of breathing: Secondary | ICD-10-CM

## 2020-09-28 LAB — AMYLASE, PLEURAL OR PERITONEAL FLUID: Amylase, Fluid: >10000 U/L

## 2020-09-28 LAB — RENAL FUNCTION PANEL
Albumin: 2.4 g/dL — ABNORMAL LOW (ref 3.5–5.0)
Anion gap: 7 (ref 5–15)
BUN: <5 mg/dL — ABNORMAL LOW (ref 8–23)
CO2: 30 mmol/L (ref 22–32)
Calcium: 8.5 mg/dL — ABNORMAL LOW (ref 8.9–10.3)
Chloride: 100 mmol/L (ref 98–111)
Creatinine, Ser: 0.45 mg/dL (ref 0.44–1.00)
GFR, Estimated: >60 mL/min (ref >60)
Glucose, Bld: 156 mg/dL — ABNORMAL HIGH (ref 70–99)
Phosphorus: 3 mg/dL (ref 2.5–4.6)
Potassium: 4 mmol/L (ref 3.5–5.1)
Sodium: 137 mmol/L (ref 135–145)

## 2020-09-28 LAB — CBC
HCT: 37 % (ref 36.0–46.0)
Hemoglobin: 11.8 g/dL — ABNORMAL LOW (ref 12.0–15.0)
MCH: 32.2 pg (ref 26.0–34.0)
MCHC: 31.9 g/dL (ref 30.0–36.0)
MCV: 100.8 fL — ABNORMAL HIGH (ref 80.0–100.0)
Platelets: 457 10*3/uL — ABNORMAL HIGH (ref 150–400)
RBC: 3.67 MIL/uL — ABNORMAL LOW (ref 3.87–5.11)
RDW: 11.9 % (ref 11.5–15.5)
WBC: 7.9 10*3/uL (ref 4.0–10.5)
nRBC: 0 % (ref 0.0–0.2)

## 2020-09-28 LAB — VANCOMYCIN, TROUGH: Vancomycin Tr: 5 ug/mL — ABNORMAL LOW (ref 15–20)

## 2020-09-28 MED ORDER — VANCOMYCIN HCL 750 MG/150ML IV SOLN
750.0000 mg | Freq: Two times a day (BID) | INTRAVENOUS | Status: DC
  Administered 2020-09-29: 750 mg via INTRAVENOUS
  Filled 2020-09-28: qty 150

## 2020-09-28 MED ORDER — SACCHAROMYCES BOULARDII 250 MG PO CAPS
250.0000 mg | ORAL_CAPSULE | Freq: Two times a day (BID) | ORAL | Status: DC
  Administered 2020-09-28 – 2020-09-29 (×3): 250 mg via ORAL
  Filled 2020-09-28 (×3): qty 1

## 2020-09-28 NOTE — Progress Notes (Signed)
Chaplain engaged in an initial visit with Renee [name] and her sister, Renee [name].  Chaplain offered listening and presence as she learned about their relationship and what is currently happening in their life.  Renee [name] and her sister both expressed concern about her returning home without all the help she needs to get well.  Renee [name] was able to ask her the physician questions and gain clarity.  The physician shared that PT would evaluate her and further understand her needs before discharging her out of the hospital.  Renee [name] really does not want to have to come back or be miserable at home feeling as if she is going to die.    Chaplain and Renee [name] also discussed Advanced Directive care planning.  Renee [name] does not desire to appoint a healthcare agent, but does desire to express what her needs are to medical staff.  Outside of completing Advanced Directive, Chaplain believes it will be important for Renee [name] to complete the MOST form or talk with the physician about DNR.  Though Renee [name] does not believe those decisions will need to happen anytime soon she does want to begin the planning for it and make those decisions for herself.  Renee [name]'s Advanced Directive is complete but Chaplain was unable to secure a notary.   Chaplain offered listening, presence and support.     09/28/20 1200  Clinical Encounter Type  Visited With Patient and family together  Visit Type Initial

## 2020-09-28 NOTE — Plan of Care (Signed)
  Problem: Acute Rehab PT Goals(only PT should resolve) Goal: Pt Will Go Supine/Side To Sit Outcome: Progressing Flowsheets (Taken 09/28/2020 1419) Pt will go Supine/Side to Sit: with modified independence Goal: Patient Will Transfer Sit To/From Stand Outcome: Progressing Flowsheets (Taken 09/28/2020 1419) Patient will transfer sit to/from stand: with modified independence Goal: Pt Will Transfer Bed To Chair/Chair To Bed Outcome: Progressing Flowsheets (Taken 09/28/2020 1419) Pt will Transfer Bed to Chair/Chair to Bed: with modified independence Goal: Pt Will Ambulate Outcome: Progressing Flowsheets (Taken 09/28/2020 1419) Pt will Ambulate:  100 feet  with modified independence  with cane   2:19 PM, 09/28/20 Lonell Grandchild, MPT Physical Therapist with [facility] [phone] office [phone] mobile phone

## 2020-09-28 NOTE — NC FL2 (Signed)
[place] MEDICAID FL2 LEVEL OF CARE SCREENING TOOL     IDENTIFICATION  Patient Name: Renee [name] Birthdate: 11-17-1940 Sex: female Admission Date (Current Location): 09/24/2020  [facility] and [place] Number:  [employer] and Address:  [facility] [address] [place]      Provider Number: [id number]  Attending Physician Name and Address:  Barton Dubois, MD  Relative Name and Phone Number:       Current Level of Care: Hospital Recommended Level of Care: [place] Prior Approval Number:    Date Approved/Denied:   PASRR Number: [id number]  Discharge Plan: SNF    Current Diagnoses: Patient Active Problem List   Diagnosis Date Noted   Malnutrition of moderate degree 09/27/2020   Sepsis ([place]) 09/25/2020   UTI (urinary tract infection) 09/25/2020   Thrombocytosis 09/25/2020   Leukocytosis 09/25/2020   Hypoalbuminemia 09/25/2020   Hyponatremia 09/25/2020   Hyperglycemia 09/25/2020   Lactic acidosis 09/25/2020   Prolonged QT interval 09/25/2020   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   Primary localized osteoarthritis of left hip 08/09/2020   RBBB 07/05/2020   Dizziness 07/05/2020   Lumbar pain 10/08/2017   Cervical spine pain 02/21/2017   Carotid stenosis 07/23/2015   Multiple pulmonary nodules 02/11/2015   Essential hypertension 07/08/2013   Sinus tachycardia 01/08/2013   Difficulty walking 11/04/2012   Pain of left hip joint 11/04/2012   GERD (gastroesophageal reflux disease) 10/15/2012   Hyperlipidemia 10/15/2012   Dyspnea 02/09/2012    Orientation RESPIRATION BLADDER Height & Weight     Self, Time, Situation, Place  Normal Continent Weight: 162 lb 14.7 oz (73.9 kg) Height:  5\' 3"  (160 cm)  BEHAVIORAL SYMPTOMS/MOOD NEUROLOGICAL BOWEL NUTRITION STATUS      Continent Diet (Heart healthy/carb modified. See d/c summary for updates.)  AMBULATORY  STATUS COMMUNICATION OF NEEDS Skin   Extensive Assist Verbally Bruising, Surgical wounds                       Personal Care Assistance Level of Assistance  Bathing, Dressing, Feeding Bathing Assistance: Maximum assistance Feeding assistance: Limited assistance Dressing Assistance: Maximum assistance     Functional Limitations Info  Sight, Hearing, Speech Sight Info: Impaired Hearing Info: Adequate Speech Info: Adequate    SPECIAL CARE FACTORS FREQUENCY  PT (By licensed PT)     PT Frequency: 5x weekly              Contractures      Additional Factors Info  Code Status, Allergies Code Status Info: Full code Allergies Info: Atorvastatin, Codeine, Ibuprofen, Lisinopril, Aspirin           Current Medications (09/28/2020):  This is the current hospital active medication list Current Facility-Administered Medications  Medication Dose Route Frequency Provider Last Rate Last Admin   (feeding supplement) PROSource Plus liquid 30 mL  30 mL Oral BID BM Emokpae, Courage, MD   30 mL at 09/28/20 1134   0.9 %  sodium chloride infusion   Intravenous Continuous Emokpae, Courage, MD 40 mL/hr at 09/27/20 2043 Infusion Verify at 09/27/20 2043   acetaminophen (TYLENOL) tablet 650 mg  650 mg Oral Q6H PRN Adefeso, Oladapo, DO   650 mg at 09/27/20 0241   ceFEPIme (MAXIPIME) 2 g in sodium chloride 0.9 % 100 mL IVPB  2 g Intravenous Q12H Adefeso, Oladapo, DO 200 mL/hr at 09/28/20 1146 2 g at 09/28/20 1146  Chlorhexidine Gluconate Cloth 2 % PADS 6 each  6 each Topical Q0600 Adefeso, Oladapo, DO   6 each at 09/28/20 0459   enoxaparin (LOVENOX) injection 40 mg  40 mg Subcutaneous Q24H Adefeso, Oladapo, DO   40 mg at 09/28/20 0929   feeding supplement (ENSURE ENLIVE / ENSURE PLUS) liquid 237 mL  237 mL Oral BID BM Emokpae, Courage, MD   237 mL at 09/27/20 1558   HYDROmorphone (DILAUDID) injection 0.5 mg  0.5 mg Intravenous Q4H PRN Adefeso, Oladapo, DO       metoprolol tartrate (LOPRESSOR)  tablet 50 mg  50 mg Oral BID Denton Brick, Courage, MD   50 mg at 09/28/20 1134   multivitamin-lutein (OCUVITE-LUTEIN) capsule 1 capsule  1 capsule Oral Daily Emokpae, Courage, MD   1 capsule at 09/28/20 1134   ondansetron (ZOFRAN) injection 4 mg  4 mg Intravenous Q6H PRN Emokpae, Courage, MD   4 mg at 09/27/20 1620   pantoprazole (PROTONIX) EC tablet 40 mg  40 mg Oral Daily Adefeso, Oladapo, DO   40 mg at 09/28/20 1134   prochlorperazine (COMPAZINE) suppository 25 mg  25 mg Rectal Q12H PRN Roxan Hockey, MD   25 mg at 09/27/20 1722   saccharomyces boulardii (FLORASTOR) capsule 250 mg  250 mg Oral BID Barton Dubois, MD   250 mg at 09/28/20 1134   vancomycin (VANCOCIN) IVPB 1000 mg/200 mL premix  1,000 mg Intravenous Q24H Adefeso, Oladapo, DO 200 mL/hr at 09/28/20 0137 1,000 mg at 09/28/20 0137     Discharge Medications: Please see discharge summary for a list of discharge medications.  Relevant Imaging Results:  Relevant Lab Results:   Additional Information SSN: [id number]. Pt reports she received Moderna COVID vaccines, including booster.  Salome Arnt, LCSW

## 2020-09-28 NOTE — TOC Initial Note (Signed)
Transition of Care [facility]) - Initial/Assessment Note    Patient Details  Name: Renee [name] MRN: [id number] Date of Birth: Apr 29, 1940  Transition of Care [facility]) CM/SW Contact:    Salome Arnt, [place] Phone Number: 09/28/2020, 11:56 AM  Clinical Narrative:  Pt admitted due to sepsis. LCSW completed assessment with pt who reports she lives with her husband. PT evaluated pt and recommend SNF. Pt states her husband is unable to help her at home, so she will need rehab prior to returning home. Pt requests [facility] or [facility] SNF. Referral sent. TOC will follow up with bed offers. CMA to start auth.                  Expected Discharge Plan: Skilled Nursing Facility Barriers to Discharge: Continued Medical Work up   Patient Goals and CMS Choice Patient states their goals for this hospitalization and ongoing recovery are:: SNF   Choice offered to / list presented to : Patient  Expected Discharge Plan and Services Expected Discharge Plan: [place] In-house Referral: Clinical Social Work   Post Acute Care Choice: [place] Living arrangements for the past 2 months: [place]                 DME Arranged: N/A                    Prior Living Arrangements/Services Living arrangements for the past 2 months: Single Family Home Lives with:: Spouse Patient language and need for interpreter reviewed:: Yes Do you feel safe going back to the place where you live?: Yes      Need for Family Participation in Patient Care: No (Comment)     Criminal Activity/Legal Involvement Pertinent to Current Situation/Hospitalization: No - Comment as needed  Activities of Daily Living Home Assistive Devices/Equipment: Cane (specify quad or straight) ADL Screening (condition at time of admission) Patient's cognitive ability adequate to safely complete daily activities?: Yes Is the patient deaf or have difficulty hearing?: No Does the patient have difficulty  seeing, even when wearing glasses/contacts?: No Does the patient have difficulty concentrating, remembering, or making decisions?: No Patient able to express need for assistance with ADLs?: Yes Does the patient have difficulty dressing or bathing?: No Independently performs ADLs?: Yes (appropriate for developmental age) Communication: Independent Dressing (OT): Independent Grooming: Independent Feeding: Independent Bathing: Independent Toileting: Independent In/Out Bed: Independent Walks in Home: Independent Does the patient have difficulty walking or climbing stairs?: No Weakness of Legs: Both Weakness of Arms/Hands: None  Permission Sought/Granted                  Emotional Assessment   Attitude/Demeanor/Rapport: Engaged Affect (typically observed): Accepting Orientation: : Oriented to Self, Oriented to Place, Oriented to  Time, Oriented to Situation Alcohol / Substance Use: Not Applicable Psych Involvement: No (comment)  Admission diagnosis:  Sepsis ([place]) [A41.9] Sepsis, due to unspecified organism, unspecified whether acute organ dysfunction present [facility]) [A41.9] Patient Active Problem List   Diagnosis Date Noted   Malnutrition of moderate degree 09/27/2020   Sepsis ([place]) 09/25/2020   UTI (urinary tract infection) 09/25/2020   Thrombocytosis 09/25/2020   Leukocytosis 09/25/2020   Hypoalbuminemia 09/25/2020   Hyponatremia 09/25/2020   Hyperglycemia 09/25/2020   Lactic acidosis 09/25/2020   Prolonged QT interval 09/25/2020   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 [place] [address] Phone: [phone] Fax: [phone]  [place] [facility] [address] [place] [address] Phone: [phone] Fax: [phone]     Social Determinants of Health (SDOH) Interventions    Readmission Risk Interventions No flowsheet data found.

## 2020-09-28 NOTE — Evaluation (Signed)
Physical Therapy Evaluation Patient Details Name: Renee [name] MRN: [id number] DOB: September 17, 1940 Today's Date: 09/28/2020   History of Present Illness  Renee [name] is a 80 y.o. female with medical history significant for GERD, COPD, type II DM who presents to the emergency department due to about 1 week onset of abdominal pain and left-sided back pain.  Patient states that she had a spleen and partial pancreatic removal on 09/10/2020, she recently had a postop follow-up with her surgeon and she has a forthcoming follow-up with the surgeon's office, she complained of abdominal pain which was sharp in nature and left flank pain which was of moderate intensity.  This was associated with some shortness of breath especially when she takes deep breath.  She endorsed subjective fever at home, but denies chest pain, constipation, diarrhea, painful urination or urinary frequency.   Clinical Impression  Patient demonstrates slow labored movement for functional mobility and ambulation, required hand held assist for walking in room and hallway without loss of balance, but limited mostly due to generalized weakness and fatigue.  Patient tolerated sitting up at bedside after therapy with family member present in room.  Patient will benefit from continued physical therapy in hospital and recommended venue below to increase strength, balance, endurance for safe ADLs and gait.     Follow Up Recommendations SNF;Supervision for mobility/OOB;Supervision - Intermittent    Equipment Recommendations  None recommended by PT    Recommendations for Other Services       Precautions / Restrictions Precautions Precautions: Fall Restrictions Weight Bearing Restrictions: No      Mobility  Bed Mobility Overal bed mobility: Needs Assistance Bed Mobility: Supine to Sit;Sit to Supine;Rolling Rolling: Supervision   Supine to sit: Supervision Sit to supine: Supervision   General bed mobility comments: increased  time with labored movement with HOB flat    Transfers Overall transfer level: Needs assistance Equipment used: 1 person hand held assist Transfers: Sit to/from Stand;Stand Pivot Transfers Sit to Stand: Min guard Stand pivot transfers: Min guard       General transfer comment: slow labored movement  Ambulation/Gait Ambulation/Gait assistance: Min guard;Min assist Gait Distance (Feet): 50 Feet Assistive device: 1 person hand held assist Gait Pattern/deviations: Decreased step length - right;Decreased step length - left;Decreased stride length Gait velocity: decreased   General Gait Details: slow labored cadence without loss of balance, limited mostly due to fatigue  Stairs            Wheelchair Mobility    Modified Rankin (Stroke Patients Only)       Balance Overall balance assessment: Needs assistance Sitting-balance support: Feet supported;No upper extremity supported Sitting balance-Leahy Scale: Good Sitting balance - Comments: seated at EOB   Standing balance support: During functional activity;No upper extremity supported Standing balance-Leahy Scale: Fair Standing balance comment: hand held assist                             Pertinent Vitals/Pain Pain Assessment: Faces Faces Pain Scale: Hurts a little bit Pain Location: abdomen Pain Descriptors / Indicators: Sore Pain Intervention(s): Limited activity within patient's tolerance;Monitored during session;Repositioned    Home Living Family/patient expects to be discharged to:: Private residence Living Arrangements: Spouse/significant other Available Help at Discharge: Family;Available PRN/intermittently Type of Home: House Home Access: Ramped entrance;Stairs to enter (ramped entrance into garage) Entrance Stairs-Rails: None Entrance Stairs-Number of Steps: 3 steps in front Home Layout: One level Home Equipment: Walker - 2  wheels;Cane - single point;Bedside commode;Shower seat      Prior  Function Level of Independence: Independent with assistive device(s)         Comments: [employer], drives     Hand Dominance   Dominant Hand: Right    Extremity/Trunk Assessment   Upper Extremity Assessment Upper Extremity Assessment: Generalized weakness    Lower Extremity Assessment Lower Extremity Assessment: Generalized weakness    Cervical / Trunk Assessment Cervical / Trunk Assessment: Normal  Communication   Communication: No difficulties  Cognition Arousal/Alertness: Awake/alert Behavior During Therapy: WFL for tasks assessed/performed Overall Cognitive Status: Within Functional Limits for tasks assessed                                        General Comments      Exercises     Assessment/Plan    PT Assessment Patient needs continued PT services  PT Problem List Decreased strength;Decreased activity tolerance;Decreased balance;Decreased mobility       PT Treatment Interventions DME instruction;Gait training;Stair training;Functional mobility training;Therapeutic activities;Therapeutic exercise;Patient/family education;Balance training    PT Goals (Current goals can be found in the Care Plan section)  Acute Rehab PT Goals Patient Stated Goal: get stronger before returning home due to limited help at home PT Goal Formulation: With patient Time For Goal Achievement: 10/12/20 Potential to Achieve Goals: Good    Frequency Min 3X/week   Barriers to discharge        Co-evaluation               AM-PAC PT "6 Clicks" Mobility  Outcome Measure Help needed turning from your back to your side while in a flat bed without using bedrails?: None Help needed moving from lying on your back to sitting on the side of a flat bed without using bedrails?: A Little Help needed moving to and from a bed to a chair (including a wheelchair)?: A Little Help needed standing up from a chair using your arms (e.g., wheelchair or  bedside chair)?: A Little Help needed to walk in hospital room?: A Little Help needed climbing 3-5 steps with a railing? : A Lot 6 Click Score: 18    End of Session   Activity Tolerance: Patient tolerated treatment well;Patient limited by fatigue Patient left: in bed;with family/visitor present;with call bell/phone within reach Nurse Communication: Mobility status PT Visit Diagnosis: Unsteadiness on feet (R26.81);Other abnormalities of gait and mobility (R26.89);Muscle weakness (generalized) (M62.81)    Time: 8413-2440 PT Time Calculation (min) (ACUTE ONLY): 20 min   Charges:   PT Evaluation $PT Eval Moderate Complexity: 1 Mod PT Treatments $Therapeutic Activity: 8-22 mins        2:17 PM, 09/28/20 Lonell Grandchild, MPT Physical Therapist with [facility] [phone] office [phone] mobile phone

## 2020-09-28 NOTE — Progress Notes (Signed)
Pharmacy Antibiotic Note  Renee [name] is a 80 y.o. female admitted on 09/24/2020 with sepsis.  Pharmacy has been consulted for cefepime and vancomycin dosing. Patient presents from home with complaints of abdominal pain s/p surgery at [facility] in early August.    Plan: Continue Cefepime 2G IV Q12H  Continue Vancomycin 1000 MG IV Q24H   Monitor labs, c/s, and vanco levels as indicated   Height: 5\' 3"  (160 cm) Weight: 73.9 kg (162 lb 14.7 oz) IBW/kg (Calculated) : 52.4  Temp (24hrs), Avg:98.3 F (36.8 C), Min:98 F (36.7 C), Max:98.9 F (37.2 C)  Recent Labs  Lab 09/24/20 2103 09/24/20 2138 09/24/20 2301 09/25/20 0310 09/25/20 0349 09/25/20 0405 09/26/20 0401 09/27/20 0511 09/28/20 0439  WBC 27.8*  --   --   --  27.3*  --  17.5* 10.3 7.9  CREATININE 0.62  --   --   --  0.54  --  0.40* 0.42* 0.45  LATICACIDVEN  --  2.1* 2.1* 1.4  --  1.8  --   --   --      Estimated Creatinine Clearance: 54 mL/min (by C-G formula based on SCr of 0.45 mg/dL).    Allergies  Allergen Reactions   Atorvastatin Other (See Comments)    Pt reports causes "muscle aches."   Codeine Itching    Mood changes, can take percocet   Ibuprofen Hives    Blisters    Lisinopril Cough   Aspirin Nausea Only    Patient able to take lower dose, Aspirin 81mg      Antimicrobials this admission: 8/26 Cefepime>> 8/26 Vancomycin>>   Microbiology results: 8/26 Bcx: ngtd 8/27 fluid Cx: staph aureus and pseudomonas 8/27 Ucx: 2,000 colonies/mL strep agalactiae MRSA PCR; neg   Thank you for allowing pharmacy to be a part of this patient's care.  Margot Ables, PharmD Clinical Pharmacist 09/28/2020 11:14 AM

## 2020-09-28 NOTE — Progress Notes (Signed)
Pharmacy Antibiotic Note  Renee [name] is a 80 y.o. female admitted on 09/24/2020 with sepsis.  Pharmacy has been consulted for cefepime and vancomycin dosing. Patient presents from home with complaints of abdominal pain s/p surgery at [facility] in early August.    8/30 PM update:  Vancomycin trough is low at 5 Drawn correctly   Plan: Inc vancomycin to 750 mg IV q12h Cont Cefepime at current dose Re-check vancomycin levels as needed  Height: 5\' 3"  (160 cm) Weight: 73.9 kg (162 lb 14.7 oz) IBW/kg (Calculated) : 52.4  Temp (24hrs), Avg:98 F (36.7 C), Min:97.9 F (36.6 C), Max:98 F (36.7 C)  Recent Labs  Lab 09/24/20 2103 09/24/20 2138 09/24/20 2301 09/25/20 0310 09/25/20 0349 09/25/20 0405 09/26/20 0401 09/27/20 0511 09/28/20 0439 09/28/20 2141  WBC 27.8*  --   --   --  27.3*  --  17.5* 10.3 7.9  --   CREATININE 0.62  --   --   --  0.54  --  0.40* 0.42* 0.45  --   LATICACIDVEN  --  2.1* 2.1* 1.4  --  1.8  --   --   --   --   VANCOTROUGH  --   --   --   --   --   --   --   --   --  5*     Estimated Creatinine Clearance: 54 mL/min (by C-G formula based on SCr of 0.45 mg/dL).    Allergies  Allergen Reactions   Atorvastatin Other (See Comments)    Pt reports causes "muscle aches."   Codeine Itching    Mood changes, can take percocet   Ibuprofen Hives    Blisters    Lisinopril Cough   Aspirin Nausea Only    Patient able to take lower dose, Aspirin 81mg      Narda Bonds, PharmD, BCPS Clinical Pharmacist Phone: [phone]

## 2020-09-28 NOTE — TOC Progression Note (Signed)
Transition of Care [facility]) - Progression Note    Patient Details  Name: AYRIANNA MCGINNISS MRN: [id number] Date of Birth: 26-Aug-1940  Transition of Care [facility]) CM/SW Contact  Salome Arnt, [place] Phone Number: 09/28/2020, 12:44 PM  Clinical Narrative:  Pt accepts bed offer at [facility]. Facility notified. CMA will update auth.      Expected Discharge Plan: [place] Barriers to Discharge: Continued Medical Work up  Expected Discharge Plan and Services Expected Discharge Plan: [place] In-house Referral: Clinical Social Work   Post Acute Care Choice: [place] Living arrangements for the past 2 months: Single Family Home                 DME Arranged: N/A                     Social Determinants of Health (SDOH) Interventions    Readmission Risk Interventions No flowsheet data found.

## 2020-09-28 NOTE — Progress Notes (Signed)
Patient Demographics:    Renee [name], is a 80 y.o. female, DOB - 12-Feb-1940, XBL:[id number]  Admit date - 09/24/2020   Admitting Physician Bernadette Hoit, DO  Outpatient Primary MD for the patient is Deland Pretty, MD  LOS - 3   Chief Complaint  Patient presents with   Abdominal Pain    S/p surgery        Subjective:    Renee [name] no fever, no chest pain, no nausea, no vomiting.  Reports feeling weak, tired and deconditioned.   Assessment  & Plan :    Principal Problem:   Sepsis ([place]) Active Problems:   GERD (gastroesophageal reflux disease)   UTI (urinary tract infection)   Thrombocytosis   Leukocytosis   Hypoalbuminemia   Hyponatremia   Hyperglycemia   Lactic acidosis   Prolonged QT interval   Malnutrition of moderate degree   Brief Summary:- 80 y.o. female with medical history significant for GERD, COPD, type II DM-admitted on 09/25/2020 with sepsis secondary to urinary source and possible pneumonia   A/p   1)Sepsis possible secondary to  pneumonia/intra-abdominal wound infection--- POA Patient met sepsis criteria due to leukocytosis, fever, tachycardia, tachypnea.  and finding of pneumonia  --Gram stain from patient's JP drain with positive Pseudomonas and staph RS. -Continue IV cefepime and vancomycin pending tolerance of oral intake. -WBC 27.8 >> 17.5>>10.3; patient is afebrile. -Urine culture with strep agalactiae and Lactobacillus; cover with current antibiotics. -Repeat chest x-ray continues to demonstrate pneumonia; suspected to be bacterial in origin; no requiring oxygen supplementation and not using accessory muscles.   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  3)DM2-A1c 5.7 reflecting excellent diabetic control -No diabetic meds just diet control -Will continue to follow CBGs.  4)HypoNatremia/hypokalemia --- Na 130 >> 133>>135>>139 --Sodium and potassium has normalized -Continue IV fluid and encourage oral intake.   5)  GERD -Continue Protonix   6) Hypoalbuminemia secondary to moderate protein calorie malnutrition Albumin 3.0; protein supplement encouraged   7) chronic anemia--- hemoglobin is around 11 which is close to patient's prior baseline -No evidence of ongoing bleeding monitor closely and transfuse as clinically indicated  8) nausea and vomiting- -Improved and controlled currently -In the setting of acute pneumonia and abdominal infection. -Continue IV fluids and supportive care. -Continue treatment of infection.  9) generalized weakness and deconditioning -Patient has been seen by physical therapy with recommendation for a skilled nursing facility at discharge for rehab. -TOC has been made aware and assisting with placement.  Disposition/Need for in-Hospital Stay- patient unable to be discharged at this time due to sepsis secondary to intra-abdominal wound infection and pneumonia requiring IV antibiotics pending further culture data*  Status is: Inpatient  Remains inpatient appropriate because: Please see disposition above  Disposition: The patient is from: Home              Anticipated d/c is to: Home  Anticipated d/c date is: 1-2 days              Patient currently is not medically stable to d/c.  Barriers: Not Clinically Stable-   Code Status :  -  Code Status: Full Code   Family Communication:   (patient is alert, awake and coherent) Discussed with sister at bedside on 09/28/2020 Consults  :  na  DVT  Prophylaxis  :   - SCDs  enoxaparin (LOVENOX) injection 40 mg Start: 09/25/20 0600 SCDs Start: 09/25/20 0155    Lab Results  Component Value Date   PLT 457 (H) 09/28/2020    Inpatient Medications  Scheduled Meds:  (feeding supplement) PROSource Plus  30 mL Oral BID BM   Chlorhexidine Gluconate Cloth  6 each Topical Q0600   enoxaparin (LOVENOX) injection  40 mg Subcutaneous Q24H   feeding supplement  237 mL Oral BID BM   metoprolol tartrate  50 mg Oral BID   multivitamin-lutein  1 capsule Oral Daily   pantoprazole  40 mg Oral Daily   saccharomyces boulardii  250 mg Oral BID   Continuous Infusions:  sodium chloride 40 mL/hr at 09/27/20 2043   ceFEPime (MAXIPIME) IV 2 g (09/28/20 1146)   vancomycin 1,000 mg (09/28/20 0137)   PRN Meds:.acetaminophen, HYDROmorphone (DILAUDID) injection, ondansetron (ZOFRAN) IV, prochlorperazine    Anti-infectives (From admission, onward)    Start     Dose/Rate Route Frequency Ordered Stop   09/25/20 2200  vancomycin (VANCOCIN) IVPB 1000 mg/200 mL premix        1,000 mg 200 mL/hr over 60 Minutes Intravenous Every 24 hours 09/24/20 2142 10/01/20 2159   09/24/20 2200  ceFEPIme (MAXIPIME) 2 g in sodium chloride 0.9 % 100 mL IVPB        2 g 200 mL/hr over 30 Minutes Intravenous Every 12 hours 09/24/20 2136 10/01/20 2159   09/24/20 2145  ceFEPIme (MAXIPIME) 2 g in sodium chloride 0.9 % 100 mL IVPB  Status:  Discontinued        2 g 200 mL/hr over 30 Minutes Intravenous  Once 09/24/20 2130 09/24/20 2136   09/24/20 2145  metroNIDAZOLE (FLAGYL) IVPB 500 mg        500 mg 100 mL/hr over 60 Minutes Intravenous  Once 09/24/20 2130 09/24/20 2308   09/24/20 2145  vancomycin (VANCOCIN) IVPB 1000 mg/200 mL premix  Status:  Discontinued        1,000 mg 200 mL/hr over 60 Minutes Intravenous  Once 09/24/20 2130 09/24/20 2136   09/24/20 2145  vancomycin (VANCOREADY) IVPB 1500 mg/300 mL        1,500 mg 150 mL/hr over 120 Minutes Intravenous  Once 09/24/20 2136  09/25/20 0033         Objective:   Vitals:   09/28/20 0226 09/28/20 0227 09/28/20 0231 09/28/20 0536  BP: (!) 161/65     Pulse:  (!) 101    Resp:      Temp:   98 F (36.7 C) 98 F (36.7 C)  TempSrc:    Oral  SpO2:  93%    Weight:    73.9 kg  Height:        Wt Readings from Last 3 Encounters:  09/28/20 73.9 kg  08/30/20 74.6 kg  08/27/20 74.6 kg     Intake/Output Summary (Last 24 hours) at 09/28/2020 1856 Last data filed at 09/28/2020 1500 Gross per 24 hour  Intake 1869.39 ml  Output --  Net 1869.39 ml  Physical Exam General exam: Alert, awake, oriented x 3; afebrile, no chest pain, no nausea vomiting currently.  Feeling weak, tired and deconditioned. Respiratory system: Clear to auscultation. Respiratory effort normal.  No requiring oxygen supplementation.  No using accessory muscle. Cardiovascular system:RRR. No murmurs, rubs, gallops.  No JVD. Gastrointestinal system: Abdomen is nondistended, soft and no guarding appreciated.. No organomegaly or masses felt. Normal bowel sounds heard.  Left flank JP drain in place. Central nervous system: Alert and oriented. No focal neurological deficits. Extremities: No cyanosis or clubbing. Skin: No petechiae. Psychiatry: Judgement and insight appear normal. Mood & affect appropriate.     Data Review:   Micro Results Recent Results (from the past 240 hour(s))  Blood culture (routine x 2)     Status: None (Preliminary result)   Collection Time: 09/24/20  9:39 PM   Specimen: BLOOD LEFT ARM  Result Value Ref Range Status   Specimen Description BLOOD LEFT ARM  Final   Special Requests   Final    BOTTLES DRAWN AEROBIC AND ANAEROBIC Blood Culture adequate volume   Culture   Final    NO GROWTH 4 DAYS Performed at [facility], [address] [place], [place] [address]    Report Status PENDING  Incomplete  Blood culture (routine x 2)     Status: None (Preliminary result)   Collection Time: 09/24/20  9:47 PM   Specimen:  BLOOD LEFT HAND  Result Value Ref Range Status   Specimen Description BLOOD LEFT HAND  Final   Special Requests   Final    BOTTLES DRAWN AEROBIC AND ANAEROBIC Blood Culture adequate volume   Culture   Final    NO GROWTH 4 DAYS Performed at [facility], [address] [place], [place] [address]    Report Status PENDING  Incomplete  Urine Culture     Status: Abnormal   Collection Time: 09/25/20 12:05 AM   Specimen: In/Out Cath Urine  Result Value Ref Range Status   Specimen Description   Final    IN/OUT CATH URINE Performed at [facility], [address], [place], [place] [address]    Special Requests   Final    NONE Performed at [facility], [address], [place], [place] [address]    Culture (A)  Final    2,000 COLONIES/mL STREPTOCOCCUS AGALACTIAE TESTING AGAINST S. AGALACTIAE NOT ROUTINELY PERFORMED DUE TO PREDICTABILITY OF AMP/PEN/VAN SUSCEPTIBILITY. 20,000 COLONIES/mL LACTOBACILLUS SPECIES Standardized susceptibility testing for this organism is not available. Performed at [facility], [place] [address], [place],  [address]    Report Status 09/26/2020 FINAL  Final  Resp Panel by RT-PCR (Flu A&B, Covid) Nasopharyngeal Swab     Status: None   Collection Time: 09/25/20  4:45 AM   Specimen: Nasopharyngeal Swab; Nasopharyngeal(NP) swabs in vial transport medium  Result Value Ref Range Status   SARS Coronavirus 2 by RT PCR NEGATIVE NEGATIVE Final    Comment: (NOTE) SARS-CoV-2 target nucleic acids are NOT DETECTED.  The SARS-CoV-2 RNA is generally detectable in upper respiratory specimens during the acute phase of infection. The lowest concentration of SARS-CoV-2 viral copies this assay can detect is 138 copies/mL. A negative result does not preclude SARS-Cov-2 infection and should not be used as the sole basis for treatment or other patient management decisions. A negative result may occur with  improper specimen collection/handling, submission of specimen  other than nasopharyngeal swab, presence of viral mutation(s) within the areas targeted by this assay, and inadequate number of viral  copies(<138 copies/mL). A negative result must be combined with clinical observations, patient history, and epidemiological information. The expected result is Negative.  Fact Sheet for Patients:  EntrepreneurPulse.com.au  Fact Sheet for Healthcare Providers:  IncredibleEmployment.be  This test is no t yet approved or cleared by the [place] FDA and  has been authorized for detection and/or diagnosis of SARS-CoV-2 by FDA under an Emergency Use Authorization (EUA). This EUA will remain  in effect (meaning this test can be used) for the duration of the COVID-19 declaration under Section 564(b)(1) of the Act, 21 U.S.C.section 360bbb-3(b)(1), unless the authorization is terminated  or revoked sooner.       Influenza A by PCR NEGATIVE NEGATIVE Final   Influenza B by PCR NEGATIVE NEGATIVE Final    Comment: (NOTE) The Xpert Xpress SARS-CoV-2/FLU/RSV plus assay is intended as an aid in the diagnosis of influenza from Nasopharyngeal swab specimens and should not be used as a sole basis for treatment. Nasal washings and aspirates are unacceptable for Xpert Xpress SARS-CoV-2/FLU/RSV testing.  Fact Sheet for Patients: EntrepreneurPulse.com.au  Fact Sheet for Healthcare Providers: IncredibleEmployment.be  This test is not yet approved or cleared by the [place] FDA and has been authorized for detection and/or diagnosis of SARS-CoV-2 by FDA under an Emergency Use Authorization (EUA). This EUA will remain in effect (meaning this test can be used) for the duration of the COVID-19 declaration under Section 564(b)(1) of the Act, 21 U.S.C. section 360bbb-3(b)(1), unless the authorization is terminated or revoked.  Performed at [facility] : [facility], [address], [place], [place]  [address]   MRSA Next Gen by PCR, Nasal     Status: None   Collection Time: 09/25/20  7:45 AM   Specimen: Nasal Mucosa; Nasal Swab  Result Value Ref Range Status   MRSA by PCR Next Gen NOT DETECTED NOT DETECTED Final    Comment: (NOTE) The GeneXpert MRSA Assay (FDA approved for NASAL specimens only), is one component of a comprehensive MRSA colonization surveillance program. It is not intended to diagnose MRSA infection nor to guide or monitor treatment for MRSA infections. Test performance is not FDA approved in patients less than 70 years old. Performed at [facility], [address] [place], [place] [address]   Body fluid culture w Gram Stain     Status: None (Preliminary result)   Collection Time: 09/25/20 10:02 AM   Specimen: JP Drain; Body Fluid  Result Value Ref Range Status   Specimen Description   Final    JP DRAINAGE Performed at [facility], [address], [place], [place] [address]    Special Requests   Final    Immunocompromised Performed at [facility], [address], [place], [place] [address]    Gram Stain   Final    FEW SQUAMOUS EPITHELIAL CELLS PRESENT MODERATE WBC PRESENT,BOTH PMN AND MONONUCLEAR MODERATE GRAM POSITIVE COCCI MODERATE GRAM NEGATIVE RODS    Culture   Final    ABUNDANT STAPHYLOCOCCUS AUREUS ABUNDANT PSEUDOMONAS [place] TO FOLLOW Performed at [facility], [place] [address] [place], [place] [address]    Report Status PENDING  Incomplete   Organism ID, Bacteria PSEUDOMONAS [place]  Final      Susceptibility   Pseudomonas mendocina - MIC*    CEFTAZIDIME 8 SENSITIVE Sensitive     CIPROFLOXACIN <=0.25 SENSITIVE Sensitive     GENTAMICIN <=1 SENSITIVE Sensitive     IMIPENEM 2 SENSITIVE Sensitive     * ABUNDANT PSEUDOMONAS [place]    Radiology  Reports DG Chest 2 View  Result Date: 09/27/2020 CLINICAL DATA:  Weakness.  Intermittent shortness of breath. EXAM: CHEST - 2 VIEW COMPARISON:  One-view chest x-ray  09/24/2020 FINDINGS: Heart size normal. Left pleural effusion and lower lobe airspace disease remains. Minimal airspace opacity at the right base has increased slightly. Small right effusion not excluded. Mild pulmonary vascular congestion remains. IMPRESSION: 1. Persistent left pleural effusion and lower lobe airspace disease. 2. Minimal increased airspace opacity at the right base. Electronically Signed   By: San Morelle M.D.   On: 09/27/2020 08:20   DG Chest 2 View  Result Date: 09/24/2020 CLINICAL DATA:  Cough EXAM: CHEST - 2 VIEW COMPARISON:  02/09/2012 FINDINGS: Small moderate left pleural effusion. Airspace disease at left lung base. Normal cardiac size. Aortic atherosclerosis. No pneumothorax. IMPRESSION: Small moderate left pleural effusion with left basilar atelectasis or pneumonia. Electronically Signed   By: Donavan Foil M.D.   On: 09/24/2020 21:37   CT Angio Chest PE W and/or Wo Contrast  Result Date: 09/24/2020 CLINICAL DATA:  Abdominal pain and fever. Pancreatectomy on 08/30/2020 as well as laparoscopic splenectomy and liver ultrasound. Normal lipase levels. EXAM: CT ANGIOGRAPHY CHEST CT ABDOMEN AND PELVIS WITH CONTRAST TECHNIQUE: Multidetector CT imaging of the chest was performed using the standard protocol during bolus administration of intravenous contrast. Multiplanar CT image reconstructions and MIPs were obtained to evaluate the vascular anatomy. Multidetector CT imaging of the abdomen and pelvis was performed using the standard protocol during bolus administration of intravenous contrast. CONTRAST:  50mL OMNIPAQUE IOHEXOL 350 MG/ML SOLN COMPARISON:  MR abdomen 08/04/2020, CT abdomen pelvis 10/20/2019 FINDINGS: CTA CHEST FINDINGS Cardiovascular: Satisfactory opacification of the pulmonary arteries to the segmental level. No evidence of pulmonary embolism. The main pulmonary artery is normal in caliber. Normal heart size. No significant pericardial effusion. The thoracic aorta  is normal in caliber. Moderate to severe calcified and noncalcified atherosclerotic plaque of the thoracic aorta. Coronary artery calcifications. Mediastinum/Nodes: No enlarged mediastinal, hilar, or axillary lymph nodes. Thyroid gland, trachea, and esophagus demonstrate no significant findings. Lungs/Pleura: Passive atelectasis of the left lower lobe. No focal consolidation. No pulmonary nodule or mass. No pneumothorax. At least small volume left pleural effusion. No associated pleural thickening or enhancement. No right pleural effusion. Musculoskeletal: No chest wall abnormality. No suspicious lytic or blastic osseous lesions. No acute displaced fracture. Multilevel degenerative changes of the spine. Review of the MIP images confirms the above findings. CT ABDOMEN and PELVIS FINDINGS Hepatobiliary: No focal liver abnormality. No gallstones, gallbladder wall thickening, or pericholecystic fluid. No biliary dilatation. Pancreas: Status post distal pancreatectomy. Associated fat stranding and free fluid within the left upper abdomen. No main pancreatic duct dilatation. Loss of fat plane between pancreatic anastomosis and gastric wall with associated gastric wall thickening (3:25.) Spleen: Status post splenectomy. Adrenals/Urinary Tract: No adrenal nodule bilaterally. Asymmetric left perinephric fat stranding. Bilateral kidneys enhance symmetrically. There is urothelial thickening of the left renal pelvis and ureter. Redemonstration of a 1 cm exophytic left renal lesion with a density of 73 Hounsfield units (6:65, 3:22). No hydronephrosis. No hydroureter. The urinary bladder is unremarkable. Stomach/Bowel: Posterior gastric wall thickening and edema (3: 23-26). No stomach dilatation. Otherwise no evidence of bowel wall thickening or dilatation. Scattered colonic diverticulosis. The appendix not definitely identified. Vascular/Lymphatic: No abdominal aorta or iliac aneurysm. At least moderate atherosclerotic plaque of  the aorta and its branches. No abdominal, pelvic, or inguinal lymphadenopathy. Reproductive: Status post hysterectomy. No adnexal masses. Other: Left upper abdomen  surgical drain with tip terminating inferior to the expected pancreatic tail region. Trace left upper abdominal and pelvic free fluid. No intraperitoneal free gas. No organized fluid collection. Musculoskeletal: No abdominal wall hernia or abnormality. Healing anterior abdominal incision. No suspicious lytic or blastic osseous lesions. No acute displaced fracture. Multilevel degenerative changes of the spine. Review of the MIP images confirms the above findings. IMPRESSION: 1. No central or segmental pulmonary embolus. 2. At least small volume left pleural effusion. 3. Status post distal pancreatectomy with associated fat stranding and trace free fluid within the surgical bed. Adhesion of the pancreatic anastomosis and gastric wall i is identified with associated gastric wall thickening/edema. Underlying fistula is not excluded. No organized fluid collection. 4. Asymmetric left perinephric fat stranding with associated urothelial thickening. Correlate with urinalysis for infection. 5. Indeterminate 1 cm left exophytic renal lesion. Recommend nonemergent MRI renal protocol for further evaluation. 6. Scattered colonic diverticulosis with no acute diverticulitis. 7.  Aortic Atherosclerosis (ICD10-I70.0). Electronically Signed   By: Iven Finn M.D.   On: 09/24/2020 23:26   CT ABDOMEN PELVIS W CONTRAST  Result Date: 09/24/2020 CLINICAL DATA:  Abdominal pain and fever. Pancreatectomy on 08/30/2020 as well as laparoscopic splenectomy and liver ultrasound. Normal lipase levels. EXAM: CT ANGIOGRAPHY CHEST CT ABDOMEN AND PELVIS WITH CONTRAST TECHNIQUE: Multidetector CT imaging of the chest was performed using the standard protocol during bolus administration of intravenous contrast. Multiplanar CT image reconstructions and MIPs were obtained to evaluate  the vascular anatomy. Multidetector CT imaging of the abdomen and pelvis was performed using the standard protocol during bolus administration of intravenous contrast. CONTRAST:  71mL OMNIPAQUE IOHEXOL 350 MG/ML SOLN COMPARISON:  MR abdomen 08/04/2020, CT abdomen pelvis 10/20/2019 FINDINGS: CTA CHEST FINDINGS Cardiovascular: Satisfactory opacification of the pulmonary arteries to the segmental level. No evidence of pulmonary embolism. The main pulmonary artery is normal in caliber. Normal heart size. No significant pericardial effusion. The thoracic aorta is normal in caliber. Moderate to severe calcified and noncalcified atherosclerotic plaque of the thoracic aorta. Coronary artery calcifications. Mediastinum/Nodes: No enlarged mediastinal, hilar, or axillary lymph nodes. Thyroid gland, trachea, and esophagus demonstrate no significant findings. Lungs/Pleura: Passive atelectasis of the left lower lobe. No focal consolidation. No pulmonary nodule or mass. No pneumothorax. At least small volume left pleural effusion. No associated pleural thickening or enhancement. No right pleural effusion. Musculoskeletal: No chest wall abnormality. No suspicious lytic or blastic osseous lesions. No acute displaced fracture. Multilevel degenerative changes of the spine. Review of the MIP images confirms the above findings. CT ABDOMEN and PELVIS FINDINGS Hepatobiliary: No focal liver abnormality. No gallstones, gallbladder wall thickening, or pericholecystic fluid. No biliary dilatation. Pancreas: Status post distal pancreatectomy. Associated fat stranding and free fluid within the left upper abdomen. No main pancreatic duct dilatation. Loss of fat plane between pancreatic anastomosis and gastric wall with associated gastric wall thickening (3:25.) Spleen: Status post splenectomy. Adrenals/Urinary Tract: No adrenal nodule bilaterally. Asymmetric left perinephric fat stranding. Bilateral kidneys enhance symmetrically. There is  urothelial thickening of the left renal pelvis and ureter. Redemonstration of a 1 cm exophytic left renal lesion with a density of 73 Hounsfield units (6:65, 3:22). No hydronephrosis. No hydroureter. The urinary bladder is unremarkable. Stomach/Bowel: Posterior gastric wall thickening and edema (3: 23-26). No stomach dilatation. Otherwise no evidence of bowel wall thickening or dilatation. Scattered colonic diverticulosis. The appendix not definitely identified. Vascular/Lymphatic: No abdominal aorta or iliac aneurysm. At least moderate atherosclerotic plaque of the aorta and its branches. No abdominal, pelvic,  or inguinal lymphadenopathy. Reproductive: Status post hysterectomy. No adnexal masses. Other: Left upper abdomen surgical drain with tip terminating inferior to the expected pancreatic tail region. Trace left upper abdominal and pelvic free fluid. No intraperitoneal free gas. No organized fluid collection. Musculoskeletal: No abdominal wall hernia or abnormality. Healing anterior abdominal incision. No suspicious lytic or blastic osseous lesions. No acute displaced fracture. Multilevel degenerative changes of the spine. Review of the MIP images confirms the above findings. IMPRESSION: 1. No central or segmental pulmonary embolus. 2. At least small volume left pleural effusion. 3. Status post distal pancreatectomy with associated fat stranding and trace free fluid within the surgical bed. Adhesion of the pancreatic anastomosis and gastric wall i is identified with associated gastric wall thickening/edema. Underlying fistula is not excluded. No organized fluid collection. 4. Asymmetric left perinephric fat stranding with associated urothelial thickening. Correlate with urinalysis for infection. 5. Indeterminate 1 cm left exophytic renal lesion. Recommend nonemergent MRI renal protocol for further evaluation. 6. Scattered colonic diverticulosis with no acute diverticulitis. 7.  Aortic Atherosclerosis  (ICD10-I70.0). Electronically Signed   By: Iven Finn M.D.   On: 09/24/2020 23:26   DG ABD ACUTE 2+V W 1V CHEST  Result Date: 09/27/2020 CLINICAL DATA:  Sepsis and pneumonia status post splenectomy and partial pancreatectomy. EXAM: DG ABDOMEN ACUTE WITH 1 VIEW CHEST COMPARISON:  Chest radiograph dated 09/27/2020. FINDINGS: Small left pleural effusion and left lung base atelectasis or infiltrate. No pneumothorax. The cardiac silhouette is within limits. No bowel dilatation or evidence of obstruction. No free air. No radiopaque calculi. A drainage catheter noted in the left hemiabdomen with tip over the left lower abdomen. Osteopenia with degenerative changes of the and hips. No acute osseous pathology. IMPRESSION: 1. Small left pleural effusion and left lung base atelectasis or infiltrate. 2. No evidence of bowel obstruction. 3. Drainage catheter with tip over the left lower abdomen. Electronically Signed   By: Anner Crete M.D.   On: 09/27/2020 21:04     CBC Recent Labs  Lab 09/24/20 2103 09/25/20 0349 09/26/20 0401 09/27/20 0511 09/28/20 0439  WBC 27.8* 27.3* 17.5* 10.3 7.9  HGB 12.9 12.1 10.9* 11.4* 11.8*  HCT 39.6 38.2 35.0* 36.0 37.0  PLT 509* 432* 367 412* 457*  MCV 100.8* 102.4* 102.0* 102.3* 100.8*  MCH 32.8 32.4 31.8 32.4 32.2  MCHC 32.6 31.7 31.1 31.7 31.9  RDW 11.9 11.8 12.0 12.1 11.9  LYMPHSABS 0.7  --   --   --   --   MONOABS 2.7*  --   --   --   --   EOSABS 0.0  --   --   --   --   BASOSABS 0.1  --   --   --   --     Chemistries  Recent Labs  Lab 09/24/20 2103 09/25/20 0349 09/26/20 0401 09/27/20 0511 09/28/20 0439  NA 130* 133* 135 139 137  K 3.7 3.5 3.2* 4.7 4.0  CL 96* 98 100 104 100  CO2 $Re'24 27 26 29 30  'FxX$ GLUCOSE 205* 164* 135* 138* 156*  BUN 9 10 7* <5* <5*  CREATININE 0.62 0.54 0.40* 0.42* 0.45  CALCIUM 7.8* 7.9* 8.0* 8.3* 8.5*  MG  --  1.8 1.9  --   --   AST 23 16 12*  --   --   ALT $Re'12 12 8  'vWY$ --   --   ALKPHOS 91 81 81  --   --   BILITOT 0.6  0.5 0.4  --   --  Lab Results  Component Value Date   HGBA1C 5.7 (H) 08/27/2020    Coagulation profile Recent Labs  Lab 09/25/20 0349  INR 1.2     Barton Dubois M.D on 09/28/2020 at 6:56 PM  Go to [email or web address] - for contact info  [facility] - Office  [phone]

## 2020-09-29 DIAGNOSIS — B999 Unspecified infectious disease: Secondary | ICD-10-CM

## 2020-09-29 DIAGNOSIS — K219 Gastro-esophageal reflux disease without esophagitis: Secondary | ICD-10-CM | POA: Diagnosis not present

## 2020-09-29 DIAGNOSIS — E8809 Other disorders of plasma-protein metabolism, not elsewhere classified: Secondary | ICD-10-CM | POA: Diagnosis not present

## 2020-09-29 DIAGNOSIS — D649 Anemia, unspecified: Secondary | ICD-10-CM | POA: Diagnosis not present

## 2020-09-29 DIAGNOSIS — E871 Hypo-osmolality and hyponatremia: Secondary | ICD-10-CM | POA: Diagnosis not present

## 2020-09-29 DIAGNOSIS — E440 Moderate protein-calorie malnutrition: Secondary | ICD-10-CM

## 2020-09-29 DIAGNOSIS — R1115 Cyclical vomiting syndrome unrelated to migraine: Secondary | ICD-10-CM

## 2020-09-29 DIAGNOSIS — A4189 Other specified sepsis: Secondary | ICD-10-CM | POA: Diagnosis not present

## 2020-09-29 DIAGNOSIS — Z9081 Acquired absence of spleen: Secondary | ICD-10-CM | POA: Diagnosis not present

## 2020-09-29 DIAGNOSIS — E1165 Type 2 diabetes mellitus with hyperglycemia: Secondary | ICD-10-CM | POA: Diagnosis not present

## 2020-09-29 DIAGNOSIS — Z934 Other artificial openings of gastrointestinal tract status: Secondary | ICD-10-CM | POA: Diagnosis not present

## 2020-09-29 DIAGNOSIS — J449 Chronic obstructive pulmonary disease, unspecified: Secondary | ICD-10-CM | POA: Diagnosis not present

## 2020-09-29 DIAGNOSIS — N390 Urinary tract infection, site not specified: Secondary | ICD-10-CM | POA: Diagnosis not present

## 2020-09-29 DIAGNOSIS — A419 Sepsis, unspecified organism: Secondary | ICD-10-CM | POA: Diagnosis not present

## 2020-09-29 DIAGNOSIS — I10 Essential (primary) hypertension: Secondary | ICD-10-CM | POA: Diagnosis not present

## 2020-09-29 DIAGNOSIS — R0689 Other abnormalities of breathing: Secondary | ICD-10-CM | POA: Diagnosis not present

## 2020-09-29 DIAGNOSIS — M6281 Muscle weakness (generalized): Secondary | ICD-10-CM | POA: Diagnosis not present

## 2020-09-29 DIAGNOSIS — R2689 Other abnormalities of gait and mobility: Secondary | ICD-10-CM | POA: Diagnosis not present

## 2020-09-29 DIAGNOSIS — E7849 Other hyperlipidemia: Secondary | ICD-10-CM | POA: Diagnosis not present

## 2020-09-29 DIAGNOSIS — E872 Acidosis: Secondary | ICD-10-CM | POA: Diagnosis not present

## 2020-09-29 LAB — RESP PANEL BY RT-PCR (FLU A&B, COVID) ARPGX2
Influenza A by PCR: NEGATIVE
Influenza B by PCR: NEGATIVE
SARS Coronavirus 2 by RT PCR: NEGATIVE

## 2020-09-29 LAB — BODY FLUID CULTURE W GRAM STAIN

## 2020-09-29 LAB — CREATININE, SERUM
Creatinine, Ser: 0.46 mg/dL (ref 0.44–1.00)
GFR, Estimated: >60 mL/min (ref >60)

## 2020-09-29 LAB — CULTURE, BLOOD (ROUTINE X 2)
Culture: NO GROWTH
Culture: NO GROWTH
Special Requests: ADEQUATE
Special Requests: ADEQUATE

## 2020-09-29 MED ORDER — HYDROCODONE-ACETAMINOPHEN 5-325 MG PO TABS: 1.0000 | ORAL_TABLET | Freq: Four times a day (QID) | ORAL | 0 refills | Status: DC | PRN

## 2020-09-29 MED ORDER — CIPROFLOXACIN HCL 500 MG PO TABS: 500.0000 mg | ORAL_TABLET | Freq: Two times a day (BID) | ORAL | Status: DC

## 2020-09-29 MED ORDER — POLYETHYLENE GLYCOL 3350 17 G PO PACK: 17.0000 g | PACK | Freq: Every day | ORAL | Status: DC | PRN

## 2020-09-29 MED ORDER — DOCUSATE SODIUM 100 MG PO CAPS: 100.0000 mg | ORAL_CAPSULE | Freq: Two times a day (BID) | ORAL | Status: DC | PRN

## 2020-09-29 MED ORDER — SACCHAROMYCES BOULARDII 250 MG PO CAPS: 250.0000 mg | ORAL_CAPSULE | Freq: Two times a day (BID) | ORAL | Status: DC

## 2020-09-29 MED ORDER — ENSURE ENLIVE PO LIQD: 237.0000 mL | Freq: Two times a day (BID) | ORAL | Status: DC

## 2020-09-29 MED ORDER — CIPROFLOXACIN HCL 250 MG PO TABS: 500.0000 mg | ORAL_TABLET | Freq: Two times a day (BID) | ORAL | Status: DC

## 2020-09-29 MED ORDER — CHLORHEXIDINE GLUCONATE CLOTH 2 % EX PADS
6.0000 | MEDICATED_PAD | Freq: Every day | CUTANEOUS | Status: DC
  Administered 2020-09-29: 6 via TOPICAL

## 2020-09-29 NOTE — Plan of Care (Signed)

## 2020-09-29 NOTE — Progress Notes (Addendum)
Patient alert and oriented x4. No complaints of pain, shortness of breath, chest pain, dizziness, nausea or vomiting. Patient up out of bed to chair and bedside commode with supervision. Gait steady. Patient tolerated PO meds and diet well. Appetite fair. Patient states she has no abdominal pain/discomfort at this time. JP drain patent and charged and dressing clean, dry and intact. IV's removed without complications. Went over discharge summary/instructions with patient. All questions answered and patient expressed full understanding of instructions/summary. Called and gave report to nurse at [facility] and aware of JP drain. Patient given her own copy of discharge summary and another discharge summary along with signed prescription for vicodin put in discharge folder for facility. Patient expressed full understanding to give folder to facility. Patient discharged to [facility] rehab via car and family driving patient there.

## 2020-09-29 NOTE — Discharge Summary (Signed)
Physician Discharge Summary  Renee [name] BCW:[id number] DOB: Sep 03, 1940 DOA: 09/24/2020  PCP: Deland Pretty, MD  Admit date: 09/24/2020 Discharge date: 09/29/2020  Time spent: 35 minutes  Recommendations for Outpatient Follow-up:  Repeat CBC to follow hemoglobin trend WBCs and stability. Outpatient follow-up with general surgery as previously instructed Repeat basic metabolic panel and magnesium level to follow electrolytes and renal function trend. Close monitoring of patient's CBGs/A1c with further decisions about need for hypoglycemic regimen in the future (currently stable and not requiring meds)  Discharge Diagnoses:  Principal Problem:   Sepsis ([place]) Active Problems:   GERD (gastroesophageal reflux disease)   UTI (urinary tract infection)   Thrombocytosis   Leukocytosis   Hypoalbuminemia   Hyponatremia   Hyperglycemia   Lactic acidosis   Prolonged QT interval   Malnutrition of moderate degree   Emesis, persistent   Intra-abdominal infection   Discharge Condition: Stable and improved.  Discharge to skilled nursing facility for further care and rehabilitation.  CODE STATUS: Full code.  Diet recommendation: Heart healthy modified carbohydrate diet.  Filed Weights   09/24/20 2023 09/25/20 0746 09/28/20 0536  Weight: 74.6 kg 74.2 kg 73.9 kg    History of present illness:  As per H&P written by Dr. Josephine Cables on 09/25/2020 Renee [name] is a 80 y.o. female with medical history significant for GERD, COPD, type II DM who presents to the emergency department due to about 1 week onset of abdominal pain and left-sided back pain.  Patient states that she had a spleen and partial pancreatic removal on 09/10/2020, she recently had a postop follow-up with her surgeon and she has a forthcoming follow-up with the surgeon's office, she complained of abdominal pain which was sharp in nature and left flank pain which was of moderate intensity.  This was associated with some shortness  of breath especially when she takes deep breath.  She endorsed subjective fever at home, but denies chest pain, constipation, diarrhea, painful urination or urinary frequency.   ED Course:  In the emergency department, she was febrile, tachypneic, tachycardic, BP was 142/60.  In the ED showed leukocytosis, thrombocytosis, lactic acid x2-2.1.  BMP shows hyponatremia and hyperglycemia.  Albumin 3.0.  Troponin x2 was flat at 8, lipase 41.  Urinalysis was positive for > 50 WBC and small leukocytes  CT angiography of chest showed no central or segmental pulmonary embolus CT abdomen and pelvis with contrast showed status post distal pancreatectomy with associated fat stranding and trace free fluid within the surgical bed. Chest x-ray showed Small moderate left pleural effusion with left basilar atelectasis or pneumonia. Patient was treated with Tylenol, morphine, Zofran, empiric IV antibiotics (cefepime and vancomycin) was started. General surgeon (Dr. Bobbye Morton) of [facility] surgery was consulted and they spoke with patient's surgeon and it was repeated that findings of CT was expected.  It was recommended for patient to be admitted here at any pain and treated for UTI and that Dr. Michaelle Birks will be consulted if needed.  Hospitalist was asked to admit patient for further evaluation and management.  Hospital Course:   1)Sepsis possible secondary to  pneumonia/intra-abdominal wound infection--- POA Patient met sepsis criteria due to leukocytosis, fever, tachycardia, tachypnea.  and finding of pneumonia  --Gram stain from patient's JP drain with positive Pseudomonas and staph MSSA.  -WBCs within normal limits at time of discharge; patient is afebrile and complete resolution of sepsis achieved. -Urine culture with strep agalactiae and Lactobacillus; covered/treated with IB antibiotics given while hospitalized. -Repeat  chest x-ray continues to demonstrate pneumonia; suspected to be bacterial in  origin; no requiring oxygen supplementation and not using accessory muscles. -Following positive JP drain patient will receive 7 days of Cipro at time of discharge for treatment of Pseudomonas and staph areas microorganism.     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 -Following final culture sensitivity will treat with Cipro for another 7 days at discharge.  3)DM2-A1c 5.7 reflecting excellent diabetic control -No diabetic meds prior to admission. -Continue diet management -Intermittent CBGs and A1c evaluation to determine the need for hypoglycemic agents initiation.  4)HypoNatremia/hypokalemia/hypomagnesemia -Na 130 >> 133>>135>>139 --Sodium, magnesium and potassium has normalized -Continue to maintain adequate hydration and oral nutrition. -Repeat basic metabolic panels and magnesium level in 1 week to reassess stability and trend.   5)  GERD -Continue Protonix   6) Hypoalbuminemia secondary to moderate protein calorie malnutrition -Albumin 2.4 -In the setting of acute illness and anorexia following abdominal surgery. -protein supplement encouraged   7) chronic anemia  -hemoglobin is around 11 which is close to patient's prior baseline -No evidence of ongoing bleeding  -Repeat CBC to assess hemoglobin trend and stability.   8) nausea and vomiting -Improved and controlled currently -In the setting of acute pneumonia and abdominal infection. -Continue maintaining adequate hydration and oral nutrition. -Continue treatment for infection as mentioned above.   9) generalized weakness and deconditioning -Patient has been seen by physical therapy with recommendation for a skilled nursing facility at  discharge for rehab. -TOC helping with placement; planning for patient to discharge to [facility] for skilled nursing facility for rehab and conditioning.  10) hyperlipidemia -Continue outpatient treatment with Repatha  Procedures: See below for x-ray reports.  Consultations: None  Discharge Exam: Vitals:   09/28/20 2351 09/29/20 0957  BP:  (!) 171/71  Pulse:  (!) 117  Resp:  20  Temp: 97.6 F (36.4 C)   SpO2:      General: Afebrile, no chest pain, reports very little nausea prior to breakfast this morning; no further episode of vomiting and otherwise feeling ready to pursuit rehabilitation journey. Cardiovascular: S1 and S2, no rubs, no gallops, no JVD. Respiratory: Clear to auscultation bilaterally; no requiring the use of oxygen supplementation.  No using accessory muscle. Abdomen: Soft, nontender, nondistended.  JP drain in place.  Positive bowel sounds bilaterally. Extremities: No cyanosis, no clubbing or edema.  Discharge Instructions   Discharge Instructions     Diet - low sodium heart healthy   Complete by: As directed    Discharge instructions   Complete by: As directed    Take medications as prescribed Maintain adequate hydration  Follow heart healthy/low-fat diet Arrange follow-up with PCP in 10 days after discharge from the skilled nursing facility Physical therapy and rehabilitation as per the skilled nursing facility protocol Repeat basic metabolic panel in 1 week to assess electrolytes and renal function trend. Maintain outpatient follow-up with general surgery as previously instructed for further decision on JP drain and postoperative care.   Discharge wound care:   Complete by: As directed    Provide daily periwound care and dressing changes (especially on her JP drain).      Allergies as of 09/29/2020       Reactions   Atorvastatin Other (See Comments)  Pt reports causes "muscle aches."   Codeine Itching   Mood changes, can take percocet    Ibuprofen Hives   Blisters    Lisinopril Cough   Aspirin Nausea Only   Patient able to take lower dose, Aspirin $RemoveBefor'81mg'[id number]$          Medication List     STOP taking these medications    cyclobenzaprine 10 MG tablet Commonly known as: FLEXERIL   traMADol 50 MG tablet Commonly known as: ULTRAM       TAKE these medications    acetaminophen 325 MG tablet Commonly known as: TYLENOL Take 2 tablets (650 mg total) by mouth every 6 (six) hours as needed for mild pain or headache.   aspirin EC 81 MG tablet Take 1 tablet (81 mg total) by mouth daily.   CALCIUM 1200 PO Take 1,200 mg by mouth daily.   ciprofloxacin 500 MG tablet Commonly known as: CIPRO Take 1 tablet (500 mg total) by mouth 2 (two) times daily.   docusate sodium 100 MG capsule Commonly known as: COLACE Take 1 capsule (100 mg total) by mouth 2 (two) times daily as needed for mild constipation. What changed:  when to take this reasons to take this   esomeprazole 40 MG capsule Commonly known as: NexIUM Take 1 capsule (40 mg total) by mouth daily.   feeding supplement Liqd Take 237 mLs by mouth 2 (two) times daily between meals.   HYDROcodone-acetaminophen 5-325 MG tablet Commonly known as: NORCO/VICODIN Take 1 tablet by mouth every 6 (six) hours as needed for severe pain. What changed: reasons to take this   metoCLOPramide 5 MG tablet Commonly known as: Reglan Take 1 tablet (5 mg total) by mouth every 8 (eight) hours as needed for nausea or vomiting.   metoprolol tartrate 50 MG tablet Commonly known as: LOPRESSOR TAKE 1 TABLET(50 MG) BY MOUTH TWICE DAILY What changed: See the new instructions.   multivitamin tablet Take 1 tablet by mouth daily.   polyethylene glycol 17 g packet Commonly known as: MIRALAX / GLYCOLAX Take 17 g by mouth daily as needed for mild constipation. What changed:  when to take this reasons to take this   Prolia 60 MG/ML Sosy injection Generic drug: denosumab Inject 60  mg into the skin every 6 (six) months.   Repatha 140 MG/ML Sosy Generic drug: Evolocumab Inject 140 mg into the skin every 14 (fourteen) days.   saccharomyces boulardii 250 MG capsule Commonly known as: FLORASTOR Take 1 capsule (250 mg total) by mouth 2 (two) times daily.   VITAMIN D PO Take 1,000 Units by mouth daily.               Discharge Care Instructions  (From admission, onward)           Start     Ordered   09/29/20 0000  Discharge wound care:       Comments: Provide daily periwound care and dressing changes (especially on her JP drain).   09/29/20 1318           Allergies  Allergen Reactions   Atorvastatin Other (See Comments)    Pt reports causes "muscle aches."   Codeine Itching    Mood changes, can take percocet   Ibuprofen Hives    Blisters    Lisinopril Cough   Aspirin Nausea Only    Patient able to take lower dose, Aspirin $RemoveBefor'81mg'[id number]$      Contact information for follow-up providers     Deland Pretty, MD.  Schedule an appointment as soon as possible for a visit in 10 day(s).   Specialty: Internal Medicine Why: after discharge from SNF. Contact information: [address] [place] [address] [phone]         Burnell Blanks, MD .   Specialty: Cardiology Contact information: [address]  [address] [phone]              Contact information for after-discharge care     [facility] .   Service: Skilled Nursing Contact information: [address] [place] [phone]                     The results of significant diagnostics from this hospitalization (including imaging, microbiology, ancillary and laboratory) are listed below for reference.    Significant Diagnostic Studies: DG Chest 2 View  Result Date: 09/27/2020 CLINICAL DATA:  Weakness.  Intermittent  shortness of breath. EXAM: CHEST - 2 VIEW COMPARISON:  One-view chest x-ray 09/24/2020 FINDINGS: Heart size normal. Left pleural effusion and lower lobe airspace disease remains. Minimal airspace opacity at the right base has increased slightly. Small right effusion not excluded. Mild pulmonary vascular congestion remains. IMPRESSION: 1. Persistent left pleural effusion and lower lobe airspace disease. 2. Minimal increased airspace opacity at the right base. Electronically Signed   By: San Morelle M.D.   On: 09/27/2020 08:20   DG Chest 2 View  Result Date: 09/24/2020 CLINICAL DATA:  Cough EXAM: CHEST - 2 VIEW COMPARISON:  02/09/2012 FINDINGS: Small moderate left pleural effusion. Airspace disease at left lung base. Normal cardiac size. Aortic atherosclerosis. No pneumothorax. IMPRESSION: Small moderate left pleural effusion with left basilar atelectasis or pneumonia. Electronically Signed   By: Donavan Foil M.D.   On: 09/24/2020 21:37   CT Angio Chest PE W and/or Wo Contrast  Result Date: 09/24/2020 CLINICAL DATA:  Abdominal pain and fever. Pancreatectomy on 08/30/2020 as well as laparoscopic splenectomy and liver ultrasound. Normal lipase levels. EXAM: CT ANGIOGRAPHY CHEST CT ABDOMEN AND PELVIS WITH CONTRAST TECHNIQUE: Multidetector CT imaging of the chest was performed using the standard protocol during bolus administration of intravenous contrast. Multiplanar CT image reconstructions and MIPs were obtained to evaluate the vascular anatomy. Multidetector CT imaging of the abdomen and pelvis was performed using the standard protocol during bolus administration of intravenous contrast. CONTRAST:  52mL OMNIPAQUE IOHEXOL 350 MG/ML SOLN COMPARISON:  MR abdomen 08/04/2020, CT abdomen pelvis 10/20/2019 FINDINGS: CTA CHEST FINDINGS Cardiovascular: Satisfactory opacification of the pulmonary arteries to the segmental level. No evidence of pulmonary embolism. The main pulmonary artery is normal in caliber.  Normal heart size. No significant pericardial effusion. The thoracic aorta is normal in caliber. Moderate to severe calcified and noncalcified atherosclerotic plaque of the thoracic aorta. Coronary artery calcifications. Mediastinum/Nodes: No enlarged mediastinal, hilar, or axillary lymph nodes. Thyroid gland, trachea, and esophagus demonstrate no significant findings. Lungs/Pleura: Passive atelectasis of the left lower lobe. No focal consolidation. No pulmonary nodule or mass. No pneumothorax. At least small volume left pleural effusion. No associated pleural thickening or enhancement. No right pleural effusion. Musculoskeletal: No chest wall abnormality. No suspicious lytic or blastic osseous lesions. No acute displaced fracture. Multilevel degenerative changes of the spine. Review of the MIP images confirms the above findings. CT ABDOMEN and PELVIS FINDINGS Hepatobiliary: No focal liver abnormality. No gallstones, gallbladder wall thickening, or pericholecystic fluid. No biliary  dilatation. Pancreas: Status post distal pancreatectomy. Associated fat stranding and free fluid within the left upper abdomen. No main pancreatic duct dilatation. Loss of fat plane between pancreatic anastomosis and gastric wall with associated gastric wall thickening (3:25.) Spleen: Status post splenectomy. Adrenals/Urinary Tract: No adrenal nodule bilaterally. Asymmetric left perinephric fat stranding. Bilateral kidneys enhance symmetrically. There is urothelial thickening of the left renal pelvis and ureter. Redemonstration of a 1 cm exophytic left renal lesion with a density of 73 Hounsfield units (6:65, 3:22). No hydronephrosis. No hydroureter. The urinary bladder is unremarkable. Stomach/Bowel: Posterior gastric wall thickening and edema (3: 23-26). No stomach dilatation. Otherwise no evidence of bowel wall thickening or dilatation. Scattered colonic diverticulosis. The appendix not definitely identified. Vascular/Lymphatic: No  abdominal aorta or iliac aneurysm. At least moderate atherosclerotic plaque of the aorta and its branches. No abdominal, pelvic, or inguinal lymphadenopathy. Reproductive: Status post hysterectomy. No adnexal masses. Other: Left upper abdomen surgical drain with tip terminating inferior to the expected pancreatic tail region. Trace left upper abdominal and pelvic free fluid. No intraperitoneal free gas. No organized fluid collection. Musculoskeletal: No abdominal wall hernia or abnormality. Healing anterior abdominal incision. No suspicious lytic or blastic osseous lesions. No acute displaced fracture. Multilevel degenerative changes of the spine. Review of the MIP images confirms the above findings. IMPRESSION: 1. No central or segmental pulmonary embolus. 2. At least small volume left pleural effusion. 3. Status post distal pancreatectomy with associated fat stranding and trace free fluid within the surgical bed. Adhesion of the pancreatic anastomosis and gastric wall i is identified with associated gastric wall thickening/edema. Underlying fistula is not excluded. No organized fluid collection. 4. Asymmetric left perinephric fat stranding with associated urothelial thickening. Correlate with urinalysis for infection. 5. Indeterminate 1 cm left exophytic renal lesion. Recommend nonemergent MRI renal protocol for further evaluation. 6. Scattered colonic diverticulosis with no acute diverticulitis. 7.  Aortic Atherosclerosis (ICD10-I70.0). Electronically Signed   By: Iven Finn M.D.   On: 09/24/2020 23:26   CT ABDOMEN PELVIS W CONTRAST  Result Date: 09/24/2020 CLINICAL DATA:  Abdominal pain and fever. Pancreatectomy on 08/30/2020 as well as laparoscopic splenectomy and liver ultrasound. Normal lipase levels. EXAM: CT ANGIOGRAPHY CHEST CT ABDOMEN AND PELVIS WITH CONTRAST TECHNIQUE: Multidetector CT imaging of the chest was performed using the standard protocol during bolus administration of intravenous  contrast. Multiplanar CT image reconstructions and MIPs were obtained to evaluate the vascular anatomy. Multidetector CT imaging of the abdomen and pelvis was performed using the standard protocol during bolus administration of intravenous contrast. CONTRAST:  90mL OMNIPAQUE IOHEXOL 350 MG/ML SOLN COMPARISON:  MR abdomen 08/04/2020, CT abdomen pelvis 10/20/2019 FINDINGS: CTA CHEST FINDINGS Cardiovascular: Satisfactory opacification of the pulmonary arteries to the segmental level. No evidence of pulmonary embolism. The main pulmonary artery is normal in caliber. Normal heart size. No significant pericardial effusion. The thoracic aorta is normal in caliber. Moderate to severe calcified and noncalcified atherosclerotic plaque of the thoracic aorta. Coronary artery calcifications. Mediastinum/Nodes: No enlarged mediastinal, hilar, or axillary lymph nodes. Thyroid gland, trachea, and esophagus demonstrate no significant findings. Lungs/Pleura: Passive atelectasis of the left lower lobe. No focal consolidation. No pulmonary nodule or mass. No pneumothorax. At least small volume left pleural effusion. No associated pleural thickening or enhancement. No right pleural effusion. Musculoskeletal: No chest wall abnormality. No suspicious lytic or blastic osseous lesions. No acute displaced fracture. Multilevel degenerative changes of the spine. Review of the MIP images confirms the above findings. CT ABDOMEN and PELVIS FINDINGS Hepatobiliary:  No focal liver abnormality. No gallstones, gallbladder wall thickening, or pericholecystic fluid. No biliary dilatation. Pancreas: Status post distal pancreatectomy. Associated fat stranding and free fluid within the left upper abdomen. No main pancreatic duct dilatation. Loss of fat plane between pancreatic anastomosis and gastric wall with associated gastric wall thickening (3:25.) Spleen: Status post splenectomy. Adrenals/Urinary Tract: No adrenal nodule bilaterally. Asymmetric left  perinephric fat stranding. Bilateral kidneys enhance symmetrically. There is urothelial thickening of the left renal pelvis and ureter. Redemonstration of a 1 cm exophytic left renal lesion with a density of 73 Hounsfield units (6:65, 3:22). No hydronephrosis. No hydroureter. The urinary bladder is unremarkable. Stomach/Bowel: Posterior gastric wall thickening and edema (3: 23-26). No stomach dilatation. Otherwise no evidence of bowel wall thickening or dilatation. Scattered colonic diverticulosis. The appendix not definitely identified. Vascular/Lymphatic: No abdominal aorta or iliac aneurysm. At least moderate atherosclerotic plaque of the aorta and its branches. No abdominal, pelvic, or inguinal lymphadenopathy. Reproductive: Status post hysterectomy. No adnexal masses. Other: Left upper abdomen surgical drain with tip terminating inferior to the expected pancreatic tail region. Trace left upper abdominal and pelvic free fluid. No intraperitoneal free gas. No organized fluid collection. Musculoskeletal: No abdominal wall hernia or abnormality. Healing anterior abdominal incision. No suspicious lytic or blastic osseous lesions. No acute displaced fracture. Multilevel degenerative changes of the spine. Review of the MIP images confirms the above findings. IMPRESSION: 1. No central or segmental pulmonary embolus. 2. At least small volume left pleural effusion. 3. Status post distal pancreatectomy with associated fat stranding and trace free fluid within the surgical bed. Adhesion of the pancreatic anastomosis and gastric wall i is identified with associated gastric wall thickening/edema. Underlying fistula is not excluded. No organized fluid collection. 4. Asymmetric left perinephric fat stranding with associated urothelial thickening. Correlate with urinalysis for infection. 5. Indeterminate 1 cm left exophytic renal lesion. Recommend nonemergent MRI renal protocol for further evaluation. 6. Scattered colonic  diverticulosis with no acute diverticulitis. 7.  Aortic Atherosclerosis (ICD10-I70.0). Electronically Signed   By: Iven Finn M.D.   On: 09/24/2020 23:26   DG ABD ACUTE 2+V W 1V CHEST  Result Date: 09/27/2020 CLINICAL DATA:  Sepsis and pneumonia status post splenectomy and partial pancreatectomy. EXAM: DG ABDOMEN ACUTE WITH 1 VIEW CHEST COMPARISON:  Chest radiograph dated 09/27/2020. FINDINGS: Small left pleural effusion and left lung base atelectasis or infiltrate. No pneumothorax. The cardiac silhouette is within limits. No bowel dilatation or evidence of obstruction. No free air. No radiopaque calculi. A drainage catheter noted in the left hemiabdomen with tip over the left lower abdomen. Osteopenia with degenerative changes of the and hips. No acute osseous pathology. IMPRESSION: 1. Small left pleural effusion and left lung base atelectasis or infiltrate. 2. No evidence of bowel obstruction. 3. Drainage catheter with tip over the left lower abdomen. Electronically Signed   By: Anner Crete M.D.   On: 09/27/2020 21:04    Microbiology: Recent Results (from the past 240 hour(s))  Blood culture (routine x 2)     Status: None   Collection Time: 09/24/20  9:39 PM   Specimen: BLOOD LEFT ARM  Result Value Ref Range Status   Specimen Description BLOOD LEFT ARM  Final   Special Requests   Final    BOTTLES DRAWN AEROBIC AND ANAEROBIC Blood Culture adequate volume   Culture   Final    NO GROWTH 5 DAYS Performed at [facility], [address], [place], [place] [address]    Report Status 09/29/2020 FINAL  Final  Blood culture (routine x 2)     Status: None   Collection Time: 09/24/20  9:47 PM   Specimen: BLOOD LEFT HAND  Result Value Ref Range Status   Specimen Description BLOOD LEFT HAND  Final   Special Requests   Final    BOTTLES DRAWN AEROBIC AND ANAEROBIC Blood Culture adequate volume   Culture   Final    NO GROWTH 5 DAYS Performed at [facility] [address] [place], [place]  [address]    Report Status 09/29/2020 FINAL  Final  Urine Culture     Status: Abnormal   Collection Time: 09/25/20 12:05 AM   Specimen: In/Out Cath Urine  Result Value Ref Range Status   Specimen Description   Final    IN/OUT CATH URINE Performed at [facility], [address], [place], [place] [address]    Special Requests   Final    NONE Performed at [facility], [address], [place], [place] [address]    Culture (A)  Final    2,000 COLONIES/mL STREPTOCOCCUS AGALACTIAE TESTING AGAINST S. AGALACTIAE NOT ROUTINELY PERFORMED DUE TO PREDICTABILITY OF AMP/PEN/VAN SUSCEPTIBILITY. 20,000 COLONIES/mL LACTOBACILLUS SPECIES Standardized susceptibility testing for this organism is not available. Performed at [facility], [place] [address], [place], [place] [address]    Report Status 09/26/2020 FINAL  Final  Resp Panel by RT-PCR (Flu A&B, Covid) Nasopharyngeal Swab     Status: None   Collection Time: 09/25/20  4:45 AM   Specimen: Nasopharyngeal Swab; Nasopharyngeal(NP) swabs in vial transport medium  Result Value Ref Range Status   SARS Coronavirus 2 by RT PCR NEGATIVE NEGATIVE Final    Comment: (NOTE) SARS-CoV-2 target nucleic acids are NOT DETECTED.  The SARS-CoV-2 RNA is generally detectable in upper respiratory specimens during the acute phase of infection. The lowest concentration of SARS-CoV-2 viral copies this assay can detect is 138 copies/mL. A negative result does not preclude SARS-Cov-2 infection and should not be used as the sole basis for treatment or other patient management decisions. A negative result may occur with  improper specimen collection/handling, submission of specimen other than nasopharyngeal swab, presence of viral mutation(s) within the areas targeted by this assay, and inadequate number of viral copies(<138 copies/mL). A negative result must be combined with clinical observations, patient history, and epidemiological information. The expected result is  Negative.  Fact Sheet for Patients:  EntrepreneurPulse.com.au  Fact Sheet for Healthcare Providers:  IncredibleEmployment.be  This test is no t yet approved or cleared by the [place] FDA and  has been authorized for detection and/or diagnosis of SARS-CoV-2 by FDA under an Emergency Use Authorization (EUA). This EUA will remain  in effect (meaning this test can be used) for the duration of the COVID-19 declaration under Section 564(b)(1) of the Act, 21 U.S.C.section 360bbb-3(b)(1), unless the authorization is terminated  or revoked sooner.       Influenza A by PCR NEGATIVE NEGATIVE Final   Influenza B by PCR NEGATIVE NEGATIVE Final    Comment: (NOTE) The Xpert Xpress SARS-CoV-2/FLU/RSV plus assay is intended as an aid in the diagnosis of influenza from Nasopharyngeal swab specimens and should not be used as a sole basis for treatment. Nasal washings and aspirates are unacceptable for Xpert Xpress SARS-CoV-2/FLU/RSV testing.  Fact Sheet for Patients: EntrepreneurPulse.com.au  Fact Sheet for Healthcare Providers: IncredibleEmployment.be  This test is not yet approved or cleared by the [place] FDA and has been authorized for detection and/or diagnosis of SARS-CoV-2 by  FDA under an Emergency Use Authorization (EUA). This EUA will remain in effect (meaning this test can be used) for the duration of the COVID-19 declaration under Section 564(b)(1) of the Act, 21 U.S.C. section 360bbb-3(b)(1), unless the authorization is terminated or revoked.  Performed at [facility], [address], [place], [place] [address]   MRSA Next Gen by PCR, Nasal     Status: None   Collection Time: 09/25/20  7:45 AM   Specimen: Nasal Mucosa; Nasal Swab  Result Value Ref Range Status   MRSA by PCR Next Gen NOT DETECTED NOT DETECTED Final    Comment: (NOTE) The GeneXpert MRSA Assay (FDA approved for NASAL specimens  only), is one component of a comprehensive MRSA colonization surveillance program. It is not intended to diagnose MRSA infection nor to guide or monitor treatment for MRSA infections. Test performance is not FDA approved in patients less than 52 years old. Performed at [facility], [address], [place], [place] [address]   Body fluid culture w Gram Stain     Status: None   Collection Time: 09/25/20 10:02 AM   Specimen: JP Drain; Body Fluid  Result Value Ref Range Status   Specimen Description   Final    JP DRAINAGE Performed at [facility], [address], [place], [place] [address]    Special Requests   Final    Immunocompromised Performed at [facility], [address], [place], [place] [address]    Gram Stain   Final    FEW SQUAMOUS EPITHELIAL CELLS PRESENT MODERATE WBC PRESENT,BOTH PMN AND MONONUCLEAR MODERATE GRAM POSITIVE COCCI MODERATE GRAM NEGATIVE RODS Performed at [facility], [place] [address] [place], [place] [address]    Culture   Final    ABUNDANT STAPHYLOCOCCUS AUREUS ABUNDANT PSEUDOMONAS MENDOCINA    Report Status 09/29/2020 FINAL  Final   Organism ID, Bacteria PSEUDOMONAS MENDOCINA  Final   Organism ID, Bacteria STAPHYLOCOCCUS AUREUS  Final      Susceptibility   Pseudomonas mendocina - MIC*    CEFTAZIDIME 8 SENSITIVE Sensitive     CIPROFLOXACIN <=0.25 SENSITIVE Sensitive     GENTAMICIN <=1 SENSITIVE Sensitive     IMIPENEM 2 SENSITIVE Sensitive     * ABUNDANT PSEUDOMONAS MENDOCINA   Staphylococcus aureus - MIC*    CIPROFLOXACIN <=0.5 SENSITIVE Sensitive     ERYTHROMYCIN <=0.25 SENSITIVE Sensitive     GENTAMICIN <=0.5 SENSITIVE Sensitive     OXACILLIN 0.5 SENSITIVE Sensitive     TETRACYCLINE <=1 SENSITIVE Sensitive     VANCOMYCIN 1 SENSITIVE Sensitive     TRIMETH/SULFA <=10 SENSITIVE Sensitive     CLINDAMYCIN <=0.25 SENSITIVE Sensitive     RIFAMPIN <=0.5 SENSITIVE Sensitive     Inducible Clindamycin NEGATIVE Sensitive     * ABUNDANT  STAPHYLOCOCCUS AUREUS  Resp Panel by RT-PCR (Flu A&B, Covid) Nasopharyngeal Swab     Status: None   Collection Time: 09/29/20 11:34 AM   Specimen: Nasopharyngeal Swab; Nasopharyngeal(NP) swabs in vial transport medium  Result Value Ref Range Status   SARS Coronavirus 2 by RT PCR NEGATIVE NEGATIVE Final    Comment: (NOTE) SARS-CoV-2 target nucleic acids are NOT DETECTED.  The SARS-CoV-2 RNA is generally detectable in upper respiratory specimens during the acute phase of infection. The lowest concentration of SARS-CoV-2 viral copies this assay can detect is 138 copies/mL. A negative result does not preclude SARS-Cov-2 infection and should not be used as the sole basis for treatment or other patient management decisions. A negative  result may occur with  improper specimen collection/handling, submission of specimen other than nasopharyngeal swab, presence of viral mutation(s) within the areas targeted by this assay, and inadequate number of viral copies(<138 copies/mL). A negative result must be combined with clinical observations, patient history, and epidemiological information. The expected result is Negative.  Fact Sheet for Patients:  EntrepreneurPulse.com.au  Fact Sheet for Healthcare Providers:  IncredibleEmployment.be  This test is no t yet approved or cleared by the [place] FDA and  has been authorized for detection and/or diagnosis of SARS-CoV-2 by FDA under an Emergency Use Authorization (EUA). This EUA will remain  in effect (meaning this test can be used) for the duration of the COVID-19 declaration under Section 564(b)(1) of the Act, 21 U.S.C.section 360bbb-3(b)(1), unless the authorization is terminated  or revoked sooner.       Influenza A by PCR NEGATIVE NEGATIVE Final   Influenza B by PCR NEGATIVE NEGATIVE Final    Comment: (NOTE) The Xpert Xpress SARS-CoV-2/FLU/RSV plus assay is intended as an aid in the diagnosis of  influenza from Nasopharyngeal swab specimens and should not be used as a sole basis for treatment. Nasal washings and aspirates are unacceptable for Xpert Xpress SARS-CoV-2/FLU/RSV testing.  Fact Sheet for Patients: EntrepreneurPulse.com.au  Fact Sheet for Healthcare Providers: IncredibleEmployment.be  This test is not yet approved or cleared by the [place] FDA and has been authorized for detection and/or diagnosis of SARS-CoV-2 by FDA under an Emergency Use Authorization (EUA). This EUA will remain in effect (meaning this test can be used) for the duration of the COVID-19 declaration under Section 564(b)(1) of the Act, 21 U.S.C. section 360bbb-3(b)(1), unless the authorization is terminated or revoked.  Performed at [facility], [address], [place], [place] [address]      Labs: Basic Metabolic Panel: Recent Labs  Lab 09/24/20 2103 09/25/20 0349 09/26/20 0401 09/27/20 0511 09/28/20 0439 09/29/20 0449  NA 130* 133* 135 139 137  --   K 3.7 3.5 3.2* 4.7 4.0  --   CL 96* 98 100 104 100  --   CO2 $Re'24 27 26 29 30  'zYq$ --   GLUCOSE 205* 164* 135* 138* 156*  --   BUN 9 10 7* <5* <5*  --   CREATININE 0.62 0.54 0.40* 0.42* 0.45 0.46  CALCIUM 7.8* 7.9* 8.0* 8.3* 8.5*  --   MG  --  1.8 1.9  --   --   --   PHOS  --  3.6  --  1.5* 3.0  --    Liver Function Tests: Recent Labs  Lab 09/24/20 2103 09/25/20 0349 09/26/20 0401 09/27/20 0511 09/28/20 0439  AST 23 16 12*  --   --   ALT $Re'12 12 8  'kDB$ --   --   ALKPHOS 91 81 81  --   --   BILITOT 0.6 0.5 0.4  --   --   PROT 6.6 6.1* 5.7*  --   --   ALBUMIN 3.0* 2.7* 2.5* 2.4* 2.4*   Recent Labs  Lab 09/24/20 2103  LIPASE 41    CBC: Recent Labs  Lab 09/24/20 2103 09/25/20 0349 09/26/20 0401 09/27/20 0511 09/28/20 0439  WBC 27.8* 27.3* 17.5* 10.3 7.9  NEUTROABS 23.9*  --   --   --   --   HGB 12.9 12.1 10.9* 11.4* 11.8*  HCT 39.6 38.2 35.0* 36.0 37.0  MCV 100.8* 102.4* 102.0* 102.3*  100.8*  PLT 509* 432* 367 412* 457*   CBG: Recent  Labs  Lab 09/26/20 0049 09/26/20 0634 09/26/20 1114  GLUCAP 174* 126* 176*   Signed:  Barton Dubois MD.  [facility] 09/29/2020, 1:22 PM

## 2020-09-29 NOTE — Care Management Important Message (Signed)
Important Message  Patient Details  Name: Renee [name] MRN: [id number] Date of Birth: 1940-10-05   Medicare Important Message Given:  Yes     [name] 09/29/2020, 12:22 PM

## 2020-09-29 NOTE — TOC Transition Note (Signed)
Transition of Care [facility]) - CM/SW Discharge Note   Patient Details  Name: CARMINA WALLE MRN: [id number] Date of Birth: 08/09/1940  Transition of Care [facility]) CM/SW Contact:  Iona Beard, [place] Phone Number: 09/29/2020, 3:40 PM   Clinical Narrative:    Pt got insurance auth. CSW confirmed with facility [facility]) that pt can arrive today via family member providing transportation. COVID test was negative. RN called for report. TOC signing off.   Final next level of care: Skilled Nursing Facility Barriers to Discharge: Barriers Resolved   Patient Goals and CMS Choice Patient states their goals for this hospitalization and ongoing recovery are:: SNF CMS Medicare.gov Compare Post Acute Care list provided to:: Patient Choice offered to / list presented to : Patient  Discharge Placement              Patient chooses bed at: Other - please specify in the comment section below: [facility]) Patient to be transferred to facility by: Family      Discharge Plan and Services In-house Referral: Clinical Social Work   Post Acute Care Choice: [place]          DME Arranged: N/A                    Social Determinants of Health (SDOH) Interventions     Readmission Risk Interventions No flowsheet data found.

## 2020-09-30 DIAGNOSIS — D649 Anemia, unspecified: Secondary | ICD-10-CM | POA: Diagnosis not present

## 2020-09-30 DIAGNOSIS — E7849 Other hyperlipidemia: Secondary | ICD-10-CM | POA: Diagnosis not present

## 2020-09-30 DIAGNOSIS — E8809 Other disorders of plasma-protein metabolism, not elsewhere classified: Secondary | ICD-10-CM | POA: Diagnosis not present

## 2020-09-30 DIAGNOSIS — A4189 Other specified sepsis: Secondary | ICD-10-CM | POA: Diagnosis not present

## 2020-10-08 DIAGNOSIS — I10 Essential (primary) hypertension: Secondary | ICD-10-CM | POA: Diagnosis not present

## 2020-10-08 DIAGNOSIS — D649 Anemia, unspecified: Secondary | ICD-10-CM | POA: Diagnosis not present

## 2020-10-08 DIAGNOSIS — E876 Hypokalemia: Secondary | ICD-10-CM | POA: Diagnosis not present

## 2020-10-08 DIAGNOSIS — B965 Pseudomonas (aeruginosa) (mallei) (pseudomallei) as the cause of diseases classified elsewhere: Secondary | ICD-10-CM | POA: Diagnosis not present

## 2020-10-08 DIAGNOSIS — E440 Moderate protein-calorie malnutrition: Secondary | ICD-10-CM | POA: Diagnosis not present

## 2020-10-08 DIAGNOSIS — E785 Hyperlipidemia, unspecified: Secondary | ICD-10-CM | POA: Diagnosis not present

## 2020-10-08 DIAGNOSIS — D696 Thrombocytopenia, unspecified: Secondary | ICD-10-CM | POA: Diagnosis not present

## 2020-10-08 DIAGNOSIS — J9811 Atelectasis: Secondary | ICD-10-CM | POA: Diagnosis not present

## 2020-10-08 DIAGNOSIS — E871 Hypo-osmolality and hyponatremia: Secondary | ICD-10-CM | POA: Diagnosis not present

## 2020-10-08 DIAGNOSIS — I700 Atherosclerosis of aorta: Secondary | ICD-10-CM | POA: Diagnosis not present

## 2020-10-08 DIAGNOSIS — Z7982 Long term (current) use of aspirin: Secondary | ICD-10-CM | POA: Diagnosis not present

## 2020-10-08 DIAGNOSIS — B9561 Methicillin susceptible Staphylococcus aureus infection as the cause of diseases classified elsewhere: Secondary | ICD-10-CM | POA: Diagnosis not present

## 2020-10-08 DIAGNOSIS — K219 Gastro-esophageal reflux disease without esophagitis: Secondary | ICD-10-CM | POA: Diagnosis not present

## 2020-10-08 DIAGNOSIS — T8144XD Sepsis following a procedure, subsequent encounter: Secondary | ICD-10-CM | POA: Diagnosis not present

## 2020-10-08 DIAGNOSIS — A419 Sepsis, unspecified organism: Secondary | ICD-10-CM | POA: Diagnosis not present

## 2020-10-08 DIAGNOSIS — J440 Chronic obstructive pulmonary disease with acute lower respiratory infection: Secondary | ICD-10-CM | POA: Diagnosis not present

## 2020-10-08 DIAGNOSIS — E872 Acidosis: Secondary | ICD-10-CM | POA: Diagnosis not present

## 2020-10-08 DIAGNOSIS — T8141XD Infection following a procedure, superficial incisional surgical site, subsequent encounter: Secondary | ICD-10-CM | POA: Diagnosis not present

## 2020-10-08 DIAGNOSIS — Z96653 Presence of artificial knee joint, bilateral: Secondary | ICD-10-CM | POA: Diagnosis not present

## 2020-10-08 DIAGNOSIS — E8809 Other disorders of plasma-protein metabolism, not elsewhere classified: Secondary | ICD-10-CM | POA: Diagnosis not present

## 2020-10-08 DIAGNOSIS — K5730 Diverticulosis of large intestine without perforation or abscess without bleeding: Secondary | ICD-10-CM | POA: Diagnosis not present

## 2020-10-08 DIAGNOSIS — N390 Urinary tract infection, site not specified: Secondary | ICD-10-CM | POA: Diagnosis not present

## 2020-10-08 DIAGNOSIS — Z9181 History of falling: Secondary | ICD-10-CM | POA: Diagnosis not present

## 2020-10-08 DIAGNOSIS — E1165 Type 2 diabetes mellitus with hyperglycemia: Secondary | ICD-10-CM | POA: Diagnosis not present

## 2020-10-11 DIAGNOSIS — E785 Hyperlipidemia, unspecified: Secondary | ICD-10-CM | POA: Diagnosis not present

## 2020-10-11 DIAGNOSIS — E876 Hypokalemia: Secondary | ICD-10-CM | POA: Diagnosis not present

## 2020-10-11 DIAGNOSIS — E8809 Other disorders of plasma-protein metabolism, not elsewhere classified: Secondary | ICD-10-CM | POA: Diagnosis not present

## 2020-10-11 DIAGNOSIS — J9811 Atelectasis: Secondary | ICD-10-CM | POA: Diagnosis not present

## 2020-10-11 DIAGNOSIS — K219 Gastro-esophageal reflux disease without esophagitis: Secondary | ICD-10-CM | POA: Diagnosis not present

## 2020-10-11 DIAGNOSIS — K5730 Diverticulosis of large intestine without perforation or abscess without bleeding: Secondary | ICD-10-CM | POA: Diagnosis not present

## 2020-10-11 DIAGNOSIS — T8144XD Sepsis following a procedure, subsequent encounter: Secondary | ICD-10-CM | POA: Diagnosis not present

## 2020-10-11 DIAGNOSIS — I700 Atherosclerosis of aorta: Secondary | ICD-10-CM | POA: Diagnosis not present

## 2020-10-11 DIAGNOSIS — E1165 Type 2 diabetes mellitus with hyperglycemia: Secondary | ICD-10-CM | POA: Diagnosis not present

## 2020-10-11 DIAGNOSIS — Z9181 History of falling: Secondary | ICD-10-CM | POA: Diagnosis not present

## 2020-10-11 DIAGNOSIS — B9561 Methicillin susceptible Staphylococcus aureus infection as the cause of diseases classified elsewhere: Secondary | ICD-10-CM | POA: Diagnosis not present

## 2020-10-11 DIAGNOSIS — E440 Moderate protein-calorie malnutrition: Secondary | ICD-10-CM | POA: Diagnosis not present

## 2020-10-11 DIAGNOSIS — N390 Urinary tract infection, site not specified: Secondary | ICD-10-CM | POA: Diagnosis not present

## 2020-10-11 DIAGNOSIS — J440 Chronic obstructive pulmonary disease with acute lower respiratory infection: Secondary | ICD-10-CM | POA: Diagnosis not present

## 2020-10-11 DIAGNOSIS — A419 Sepsis, unspecified organism: Secondary | ICD-10-CM | POA: Diagnosis not present

## 2020-10-11 DIAGNOSIS — B965 Pseudomonas (aeruginosa) (mallei) (pseudomallei) as the cause of diseases classified elsewhere: Secondary | ICD-10-CM | POA: Diagnosis not present

## 2020-10-11 DIAGNOSIS — D696 Thrombocytopenia, unspecified: Secondary | ICD-10-CM | POA: Diagnosis not present

## 2020-10-11 DIAGNOSIS — D649 Anemia, unspecified: Secondary | ICD-10-CM | POA: Diagnosis not present

## 2020-10-11 DIAGNOSIS — T8141XD Infection following a procedure, superficial incisional surgical site, subsequent encounter: Secondary | ICD-10-CM | POA: Diagnosis not present

## 2020-10-11 DIAGNOSIS — Z7982 Long term (current) use of aspirin: Secondary | ICD-10-CM | POA: Diagnosis not present

## 2020-10-11 DIAGNOSIS — Z96653 Presence of artificial knee joint, bilateral: Secondary | ICD-10-CM | POA: Diagnosis not present

## 2020-10-11 DIAGNOSIS — E871 Hypo-osmolality and hyponatremia: Secondary | ICD-10-CM | POA: Diagnosis not present

## 2020-10-12 ENCOUNTER — Other Ambulatory Visit: Payer: Self-pay | Admitting: Surgery

## 2020-10-12 ENCOUNTER — Ambulatory Visit (HOSPITAL_COMMUNITY): Payer: Medicare Other

## 2020-10-12 ENCOUNTER — Other Ambulatory Visit (HOSPITAL_COMMUNITY): Payer: Self-pay | Admitting: Surgery

## 2020-10-12 DIAGNOSIS — K8689 Other specified diseases of pancreas: Secondary | ICD-10-CM | POA: Diagnosis not present

## 2020-10-13 ENCOUNTER — Encounter (HOSPITAL_COMMUNITY): Payer: Self-pay | Admitting: Emergency Medicine

## 2020-10-13 ENCOUNTER — Emergency Department (HOSPITAL_COMMUNITY): Payer: Medicare Other

## 2020-10-13 ENCOUNTER — Ambulatory Visit (HOSPITAL_COMMUNITY): Payer: Medicare Other

## 2020-10-13 ENCOUNTER — Other Ambulatory Visit: Payer: Self-pay

## 2020-10-13 ENCOUNTER — Observation Stay (HOSPITAL_COMMUNITY)
Admission: EM | Admit: 2020-10-13 | Discharge: 2020-10-14 | Disposition: A | Discharge status: Home / Self Care | Payer: Medicare Other | Attending: Surgery | Admitting: Surgery

## 2020-10-13 DIAGNOSIS — I10 Essential (primary) hypertension: Secondary | ICD-10-CM | POA: Diagnosis not present

## 2020-10-13 DIAGNOSIS — B965 Pseudomonas (aeruginosa) (mallei) (pseudomallei) as the cause of diseases classified elsewhere: Secondary | ICD-10-CM | POA: Diagnosis not present

## 2020-10-13 DIAGNOSIS — R110 Nausea: Secondary | ICD-10-CM

## 2020-10-13 DIAGNOSIS — Z7982 Long term (current) use of aspirin: Secondary | ICD-10-CM | POA: Insufficient documentation

## 2020-10-13 DIAGNOSIS — J9811 Atelectasis: Secondary | ICD-10-CM | POA: Diagnosis not present

## 2020-10-13 DIAGNOSIS — K5730 Diverticulosis of large intestine without perforation or abscess without bleeding: Secondary | ICD-10-CM | POA: Diagnosis not present

## 2020-10-13 DIAGNOSIS — J440 Chronic obstructive pulmonary disease with acute lower respiratory infection: Secondary | ICD-10-CM | POA: Diagnosis not present

## 2020-10-13 DIAGNOSIS — Z79899 Other long term (current) drug therapy: Secondary | ICD-10-CM | POA: Insufficient documentation

## 2020-10-13 DIAGNOSIS — Z85828 Personal history of other malignant neoplasm of skin: Secondary | ICD-10-CM | POA: Insufficient documentation

## 2020-10-13 DIAGNOSIS — J449 Chronic obstructive pulmonary disease, unspecified: Secondary | ICD-10-CM | POA: Diagnosis not present

## 2020-10-13 DIAGNOSIS — E440 Moderate protein-calorie malnutrition: Secondary | ICD-10-CM | POA: Diagnosis not present

## 2020-10-13 DIAGNOSIS — D649 Anemia, unspecified: Secondary | ICD-10-CM | POA: Diagnosis not present

## 2020-10-13 DIAGNOSIS — Z853 Personal history of malignant neoplasm of breast: Secondary | ICD-10-CM | POA: Diagnosis not present

## 2020-10-13 DIAGNOSIS — E8809 Other disorders of plasma-protein metabolism, not elsewhere classified: Secondary | ICD-10-CM | POA: Diagnosis not present

## 2020-10-13 DIAGNOSIS — E1165 Type 2 diabetes mellitus with hyperglycemia: Secondary | ICD-10-CM | POA: Diagnosis not present

## 2020-10-13 DIAGNOSIS — R1084 Generalized abdominal pain: Secondary | ICD-10-CM

## 2020-10-13 DIAGNOSIS — Z87891 Personal history of nicotine dependence: Secondary | ICD-10-CM | POA: Insufficient documentation

## 2020-10-13 DIAGNOSIS — Z96653 Presence of artificial knee joint, bilateral: Secondary | ICD-10-CM | POA: Diagnosis not present

## 2020-10-13 DIAGNOSIS — I700 Atherosclerosis of aorta: Secondary | ICD-10-CM | POA: Diagnosis not present

## 2020-10-13 DIAGNOSIS — D696 Thrombocytopenia, unspecified: Secondary | ICD-10-CM | POA: Diagnosis not present

## 2020-10-13 DIAGNOSIS — B9561 Methicillin susceptible Staphylococcus aureus infection as the cause of diseases classified elsewhere: Secondary | ICD-10-CM | POA: Diagnosis not present

## 2020-10-13 DIAGNOSIS — T8144XD Sepsis following a procedure, subsequent encounter: Secondary | ICD-10-CM | POA: Diagnosis not present

## 2020-10-13 DIAGNOSIS — E871 Hypo-osmolality and hyponatremia: Secondary | ICD-10-CM | POA: Diagnosis not present

## 2020-10-13 DIAGNOSIS — K219 Gastro-esophageal reflux disease without esophagitis: Secondary | ICD-10-CM | POA: Diagnosis not present

## 2020-10-13 DIAGNOSIS — A419 Sepsis, unspecified organism: Secondary | ICD-10-CM | POA: Diagnosis not present

## 2020-10-13 DIAGNOSIS — R627 Adult failure to thrive: Secondary | ICD-10-CM | POA: Diagnosis not present

## 2020-10-13 DIAGNOSIS — N390 Urinary tract infection, site not specified: Secondary | ICD-10-CM | POA: Diagnosis not present

## 2020-10-13 DIAGNOSIS — E119 Type 2 diabetes mellitus without complications: Secondary | ICD-10-CM | POA: Diagnosis not present

## 2020-10-13 DIAGNOSIS — E785 Hyperlipidemia, unspecified: Secondary | ICD-10-CM | POA: Diagnosis not present

## 2020-10-13 DIAGNOSIS — R531 Weakness: Secondary | ICD-10-CM | POA: Diagnosis not present

## 2020-10-13 DIAGNOSIS — Z9181 History of falling: Secondary | ICD-10-CM | POA: Diagnosis not present

## 2020-10-13 DIAGNOSIS — R109 Unspecified abdominal pain: Secondary | ICD-10-CM | POA: Diagnosis not present

## 2020-10-13 DIAGNOSIS — E876 Hypokalemia: Secondary | ICD-10-CM | POA: Diagnosis not present

## 2020-10-13 DIAGNOSIS — T8141XD Infection following a procedure, superficial incisional surgical site, subsequent encounter: Secondary | ICD-10-CM | POA: Diagnosis not present

## 2020-10-13 LAB — CBC WITH DIFFERENTIAL/PLATELET
Abs Immature Granulocytes: 0.04 10*3/uL (ref 0.00–0.07)
Basophils Absolute: 0 10*3/uL (ref 0.0–0.1)
Basophils Relative: 0 %
Eosinophils Absolute: 0 10*3/uL (ref 0.0–0.5)
Eosinophils Relative: 0 %
HCT: 39.8 % (ref 36.0–46.0)
Hemoglobin: 13 g/dL (ref 12.0–15.0)
Immature Granulocytes: 0 %
Lymphocytes Relative: 17 %
Lymphs Abs: 2.1 10*3/uL (ref 0.7–4.0)
MCH: 32.2 pg (ref 26.0–34.0)
MCHC: 32.7 g/dL (ref 30.0–36.0)
MCV: 98.5 fL (ref 80.0–100.0)
Monocytes Absolute: 1.4 10*3/uL — ABNORMAL HIGH (ref 0.1–1.0)
Monocytes Relative: 11 %
Neutro Abs: 8.9 10*3/uL — ABNORMAL HIGH (ref 1.7–7.7)
Neutrophils Relative %: 72 %
Platelets: 555 10*3/uL — ABNORMAL HIGH (ref 150–400)
RBC: 4.04 MIL/uL (ref 3.87–5.11)
RDW: 12.4 % (ref 11.5–15.5)
WBC: 12.4 10*3/uL — ABNORMAL HIGH (ref 4.0–10.5)
nRBC: 0 % (ref 0.0–0.2)

## 2020-10-13 LAB — COMPREHENSIVE METABOLIC PANEL
ALT: 14 U/L (ref 0–44)
AST: 24 U/L (ref 15–41)
Albumin: 2.9 g/dL — ABNORMAL LOW (ref 3.5–5.0)
Alkaline Phosphatase: 90 U/L (ref 38–126)
Anion gap: 9 (ref 5–15)
BUN: 6 mg/dL — ABNORMAL LOW (ref 8–23)
CO2: 25 mmol/L (ref 22–32)
Calcium: 9 mg/dL (ref 8.9–10.3)
Chloride: 97 mmol/L — ABNORMAL LOW (ref 98–111)
Creatinine, Ser: 0.49 mg/dL (ref 0.44–1.00)
GFR, Estimated: >60 mL/min (ref >60)
Glucose, Bld: 205 mg/dL — ABNORMAL HIGH (ref 70–99)
Potassium: 3.8 mmol/L (ref 3.5–5.1)
Sodium: 131 mmol/L — ABNORMAL LOW (ref 135–145)
Total Bilirubin: 0.4 mg/dL (ref 0.3–1.2)
Total Protein: 6.7 g/dL (ref 6.5–8.1)

## 2020-10-13 LAB — LIPASE, BLOOD: Lipase: 45 U/L (ref 11–51)

## 2020-10-13 MED ORDER — POLYETHYLENE GLYCOL 3350 17 G PO PACK: 17.0000 g | PACK | Freq: Every day | ORAL | Status: DC | PRN

## 2020-10-13 MED ORDER — PANTOPRAZOLE SODIUM 40 MG PO TBEC
40.0000 mg | DELAYED_RELEASE_TABLET | Freq: Every day | ORAL | Status: DC
  Administered 2020-10-14: 40 mg via ORAL
  Filled 2020-10-13: qty 1

## 2020-10-13 MED ORDER — IOHEXOL 350 MG/ML SOLN
80.0000 mL | Freq: Once | INTRAVENOUS | Status: AC | PRN
  Administered 2020-10-13: 80 mL via INTRAVENOUS

## 2020-10-13 MED ORDER — DOCUSATE SODIUM 100 MG PO CAPS
100.0000 mg | ORAL_CAPSULE | Freq: Two times a day (BID) | ORAL | Status: DC
  Administered 2020-10-13 – 2020-10-14 (×2): 100 mg via ORAL
  Filled 2020-10-13 (×2): qty 1

## 2020-10-13 MED ORDER — ENOXAPARIN SODIUM 40 MG/0.4ML IJ SOSY: 40.0000 mg | PREFILLED_SYRINGE | INTRAMUSCULAR | Status: DC

## 2020-10-13 MED ORDER — ENSURE ENLIVE PO LIQD: 237.0000 mL | Freq: Two times a day (BID) | ORAL | Status: DC

## 2020-10-13 MED ORDER — METOCLOPRAMIDE HCL 10 MG PO TABS
5.0000 mg | ORAL_TABLET | Freq: Three times a day (TID) | ORAL | Status: DC
  Administered 2020-10-13 – 2020-10-14 (×2): 5 mg via ORAL
  Filled 2020-10-13 (×2): qty 1

## 2020-10-13 MED ORDER — METOPROLOL TARTRATE 25 MG PO TABS
50.0000 mg | ORAL_TABLET | Freq: Two times a day (BID) | ORAL | Status: DC
  Administered 2020-10-13 – 2020-10-14 (×2): 50 mg via ORAL
  Filled 2020-10-13 (×2): qty 2

## 2020-10-13 MED ORDER — SODIUM CHLORIDE 0.9 % IV SOLN: INTRAVENOUS | Status: DC

## 2020-10-13 MED ORDER — ENOXAPARIN SODIUM 40 MG/0.4ML IJ SOSY
40.0000 mg | PREFILLED_SYRINGE | INTRAMUSCULAR | Status: DC
  Administered 2020-10-13: 40 mg via SUBCUTANEOUS
  Filled 2020-10-13: qty 0.4

## 2020-10-13 MED ORDER — ACETAMINOPHEN 325 MG PO TABS
650.0000 mg | ORAL_TABLET | Freq: Four times a day (QID) | ORAL | Status: DC | PRN
  Administered 2020-10-13 – 2020-10-14 (×2): 650 mg via ORAL
  Filled 2020-10-13 (×2): qty 2

## 2020-10-13 MED ORDER — SODIUM CHLORIDE 0.9 % IV BOLUS
1000.0000 mL | Freq: Once | INTRAVENOUS | Status: AC
  Administered 2020-10-13: 1000 mL via INTRAVENOUS

## 2020-10-13 MED ORDER — ASPIRIN EC 81 MG PO TBEC
81.0000 mg | DELAYED_RELEASE_TABLET | Freq: Every day | ORAL | Status: DC
  Administered 2020-10-14: 81 mg via ORAL
  Filled 2020-10-13: qty 1

## 2020-10-13 NOTE — ED Triage Notes (Signed)
Patient here for evaluation of possible infection in postoperative drain after abdominal surgery August 1 this year. Patient states the drain site has a foul odor, was seen at surgeon's office for postop follow up and sent for bloodwork and an abdominal CT, patient here because she states she does not want to see her surgeon again and does not feel like she can drink the contrast for the CT.

## 2020-10-13 NOTE — ED Provider Notes (Signed)
[facility] EMERGENCY DEPARTMENT Provider Note   CSN: [id number] Arrival date & time: 10/13/20  1103     History Chief Complaint  Patient presents with   Post-op Problem    Renee [name] is a 80 y.o. female.  Pt reports she is not able to eat or drink  except for sips of ginger ale. Pt tried ensure but it comes back up Pt reports she has had drain in for 44 days.  Pt upset that she still has infection.  Pt complains of drain having a foul odor.     The history is provided by the patient and the spouse. No language interpreter was used.  Weakness Severity:  Moderate Onset quality:  Gradual Timing:  Constant Progression:  Worsening Chronicity:  New Relieved by:  Nothing Associated symptoms: nausea       Past Medical History:  Diagnosis Date   Anemia    history only at age 16 yrs olds, no current problems   Aortic atherosclerosis (HCC)    Arthritis    oa   Blood transfusion 1948   Blood transfusion without reported diagnosis    Breast cancer ([place]) 1994   bilateral / HX skin cancer   Carotid atherosclerosis    Cataract    both eyes   Constipation    COPD (chronic obstructive pulmonary disease) (HCC)    no meds/no inaler   Depression    Diabetes ([place])    type 2 - diet controlled, no meds   Disc disease, degenerative, cervical    trouble turing head and neck at times   Diverticulosis    DVT (deep venous thrombosis) ([place]) 2002   left leg   Dyspnea    occasional with exertion   Dysrhythmia    Family history of adverse reaction to anesthesia    Fibromyalgia    GAD (generalized anxiety disorder)    GERD (gastroesophageal reflux disease)    History of vertigo    Hx of skin cancer, basal cell    Hyperlipidemia    Hypertension    Measles as child   Mumps age 74   Osteoarthritis    Osteoporosis    Pancreatic cyst    Pancreatitis    PONV (postoperative nausea and vomiting)    PONV   Pulmonary nodule    Right renal mass    Seborrheic  keratosis    Sinus tachycardia    Sinus tachycardia    Tubular adenoma of colon     Patient Active Problem List   Diagnosis Date Noted   Emesis, persistent    Intra-abdominal infection    Malnutrition of moderate degree 09/27/2020   Sepsis ([place]) 09/25/2020   UTI (urinary tract infection) 09/25/2020   Thrombocytosis 09/25/2020   Leukocytosis 09/25/2020   Hypoalbuminemia 09/25/2020   Hyponatremia 09/25/2020   Hyperglycemia 09/25/2020   Lactic acidosis 09/25/2020   Prolonged QT interval 09/25/2020   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   Primary localized osteoarthritis of left hip 08/09/2020   RBBB 07/05/2020   Dizziness 07/05/2020   Lumbar pain 10/08/2017   Cervical spine pain 02/21/2017   Carotid stenosis 07/23/2015   Multiple pulmonary nodules 02/11/2015   Essential hypertension 07/08/2013   Sinus tachycardia 01/08/2013   Difficulty walking 11/04/2012   Pain of left hip joint 11/04/2012   GERD (gastroesophageal reflux disease) 10/15/2012   Hyperlipidemia 10/15/2012   Dyspnea 02/09/2012    Past Surgical History:  Procedure Laterality Date  ABDOMINAL HYSTERECTOMY     achilles tendon adn 2 bone spurs removed Left 05-2012   APPENDECTOMY     BACK SURGERY  1976   lower   BASAL CELL CARCINOMA EXCISION     bilateral mastectomy with tramflap reconstruction  1994   BIOPSY  05/13/2020   Procedure: BIOPSY;  Surgeon: Irving Copas., MD;  Location: [place];  Service: Gastroenterology;;   BREAST REDUCTION SURGERY     CARPAL TUNNEL RELEASE Right 03/2017   COLONOSCOPY     ESOPHAGOGASTRODUODENOSCOPY (EGD) WITH PROPOFOL N/A 05/13/2020   Procedure: ESOPHAGOGASTRODUODENOSCOPY (EGD) WITH PROPOFOL;  Surgeon: Irving Copas., MD;  Location: [place];  Service: Gastroenterology;  Laterality: N/A;   EUS N/A 05/13/2020   Procedure: UPPER ENDOSCOPIC ULTRASOUND (EUS) RADIAL;  Surgeon: Irving Copas., MD;   Location: [place];  Service: Gastroenterology;  Laterality: N/A;   FOOT SURGERY     KNEE ARTHROSCOPY Right 2008   KNEE SURGERY  2002   arthroscopy-bilateral   LAPAROSCOPIC LIVER ULTRASOUND N/A 08/30/2020   Procedure: LAPAROSCOPIC INTRAOPERATIVE ULTRASOUND;  Surgeon: Dwan Bolt, MD;  Location: [place];  Service: General;  Laterality: N/A;   LAPAROSCOPIC SPLENECTOMY N/A 08/30/2020   Procedure: LAPAROSCOPIC SPLENECTOMY;  Surgeon: Dwan Bolt, MD;  Location: [place];  Service: General;  Laterality: N/A;   MASTECTOMY     bilateral   PANCREATECTOMY N/A 08/30/2020   Procedure: LAPAROSCOPIC DISTAL PANCREATECTOMY;  Surgeon: Dwan Bolt, MD;  Location: [place];  Service: General;  Laterality: N/A;   PARTIAL HYSTERECTOMY     POLYPECTOMY     SHOULDER SURGERY Left    TONSILLECTOMY     TOTAL KNEE ARTHROPLASTY Left 10/14/2012   Procedure: LEFT TOTAL KNEE ARTHROPLASTY;  Surgeon: Gearlean Alf, MD;  Location: [facility];  Service: Orthopedics;  Laterality: Left;   TOTAL KNEE ARTHROPLASTY Right 10/20/2013   Procedure: RIGHT TOTAL KNEE ARTHROPLASTY;  Surgeon: Gearlean Alf, MD;  Location: [facility];  Service: Orthopedics;  Laterality: Right;     OB History   No obstetric history on file.     Family History  Problem Relation Age of Onset   Alzheimer's disease Mother    Alcohol abuse Father    Sudden death Father    Liver disease Father        fatty liver disease; alcohol abuse   Other Father        gastritis   Healthy Brother    Alcohol abuse Daughter    COPD Daughter    Healthy Grandchild    Heart disease Maternal Grandfather    CAD Maternal Grandfather    Sudden death Brother        at birth   Unexplained death Daughter    Alcohol abuse Daughter    Healthy Sister    Colon cancer Neg Hx    Colon polyps Neg Hx    Rectal cancer Neg Hx    Stomach cancer Neg Hx    Esophageal cancer Neg Hx     Social History   Tobacco Use   Smoking status: Former    Packs/day: 1.00    Years:  20.00    Pack years: 20.00    Types: Cigarettes    Quit date: 02/11/1992    Years since quitting: 28.6   Smokeless tobacco: Never  Vaping Use   Vaping Use: Never used  Substance Use Topics   Alcohol use: Not Currently    Comment: occasional wine-rare   Drug use: No  Home Medications Prior to Admission medications   Medication Sig Start Date End Date Taking? Authorizing Provider  acetaminophen (TYLENOL) 325 MG tablet Take 2 tablets (650 mg total) by mouth every 6 (six) hours as needed for mild pain or headache. 09/03/20   Dwan Bolt, MD  aspirin EC 81 MG tablet Take 1 tablet (81 mg total) by mouth daily. 01/18/18   Dorothy Spark, MD  Calcium Carbonate-Vit D-Min (CALCIUM 1200 PO) Take 1,200 mg by mouth daily.    [provider]  ciprofloxacin (CIPRO) 500 MG tablet Take 1 tablet (500 mg total) by mouth 2 (two) times daily. 09/29/20   Barton Dubois, MD  denosumab (PROLIA) 60 MG/ML SOSY injection Inject 60 mg into the skin every 6 (six) months.  06/10/18   [provider]  docusate sodium (COLACE) 100 MG capsule Take 1 capsule (100 mg total) by mouth 2 (two) times daily as needed for mild constipation. 09/29/20   Barton Dubois, MD  esomeprazole (NEXIUM) 40 MG capsule Take 1 capsule (40 mg total) by mouth daily. 05/13/20 05/13/21  Mansouraty, Telford Nab., MD  Evolocumab (REPATHA) 140 MG/ML SOSY Inject 140 mg into the skin every 14 (fourteen) days.    [provider]  feeding supplement (ENSURE ENLIVE / ENSURE PLUS) LIQD Take 237 mLs by mouth 2 (two) times daily between meals. 09/29/20   Barton Dubois, MD  HYDROcodone-acetaminophen (NORCO/VICODIN) 5-325 MG tablet Take 1 tablet by mouth every 6 (six) hours as needed for severe pain. 09/29/20   Barton Dubois, MD  metoCLOPramide (REGLAN) 5 MG tablet Take 1 tablet (5 mg total) by mouth every 8 (eight) hours as needed for nausea or vomiting. 09/07/20   Dwan Bolt, MD  metoprolol tartrate (LOPRESSOR) 50 MG tablet  TAKE 1 TABLET(50 MG) BY MOUTH TWICE DAILY Patient taking differently: Take 50 mg by mouth 2 (two) times daily. 08/12/20   Richardson Dopp T, PA-C  Multiple Vitamin (MULTIVITAMIN) tablet Take 1 tablet by mouth daily.    [provider]  polyethylene glycol (MIRALAX / GLYCOLAX) 17 g packet Take 17 g by mouth daily as needed for mild constipation. 09/29/20   Barton Dubois, MD  saccharomyces boulardii (FLORASTOR) 250 MG capsule Take 1 capsule (250 mg total) by mouth 2 (two) times daily. 09/29/20   Barton Dubois, MD  VITAMIN D PO Take 1,000 Units by mouth daily.    [provider]    Allergies    Atorvastatin, Codeine, Ibuprofen, Lisinopril, and Aspirin  Review of Systems   Review of Systems  Gastrointestinal:  Positive for nausea.  Neurological:  Positive for weakness.  All other systems reviewed and are negative.  Physical Exam Updated Vital Signs BP 133/69 (BP Location: Left Arm)   Pulse 99   Temp 99.2 F (37.3 C)   Resp 14   LMP  (LMP Unknown)   SpO2 98%   Physical Exam Vitals reviewed.  Constitutional:      Appearance: Normal appearance.  HENT:     Mouth/Throat:     Mouth: Mucous membranes are moist.  Eyes:     Pupils: Pupils are equal, round, and reactive to light.  Cardiovascular:     Rate and Rhythm: Normal rate.     Pulses: Normal pulses.  Pulmonary:     Effort: Pulmonary effort is normal.  Abdominal:     General: Abdomen is flat.     Tenderness: There is no abdominal tenderness.  Musculoskeletal:        General: Normal  range of motion.  Skin:    General: Skin is warm.  Neurological:     General: No focal deficit present.     Mental Status: She is alert.  Psychiatric:        Mood and Affect: Mood normal.    ED Results / Procedures / Treatments   Labs (all labs ordered are listed, but only abnormal results are displayed) Labs Reviewed  CBC WITH DIFFERENTIAL/PLATELET - Abnormal; Notable for the following components:      Result Value   WBC  12.4 (*)    Platelets 555 (*)    Neutro Abs 8.9 (*)    Monocytes Absolute 1.4 (*)    All other components within normal limits  COMPREHENSIVE METABOLIC PANEL - Abnormal; Notable for the following components:   Sodium 131 (*)    Chloride 97 (*)    Glucose, Bld 205 (*)    BUN 6 (*)    Albumin 2.9 (*)    All other components within normal limits  RESP PANEL BY RT-PCR (FLU A&B, COVID) ARPGX2  LIPASE, BLOOD    EKG None  Radiology CT ABDOMEN PELVIS W CONTRAST  Result Date: 10/13/2020 CLINICAL DATA:  Pancreatectomy, 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 Abdominal pain. EXAM: CT ABDOMEN AND PELVIS WITH CONTRAST TECHNIQUE: Multidetector CT imaging of the abdomen and pelvis was performed using the standard protocol following bolus administration of intravenous contrast. CONTRAST:  37mL OMNIPAQUE IOHEXOL 350 MG/ML SOLN COMPARISON:  CT chest abdomen and pelvis 09/24/2020. FINDINGS: Lower chest: There is a small left pleural effusion with minimal left basilar atelectasis, mildly decreased. Hepatobiliary: No focal liver abnormality is seen. No gallstones, gallbladder wall thickening, or biliary dilatation. Pancreas: Patient is status post distal pancreatectomy. There is minimal fluid and stranding in surgical bed. Left-sided percutaneous drainage catheter is seen with distal tip in the surgical bed. This catheter is unchanged in position. Pancreatic head and body appear within normal limits. Spleen: Surgically absent. There is minimal stranding in the surgical bed. No fluid collection. Adrenals/Urinary Tract: The bilateral adrenal glands appear within normal limits. There is a stable 1 cm indeterminate exophytic left renal lesion. There is no hydronephrosis or perinephric fat stranding. The bladder is within normal limits. Stomach/Bowel: There is no evidence for bowel obstruction or free intraperitoneal air. There is colonic diverticulosis without evidence for acute diverticulitis. The appendix is not visualized. Small  bowel loops are within normal limits. The stomach is decompressed and within normal limits. Again noted is posterior gastric wall thickening and edema which approximate the pancreas with loss of fat plane, similar to the prior examination. There is questionable early developing fluid collection at this level image 4/19 measuring 1.3 x 1.3 cm. There is no free air in this region. Vascular/Lymphatic: Aortic atherosclerosis. No enlarged abdominal or pelvic lymph nodes. Reproductive: Status post hysterectomy. No adnexal masses. Other: Midline abdominal scarring is present. There is some intramuscular abdominal wall edema without focal fluid collection at the level of the percutaneous drainage catheter in the anterior left abdomen. Appearance is similar to the prior study. There are surgical clips in the right inguinal region and along the left inferior abdominal wall, unchanged. No abdominopelvic ascites. Musculoskeletal: Degenerative changes affect the spine and hips. IMPRESSION: 1. 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 Adhesion of pancreas and gastric wall is again noted with gastric wall edema. There is questionable new small developing fluid collection at this level. There is no extraluminal gas. Fistula or early abscess cannot be excluded. 2. Percutaneous  drainage catheter is unchanged in position. 3. Small left pleural effusion has decreased. 4. Colonic diverticulosis without evidence for acute diverticulitis. 5.  Aortic Atherosclerosis (ICD10-I70.0). Electronically Signed   By: Ronney Asters M.D.   On: 10/13/2020 15:15    Procedures Procedures   Medications Ordered in ED Medications  sodium chloride 0.9 % bolus 1,000 mL (has no administration in time range)  iohexol (OMNIPAQUE) 350 MG/ML injection 80 mL (80 mLs Intravenous Contrast Given 10/13/20 1443)    ED Course  I have reviewed the triage vital signs and the nursing notes.  Pertinent labs & imaging results that were  available during my care of the patient were reviewed by me and considered in my medical decision making (see chart for details).    MDM Rules/Calculators/A&P                           MDM:  Ct and labs reviewed, Pt and husband do not feel like she can go home.  Dr. Zenia Resides advised she will admit  Final Clinical Impression(s) / ED Diagnoses Final diagnoses:  Generalized abdominal pain  Weakness  Nausea    Rx / DC Orders ED Discharge Orders     None        Sidney Ace 10/13/20 2023    Truddie Hidden, MD 10/13/20 2333

## 2020-10-13 NOTE — H&P (Signed)
Renee [name] 1940/09/23  [id number].    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 Her postop course has been complicated by a pancreatic fistula, which is controlled with her surgical JP drain. She was seen in clinic yesterday, at which time she reported persistent nausea and weakness. Her drain suture had pulled through the skin and the drain had been retracted by about 15 cm. She has not had fevers or chills. Labs yesterday were unremarkable and prealbumin was normal. An outpatient CT scan was ordered and scheduled for today, but the patient presented to the ED today with complaints of left sided abdominal pain and nausea. A CT scan was done and showed the drain in position next to the remnant pancreas, with no intraabdominal fluid collections. Labs showed a WBC of 12.  ROS: ROS  Family History  Problem Relation Age of Onset   Alzheimer's disease Mother    Alcohol abuse Father    Sudden death Father    Liver disease Father        fatty liver disease; alcohol abuse   Other Father        gastritis   Healthy Brother    Alcohol abuse Daughter    COPD Daughter    Healthy Grandchild    Heart disease Maternal Grandfather    CAD Maternal Grandfather    Sudden death Brother        at birth   Unexplained death Daughter    Alcohol abuse Daughter    Healthy Sister    Colon cancer Neg Hx    Colon polyps Neg Hx    Rectal cancer Neg Hx    Stomach cancer Neg Hx    Esophageal cancer Neg Hx     Past Medical History:  Diagnosis Date   Anemia    history only at age 70 yrs olds, no current problems   Aortic atherosclerosis ([place])    Arthritis    oa   Blood transfusion 1948   Blood transfusion without reported diagnosis    Breast cancer ([place]) 1994   bilateral / HX skin cancer   Carotid  atherosclerosis    Cataract    both eyes   Constipation    COPD (chronic obstructive pulmonary disease) ([place])    no meds/no inaler   Depression    Diabetes ([place])    type 2 - diet controlled, no meds   Disc disease, degenerative, cervical    trouble turing head and neck at times   Diverticulosis    DVT (deep venous thrombosis) ([place]) 2002   left leg   Dyspnea    occasional with exertion   Dysrhythmia    Family history of adverse reaction to anesthesia    Fibromyalgia    GAD (generalized anxiety disorder)    GERD (gastroesophageal reflux disease)    History of vertigo    Hx of skin cancer, basal cell    Hyperlipidemia    Hypertension    Measles as child   Mumps age 46   Osteoarthritis    Osteoporosis    Pancreatic cyst    Pancreatitis    PONV (postoperative nausea and vomiting)    PONV   Pulmonary nodule    Right renal mass    Seborrheic keratosis    Sinus tachycardia    Sinus tachycardia  Tubular adenoma of colon     Past Surgical History:  Procedure Laterality Date   ABDOMINAL HYSTERECTOMY     achilles tendon adn 2 bone spurs removed Left 05-2012   APPENDECTOMY     BACK SURGERY  1976   lower   BASAL CELL CARCINOMA EXCISION     bilateral mastectomy with tramflap reconstruction  1994   BIOPSY  05/13/2020   Procedure: BIOPSY;  Surgeon: Irving Copas., MD;  Location: [place];  Service: Gastroenterology;;   BREAST REDUCTION SURGERY     CARPAL TUNNEL RELEASE Right 03/2017   COLONOSCOPY     ESOPHAGOGASTRODUODENOSCOPY (EGD) WITH PROPOFOL N/A 05/13/2020   Procedure: ESOPHAGOGASTRODUODENOSCOPY (EGD) WITH PROPOFOL;  Surgeon: Irving Copas., MD;  Location: [place];  Service: Gastroenterology;  Laterality: N/A;   EUS N/A 05/13/2020   Procedure: UPPER ENDOSCOPIC ULTRASOUND (EUS) RADIAL;  Surgeon: Irving Copas., MD;  Location: [place];  Service: Gastroenterology;  Laterality: N/A;   FOOT SURGERY     KNEE ARTHROSCOPY Right 2008    KNEE SURGERY  2002   arthroscopy-bilateral   LAPAROSCOPIC LIVER ULTRASOUND N/A 08/30/2020   Procedure: LAPAROSCOPIC INTRAOPERATIVE ULTRASOUND;  Surgeon: Dwan Bolt, MD;  Location: [place];  Service: General;  Laterality: N/A;   LAPAROSCOPIC SPLENECTOMY N/A 08/30/2020   Procedure: LAPAROSCOPIC SPLENECTOMY;  Surgeon: Dwan Bolt, MD;  Location: [place];  Service: General;  Laterality: N/A;   MASTECTOMY     bilateral   PANCREATECTOMY N/A 08/30/2020   Procedure: LAPAROSCOPIC DISTAL PANCREATECTOMY;  Surgeon: Dwan Bolt, MD;  Location: [place];  Service: General;  Laterality: N/A;   PARTIAL HYSTERECTOMY     POLYPECTOMY     SHOULDER SURGERY Left    TONSILLECTOMY     TOTAL KNEE ARTHROPLASTY Left 10/14/2012   Procedure: LEFT TOTAL KNEE ARTHROPLASTY;  Surgeon: Gearlean Alf, MD;  Location: [facility];  Service: Orthopedics;  Laterality: Left;   TOTAL KNEE ARTHROPLASTY Right 10/20/2013   Procedure: RIGHT TOTAL KNEE ARTHROPLASTY;  Surgeon: Gearlean Alf, MD;  Location: [facility];  Service: Orthopedics;  Laterality: Right;    Social History:  reports that she quit smoking about 28 years ago. Her smoking use included cigarettes. She has a 20.00 pack-year smoking history. She has never used smokeless tobacco. She reports that she does not currently use alcohol. She reports that she does not use drugs.  Allergies:  Allergies  Allergen Reactions   Atorvastatin Other (See Comments)    Pt reports causes "muscle aches."   Codeine Itching    Mood changes, can take percocet   Ibuprofen Hives    Blisters    Lisinopril Cough   Aspirin Nausea Only    Patient able to take lower dose, Aspirin 81mg      (Not in a hospital admission)    Physical Exam: Blood pressure (!) 168/76, pulse (!) 107, temperature 98.8 F (37.1 C), temperature source Oral, resp. rate 20, SpO2 97 %. General: resting comfortably, appears stated age, no apparent distress Neurological: alert and oriented, no focal deficits, cranial  nerves grossly in tact HEENT: normocephalic, atraumatic, oropharynx clear, no scleral icterus CV: regular rate and rhythm, no murmurs, extremities warm and well-perfused Respiratory: normal work of breathing on room air, symmetric chest wall expansion Abdomen: soft, nondistended, nontender to deep palpation. Well-healed port site scars. LUQ JP drain with milky white drainage. Extremities: warm and well-perfused, no deformities, moving all extremities spontaneously Psychiatric: normal mood and affect Skin: warm and dry, no jaundice, no rashes  or lesions   Results for orders placed or performed during the hospital encounter of 10/13/20 (from the past 48 hour(s))  CBC with Differential/Platelet     Status: Abnormal   Collection Time: 10/13/20 11:40 AM  Result Value Ref Range   WBC 12.4 (H) 4.0 - 10.5 K/uL   RBC 4.04 3.87 - 5.11 MIL/uL   Hemoglobin 13.0 12.0 - 15.0 g/dL   HCT 39.8 36.0 - 46.0 %   MCV 98.5 80.0 - 100.0 fL   MCH 32.2 26.0 - 34.0 pg   MCHC 32.7 30.0 - 36.0 g/dL   RDW 12.4 11.5 - 15.5 %   Platelets 555 (H) 150 - 400 K/uL   nRBC 0.0 0.0 - 0.2 %   Neutrophils Relative % 72 %   Neutro Abs 8.9 (H) 1.7 - 7.7 K/uL   Lymphocytes Relative 17 %   Lymphs Abs 2.1 0.7 - 4.0 K/uL   Monocytes Relative 11 %   Monocytes Absolute 1.4 (H) 0.1 - 1.0 K/uL   Eosinophils Relative 0 %   Eosinophils Absolute 0.0 0.0 - 0.5 K/uL   Basophils Relative 0 %   Basophils Absolute 0.0 0.0 - 0.1 K/uL   Immature Granulocytes 0 %   Abs Immature Granulocytes 0.04 0.00 - 0.07 K/uL    Comment: Performed at [facility], [address], [place], [place] [address]  Comprehensive metabolic panel     Status: Abnormal   Collection Time: 10/13/20 11:40 AM  Result Value Ref Range   Sodium 131 (L) 135 - 145 mmol/L   Potassium 3.8 3.5 - 5.1 mmol/L   Chloride 97 (L) 98 - 111 mmol/L   CO2 25 22 - 32 mmol/L   Glucose, Bld 205 (H) 70 - 99 mg/dL    Comment: Glucose reference range applies only to samples  taken after fasting for at least 8 hours.   BUN 6 (L) 8 - 23 mg/dL   Creatinine, Ser 0.49 0.44 - 1.00 mg/dL   Calcium 9.0 8.9 - 10.3 mg/dL   Total Protein 6.7 6.5 - 8.1 g/dL   Albumin 2.9 (L) 3.5 - 5.0 g/dL   AST 24 15 - 41 U/L   ALT 14 0 - 44 U/L   Alkaline Phosphatase 90 38 - 126 U/L   Total Bilirubin 0.4 0.3 - 1.2 mg/dL   GFR, Estimated >60 >60 mL/min    Comment: (NOTE) Calculated using the CKD-EPI Creatinine Equation (2021)    Anion gap 9 5 - 15    Comment: Performed at [facility] [address], [place], [place] [address]  Lipase, blood     Status: None   Collection Time: 10/13/20 11:40 AM  Result Value Ref Range   Lipase 45 11 - 51 U/L    Comment: Performed at [facility] [address] [place],  [address]   CT ABDOMEN PELVIS W CONTRAST  Result Date: 10/13/2020 CLINICAL DATA:  Pancreatectomy, 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 Abdominal pain. EXAM: CT ABDOMEN AND PELVIS WITH CONTRAST TECHNIQUE: Multidetector CT imaging of the abdomen and pelvis was performed using the standard protocol following bolus administration of intravenous contrast. CONTRAST:  25mL OMNIPAQUE IOHEXOL 350 MG/ML SOLN COMPARISON:  CT chest abdomen and pelvis 09/24/2020. FINDINGS: Lower chest: There is a small left pleural effusion with minimal left basilar atelectasis, mildly decreased. Hepatobiliary: No focal liver abnormality is seen. No gallstones, gallbladder wall thickening, or biliary dilatation. Pancreas: Patient is status post distal pancreatectomy. There is minimal fluid and stranding  in surgical bed. Left-sided percutaneous drainage catheter is seen with distal tip in the surgical bed. This catheter is unchanged in position. Pancreatic head and body appear within normal limits. Spleen: Surgically absent. There is minimal stranding in the surgical bed. No fluid collection. Adrenals/Urinary Tract: The bilateral adrenal glands appear within normal limits. There is a stable 1 cm indeterminate  exophytic left renal lesion. There is no hydronephrosis or perinephric fat stranding. The bladder is within normal limits. Stomach/Bowel: There is no evidence for bowel obstruction or free intraperitoneal air. There is colonic diverticulosis without evidence for acute diverticulitis. The appendix is not visualized. Small bowel loops are within normal limits. The stomach is decompressed and within normal limits. Again noted is posterior gastric wall thickening and edema which approximate the pancreas with loss of fat plane, similar to the prior examination. There is questionable early developing fluid collection at this level image 4/19 measuring 1.3 x 1.3 cm. There is no free air in this region. Vascular/Lymphatic: Aortic atherosclerosis. No enlarged abdominal or pelvic lymph nodes. Reproductive: Status post hysterectomy. No adnexal masses. Other: Midline abdominal scarring is present. There is some intramuscular abdominal wall edema without focal fluid collection at the level of the percutaneous drainage catheter in the anterior left abdomen. Appearance is similar to the prior study. There are surgical clips in the right inguinal region and along the left inferior abdominal wall, unchanged. No abdominopelvic ascites. Musculoskeletal: Degenerative changes affect the spine and hips. IMPRESSION: 1. 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 Adhesion of pancreas and gastric wall is again noted with gastric wall edema. There is questionable new small developing fluid collection at this level. There is no extraluminal gas. Fistula or early abscess cannot be excluded. 2. Percutaneous drainage catheter is unchanged in position. 3. Small left pleural effusion has decreased. 4. Colonic diverticulosis without evidence for acute diverticulitis. 5.  Aortic Atherosclerosis (ICD10-I70.0). Electronically Signed   By: Ronney Asters M.D.   On: 10/13/2020 15:15      Assessment/Plan 80 yo female 6 weeks s/p  laparoscopic distal pancreatectomy with splenectomy, with a postoperative pancreatic fistula and failure to thrive.  I reviewed her CT scan, which shows that the end of her drain is still appropriately positioned next the remnant pancreas. There are no undrained fluid collections, and the stomach and small bowel appear decompressed. There are no other findings to explain her pain. Will plan to admit for IV fluid hydration and symptom management. She has no clinical signs of infection/sepsis.  - Regular diet, protein shakes, IVF at 75 ml/hr - Nausea: scheduled reglan - Home medications reordered - PT evaluation in am - VTE: SCDs, lovenox - Admit to observation   Michaelle Birks, MD [facility] Surgery General, Hepatobiliary and Pancreatic Surgery 10/13/20 6:13 PM

## 2020-10-13 NOTE — ED Provider Notes (Addendum)
Emergency Medicine Provider Triage Evaluation Note  Renee [name] , a 80 y.o. female  was evaluated in triage.  Pt complains of left-sided flank pain discomfort and foul smell she states that is been like this for several weeks.  Patient is 1 month s/p distal pancreatectomy and splenectomy she has had a persistent postoperative pancreatic fistula.  She has a wound VAC that is in place.  She states that she has pain in his area that is achy and constant.  She was seen by Dr. Michaelle Birks yesterday who ordered a CT scan abdomen pelvis with contrast that was scheduled for 5 PM today patient states that she does not feel that she can wait till that time and also does not feel that she can tolerate p.o. contrast.  Review of Systems  Positive: Abdominal pain, concern for wound infection Negative: Fever  Physical Exam  BP 134/65 (BP Location: Left Arm)   Pulse 97   Temp 99.2 F (37.3 C)   Resp 17   LMP  (LMP Unknown)   SpO2 95%  Gen:   Awake, no distress   Resp:  Normal effort  MSK:   Moves extremities without difficulty  Other:  Abdomen is soft not particularly tender there is a drain in her left abdomen with wound VAC that is with some whitish appearing fluid in it.  Medical Decision Making  Medically screening exam initiated at 11:32 AM.  Appropriate orders placed.  Renee [name] was informed that the remainder of the evaluation will be completed by another provider, this initial triage assessment does not replace that evaluation, and the importance of remaining in the ED until their evaluation is complete.  Did send Dr. Michaelle Birks a direct message on epic to update her that patient is in the ER  Will obtain imaging that was already ordered by Dr. Zenia Resides.   Tedd Sias, [place] 10/13/20 1134   Addendum to include discussion with Michaelle Birks, MD.  Dr. Zenia Resides has reviewed CT imaging and labs. I have messaged back-and-forth with her over direct message about patient's  symptoms.  She states that she requires admission that Dr. Zenia Resides will admit Disposition status at this time is uncertain.    Renee [name], [place] 10/13/20 1612    Elnora Morrison, MD 10/14/20 [phone]

## 2020-10-14 DIAGNOSIS — R627 Adult failure to thrive: Secondary | ICD-10-CM | POA: Diagnosis not present

## 2020-10-14 NOTE — ED Notes (Signed)
Breakfast Ordered 

## 2020-10-14 NOTE — Discharge Summary (Signed)
Physician Discharge Summary  Patient ID: Renee [name] MRN: [id number] DOB/AGE: 1940/06/30 80 y.o.  Admit date: 10/13/2020 Discharge date: 10/14/2020  Admission Diagnoses: PO intolerance Failure to thrive  Discharge Diagnoses:  Active Problems:   Failure to thrive in adult   Discharged Condition: stable  Hospital Course: Renee [name] is an 80 yo female who underwent a laparoscopic distal pancreatectomy and splenectomy on 08/30/2020 for an IPMN in the tail of the pancreas with high-grade dysplasia.  Her course has been complicated by postoperative pancreatic fistula, which is controlled with her surgical drain.  She was seen in clinic the day prior to admission for drain check at which time she was having 20 mL/day of drainage.  At that time she also endorsed continued frequent nausea with poor p.o. intake.  She was sent for outpatient labs and a CT scan.  Labs were unremarkable with a normal WBC and normal prealbumin.  Before her CT scan was done she presented to the ED on 9/14 with weakness and left upper quadrant abdominal pain at her drain site.  The CT scan showed no undrained intra-abdominal fluid collections or abscesses, and no other source for her symptoms.  She was admitted for symptom management and fluid hydration.  She was given IV fluids and Reglan for nausea.  The morning after admission she was able to tolerate breakfast with no vomiting.  She worked with physical therapy and was able to ambulate without difficulty and was cleared for discharge with no further PT needs.  Her symptoms had significantly improved with pain and nausea control and she was deemed appropriate for discharge home.  Consults: None  Significant Diagnostic Studies: radiology: CT scan: no intraabdominal abscess or fluid collection, decreased size of left pleural effusion  Treatments: IV hydration, analgesia: acetaminophen, and therapies: PT  Discharge Exam: Blood pressure (!) 145/80, pulse 95, temperature  98 F (36.7 C), temperature source Oral, resp. rate 15, height 5\' 4"  (1.626 m), weight 71.2 kg, SpO2 99 %. General: resting comfortably, NAD Neuro: alert and oriented, no focal deficits Resp: normal work of breathing on room air CV: RRR Abdomen: soft, nondistended, nontender to palpation. Incisions well-healed with no erythema, induration or drainage. LUQ JP with milky white drainage. Extremities: warm and well-perfused   Disposition: Discharge disposition: 01-Home or Self Care       Discharge Instructions     Call MD for:  persistant nausea and vomiting   Complete by: As directed    Call MD for:  redness, tenderness, or signs of infection (pain, swelling, redness, odor or green/yellow discharge around incision site)   Complete by: As directed    Call MD for:  severe uncontrolled pain   Complete by: As directed    Call MD for:  temperature >100.4   Complete by: As directed    Diet general   Complete by: As directed    Increase activity slowly   Complete by: As directed       Allergies as of 10/14/2020       Reactions   Alendronate Sodium    Other reaction(s): worse reflux   Atorvastatin Other (See Comments)   Pt reports causes "muscle aches."   Buspirone Hcl    Other reaction(s): headache   Codeine Itching   Mood changes, can take percocet   Ezetimibe    Other reaction(s): myalgias   Ibuprofen Hives   Blisters    Lisinopril Cough   Temazepam    Other reaction(s): daytime drowsiness   Tramadol  Hcl    Other reaction(s): dizzy   Trazodone Hcl    Other reaction(s): nightmares   Valdecoxib    Other reaction(s): stomach pain   Aspirin Nausea Only   Patient able to take lower dose, Aspirin 81mg          Medication List     TAKE these medications    acetaminophen 325 MG tablet Commonly known as: TYLENOL Take 2 tablets (650 mg total) by mouth every 6 (six) hours as needed for mild pain or headache.   aspirin EC 81 MG tablet Take 1 tablet (81 mg total) by  mouth daily.   CALCIUM 1200 PO Take 1,200 mg by mouth daily.   docusate sodium 100 MG capsule Commonly known as: COLACE Take 1 capsule (100 mg total) by mouth 2 (two) times daily as needed for mild constipation.   esomeprazole 40 MG capsule Commonly known as: NexIUM Take 1 capsule (40 mg total) by mouth daily.   feeding supplement Liqd Take 237 mLs by mouth 2 (two) times daily between meals.   HYDROcodone-acetaminophen 5-325 MG tablet Commonly known as: NORCO/VICODIN Take 1 tablet by mouth every 6 (six) hours as needed for severe pain.   metoCLOPramide 5 MG tablet Commonly known as: Reglan Take 1 tablet (5 mg total) by mouth every 8 (eight) hours as needed for nausea or vomiting.   metoprolol tartrate 50 MG tablet Commonly known as: LOPRESSOR TAKE 1 TABLET(50 MG) BY MOUTH TWICE DAILY What changed: See the new instructions.   multivitamin tablet Take 1 tablet by mouth daily.   polyethylene glycol 17 g packet Commonly known as: MIRALAX / GLYCOLAX Take 17 g by mouth daily as needed for mild constipation.   Prolia 60 MG/ML Sosy injection Generic drug: denosumab Inject 60 mg into the skin every 6 (six) months.   Repatha 140 MG/ML Sosy Generic drug: Evolocumab Inject 140 mg into the skin every 14 (fourteen) days.   saccharomyces boulardii 250 MG capsule Commonly known as: FLORASTOR Take 1 capsule (250 mg total) by mouth 2 (two) times daily.   VITAMIN D PO Take 1,000 Units by mouth daily.         Signed: Dwan Bolt 10/14/2020, 12:44 PM

## 2020-10-14 NOTE — Evaluation (Signed)
Physical Therapy Evaluation Patient Details Name: Renee [name] MRN: [id number] DOB: [birth year] Today's Date: 10/14/2020  History of Present Illness  Pt presented 9/14 wth nausea, weakness and FTT. 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 PMH - DM, DVT, arthritis, breast CA, HTN, Bil TKR, copd  Clinical Impression  Pt doing well with mobility and no further acute PT needed.  Ready for dc from PT standpoint.        Recommendations for follow up therapy are one component of a multi-disciplinary discharge planning process, led by the attending physician.  Recommendations may be updated based on patient status, additional functional criteria and insurance authorization.  Follow Up Recommendations Home health PT (resume HHPT)    Equipment Recommendations  None recommended by PT    Recommendations for Other Services       Precautions / Restrictions Precautions Precautions: None      Mobility  Bed Mobility Overal bed mobility: Modified Independent Bed Mobility: Supine to Sit     Supine to sit: Modified independent (Device/Increase time);HOB elevated          Transfers Overall transfer level: Independent Equipment used: None Transfers: Sit to/from Stand Sit to Stand: Independent         General transfer comment: Steady to rise without difficulty  Ambulation/Gait Ambulation/Gait assistance: Modified independent (Device/Increase time) Gait Distance (Feet): 300 Feet Assistive device: None Gait Pattern/deviations: Decreased stride length Gait velocity: adedquate Gait velocity interpretation: >2.62 ft/sec, indicative of community ambulatory General Gait Details: Steady gait  Stairs            Wheelchair Mobility    Modified Rankin (Stroke Patients Only)       Balance Overall balance assessment: Mild deficits observed, not formally tested                                            Pertinent Vitals/Pain Pain Assessment: 0-10 Pain Score: 5  Pain Location: chronic back pain Pain Descriptors / Indicators: Sore Pain Intervention(s): Limited activity within patient's tolerance    Home Living Family/patient expects to be discharged to:: Private residence Living Arrangements: Spouse/significant other Available Help at Discharge: Family;Available PRN/intermittently Type of Home: House Home Access: Ramped entrance     Home Layout: One level Home Equipment: Walker - 2 wheels;Cane - single point;Bedside commode;Shower seat      Prior Function Level of Independence: Independent with assistive device(s)         Comments: Uses straight cane     Hand Dominance   Dominant Hand: Right    Extremity/Trunk Assessment                Communication   Communication: No difficulties  Cognition Arousal/Alertness: Awake/alert Behavior During Therapy: WFL for tasks assessed/performed Overall Cognitive Status: Within Functional Limits for tasks assessed                                        General Comments General comments (skin integrity, edema, etc.): Dyspnea 2/4 at end of amb    Exercises     Assessment/Plan    PT Assessment All further PT needs can be met in the next venue of care  PT Problem List Decreased activity tolerance  PT Treatment Interventions      PT Goals (Current goals can be found in the Care Plan section)  Acute Rehab PT Goals PT Goal Formulation: All assessment and education complete, DC therapy    Frequency     Barriers to discharge        Co-evaluation               AM-PAC PT "6 Clicks" Mobility  Outcome Measure Help needed turning from your back to your side while in a flat bed without using bedrails?: None Help needed moving from lying on your back to sitting on the side of a flat bed without using bedrails?: None Help needed moving to and from a bed to a chair (including a  wheelchair)?: None Help needed standing up from a chair using your arms (e.g., wheelchair or bedside chair)?: None Help needed to walk in hospital room?: None Help needed climbing 3-5 steps with a railing? : None 6 Click Score: 24    End of Session   Activity Tolerance: Patient tolerated treatment well Patient left: in chair;with call bell/phone within reach Nurse Communication: Mobility status PT Visit Diagnosis: Adult, failure to thrive (R62.7)    Time: 1438-8875 PT Time Calculation (min) (ACUTE ONLY): 12 min   Charges:   PT Evaluation $PT Eval Low Complexity: 1 Low          [place] Pager [phone] Office [place] 10/14/2020, 10:59 AM

## 2020-10-14 NOTE — ED Notes (Signed)
Pt verbalized understanding of d/c instructions, meds and followup care. Denies questions. VSS, no distress noted. W/C to exit to husband's vehicle.

## 2020-10-14 NOTE — ED Notes (Addendum)
Pt tolerated PO at breakfast with no complications. Pt and husband asking insistently to be discharged. RN messaged Dr Zenia Resides to determine if this was feasible, as note says d/c tonight vs tomorrow. MD agreeable, d/c is appropriate, Dr Zenia Resides to place order.

## 2020-10-14 NOTE — Discharge Instructions (Signed)
[facility] DISCHARGE INSTRUCTIONS  [facility] DRAIN CARE INSTRUCTIONS Wash your hands prior to caring for your drain. Uncap the bulb to release the suction. Pour out the bulb contents into a measuring cup and record the amount. Squeeze the bulb and replace the cap to apply suction again. You should empty your drain each time you notice the bulb is full. Empty at least once a day and more often when the bulb fills up.  Please keep a daily log of your drain output and bring this with you when you come to clinic.    When to Call us: Fever greater than 100.5 New redness, drainage, or swelling at incision site Severe pain, nausea, or vomiting Jaundice (yellowing of the whites of the eyes or skin)   For questions or concerns, please call the office at [phone].

## 2020-10-14 NOTE — Progress Notes (Signed)
Subjective: No acute events overnight. Patient endorses continued nausea but no vomiting. Has not tried to eat anything since admission. Afebrile, vitals stable. She emptied drain herself last night and estimates there was 40 ml over 2 days.   Objective: Vital signs in last 24 hours: Temp:  [98.4 F (36.9 C)-99.2 F (37.3 C)] 98.4 F (36.9 C) (09/15 0438) Pulse Rate:  [84-107] 95 (09/15 0438) Resp:  [14-31] 30 (09/15 0438) BP: (123-168)/(53-76) 135/71 (09/15 0438) SpO2:  [95 %-98 %] 97 % (09/15 0438) Weight:  [71.2 kg] 71.2 kg (09/14 1945)    Intake/Output from previous day: No intake/output data recorded. Intake/Output this shift: No intake/output data recorded.  PE: General: resting comfortably, NAD Neuro: alert and oriented, no focal deficits Resp: normal work of breathing on room air Abdomen: soft, nondistended, nontender to palpation. Incisions healing well. LUQ JP with scant milky white drainage. Extremities: warm and well-perfused   Lab Results:  Recent Labs    10/13/20 1140  WBC 12.4*  HGB 13.0  HCT 39.8  PLT 555*   BMET Recent Labs    10/13/20 1140  NA 131*  K 3.8  CL 97*  CO2 25  GLUCOSE 205*  BUN 6*  CREATININE 0.49  CALCIUM 9.0   PT/INR No results for input(s): LABPROT, INR in the last 72 hours. CMP     Component Value Date/Time   NA 131 (L) 10/13/2020 1140   NA 138 10/29/2018 1006   K 3.8 10/13/2020 1140   CL 97 (L) 10/13/2020 1140   CO2 25 10/13/2020 1140   GLUCOSE 205 (H) 10/13/2020 1140   BUN 6 (L) 10/13/2020 1140   BUN 12 10/29/2018 1006   CREATININE 0.49 10/13/2020 1140   CREATININE 0.67 11/16/2014 0846   CALCIUM 9.0 10/13/2020 1140   PROT 6.7 10/13/2020 1140   PROT 6.6 02/03/2019 1017   ALBUMIN 2.9 (L) 10/13/2020 1140   ALBUMIN 4.3 02/03/2019 1017   AST 24 10/13/2020 1140   ALT 14 10/13/2020 1140   ALKPHOS 90 10/13/2020 1140   BILITOT 0.4 10/13/2020 1140   BILITOT 0.5 02/03/2019 1017   GFRNONAA >60 10/13/2020  1140   GFRAA >60 04/11/2019 0924   Lipase     Component Value Date/Time   LIPASE 45 10/13/2020 1140       Studies/Results: CT ABDOMEN PELVIS W CONTRAST  Result Date: 10/13/2020 CLINICAL DATA:  Pancreatectomy, 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 Abdominal pain. EXAM: CT ABDOMEN AND PELVIS WITH CONTRAST TECHNIQUE: Multidetector CT imaging of the abdomen and pelvis was performed using the standard protocol following bolus administration of intravenous contrast. CONTRAST:  76mL OMNIPAQUE IOHEXOL 350 MG/ML SOLN COMPARISON:  CT chest abdomen and pelvis 09/24/2020. FINDINGS: Lower chest: There is a small left pleural effusion with minimal left basilar atelectasis, mildly decreased. Hepatobiliary: No focal liver abnormality is seen. No gallstones, gallbladder wall thickening, or biliary dilatation. Pancreas: Patient is status post distal pancreatectomy. There is minimal fluid and stranding in surgical bed. Left-sided percutaneous drainage catheter is seen with distal tip in the surgical bed. This catheter is unchanged in position. Pancreatic head and body appear within normal limits. Spleen: Surgically absent. There is minimal stranding in the surgical bed. No fluid collection. Adrenals/Urinary Tract: The bilateral adrenal glands appear within normal limits. There is a stable 1 cm indeterminate exophytic left renal lesion. There is no hydronephrosis or perinephric fat stranding. The bladder is within normal limits. Stomach/Bowel: There is no evidence for bowel obstruction or free intraperitoneal air.  There is colonic diverticulosis without evidence for acute diverticulitis. The appendix is not visualized. Small bowel loops are within normal limits. The stomach is decompressed and within normal limits. Again noted is posterior gastric wall thickening and edema which approximate the pancreas with loss of fat plane, similar to the prior examination. There is questionable early developing fluid collection at this level  image 4/19 measuring 1.3 x 1.3 cm. There is no free air in this region. Vascular/Lymphatic: Aortic atherosclerosis. No enlarged abdominal or pelvic lymph nodes. Reproductive: Status post hysterectomy. No adnexal masses. Other: Midline abdominal scarring is present. There is some intramuscular abdominal wall edema without focal fluid collection at the level of the percutaneous drainage catheter in the anterior left abdomen. Appearance is similar to the prior study. There are surgical clips in the right inguinal region and along the left inferior abdominal wall, unchanged. No abdominopelvic ascites. Musculoskeletal: Degenerative changes affect the spine and hips. IMPRESSION: 1. 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 Adhesion of pancreas and gastric wall is again noted with gastric wall edema. There is questionable new small developing fluid collection at this level. There is no extraluminal gas. Fistula or early abscess cannot be excluded. 2. Percutaneous drainage catheter is unchanged in position. 3. Small left pleural effusion has decreased. 4. Colonic diverticulosis without evidence for acute diverticulitis. 5.  Aortic Atherosclerosis (ICD10-I70.0). Electronically Signed   By: Ronney Asters M.D.   On: 10/13/2020 15:15    Anti-infectives: Anti-infectives (From admission, onward)    None        Assessment/Plan  80 yo female 6 weeks s/p lap distal pancreatectomy and splenectomy with postoperative pancreatic fistula, controlled with drain. No evidence of intraabdominal fluid collection or abscess on imaging. Admitted with failure to thrive. - Regular diet, protein shakes - SLIV - Reglan for nausea - VTE: lovenox, SCDs - Dispo: observation, plan for discharge home this evening vs tomorrow morning if patient is able to tolerate PO today     LOS: 0 days    Michaelle Birks, [facility], Hepatobiliary and Pancreatic Surgery 10/14/20 8:21 AM

## 2020-10-18 DIAGNOSIS — E871 Hypo-osmolality and hyponatremia: Secondary | ICD-10-CM | POA: Diagnosis not present

## 2020-10-18 DIAGNOSIS — E8809 Other disorders of plasma-protein metabolism, not elsewhere classified: Secondary | ICD-10-CM | POA: Diagnosis not present

## 2020-10-18 DIAGNOSIS — T8141XD Infection following a procedure, superficial incisional surgical site, subsequent encounter: Secondary | ICD-10-CM | POA: Diagnosis not present

## 2020-10-18 DIAGNOSIS — Z7982 Long term (current) use of aspirin: Secondary | ICD-10-CM | POA: Diagnosis not present

## 2020-10-18 DIAGNOSIS — K5730 Diverticulosis of large intestine without perforation or abscess without bleeding: Secondary | ICD-10-CM | POA: Diagnosis not present

## 2020-10-18 DIAGNOSIS — N390 Urinary tract infection, site not specified: Secondary | ICD-10-CM | POA: Diagnosis not present

## 2020-10-18 DIAGNOSIS — D696 Thrombocytopenia, unspecified: Secondary | ICD-10-CM | POA: Diagnosis not present

## 2020-10-18 DIAGNOSIS — B9561 Methicillin susceptible Staphylococcus aureus infection as the cause of diseases classified elsewhere: Secondary | ICD-10-CM | POA: Diagnosis not present

## 2020-10-18 DIAGNOSIS — E1165 Type 2 diabetes mellitus with hyperglycemia: Secondary | ICD-10-CM | POA: Diagnosis not present

## 2020-10-18 DIAGNOSIS — A419 Sepsis, unspecified organism: Secondary | ICD-10-CM | POA: Diagnosis not present

## 2020-10-18 DIAGNOSIS — I700 Atherosclerosis of aorta: Secondary | ICD-10-CM | POA: Diagnosis not present

## 2020-10-18 DIAGNOSIS — E876 Hypokalemia: Secondary | ICD-10-CM | POA: Diagnosis not present

## 2020-10-18 DIAGNOSIS — E440 Moderate protein-calorie malnutrition: Secondary | ICD-10-CM | POA: Diagnosis not present

## 2020-10-18 DIAGNOSIS — J440 Chronic obstructive pulmonary disease with acute lower respiratory infection: Secondary | ICD-10-CM | POA: Diagnosis not present

## 2020-10-18 DIAGNOSIS — K219 Gastro-esophageal reflux disease without esophagitis: Secondary | ICD-10-CM | POA: Diagnosis not present

## 2020-10-18 DIAGNOSIS — Z9181 History of falling: Secondary | ICD-10-CM | POA: Diagnosis not present

## 2020-10-18 DIAGNOSIS — Z96653 Presence of artificial knee joint, bilateral: Secondary | ICD-10-CM | POA: Diagnosis not present

## 2020-10-18 DIAGNOSIS — J9811 Atelectasis: Secondary | ICD-10-CM | POA: Diagnosis not present

## 2020-10-18 DIAGNOSIS — T8144XD Sepsis following a procedure, subsequent encounter: Secondary | ICD-10-CM | POA: Diagnosis not present

## 2020-10-18 DIAGNOSIS — D649 Anemia, unspecified: Secondary | ICD-10-CM | POA: Diagnosis not present

## 2020-10-18 DIAGNOSIS — B965 Pseudomonas (aeruginosa) (mallei) (pseudomallei) as the cause of diseases classified elsewhere: Secondary | ICD-10-CM | POA: Diagnosis not present

## 2020-10-18 DIAGNOSIS — E785 Hyperlipidemia, unspecified: Secondary | ICD-10-CM | POA: Diagnosis not present

## 2020-10-19 DIAGNOSIS — E876 Hypokalemia: Secondary | ICD-10-CM | POA: Diagnosis not present

## 2020-10-19 DIAGNOSIS — Z7982 Long term (current) use of aspirin: Secondary | ICD-10-CM | POA: Diagnosis not present

## 2020-10-19 DIAGNOSIS — T8144XD Sepsis following a procedure, subsequent encounter: Secondary | ICD-10-CM | POA: Diagnosis not present

## 2020-10-19 DIAGNOSIS — J440 Chronic obstructive pulmonary disease with acute lower respiratory infection: Secondary | ICD-10-CM | POA: Diagnosis not present

## 2020-10-19 DIAGNOSIS — Z96653 Presence of artificial knee joint, bilateral: Secondary | ICD-10-CM | POA: Diagnosis not present

## 2020-10-19 DIAGNOSIS — K5730 Diverticulosis of large intestine without perforation or abscess without bleeding: Secondary | ICD-10-CM | POA: Diagnosis not present

## 2020-10-19 DIAGNOSIS — B965 Pseudomonas (aeruginosa) (mallei) (pseudomallei) as the cause of diseases classified elsewhere: Secondary | ICD-10-CM | POA: Diagnosis not present

## 2020-10-19 DIAGNOSIS — D696 Thrombocytopenia, unspecified: Secondary | ICD-10-CM | POA: Diagnosis not present

## 2020-10-19 DIAGNOSIS — E8809 Other disorders of plasma-protein metabolism, not elsewhere classified: Secondary | ICD-10-CM | POA: Diagnosis not present

## 2020-10-19 DIAGNOSIS — D649 Anemia, unspecified: Secondary | ICD-10-CM | POA: Diagnosis not present

## 2020-10-19 DIAGNOSIS — B9561 Methicillin susceptible Staphylococcus aureus infection as the cause of diseases classified elsewhere: Secondary | ICD-10-CM | POA: Diagnosis not present

## 2020-10-19 DIAGNOSIS — N390 Urinary tract infection, site not specified: Secondary | ICD-10-CM | POA: Diagnosis not present

## 2020-10-19 DIAGNOSIS — I700 Atherosclerosis of aorta: Secondary | ICD-10-CM | POA: Diagnosis not present

## 2020-10-19 DIAGNOSIS — A419 Sepsis, unspecified organism: Secondary | ICD-10-CM | POA: Diagnosis not present

## 2020-10-19 DIAGNOSIS — T8141XD Infection following a procedure, superficial incisional surgical site, subsequent encounter: Secondary | ICD-10-CM | POA: Diagnosis not present

## 2020-10-19 DIAGNOSIS — E871 Hypo-osmolality and hyponatremia: Secondary | ICD-10-CM | POA: Diagnosis not present

## 2020-10-19 DIAGNOSIS — J9811 Atelectasis: Secondary | ICD-10-CM | POA: Diagnosis not present

## 2020-10-19 DIAGNOSIS — Z9181 History of falling: Secondary | ICD-10-CM | POA: Diagnosis not present

## 2020-10-19 DIAGNOSIS — E785 Hyperlipidemia, unspecified: Secondary | ICD-10-CM | POA: Diagnosis not present

## 2020-10-19 DIAGNOSIS — K219 Gastro-esophageal reflux disease without esophagitis: Secondary | ICD-10-CM | POA: Diagnosis not present

## 2020-10-19 DIAGNOSIS — E440 Moderate protein-calorie malnutrition: Secondary | ICD-10-CM | POA: Diagnosis not present

## 2020-10-19 DIAGNOSIS — E1165 Type 2 diabetes mellitus with hyperglycemia: Secondary | ICD-10-CM | POA: Diagnosis not present

## 2020-10-20 DIAGNOSIS — E441 Mild protein-calorie malnutrition: Secondary | ICD-10-CM | POA: Diagnosis not present

## 2020-10-20 DIAGNOSIS — Q8901 Asplenia (congenital): Secondary | ICD-10-CM | POA: Diagnosis not present

## 2020-10-20 DIAGNOSIS — Z90411 Acquired partial absence of pancreas: Secondary | ICD-10-CM | POA: Diagnosis not present

## 2020-10-20 DIAGNOSIS — Z23 Encounter for immunization: Secondary | ICD-10-CM | POA: Diagnosis not present

## 2020-10-21 DIAGNOSIS — D696 Thrombocytopenia, unspecified: Secondary | ICD-10-CM | POA: Diagnosis not present

## 2020-10-21 DIAGNOSIS — E785 Hyperlipidemia, unspecified: Secondary | ICD-10-CM | POA: Diagnosis not present

## 2020-10-21 DIAGNOSIS — K5730 Diverticulosis of large intestine without perforation or abscess without bleeding: Secondary | ICD-10-CM | POA: Diagnosis not present

## 2020-10-21 DIAGNOSIS — Z96653 Presence of artificial knee joint, bilateral: Secondary | ICD-10-CM | POA: Diagnosis not present

## 2020-10-21 DIAGNOSIS — E440 Moderate protein-calorie malnutrition: Secondary | ICD-10-CM | POA: Diagnosis not present

## 2020-10-21 DIAGNOSIS — K219 Gastro-esophageal reflux disease without esophagitis: Secondary | ICD-10-CM | POA: Diagnosis not present

## 2020-10-21 DIAGNOSIS — J440 Chronic obstructive pulmonary disease with acute lower respiratory infection: Secondary | ICD-10-CM | POA: Diagnosis not present

## 2020-10-21 DIAGNOSIS — Z9181 History of falling: Secondary | ICD-10-CM | POA: Diagnosis not present

## 2020-10-21 DIAGNOSIS — E1165 Type 2 diabetes mellitus with hyperglycemia: Secondary | ICD-10-CM | POA: Diagnosis not present

## 2020-10-21 DIAGNOSIS — B965 Pseudomonas (aeruginosa) (mallei) (pseudomallei) as the cause of diseases classified elsewhere: Secondary | ICD-10-CM | POA: Diagnosis not present

## 2020-10-21 DIAGNOSIS — A419 Sepsis, unspecified organism: Secondary | ICD-10-CM | POA: Diagnosis not present

## 2020-10-21 DIAGNOSIS — J9811 Atelectasis: Secondary | ICD-10-CM | POA: Diagnosis not present

## 2020-10-21 DIAGNOSIS — N390 Urinary tract infection, site not specified: Secondary | ICD-10-CM | POA: Diagnosis not present

## 2020-10-21 DIAGNOSIS — I700 Atherosclerosis of aorta: Secondary | ICD-10-CM | POA: Diagnosis not present

## 2020-10-21 DIAGNOSIS — E876 Hypokalemia: Secondary | ICD-10-CM | POA: Diagnosis not present

## 2020-10-21 DIAGNOSIS — T8144XD Sepsis following a procedure, subsequent encounter: Secondary | ICD-10-CM | POA: Diagnosis not present

## 2020-10-21 DIAGNOSIS — T8141XD Infection following a procedure, superficial incisional surgical site, subsequent encounter: Secondary | ICD-10-CM | POA: Diagnosis not present

## 2020-10-21 DIAGNOSIS — E8809 Other disorders of plasma-protein metabolism, not elsewhere classified: Secondary | ICD-10-CM | POA: Diagnosis not present

## 2020-10-21 DIAGNOSIS — B9561 Methicillin susceptible Staphylococcus aureus infection as the cause of diseases classified elsewhere: Secondary | ICD-10-CM | POA: Diagnosis not present

## 2020-10-21 DIAGNOSIS — E871 Hypo-osmolality and hyponatremia: Secondary | ICD-10-CM | POA: Diagnosis not present

## 2020-10-21 DIAGNOSIS — Z7982 Long term (current) use of aspirin: Secondary | ICD-10-CM | POA: Diagnosis not present

## 2020-10-21 DIAGNOSIS — D649 Anemia, unspecified: Secondary | ICD-10-CM | POA: Diagnosis not present

## 2020-10-25 ENCOUNTER — Telehealth: Payer: Self-pay | Admitting: Cardiovascular Disease

## 2020-11-02 ENCOUNTER — Telehealth: Payer: Self-pay | Admitting: Internal Medicine

## 2020-11-02 NOTE — Telephone Encounter (Signed)
Spoke with pt and she states she had surgery on her abdomen and she has continued to have abd pain. States the surgeons office tells her the surgery was 3 mths ago and she should not still be having pain. Pt requesting to be seen. Pt scheduled to see Alonza Bogus PA 11/09/20 at 9am. Pt aware of appt.

## 2020-11-02 NOTE — Telephone Encounter (Signed)
Patient called is having a lot of abdominal pain seeking advise.

## 2020-11-03 ENCOUNTER — Other Ambulatory Visit (HOSPITAL_COMMUNITY): Payer: Self-pay

## 2020-11-04 ENCOUNTER — Other Ambulatory Visit (HOSPITAL_COMMUNITY): Payer: Self-pay

## 2020-11-04 DIAGNOSIS — K219 Gastro-esophageal reflux disease without esophagitis: Secondary | ICD-10-CM | POA: Diagnosis not present

## 2020-11-04 DIAGNOSIS — Z7982 Long term (current) use of aspirin: Secondary | ICD-10-CM | POA: Diagnosis not present

## 2020-11-04 DIAGNOSIS — D649 Anemia, unspecified: Secondary | ICD-10-CM | POA: Diagnosis not present

## 2020-11-04 DIAGNOSIS — E440 Moderate protein-calorie malnutrition: Secondary | ICD-10-CM | POA: Diagnosis not present

## 2020-11-04 DIAGNOSIS — J9811 Atelectasis: Secondary | ICD-10-CM | POA: Diagnosis not present

## 2020-11-04 DIAGNOSIS — K5730 Diverticulosis of large intestine without perforation or abscess without bleeding: Secondary | ICD-10-CM | POA: Diagnosis not present

## 2020-11-04 DIAGNOSIS — E1165 Type 2 diabetes mellitus with hyperglycemia: Secondary | ICD-10-CM | POA: Diagnosis not present

## 2020-11-04 DIAGNOSIS — T8141XD Infection following a procedure, superficial incisional surgical site, subsequent encounter: Secondary | ICD-10-CM | POA: Diagnosis not present

## 2020-11-04 DIAGNOSIS — B9561 Methicillin susceptible Staphylococcus aureus infection as the cause of diseases classified elsewhere: Secondary | ICD-10-CM | POA: Diagnosis not present

## 2020-11-04 DIAGNOSIS — N390 Urinary tract infection, site not specified: Secondary | ICD-10-CM | POA: Diagnosis not present

## 2020-11-04 DIAGNOSIS — Z96653 Presence of artificial knee joint, bilateral: Secondary | ICD-10-CM | POA: Diagnosis not present

## 2020-11-04 DIAGNOSIS — J440 Chronic obstructive pulmonary disease with acute lower respiratory infection: Secondary | ICD-10-CM | POA: Diagnosis not present

## 2020-11-04 DIAGNOSIS — E871 Hypo-osmolality and hyponatremia: Secondary | ICD-10-CM | POA: Diagnosis not present

## 2020-11-04 DIAGNOSIS — I700 Atherosclerosis of aorta: Secondary | ICD-10-CM | POA: Diagnosis not present

## 2020-11-04 DIAGNOSIS — D696 Thrombocytopenia, unspecified: Secondary | ICD-10-CM | POA: Diagnosis not present

## 2020-11-04 DIAGNOSIS — E876 Hypokalemia: Secondary | ICD-10-CM | POA: Diagnosis not present

## 2020-11-04 DIAGNOSIS — E785 Hyperlipidemia, unspecified: Secondary | ICD-10-CM | POA: Diagnosis not present

## 2020-11-04 DIAGNOSIS — E8809 Other disorders of plasma-protein metabolism, not elsewhere classified: Secondary | ICD-10-CM | POA: Diagnosis not present

## 2020-11-04 DIAGNOSIS — T8144XD Sepsis following a procedure, subsequent encounter: Secondary | ICD-10-CM | POA: Diagnosis not present

## 2020-11-04 DIAGNOSIS — Z9181 History of falling: Secondary | ICD-10-CM | POA: Diagnosis not present

## 2020-11-04 DIAGNOSIS — A419 Sepsis, unspecified organism: Secondary | ICD-10-CM | POA: Diagnosis not present

## 2020-11-04 DIAGNOSIS — B965 Pseudomonas (aeruginosa) (mallei) (pseudomallei) as the cause of diseases classified elsewhere: Secondary | ICD-10-CM | POA: Diagnosis not present

## 2020-11-04 MED ORDER — MENVEO IM SOLR
INTRAMUSCULAR | 0 refills | Status: DC
  Filled 2020-11-04 – 2020-12-02 (×2): qty 0.5, 1d supply, fill #0

## 2020-11-04 MED ORDER — HIBERIX 10 MCG IJ SOLR
INTRAMUSCULAR | 0 refills | Status: DC
  Filled 2020-11-04 – 2020-11-05 (×2): qty 1, 1d supply, fill #0
  Filled 2020-11-05: qty 1, 30d supply, fill #0

## 2020-11-04 MED ORDER — PREVNAR 20 0.5 ML IM SUSY
PREFILLED_SYRINGE | INTRAMUSCULAR | 0 refills | Status: DC
  Filled 2020-11-04: qty 0.5, 1d supply, fill #0

## 2020-11-04 MED ORDER — BEXSERO 0.5 ML IM SUSY
PREFILLED_SYRINGE | INTRAMUSCULAR | 1 refills | Status: DC
  Filled 2020-11-04 (×2): qty 0.5, 1d supply, fill #0
  Filled 2020-12-02: qty 0.5, 1d supply, fill #1

## 2020-11-05 ENCOUNTER — Other Ambulatory Visit (HOSPITAL_COMMUNITY): Payer: Self-pay

## 2020-11-07 DIAGNOSIS — T8141XD Infection following a procedure, superficial incisional surgical site, subsequent encounter: Secondary | ICD-10-CM | POA: Diagnosis not present

## 2020-11-07 DIAGNOSIS — D696 Thrombocytopenia, unspecified: Secondary | ICD-10-CM | POA: Diagnosis not present

## 2020-11-07 DIAGNOSIS — B9561 Methicillin susceptible Staphylococcus aureus infection as the cause of diseases classified elsewhere: Secondary | ICD-10-CM | POA: Diagnosis not present

## 2020-11-07 DIAGNOSIS — K219 Gastro-esophageal reflux disease without esophagitis: Secondary | ICD-10-CM | POA: Diagnosis not present

## 2020-11-07 DIAGNOSIS — J440 Chronic obstructive pulmonary disease with acute lower respiratory infection: Secondary | ICD-10-CM | POA: Diagnosis not present

## 2020-11-07 DIAGNOSIS — K5730 Diverticulosis of large intestine without perforation or abscess without bleeding: Secondary | ICD-10-CM | POA: Diagnosis not present

## 2020-11-07 DIAGNOSIS — D649 Anemia, unspecified: Secondary | ICD-10-CM | POA: Diagnosis not present

## 2020-11-07 DIAGNOSIS — E876 Hypokalemia: Secondary | ICD-10-CM | POA: Diagnosis not present

## 2020-11-07 DIAGNOSIS — E872 Acidosis, unspecified: Secondary | ICD-10-CM | POA: Diagnosis not present

## 2020-11-07 DIAGNOSIS — E785 Hyperlipidemia, unspecified: Secondary | ICD-10-CM | POA: Diagnosis not present

## 2020-11-07 DIAGNOSIS — A419 Sepsis, unspecified organism: Secondary | ICD-10-CM | POA: Diagnosis not present

## 2020-11-07 DIAGNOSIS — Z96653 Presence of artificial knee joint, bilateral: Secondary | ICD-10-CM | POA: Diagnosis not present

## 2020-11-07 DIAGNOSIS — E871 Hypo-osmolality and hyponatremia: Secondary | ICD-10-CM | POA: Diagnosis not present

## 2020-11-07 DIAGNOSIS — T8144XD Sepsis following a procedure, subsequent encounter: Secondary | ICD-10-CM | POA: Diagnosis not present

## 2020-11-07 DIAGNOSIS — E440 Moderate protein-calorie malnutrition: Secondary | ICD-10-CM | POA: Diagnosis not present

## 2020-11-07 DIAGNOSIS — Z9181 History of falling: Secondary | ICD-10-CM | POA: Diagnosis not present

## 2020-11-07 DIAGNOSIS — N390 Urinary tract infection, site not specified: Secondary | ICD-10-CM | POA: Diagnosis not present

## 2020-11-07 DIAGNOSIS — E8809 Other disorders of plasma-protein metabolism, not elsewhere classified: Secondary | ICD-10-CM | POA: Diagnosis not present

## 2020-11-07 DIAGNOSIS — J9811 Atelectasis: Secondary | ICD-10-CM | POA: Diagnosis not present

## 2020-11-07 DIAGNOSIS — E1165 Type 2 diabetes mellitus with hyperglycemia: Secondary | ICD-10-CM | POA: Diagnosis not present

## 2020-11-07 DIAGNOSIS — I700 Atherosclerosis of aorta: Secondary | ICD-10-CM | POA: Diagnosis not present

## 2020-11-07 DIAGNOSIS — B965 Pseudomonas (aeruginosa) (mallei) (pseudomallei) as the cause of diseases classified elsewhere: Secondary | ICD-10-CM | POA: Diagnosis not present

## 2020-11-07 DIAGNOSIS — Z7982 Long term (current) use of aspirin: Secondary | ICD-10-CM | POA: Diagnosis not present

## 2020-11-08 ENCOUNTER — Other Ambulatory Visit (HOSPITAL_COMMUNITY): Payer: Self-pay

## 2020-11-09 ENCOUNTER — Ambulatory Visit (INDEPENDENT_AMBULATORY_CARE_PROVIDER_SITE_OTHER): Payer: Medicare Other | Admitting: Gastroenterology

## 2020-11-09 ENCOUNTER — Other Ambulatory Visit (HOSPITAL_COMMUNITY): Payer: Self-pay

## 2020-11-09 ENCOUNTER — Other Ambulatory Visit (INDEPENDENT_AMBULATORY_CARE_PROVIDER_SITE_OTHER): Payer: Medicare Other

## 2020-11-09 ENCOUNTER — Encounter: Payer: Self-pay | Admitting: Gastroenterology

## 2020-11-09 VITALS — BP 120/60 | HR 126 | Ht 64.0 in | Wt 155.4 lb

## 2020-11-09 DIAGNOSIS — R1033 Periumbilical pain: Secondary | ICD-10-CM | POA: Diagnosis not present

## 2020-11-09 DIAGNOSIS — K208 Other esophagitis without bleeding: Secondary | ICD-10-CM

## 2020-11-09 DIAGNOSIS — D490 Neoplasm of unspecified behavior of digestive system: Secondary | ICD-10-CM | POA: Diagnosis not present

## 2020-11-09 DIAGNOSIS — R110 Nausea: Secondary | ICD-10-CM

## 2020-11-09 DIAGNOSIS — K219 Gastro-esophageal reflux disease without esophagitis: Secondary | ICD-10-CM

## 2020-11-09 LAB — BASIC METABOLIC PANEL
BUN: 13 mg/dL (ref 6–23)
CO2: 26 mEq/L (ref 19–32)
Calcium: 10.5 mg/dL (ref 8.4–10.5)
Chloride: 95 mEq/L — ABNORMAL LOW (ref 96–112)
Creatinine, Ser: 0.55 mg/dL (ref 0.40–1.20)
GFR: 86.69 mL/min (ref >60.00)
Glucose, Bld: 177 mg/dL — ABNORMAL HIGH (ref 70–99)
Potassium: 3.8 mEq/L (ref 3.5–5.1)
Sodium: 134 mEq/L — ABNORMAL LOW (ref 135–145)

## 2020-11-09 LAB — CBC WITH DIFFERENTIAL/PLATELET
Basophils Absolute: 0.1 10*3/uL (ref 0.0–0.1)
Basophils Relative: 0.8 % (ref 0.0–3.0)
Eosinophils Absolute: 0 10*3/uL (ref 0.0–0.7)
Eosinophils Relative: 0.2 % (ref 0.0–5.0)
HCT: 42.5 % (ref 36.0–46.0)
Hemoglobin: 13.8 g/dL (ref 12.0–15.0)
Lymphocytes Relative: 21.4 % (ref 12.0–46.0)
Lymphs Abs: 2.5 10*3/uL (ref 0.7–4.0)
MCHC: 32.5 g/dL (ref 30.0–36.0)
MCV: 94.1 fl (ref 78.0–100.0)
Monocytes Absolute: 1.3 10*3/uL — ABNORMAL HIGH (ref 0.1–1.0)
Monocytes Relative: 11 % (ref 3.0–12.0)
Neutro Abs: 7.8 10*3/uL — ABNORMAL HIGH (ref 1.4–7.7)
Neutrophils Relative %: 66.6 % (ref 43.0–77.0)
Platelets: 454 10*3/uL — ABNORMAL HIGH (ref 150.0–400.0)
RBC: 4.51 Mil/uL (ref 3.87–5.11)
RDW: 13.8 % (ref 11.5–15.5)
WBC: 11.7 10*3/uL — ABNORMAL HIGH (ref 4.0–10.5)

## 2020-11-09 MED ORDER — METOCLOPRAMIDE HCL 5 MG PO TABS: 5.0000 mg | ORAL_TABLET | Freq: Three times a day (TID) | ORAL | 2 refills | Status: DC

## 2020-11-09 MED ORDER — PANTOPRAZOLE SODIUM 40 MG PO TBEC: 40.0000 mg | DELAYED_RELEASE_TABLET | Freq: Two times a day (BID) | ORAL | 5 refills | Status: DC

## 2020-11-09 NOTE — Patient Instructions (Addendum)
If you are age 80 or older, your body mass index should be between 23-30. Your Body mass index is 26.67 kg/m. If this is out of the aforementioned range listed, please consider follow up with your Primary Care Provider. __________________________________________________________  The [facility] providers would like to encourage you to use [facility] to communicate with providers for non-urgent requests or questions.  Due to long hold times on the telephone, sending your provider a message by [facility] may be a faster and more efficient way to get a response.  Please allow 48 business hours for a response.  Please remember that this is for non-urgent requests.   Your provider has requested that you go to the basement level for lab work before leaving today. Press "B" on the elevator. The lab is located at the first door on the left as you exit the elevator.  START Pantoprazole 40 mg 1 tablet twice daily before meals. START Reglan 5 mg 1 tablet three times daily and at bedtime.  Call or send a message to the office in 10 days with an update.  Thank you for entrusting me with your care and choosing [facility]  Alonza Bogus, PA-C

## 2020-11-09 NOTE — Progress Notes (Signed)
11/09/2020 KORRI ASK [id number] Mar 18, 1940   HISTORY OF PRESENT ILLNESS: This is an 80 year old female who is a patient of Dr. Vena Rua.  She is s/p laparoscopic distal pancreatectomy and splenectomy on 08/30/20 by Dr. 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  Her course has been complicated by postoperative pancreatic fistula, which is controlled with her surgical drain.  Since her surgery she has had ongoing issues with severe nausea and poor p.o. intake.  Overall her weight is down about 7 to 10 pounds since July.  She says that the nausea even wakes her up at night.  She says that she has no appetite, but she tries to eat.  She has had 2 CT scans of the abdomen and pelvis with contrast since her surgery.  She was admitted by surgery for symptom management and fluid hydration for about 24 hours a month ago.  She was given IV fluids and Reglan for nausea.  The morning after admission she was able to tolerate breakfast with no vomiting, but has continued with the same symptoms since then.  She said that she had Reglan at home, but was afraid to use it because of the potential side effects.  She states that she gets fleeting sharp mid abdominal pains, but they seem to go away fairly quickly.  The main issue has been ongoing nausea and poor appetite.  CT abdomen and pelvis with contrast 10/13/20:  IMPRESSION: 1. 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 Adhesion of pancreas and gastric wall is again noted with gastric wall edema. There is questionable new small developing fluid collection at this level. There is no extraluminal gas. Fistula or early abscess cannot be excluded. 2. Percutaneous drainage catheter is unchanged in position. 3. Small left pleural effusion has decreased. 4. Colonic diverticulosis without evidence for acute diverticulitis. 5.  Aortic Atherosclerosis (ICD10-I70.0).    EGD Impression 05/13/20: - No gross  lesions in esophagus. LA Grade A esophagitis with no bleeding at the [employer]. - 2 cm hiatal hernia. - Hematin (altered blood/coffee-ground-like material) in the cardia and in the gastric body. - Gastritis in antrum/body. No other gross lesions in the stomach.  Biopsied. - No gross lesions in the duodenal bulb, in the first portion of the duodenum and in the second portion of the duodenum. - Normal ampulla.   Past Medical History:  Diagnosis Date   Anemia    history only at age 15 yrs olds, no current problems   Aortic atherosclerosis ([place])    Arthritis    oa   Blood transfusion 1948   Blood transfusion without reported diagnosis    Breast cancer ([place]) 1994   bilateral / HX skin cancer   Carotid atherosclerosis    Cataract    both eyes   Constipation    COPD (chronic obstructive pulmonary disease) (HCC)    no meds/no inaler   Depression    Diabetes ([place])    type 2 - diet controlled, no meds   Disc disease, degenerative, cervical    trouble turing head and neck at times   Diverticulosis    DVT (deep venous thrombosis) ([place]) 2002   left leg   Dyspnea    occasional with exertion   Dysrhythmia    Family history of adverse reaction to anesthesia    Fibromyalgia    GAD (generalized anxiety disorder)    GERD (gastroesophageal reflux disease)    History of vertigo  Hx of skin cancer, basal cell    Hyperlipidemia    Hypertension    Measles as child   Mumps age 32   Osteoarthritis    Osteoporosis    Pancreatic cyst    Pancreatitis    PONV (postoperative nausea and vomiting)    PONV   Pulmonary nodule    Right renal mass    Seborrheic keratosis    Sinus tachycardia    Sinus tachycardia    Tubular adenoma of colon    Past Surgical History:  Procedure Laterality Date   ABDOMINAL HYSTERECTOMY     achilles tendon adn 2 bone spurs removed Left 05-2012   APPENDECTOMY     BACK SURGERY  1976   lower   BASAL CELL CARCINOMA EXCISION     bilateral mastectomy with  tramflap reconstruction  1994   BIOPSY  05/13/2020   Procedure: BIOPSY;  Surgeon: Irving Copas., MD;  Location: [facility];  Service: Gastroenterology;;   BREAST REDUCTION SURGERY     CARPAL TUNNEL RELEASE Right 03/2017   COLONOSCOPY     ESOPHAGOGASTRODUODENOSCOPY (EGD) WITH PROPOFOL N/A 05/13/2020   Procedure: ESOPHAGOGASTRODUODENOSCOPY (EGD) WITH PROPOFOL;  Surgeon: Irving Copas., MD;  Location: [facility];  Service: Gastroenterology;  Laterality: N/A;   EUS N/A 05/13/2020   Procedure: UPPER ENDOSCOPIC ULTRASOUND (EUS) RADIAL;  Surgeon: Irving Copas., MD;  Location: [facility];  Service: Gastroenterology;  Laterality: N/A;   FOOT SURGERY     KNEE ARTHROSCOPY Right 2008   KNEE SURGERY  2002   arthroscopy-bilateral   LAPAROSCOPIC LIVER ULTRASOUND N/A 08/30/2020   Procedure: LAPAROSCOPIC INTRAOPERATIVE ULTRASOUND;  Surgeon: Dwan Bolt, MD;  Location: [facility];  Service: General;  Laterality: N/A;   LAPAROSCOPIC SPLENECTOMY N/A 08/30/2020   Procedure: LAPAROSCOPIC SPLENECTOMY;  Surgeon: Dwan Bolt, MD;  Location: [facility];  Service: General;  Laterality: N/A;   MASTECTOMY     bilateral   PANCREATECTOMY N/A 08/30/2020   Procedure: LAPAROSCOPIC DISTAL PANCREATECTOMY;  Surgeon: Dwan Bolt, MD;  Location: [facility];  Service: General;  Laterality: N/A;   PARTIAL HYSTERECTOMY     POLYPECTOMY     SHOULDER SURGERY Left    TONSILLECTOMY     TOTAL KNEE ARTHROPLASTY Left 10/14/2012   Procedure: LEFT TOTAL KNEE ARTHROPLASTY;  Surgeon: Gearlean Alf, MD;  Location[facility];  Service: Orthopedics;  Laterality: Left;   TOTAL KNEE ARTHROPLASTY Right 10/20/2013   Procedure: RIGHT TOTAL KNEE ARTHROPLASTY;  Surgeon: Gearlean Alf, MD;  Location[facility];  Service: Orthopedics;  Laterality: Right;    reports that she quit smoking about 28 years ago. Her smoking use included cigarettes. She has a 20.00 pack-year smoking history. She has never used smokeless tobacco. She  reports that she does not currently use alcohol. She reports that she does not use drugs. family history includes Alcohol abuse in her daughter, daughter, and father; Alzheimer's disease in her mother; CAD in her maternal grandfather; COPD in her daughter; Healthy in her brother, grandchild, and sister; Heart disease in her maternal grandfather; Liver disease in her father; Other in her father; Sudden death in her brother and father; Unexplained death in her daughter. Allergies  Allergen Reactions   Alendronate Sodium     Other reaction(s): worse reflux   Atorvastatin Other (See Comments)    Pt reports causes "muscle aches."   Buspirone Hcl     Other reaction(s): headache   Codeine Itching    Mood changes, can take percocet  Ezetimibe     Other reaction(s): myalgias   Ibuprofen Hives    Blisters    Lisinopril Cough   Temazepam     Other reaction(s): daytime drowsiness   Tramadol Hcl     Other reaction(s): dizzy   Trazodone Hcl     Other reaction(s): nightmares   Valdecoxib     Other reaction(s): stomach pain   Aspirin Nausea Only    Patient able to take lower dose, Aspirin 81mg        Outpatient Encounter Medications as of 11/09/2020  Medication Sig   acetaminophen (TYLENOL) 325 MG tablet Take 2 tablets (650 mg total) by mouth every 6 (six) hours as needed for mild pain or headache.   aspirin EC 81 MG tablet Take 1 tablet (81 mg total) by mouth daily.   Calcium Carbonate-Vit D-Min (CALCIUM 1200 PO) Take 1,200 mg by mouth daily.   denosumab (PROLIA) 60 MG/ML SOSY injection Inject 60 mg into the skin every 6 (six) months.    docusate sodium (COLACE) 100 MG capsule Take 1 capsule (100 mg total) by mouth 2 (two) times daily as needed for mild constipation.   esomeprazole (NEXIUM) 40 MG capsule Take 1 capsule (40 mg total) by mouth daily.   Evolocumab (REPATHA) 140 MG/ML SOSY Inject 140 mg into the skin every 14 (fourteen) days.   feeding supplement (ENSURE ENLIVE / ENSURE PLUS)  LIQD Take 237 mLs by mouth 2 (two) times daily between meals.   haemophilus B polysaccharide conjugate vaccine (HIBERIX) 10 MCG injection Inject into the muscle.   meningococcal B (BEXSERO) SUSY injection Inject into the muscle.   meningococcal oligosaccharide (MENVEO) injection Inject into the muscle.   metoprolol tartrate (LOPRESSOR) 50 MG tablet TAKE 1 TABLET(50 MG) BY MOUTH TWICE DAILY (Patient taking differently: Take 50 mg by mouth 2 (two) times daily.)   Multiple Vitamin (MULTIVITAMIN) tablet Take 1 tablet by mouth daily.   VITAMIN D PO Take 1,000 Units by mouth daily.   pneumococcal 20-Val Conj Vacc (PREVNAR 20) 0.5 ML injection Inject into the muscle.   [DISCONTINUED] HYDROcodone-acetaminophen (NORCO/VICODIN) 5-325 MG tablet Take 1 tablet by mouth every 6 (six) hours as needed for severe pain.   [DISCONTINUED] metoCLOPramide (REGLAN) 5 MG tablet Take 1 tablet (5 mg total) by mouth every 8 (eight) hours as needed for nausea or vomiting.   [DISCONTINUED] polyethylene glycol (MIRALAX / GLYCOLAX) 17 g packet Take 17 g by mouth daily as needed for mild constipation.   [DISCONTINUED] saccharomyces boulardii (FLORASTOR) 250 MG capsule Take 1 capsule (250 mg total) by mouth 2 (two) times daily.   No facility-administered encounter medications on file as of 11/09/2020.     REVIEW OF SYSTEMS  : All other systems reviewed and negative except where noted in the History of Present Illness.   PHYSICAL EXAM: BP 120/60   Pulse (!) 126   Ht 5\' 4"  (1.626 m)   Wt 155 lb 6.4 oz (70.5 kg)   LMP  (LMP Unknown)   BMI 26.67 kg/m  General: Well developed white female in no acute distress Head: Normocephalic and atraumatic Eyes:  Sclerae anicteric, conjunctiva pink. Ears: Normal auditory acuity Lungs: Clear throughout to auscultation; no W/R/R. Heart: Regular rate and rhythm; no M/R/G. Abdomen: Soft, non-distended.  BS present.  Well healed scars noted on abdomen.  Mild mid-abdominal  TTP. Musculoskeletal: Symmetrical with no gross deformities  Skin: No lesions on visible extremities Extremities: No edema  Neurological: Alert oriented x 4, grossly non-focal Psychological:  Alert  and cooperative. Normal mood and affect  ASSESSMENT AND PLAN: *80 year old female who is status post laparoscopic distal pancreatectomy and splenectomy for IPMN with high-grade dysplasia on 08/30/2020.  She has had ongoing severe nausea and loss of appetite since then.  Also gets periodic fleeting mid abdominal pains.  She had a couple of CT scans since then and has followed up with surgery.  She is not currently on a PPI.  She said insurance would not cover Nexium.  We will start pantoprazole 40 mg twice daily.  Had Grade A esophagitis on EGD earlier this year.  She had a prescription for Reglan, but was only using it as needed.  We will renew that prescription and have her begin taking it 5 mg before meals and at bedtime.  I was going to use Zofran, but she has a prolonged QT so we will try the Reglan first.  Prescriptions sent to her pharmacy.  She is tachycardic today.  Her blood pressure is okay.  I am going to recheck CBC and BMP today since they were last done about a month ago.  She will call us back in about 10 days with an update on her symptoms.   CC:  Deland Pretty, MD

## 2020-11-10 ENCOUNTER — Other Ambulatory Visit (HOSPITAL_COMMUNITY): Payer: Self-pay

## 2020-11-10 DIAGNOSIS — D649 Anemia, unspecified: Secondary | ICD-10-CM | POA: Diagnosis not present

## 2020-11-10 DIAGNOSIS — Z7982 Long term (current) use of aspirin: Secondary | ICD-10-CM | POA: Diagnosis not present

## 2020-11-10 DIAGNOSIS — Z9181 History of falling: Secondary | ICD-10-CM | POA: Diagnosis not present

## 2020-11-10 DIAGNOSIS — I700 Atherosclerosis of aorta: Secondary | ICD-10-CM | POA: Diagnosis not present

## 2020-11-10 DIAGNOSIS — E785 Hyperlipidemia, unspecified: Secondary | ICD-10-CM | POA: Diagnosis not present

## 2020-11-10 DIAGNOSIS — E8809 Other disorders of plasma-protein metabolism, not elsewhere classified: Secondary | ICD-10-CM | POA: Diagnosis not present

## 2020-11-10 DIAGNOSIS — Z96653 Presence of artificial knee joint, bilateral: Secondary | ICD-10-CM | POA: Diagnosis not present

## 2020-11-10 DIAGNOSIS — N390 Urinary tract infection, site not specified: Secondary | ICD-10-CM | POA: Diagnosis not present

## 2020-11-10 DIAGNOSIS — E871 Hypo-osmolality and hyponatremia: Secondary | ICD-10-CM | POA: Diagnosis not present

## 2020-11-10 DIAGNOSIS — E876 Hypokalemia: Secondary | ICD-10-CM | POA: Diagnosis not present

## 2020-11-10 DIAGNOSIS — J9811 Atelectasis: Secondary | ICD-10-CM | POA: Diagnosis not present

## 2020-11-10 DIAGNOSIS — E440 Moderate protein-calorie malnutrition: Secondary | ICD-10-CM | POA: Diagnosis not present

## 2020-11-10 DIAGNOSIS — A419 Sepsis, unspecified organism: Secondary | ICD-10-CM | POA: Diagnosis not present

## 2020-11-10 DIAGNOSIS — B9561 Methicillin susceptible Staphylococcus aureus infection as the cause of diseases classified elsewhere: Secondary | ICD-10-CM | POA: Diagnosis not present

## 2020-11-10 DIAGNOSIS — J440 Chronic obstructive pulmonary disease with acute lower respiratory infection: Secondary | ICD-10-CM | POA: Diagnosis not present

## 2020-11-10 DIAGNOSIS — D696 Thrombocytopenia, unspecified: Secondary | ICD-10-CM | POA: Diagnosis not present

## 2020-11-10 DIAGNOSIS — E1165 Type 2 diabetes mellitus with hyperglycemia: Secondary | ICD-10-CM | POA: Diagnosis not present

## 2020-11-10 DIAGNOSIS — K5730 Diverticulosis of large intestine without perforation or abscess without bleeding: Secondary | ICD-10-CM | POA: Diagnosis not present

## 2020-11-10 DIAGNOSIS — K219 Gastro-esophageal reflux disease without esophagitis: Secondary | ICD-10-CM | POA: Diagnosis not present

## 2020-11-10 DIAGNOSIS — T8141XD Infection following a procedure, superficial incisional surgical site, subsequent encounter: Secondary | ICD-10-CM | POA: Diagnosis not present

## 2020-11-10 DIAGNOSIS — E872 Acidosis, unspecified: Secondary | ICD-10-CM | POA: Diagnosis not present

## 2020-11-10 DIAGNOSIS — T8144XD Sepsis following a procedure, subsequent encounter: Secondary | ICD-10-CM | POA: Diagnosis not present

## 2020-11-10 DIAGNOSIS — B965 Pseudomonas (aeruginosa) (mallei) (pseudomallei) as the cause of diseases classified elsewhere: Secondary | ICD-10-CM | POA: Diagnosis not present

## 2020-11-12 ENCOUNTER — Telehealth: Payer: Self-pay | Admitting: Gastroenterology

## 2020-11-12 NOTE — Telephone Encounter (Signed)
Patient called stating that insurance would not cover her Protonix prescription unless it was deemed medically necessary by the doctor; otherwise, it would cost her over $300.  Is there something else she could substitute that would work as well for her, or can this be deemed medically necessary?  Please call patient and advise.  Thank you.

## 2020-11-15 NOTE — Telephone Encounter (Signed)
PA submitted and approved for Pantoprazole. Patient informed of approval. Faxed approval to pharmacy.

## 2020-11-16 DIAGNOSIS — E872 Acidosis, unspecified: Secondary | ICD-10-CM | POA: Diagnosis not present

## 2020-11-16 DIAGNOSIS — B9561 Methicillin susceptible Staphylococcus aureus infection as the cause of diseases classified elsewhere: Secondary | ICD-10-CM | POA: Diagnosis not present

## 2020-11-16 DIAGNOSIS — T8144XD Sepsis following a procedure, subsequent encounter: Secondary | ICD-10-CM | POA: Diagnosis not present

## 2020-11-16 DIAGNOSIS — D696 Thrombocytopenia, unspecified: Secondary | ICD-10-CM | POA: Diagnosis not present

## 2020-11-16 DIAGNOSIS — T8141XD Infection following a procedure, superficial incisional surgical site, subsequent encounter: Secondary | ICD-10-CM | POA: Diagnosis not present

## 2020-11-16 DIAGNOSIS — J9811 Atelectasis: Secondary | ICD-10-CM | POA: Diagnosis not present

## 2020-11-16 DIAGNOSIS — A419 Sepsis, unspecified organism: Secondary | ICD-10-CM | POA: Diagnosis not present

## 2020-11-16 DIAGNOSIS — E8809 Other disorders of plasma-protein metabolism, not elsewhere classified: Secondary | ICD-10-CM | POA: Diagnosis not present

## 2020-11-16 DIAGNOSIS — E440 Moderate protein-calorie malnutrition: Secondary | ICD-10-CM | POA: Diagnosis not present

## 2020-11-16 DIAGNOSIS — J440 Chronic obstructive pulmonary disease with acute lower respiratory infection: Secondary | ICD-10-CM | POA: Diagnosis not present

## 2020-11-16 DIAGNOSIS — E871 Hypo-osmolality and hyponatremia: Secondary | ICD-10-CM | POA: Diagnosis not present

## 2020-11-16 DIAGNOSIS — D649 Anemia, unspecified: Secondary | ICD-10-CM | POA: Diagnosis not present

## 2020-11-16 DIAGNOSIS — K5730 Diverticulosis of large intestine without perforation or abscess without bleeding: Secondary | ICD-10-CM | POA: Diagnosis not present

## 2020-11-16 DIAGNOSIS — Z7982 Long term (current) use of aspirin: Secondary | ICD-10-CM | POA: Diagnosis not present

## 2020-11-16 DIAGNOSIS — B965 Pseudomonas (aeruginosa) (mallei) (pseudomallei) as the cause of diseases classified elsewhere: Secondary | ICD-10-CM | POA: Diagnosis not present

## 2020-11-16 DIAGNOSIS — E785 Hyperlipidemia, unspecified: Secondary | ICD-10-CM | POA: Diagnosis not present

## 2020-11-16 DIAGNOSIS — I700 Atherosclerosis of aorta: Secondary | ICD-10-CM | POA: Diagnosis not present

## 2020-11-16 DIAGNOSIS — Z96653 Presence of artificial knee joint, bilateral: Secondary | ICD-10-CM | POA: Diagnosis not present

## 2020-11-16 DIAGNOSIS — K219 Gastro-esophageal reflux disease without esophagitis: Secondary | ICD-10-CM | POA: Diagnosis not present

## 2020-11-16 DIAGNOSIS — E876 Hypokalemia: Secondary | ICD-10-CM | POA: Diagnosis not present

## 2020-11-16 DIAGNOSIS — Z9181 History of falling: Secondary | ICD-10-CM | POA: Diagnosis not present

## 2020-11-16 DIAGNOSIS — E1165 Type 2 diabetes mellitus with hyperglycemia: Secondary | ICD-10-CM | POA: Diagnosis not present

## 2020-11-16 DIAGNOSIS — N390 Urinary tract infection, site not specified: Secondary | ICD-10-CM | POA: Diagnosis not present

## 2020-11-17 DIAGNOSIS — A419 Sepsis, unspecified organism: Secondary | ICD-10-CM | POA: Diagnosis not present

## 2020-11-17 DIAGNOSIS — T8141XD Infection following a procedure, superficial incisional surgical site, subsequent encounter: Secondary | ICD-10-CM | POA: Diagnosis not present

## 2020-11-17 DIAGNOSIS — N390 Urinary tract infection, site not specified: Secondary | ICD-10-CM | POA: Diagnosis not present

## 2020-11-17 DIAGNOSIS — T8144XD Sepsis following a procedure, subsequent encounter: Secondary | ICD-10-CM | POA: Diagnosis not present

## 2020-11-24 NOTE — Progress Notes (Signed)
Addendum: Reviewed and agree with assessment and management plan. As symptoms improved I would recommend to change Reglan to as needed and not scheduled Leeya Rusconi, Lajuan Lines, MD

## 2020-11-29 ENCOUNTER — Other Ambulatory Visit (HOSPITAL_COMMUNITY): Payer: Self-pay

## 2020-11-30 DIAGNOSIS — H9201 Otalgia, right ear: Secondary | ICD-10-CM | POA: Diagnosis not present

## 2020-11-30 DIAGNOSIS — H6123 Impacted cerumen, bilateral: Secondary | ICD-10-CM | POA: Diagnosis not present

## 2020-12-02 ENCOUNTER — Other Ambulatory Visit (HOSPITAL_COMMUNITY): Payer: Self-pay

## 2020-12-03 ENCOUNTER — Other Ambulatory Visit (HOSPITAL_COMMUNITY): Payer: Self-pay

## 2020-12-06 ENCOUNTER — Other Ambulatory Visit (HOSPITAL_COMMUNITY): Payer: Self-pay

## 2020-12-07 ENCOUNTER — Other Ambulatory Visit (HOSPITAL_COMMUNITY): Payer: Self-pay

## 2020-12-08 ENCOUNTER — Telehealth: Payer: Self-pay | Admitting: *Deleted

## 2020-12-08 NOTE — Telephone Encounter (Signed)
   Pre-operative Risk Assessment    Patient Name: Renee [name]  DOB: Apr 30, 1940 MRN: [id number]      Request for Surgical Clearance   Procedure:   LEFT HIP TOTAL REPLACEMENT  Date of Surgery: Clearance TBD                                 Surgeon:  DR. [name]  Surgeon's Group or Practice Name:  [facility] Phone number:  [phone] Fax number:  [phone] ATTN: SHERRI   Type of Clearance Requested: - Medical  - Pharmacy:  Hold Aspirin     Type of Anesthesia:   CHOICE   Additional requests/questions:   Jiles Prows   12/08/2020, 1:51 PM

## 2020-12-09 NOTE — Telephone Encounter (Signed)
    Patient Name: RAIZY AUZENNE  DOB: 1940-06-11 MRN: [id number]  Primary Cardiologist: Lauree Chandler, MD  Chart reviewed as part of pre-operative protocol coverage. Given past medical history and time since last visit, based on ACC/AHA guidelines, JANEANN PAISLEY would be at acceptable risk for the planned procedure without further cardiovascular testing.   The patient was advised that if she develops new symptoms prior to surgery to contact our office to arrange for a follow-up visit, and she verbalized understanding.  Given coronary artery calcification on CT would recommend continuation of aspirin throughout the perioperative period though would be permissible to hold at the discretion of the surgeon.  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  I will route this recommendation to the requesting party via Epic fax function and remove from pre-op pool.  Please call with questions.  [place], [place] 12/09/2020, 12:12 PM

## 2020-12-15 DIAGNOSIS — M25552 Pain in left hip: Secondary | ICD-10-CM | POA: Diagnosis not present

## 2020-12-20 DIAGNOSIS — J449 Chronic obstructive pulmonary disease, unspecified: Secondary | ICD-10-CM | POA: Diagnosis not present

## 2020-12-20 DIAGNOSIS — Z7982 Long term (current) use of aspirin: Secondary | ICD-10-CM | POA: Diagnosis not present

## 2020-12-20 DIAGNOSIS — Z86718 Personal history of other venous thrombosis and embolism: Secondary | ICD-10-CM | POA: Diagnosis not present

## 2020-12-20 DIAGNOSIS — R7303 Prediabetes: Secondary | ICD-10-CM | POA: Diagnosis not present

## 2020-12-20 DIAGNOSIS — M160 Bilateral primary osteoarthritis of hip: Secondary | ICD-10-CM | POA: Diagnosis not present

## 2020-12-20 DIAGNOSIS — I10 Essential (primary) hypertension: Secondary | ICD-10-CM | POA: Diagnosis not present

## 2020-12-20 DIAGNOSIS — Z01818 Encounter for other preprocedural examination: Secondary | ICD-10-CM | POA: Diagnosis not present

## 2020-12-28 DIAGNOSIS — M810 Age-related osteoporosis without current pathological fracture: Secondary | ICD-10-CM | POA: Diagnosis not present

## 2020-12-30 DIAGNOSIS — E7800 Pure hypercholesterolemia, unspecified: Secondary | ICD-10-CM | POA: Diagnosis not present

## 2020-12-30 DIAGNOSIS — E1165 Type 2 diabetes mellitus with hyperglycemia: Secondary | ICD-10-CM | POA: Diagnosis not present

## 2020-12-30 DIAGNOSIS — Z90411 Acquired partial absence of pancreas: Secondary | ICD-10-CM | POA: Diagnosis not present

## 2020-12-30 DIAGNOSIS — Z9081 Acquired absence of spleen: Secondary | ICD-10-CM | POA: Diagnosis not present

## 2020-12-30 DIAGNOSIS — I10 Essential (primary) hypertension: Secondary | ICD-10-CM | POA: Diagnosis not present

## 2020-12-30 DIAGNOSIS — I2584 Coronary atherosclerosis due to calcified coronary lesion: Secondary | ICD-10-CM | POA: Diagnosis not present

## 2021-01-04 ENCOUNTER — Ambulatory Visit (INDEPENDENT_AMBULATORY_CARE_PROVIDER_SITE_OTHER): Payer: Medicare Other | Admitting: Cardiovascular Disease

## 2021-01-04 ENCOUNTER — Encounter: Payer: Self-pay | Admitting: Cardiovascular Disease

## 2021-01-04 ENCOUNTER — Other Ambulatory Visit: Payer: Self-pay

## 2021-01-04 VITALS — BP 132/62 | HR 86 | Ht 64.0 in | Wt 151.8 lb

## 2021-01-04 DIAGNOSIS — Z01810 Encounter for preprocedural cardiovascular examination: Secondary | ICD-10-CM | POA: Diagnosis not present

## 2021-01-04 DIAGNOSIS — I10 Essential (primary) hypertension: Secondary | ICD-10-CM | POA: Diagnosis not present

## 2021-01-04 DIAGNOSIS — I2510 Atherosclerotic heart disease of native coronary artery without angina pectoris: Secondary | ICD-10-CM

## 2021-01-04 DIAGNOSIS — E785 Hyperlipidemia, unspecified: Secondary | ICD-10-CM

## 2021-01-04 NOTE — Patient Instructions (Signed)

## 2021-01-04 NOTE — Progress Notes (Signed)
Chief Complaint  Patient presents with   Follow-up    CAD    History of Present Illness: 80 yo female with history of breast cancer, fibromyalgia, CAD (calcium noted on CT), HTN, HLD, DVT, COPD, RBBB and carotid artery disease here today for follow up. I am meeting her for the first time today. She has been followed in our office by Dr. [name]. Nuclear stress test in 2017 with no ischemia. Mild carotid artery disease by dopplers in 2017. 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 She has an upcoming hip replacement in February 2023.   She is here today for follow up. The patient denies any chest pain, dyspnea, palpitations, lower extremity edema, orthopnea, PND, dizziness, near syncope or syncope. Her only complaint today is severe left hip pain. She is anxious to have her hip surgery.   Primary Care Physician: Deland Pretty, MD   Past Medical History:  Diagnosis Date   Anemia    history only at age 72 yrs olds, no current problems   Aortic atherosclerosis ([place])    Arthritis    oa   Blood transfusion 1948   Blood transfusion without reported diagnosis    Breast cancer ([place]) 1994   bilateral / HX skin cancer   Carotid atherosclerosis    Cataract    both eyes   Constipation    COPD (chronic obstructive pulmonary disease) (HCC)    no meds/no inaler   Depression    Diabetes ([place])    type 2 - diet controlled, no meds   Disc disease, degenerative, cervical    trouble turing head and neck at times   Diverticulosis    DVT (deep venous thrombosis) ([facility]) 2002   left leg   Dyspnea    occasional with exertion   Dysrhythmia    Family history of adverse reaction to anesthesia    Fibromyalgia    GAD (generalized anxiety disorder)    GERD (gastroesophageal reflux disease)    History of vertigo    Hx of skin cancer, basal cell    Hyperlipidemia    Hypertension    Measles as child   Mumps  age 40   Osteoarthritis    Osteoporosis    Pancreatic cyst    Pancreatitis    PONV (postoperative nausea and vomiting)    PONV   Pulmonary nodule    Right renal mass    Seborrheic keratosis    Sinus tachycardia    Sinus tachycardia    Tubular adenoma of colon     Past Surgical History:  Procedure Laterality Date   ABDOMINAL HYSTERECTOMY     achilles tendon adn 2 bone spurs removed Left 05-2012   APPENDECTOMY     BACK SURGERY  1976   lower   BASAL CELL CARCINOMA EXCISION     bilateral mastectomy with tramflap reconstruction  1994   BIOPSY  05/13/2020   Procedure: BIOPSY;  Surgeon: Irving Copas., MD;  Location: [facility];  Service: Gastroenterology;;   BREAST REDUCTION SURGERY     CARPAL TUNNEL RELEASE Right 03/2017   COLONOSCOPY     ESOPHAGOGASTRODUODENOSCOPY (EGD) WITH PROPOFOL N/A 05/13/2020   Procedure: ESOPHAGOGASTRODUODENOSCOPY (EGD) WITH PROPOFOL;  Surgeon: Irving Copas., MD;  Location: [facility];  Service: Gastroenterology;  Laterality: N/A;   EUS N/A 05/13/2020   Procedure: UPPER ENDOSCOPIC ULTRASOUND (EUS) RADIAL;  Surgeon: Rush Landmark Telford Nab., MD;  Location: [facility];  Service: Gastroenterology;  Laterality: N/A;   FOOT SURGERY     KNEE ARTHROSCOPY Right 2008   KNEE SURGERY  2002   arthroscopy-bilateral   LAPAROSCOPIC LIVER ULTRASOUND N/A 08/30/2020   Procedure: LAPAROSCOPIC INTRAOPERATIVE ULTRASOUND;  Surgeon: Dwan Bolt, MD;  Location: [facility];  Service: General;  Laterality: N/A;   LAPAROSCOPIC SPLENECTOMY N/A 08/30/2020   Procedure: LAPAROSCOPIC SPLENECTOMY;  Surgeon: Dwan Bolt, MD;  Location: [facility];  Service: General;  Laterality: N/A;   MASTECTOMY     bilateral   PANCREATECTOMY N/A 08/30/2020   Procedure: LAPAROSCOPIC DISTAL PANCREATECTOMY;  Surgeon: Dwan Bolt, MD;  Location: [facility];  Service: General;  Laterality: N/A;   PARTIAL HYSTERECTOMY     POLYPECTOMY     SHOULDER SURGERY Left    TONSILLECTOMY     TOTAL KNEE  ARTHROPLASTY Left 10/14/2012   Procedure: LEFT TOTAL KNEE ARTHROPLASTY;  Surgeon: Gearlean Alf, MD;  [facility];  Service: Orthopedics;  Laterality: Left;   TOTAL KNEE ARTHROPLASTY Right 10/20/2013   Procedure: RIGHT TOTAL KNEE ARTHROPLASTY;  Surgeon: Gearlean Alf, MD;  [facility];  Service: Orthopedics;  Laterality: Right;    Current Outpatient Medications  Medication Sig Dispense Refill   acetaminophen (TYLENOL) 325 MG tablet Take 2 tablets (650 mg total) by mouth every 6 (six) hours as needed for mild pain or headache.     aspirin EC 81 MG tablet Take 1 tablet (81 mg total) by mouth daily. 90 tablet 3   Calcium Carbonate-Vit D-Min (CALCIUM 1200 PO) Take 1,200 mg by mouth daily.     denosumab (PROLIA) 60 MG/ML SOSY injection Inject 60 mg into the skin every 6 (six) months.      docusate sodium (COLACE) 100 MG capsule Take 1 capsule (100 mg total) by mouth 2 (two) times daily as needed for mild constipation.     Evolocumab (REPATHA) 140 MG/ML SOSY Inject 140 mg into the skin every 14 (fourteen) days.     metoprolol tartrate (LOPRESSOR) 50 MG tablet TAKE 1 TABLET(50 MG) BY MOUTH TWICE DAILY 180 tablet 1   Multiple Vitamin (MULTIVITAMIN) tablet Take 1 tablet by mouth daily.     pantoprazole (PROTONIX) 40 MG tablet Take 1 tablet (40 mg total) by mouth 2 (two) times daily before a meal. 60 tablet 5   VITAMIN D PO Take 1,000 Units by mouth daily.     No current facility-administered medications for this visit.    Allergies  Allergen Reactions   Alendronate Sodium     Other reaction(s): worse reflux   Atorvastatin Other (See Comments)    Pt reports causes "muscle aches."   Buspirone Hcl     Other reaction(s): headache   Codeine Itching    Mood changes, can take percocet   Ezetimibe     Other reaction(s): myalgias   Ibuprofen Hives    Blisters    Lisinopril Cough   Temazepam     Other reaction(s): daytime drowsiness   Tramadol Hcl     Other reaction(s): dizzy    Trazodone Hcl     Other reaction(s): nightmares   Valdecoxib     Other reaction(s): stomach pain   Aspirin Nausea Only    Patient able to take lower dose, Aspirin 81mg      Social History   Socioeconomic History   Marital status: Married    Spouse name: Not on file   Number of children: 2   Years of education: Not on file  Highest education level: Not on file  Occupational History   Occupation: RETIRED    Employer: RETIRED  Tobacco Use   Smoking status: Former    Packs/day: 1.00    Years: 20.00    Pack years: 20.00    Types: Cigarettes    Quit date: 02/11/1992    Years since quitting: 28.9   Smokeless tobacco: Never  Vaping Use   Vaping Use: Never used  Substance and Sexual Activity   Alcohol use: Not Currently    Comment: occasional wine-rare   Drug use: No   Sexual activity: Not Currently    Partners: Male    Birth control/protection: Surgical, Post-menopausal    Comment: hYSTERECTOMY  Other Topics Concern   Not on file  Social History Narrative   Not on file   Social Determinants of Health   Financial Resource Strain: Not on file  Food Insecurity: Not on file  Transportation Needs: Not on file  Physical Activity: Not on file  Stress: Not on file  Social Connections: Not on file  Intimate Partner Violence: Not on file    Family History  Problem Relation Age of Onset   Alzheimer's disease Mother    Alcohol abuse Father    Sudden death Father    Liver disease Father        fatty liver disease; alcohol abuse   Other Father        gastritis   Healthy Brother    Alcohol abuse Daughter    COPD Daughter    Healthy Grandchild    Heart disease Maternal Grandfather    CAD Maternal Grandfather    Sudden death Brother        at birth   Unexplained death Daughter    Alcohol abuse Daughter    Healthy Sister    Colon cancer Neg Hx    Colon polyps Neg Hx    Rectal cancer Neg Hx    Stomach cancer Neg Hx    Esophageal cancer Neg Hx     Review of  Systems:  As stated in the HPI and otherwise negative.   BP 132/62   Pulse 86   Ht 5\' 4"  (1.626 m)   Wt 151 lb 12.8 oz (68.9 kg)   LMP  (LMP Unknown)   SpO2 98%   BMI 26.06 kg/m   Physical Examination: General: Well developed, well nourished, NAD  HEENT: OP clear, mucus membranes moist  SKIN: warm, dry. No rashes. Neuro: No focal deficits  Musculoskeletal: Muscle strength 5/5 all ext  Psychiatric: Mood and affect normal  Neck: No JVD, no carotid bruits, no thyromegaly, no lymphadenopathy.  Lungs:Clear bilaterally, no wheezes, rhonci, crackles Cardiovascular: Regular rate and rhythm. No murmurs, gallops or rubs. Abdomen:Soft. Bowel sounds present. Non-tender.  Extremities: No lower extremity edema. Pulses are 2 + in the bilateral DP/PT.  EKG:  EKG is not ordered today. The ekg ordered today demonstrates   Recent Labs: 09/26/2020: Magnesium 1.9 10/13/2020: ALT 14 11/09/2020: BUN 13; Creatinine, Ser 0.55; Hemoglobin 13.8; Platelets 454.0; Potassium 3.8; Sodium 134   Lipid Panel    Component Value Date/Time   CHOL 174 04/14/2019 1026   TRIG 198.0 (H) 04/19/2020 1634   HDL 73 04/14/2019 1026   CHOLHDL 2.4 04/14/2019 1026   CHOLHDL 5.3 (H) 10/19/2015 0813   VLDL 44 (H) 10/19/2015 0813   LDLCALC 79 04/14/2019 1026   LDLDIRECT 124.0 08/17/2014 0909     Wt Readings from Last 3 Encounters:  01/04/21 151 lb 12.8  oz (68.9 kg)  11/09/20 155 lb 6.4 oz (70.5 kg)  10/13/20 157 lb (71.2 kg)     Assessment and Plan:   1. CAD without angina: Coronary calcifications noted on CT in 2017. Negative stress test in 2017. No chest pain. Continue ASA and beta blocker. She is statin intolerant. She is on Repatha  2. Hyperlipidemia: She is on Repatha. LDL near goal in 2021.   3. HTN: BP controlled. Continue current therapy  4. Pre-operative cardiovascular examination: She has no worrisome symptoms to suggest angina, CHF or arrhythmias. Cardiac exam is normal. She can proceed with her  planned hip surgery without further cardiac testing.   Current medicines are reviewed at length with the patient today.  The patient does not have concerns regarding medicines.  The following changes have been made:  no change  Labs/ tests ordered today include:  No orders of the defined types were placed in this encounter.   Disposition:   F/U with me in one year.   Signed, Lauree Chandler, MD 01/04/2021 3:31 PM    [facility] [address], [place], [place]  [address] Phone: [phone]; Fax: [phone]

## 2021-02-15 DIAGNOSIS — I700 Atherosclerosis of aorta: Secondary | ICD-10-CM | POA: Diagnosis not present

## 2021-02-15 DIAGNOSIS — I6523 Occlusion and stenosis of bilateral carotid arteries: Secondary | ICD-10-CM | POA: Diagnosis not present

## 2021-02-15 DIAGNOSIS — N2889 Other specified disorders of kidney and ureter: Secondary | ICD-10-CM | POA: Diagnosis not present

## 2021-02-15 DIAGNOSIS — Z0000 Encounter for general adult medical examination without abnormal findings: Secondary | ICD-10-CM | POA: Diagnosis not present

## 2021-02-15 DIAGNOSIS — I10 Essential (primary) hypertension: Secondary | ICD-10-CM | POA: Diagnosis not present

## 2021-02-15 DIAGNOSIS — M810 Age-related osteoporosis without current pathological fracture: Secondary | ICD-10-CM | POA: Diagnosis not present

## 2021-02-15 DIAGNOSIS — E118 Type 2 diabetes mellitus with unspecified complications: Secondary | ICD-10-CM | POA: Diagnosis not present

## 2021-02-15 DIAGNOSIS — I2510 Atherosclerotic heart disease of native coronary artery without angina pectoris: Secondary | ICD-10-CM | POA: Diagnosis not present

## 2021-02-21 ENCOUNTER — Other Ambulatory Visit (HOSPITAL_COMMUNITY): Payer: Self-pay

## 2021-02-21 DIAGNOSIS — M1612 Unilateral primary osteoarthritis, left hip: Secondary | ICD-10-CM | POA: Diagnosis not present

## 2021-02-22 ENCOUNTER — Other Ambulatory Visit (HOSPITAL_COMMUNITY): Payer: Self-pay

## 2021-02-22 MED ORDER — REPATHA SURECLICK 140 MG/ML ~~LOC~~ SOAJ
SUBCUTANEOUS | 11 refills | Status: DC
  Filled 2021-02-22: qty 2, 28d supply, fill #0
  Filled 2021-03-23: qty 2, 28d supply, fill #1

## 2021-02-23 ENCOUNTER — Other Ambulatory Visit (HOSPITAL_COMMUNITY): Payer: Self-pay

## 2021-02-28 DIAGNOSIS — H524 Presbyopia: Secondary | ICD-10-CM | POA: Diagnosis not present

## 2021-02-28 DIAGNOSIS — E7800 Pure hypercholesterolemia, unspecified: Secondary | ICD-10-CM | POA: Diagnosis not present

## 2021-03-01 DIAGNOSIS — D490 Neoplasm of unspecified behavior of digestive system: Secondary | ICD-10-CM | POA: Diagnosis not present

## 2021-03-01 NOTE — Patient Instructions (Addendum)
DUE TO COVID-19 ONLY ONE VISITOR IS ALLOWED TO COME WITH YOU AND STAY IN THE WAITING ROOM ONLY DURING PRE OP AND PROCEDURE.   **NO VISITORS ARE ALLOWED IN THE SHORT STAY AREA OR RECOVERY ROOM!!**  IF YOU WILL BE ADMITTED INTO THE HOSPITAL YOU ARE ALLOWED ONLY TWO SUPPORT PEOPLE DURING VISITATION HOURS ONLY (7 AM -8PM)   The support person(s) must pass our screening, gel in and out, and wear a mask at all times, including in the patients room. Patients must also wear a mask when staff or their support person are in the room. Visitors GUEST BADGE MUST BE WORN VISIBLY  One adult visitor may remain with you overnight and MUST be in the room by 8 P.M.  No visitors under the age of 34. Any visitor under the age of 46 must be accompanied by an adult.    COVID SWAB TESTING MUST BE COMPLETED ON:  03/11/21 @ 9:30 AM   Site: [facility] [place] Enter: Main Entrance have a seat in the waiting area to the right of main entrance (DO NOT [place]!!!!!) Dial: [phone] to alert staff you have arrived  You are not required to quarantine, however you are required to wear a well-fitted mask when you are out and around people not in your household.  Hand Hygiene often Do NOT share personal items Notify your provider if you are in close contact with someone who has COVID or you develop fever 100.4 or greater, new onset of sneezing, cough, sore throat, shortness of breath or body aches.       Your procedure is scheduled on: 03/15/21   Report to [facility] Main Entrance    Report to admitting at 10:10 AM   Call this number if you have problems the morning of surgery [phone]   Do not eat food :After Midnight.   May have liquids until  9:50 AM day of surgery  CLEAR LIQUID DIET  Foods Allowed                                                                     Foods Excluded  Water, Black Coffee and tea, regular and decaf                              liquids that you cannot  Plain Jell-O in any flavor  (No red)                                           see through such as: Fruit ices (not with fruit pulp)                                     milk, soups, orange juice              Iced Popsicles (No red)  All solid food                                   Apple juices Sports drinks like Gatorade (No red) Lightly seasoned clear broth or consume(fat free) Sugar     The day of surgery:  Drink ONE (1) Pre-Surgery G2 at 9:50 AM the morning of surgery. Drink in one sitting. Do not sip.  This drink was given to you during your hospital  pre-op appointment visit. Nothing else to drink after completing the  Pre-Surgery Clear Ensure or G2.          If you have questions, please contact your surgeons office.  FOLLOW BOWEL PREP INSTRUCTIONS YOU RECEIVED FROM YOUR SURGEON'S OFFICE!!!     Oral Hygiene is also important to reduce your risk of infection.                                    Remember - BRUSH YOUR TEETH THE MORNING OF SURGERY WITH YOUR REGULAR TOOTHPASTE   Stop all vitamins and supplements 7 days before surgery   Take these medicines the morning of surgery with A SIP OF WATER: Tylenol, Metoprolol, Protonix  DO NOT TAKE ANY ORAL DIABETIC MEDICATIONS DAY OF YOUR SURGERY  How to Manage Your Diabetes Before and After Surgery  Why is it important to control my blood sugar before and after surgery? Improving blood sugar levels before and after surgery helps healing and can limit problems. A way of improving blood sugar control is eating a healthy diet by:  Eating less sugar and carbohydrates  Increasing activity/exercise  Talking with your doctor about reaching your blood sugar goals High blood sugars (greater than 180 mg/dL) can raise your risk of infections and slow your recovery, so you will need to focus on controlling your diabetes during the weeks before surgery. Make sure that the  doctor who takes care of your diabetes knows about your planned surgery including the date and location.  How do I manage my blood sugar before surgery? Check your blood sugar at least 4 times a day, starting 2 days before surgery, to make sure that the level is not too high or low. Check your blood sugar the morning of your surgery when you wake up and every 2 hours until you get to the Short Stay unit. If your blood sugar is less than 70 mg/dL, you will need to treat for low blood sugar: Do not take insulin. Treat a low blood sugar (less than 70 mg/dL) with  cup of clear juice (cranberry or apple), 4 glucose tablets, OR glucose gel. Recheck blood sugar in 15 minutes after treatment (to make sure it is greater than 70 mg/dL). If your blood sugar is not greater than 70 mg/dL on recheck, call [phone] for further instructions. Report your blood sugar to the short stay nurse when you get to Short Stay.  If you are admitted to the hospital after surgery: Your blood sugar will be checked by the staff and you will probably be given insulin after surgery (instead of oral diabetes medicines) to make sure you have good blood sugar levels. The goal for blood sugar control after surgery is 80-180 mg/dL.   WHAT DO I DO ABOUT MY DIABETES MEDICATION?  Do not take oral diabetes medicines (pills) the morning of surgery.  THE DAY  BEFORE SURGERY, take Metformin as prescribed        THE MORNING OF SURGERY, do not take Metformin  Reviewed and Endorsed by [facility] Patient Education Committee, August 2015                               You may not have any metal on your body including hair pins, jewelry, and body piercing             Do not wear make-up, lotions, powders, perfumes/cologne, or deodorant  Do not wear nail polish including gel and S&S, artificial/acrylic nails, or any other type of covering on natural nails including finger and toenails. If you have artificial nails, gel coating, etc.  that needs to be removed by a nail salon please have this removed prior to surgery or surgery may need to be canceled/ delayed if the surgeon/ anesthesia feels like they are unable to be safely monitored.   Do not shave  48 hours prior to surgery.               Men may shave face and neck.   Do not bring valuables to the hospital. [place].   Bring small overnight bag day of surgery.    Patients discharged on the day of surgery will not be allowed to drive home.  Someone needs to stay with you for the first 24 hours after anesthesia.   Special Instructions: Bring a copy of your healthcare power of attorney and living will documents         the day of surgery if you haven't scanned them before.              Please read over the following fact sheets you were given: IF YOU HAVE QUESTIONS ABOUT YOUR PRE-OP INSTRUCTIONS PLEASE CALL [phone]     [facility] Preparing [facility] Before surgery, you can play an important role.  Because skin is not sterile, your skin needs to be as free of germs as possible.  You can reduce the number of germs on your skin by washing with CHG (chlorahexidine gluconate) soap before surgery.  CHG is an antiseptic cleaner which kills germs and bonds with the skin to continue killing germs even after washing. Please DO NOT use if you have an allergy to CHG or antibacterial soaps.  If your skin becomes reddened/irritated stop using the CHG and inform your nurse when you arrive at Short Stay. Do not shave (including legs and underarms) for at least 48 hours prior to the first CHG shower.  You may shave your face/neck.  Please follow these instructions carefully:  1.  Shower with CHG Soap the night before surgery and the  morning of surgery.  2.  If you choose to wash your hair, wash your hair first as usual with your normal  shampoo.  3.  After you shampoo, rinse your hair and body thoroughly to remove the shampoo.                              4.  Use CHG as you would any other liquid soap.  You can apply chg directly to the skin and wash.  Gently with a scrungie or clean washcloth.  5.  Apply the CHG  Soap to your body ONLY FROM THE NECK DOWN.   Do   not use on face/ open                           Wound or open sores. Avoid contact with eyes, ears mouth and   genitals (private parts).                       Wash face,  Genitals (private parts) with your normal soap.             6.  Wash thoroughly, paying special attention to the area where your    surgery  will be performed.  7.  Thoroughly rinse your body with warm water from the neck down.  8.  DO NOT shower/wash with your normal soap after using and rinsing off the CHG Soap.                9.  Pat yourself dry with a clean towel.            10.  Wear clean pajamas.            11.  Place clean sheets on your bed the night of your first shower and do not  sleep with pets. Day of Surgery : Do not apply any lotions/deodorants the morning of surgery.  Please wear clean clothes to the hospital/surgery center.  FAILURE TO FOLLOW THESE INSTRUCTIONS MAY RESULT IN THE CANCELLATION OF YOUR SURGERY  PATIENT SIGNATURE_________________________________  NURSE SIGNATURE__________________________________  ________________________________________________________________________   Renee [name]  An incentive spirometer is a tool that can help keep your lungs clear and active. This tool measures how well you are filling your lungs with each breath. Taking long deep breaths may help reverse or decrease the chance of developing breathing (pulmonary) problems (especially infection) following: A long period of time when you are unable to move or be active. BEFORE THE PROCEDURE  If the spirometer includes an indicator to show your best effort, your nurse or respiratory therapist will set it to a desired goal. If possible, sit up straight or lean slightly forward.  Try not to slouch. Hold the incentive spirometer in an upright position. INSTRUCTIONS FOR USE  Sit on the edge of your bed if possible, or sit up as far as you can in bed or on a chair. Hold the incentive spirometer in an upright position. Breathe out normally. Place the mouthpiece in your mouth and seal your lips tightly around it. Breathe in slowly and as deeply as possible, raising the piston or the ball toward the top of the column. Hold your breath for 3-5 seconds or for as long as possible. Allow the piston or ball to fall to the bottom of the column. Remove the mouthpiece from your mouth and breathe out normally. Rest for a few seconds and repeat Steps 1 through 7 at least 10 times every 1-2 hours when you are awake. Take your time and take a few normal breaths between deep breaths. The spirometer may include an indicator to show your best effort. Use the indicator as a goal to work toward during each repetition. After each set of 10 deep breaths, practice coughing to be sure your lungs are clear. If you have an incision (the cut made at the time of surgery), support your incision when coughing by placing a pillow or rolled up towels firmly against it. Once you are able  to get out of bed, walk around indoors and cough well. You may stop using the incentive spirometer when instructed by your caregiver.  RISKS AND COMPLICATIONS Take your time so you do not get dizzy or light-headed. If you are in pain, you may need to take or ask for pain medication before doing incentive spirometry. It is harder to take a deep breath if you are having pain. AFTER USE Rest and breathe slowly and easily. It can be helpful to keep track of a log of your progress. Your caregiver can provide you with a simple table to help with this. If you are using the spirometer at home, follow these instructions: [place] IF:  You are having difficultly using the spirometer. You have trouble using the spirometer  as often as instructed. Your pain medication is not giving enough relief while using the spirometer. You develop fever of 100.5 F (38.1 C) or higher. SEEK IMMEDIATE MEDICAL CARE IF:  You cough up bloody sputum that had not been present before. You develop fever of 102 F (38.9 C) or greater. You develop worsening pain at or near the incision site. MAKE SURE YOU:  Understand these instructions. Will watch your condition. Will get help right away if you are not doing well or get worse. Document Released: 05/29/2006 Document Revised: 04/10/2011 Document Reviewed: 07/30/2006 [employer] Patient Information 2014 [employer], [place].   ________________________________________________________________________  WHAT IS A BLOOD TRANSFUSION? Blood Transfusion Information  A transfusion is the replacement of blood or some of its parts. Blood is made up of multiple cells which provide different functions. Red blood cells carry oxygen and are used for blood loss replacement. White blood cells fight against infection. Platelets control bleeding. Plasma helps clot blood. Other blood products are available for specialized needs, such as hemophilia or other clotting disorders. BEFORE THE TRANSFUSION  Who gives blood for transfusions?  Healthy volunteers who are fully evaluated to make sure their blood is safe. This is blood bank blood. Transfusion therapy is the safest it has ever been in the practice of medicine. Before blood is taken from a donor, a complete history is taken to make sure that person has no history of diseases nor engages in risky social behavior (examples are intravenous drug use or sexual activity with multiple partners). The donor's travel history is screened to minimize risk of transmitting infections, such as malaria. The donated blood is tested for signs of infectious diseases, such as HIV and hepatitis. The blood is then tested to be sure it is compatible with you in order to minimize  the chance of a transfusion reaction. If you or a relative donates blood, this is often done in anticipation of surgery and is not appropriate for emergency situations. It takes many days to process the donated blood. RISKS AND COMPLICATIONS Although transfusion therapy is very safe and saves many lives, the main dangers of transfusion include:  Getting an infectious disease. Developing a transfusion reaction. This is an allergic reaction to something in the blood you were given. Every precaution is taken to prevent this. The decision to have a blood transfusion has been considered carefully by your caregiver before blood is given. Blood is not given unless the benefits outweigh the risks. AFTER THE TRANSFUSION Right after receiving a blood transfusion, you will usually feel much better and more energetic. This is especially true if your red blood cells have gotten low (anemic). The transfusion raises the level of the red blood cells which carry oxygen, and  this usually causes an energy increase. The nurse administering the transfusion will monitor you carefully for complications. HOME CARE INSTRUCTIONS  No special instructions are needed after a transfusion. You may find your energy is better. Speak with your caregiver about any limitations on activity for underlying diseases you may have. SEEK MEDICAL CARE IF:  Your condition is not improving after your transfusion. You develop redness or irritation at the intravenous (IV) site. SEEK IMMEDIATE MEDICAL CARE IF:  Any of the following symptoms occur over the next 12 hours: Shaking chills. You have a temperature by mouth above 102 F (38.9 C), not controlled by medicine. Chest, back, or muscle pain. People around you feel you are not acting correctly or are confused. Shortness of breath or difficulty breathing. Dizziness and fainting. You get a rash or develop hives. You have a decrease in urine output. Your urine turns a dark color or changes  to pink, red, or brown. Any of the following symptoms occur over the next 10 days: You have a temperature by mouth above 102 F (38.9 C), not controlled by medicine. Shortness of breath. Weakness after normal activity. The white part of the eye turns yellow (jaundice). You have a decrease in the amount of urine or are urinating less often. Your urine turns a dark color or changes to pink, red, or brown. Document Released: 01/14/2000 Document Revised: 04/10/2011 Document Reviewed: 09/02/2007 [facility] Patient Information 2014 [place], [place].  _______________________________________________________________________

## 2021-03-01 NOTE — Progress Notes (Addendum)
COVID swab appointment: 03/11/21 @ 0930  COVID Vaccine Completed: yes x2 Date COVID Vaccine completed: Has received booster: COVID vaccine manufacturer: [employer]   Date of COVID positive in last 90 days: no  PCP - Deland Pretty, MD Cardiologist - Lauree Chandler, MD  Cardiac clearance by Lauree Chandler 01/04/21 in Epic  Chest x-ray - 09/27/20 Epic EKG - 09/27/20 HR 122 Stress Test - 07/16/15 Epic ECHO - 12/03/12 Epic Cardiac Cath - n/a Pacemaker/ICD device last checked: n/a Spinal Cord Stimulator: n/a  Sleep Study - yes, negative CPAP -   Fasting Blood Sugar - doesn't check at home, was never told to per pt Checks Blood Sugar __ times a day  Blood Thinner Instructions: Aspirin Instructions: ASA 81, hold 7 days before surgery Last Dose:  Activity level: Can go up a flight of stairs and perform activities of daily living without stopping and without symptoms of chest pain or shortness of breath.     Anesthesia review: HTN, Carotid stenosis, thrombocytopenia, DVT, dysphagia, DM, DVT, COPD, anemia  Patient denies shortness of breath, fever, cough and chest pain at PAT appointment   Patient verbalized understanding of instructions that were given to them at the PAT appointment. Patient was also instructed that they will need to review over the PAT instructions again at home before surgery.

## 2021-03-02 ENCOUNTER — Encounter (HOSPITAL_COMMUNITY): Payer: Self-pay

## 2021-03-02 ENCOUNTER — Other Ambulatory Visit: Payer: Self-pay

## 2021-03-02 ENCOUNTER — Encounter (HOSPITAL_COMMUNITY)
Admission: RE | Admit: 2021-03-02 | Discharge: 2021-03-02 | Disposition: A | Discharge status: Home / Self Care | Payer: Medicare Other | Source: Ambulatory Visit | Attending: Orthopedic Surgery | Admitting: Orthopedic Surgery

## 2021-03-02 VITALS — BP 137/77 | HR 84 | Temp 98.5°F | Resp 16 | Ht 64.0 in | Wt 146.2 lb

## 2021-03-02 DIAGNOSIS — J449 Chronic obstructive pulmonary disease, unspecified: Secondary | ICD-10-CM | POA: Diagnosis not present

## 2021-03-02 DIAGNOSIS — M1612 Unilateral primary osteoarthritis, left hip: Secondary | ICD-10-CM | POA: Insufficient documentation

## 2021-03-02 DIAGNOSIS — E119 Type 2 diabetes mellitus without complications: Secondary | ICD-10-CM | POA: Diagnosis not present

## 2021-03-02 DIAGNOSIS — Z01812 Encounter for preprocedural laboratory examination: Secondary | ICD-10-CM | POA: Insufficient documentation

## 2021-03-02 DIAGNOSIS — I4510 Unspecified right bundle-branch block: Secondary | ICD-10-CM | POA: Diagnosis not present

## 2021-03-02 DIAGNOSIS — I10 Essential (primary) hypertension: Secondary | ICD-10-CM | POA: Insufficient documentation

## 2021-03-02 DIAGNOSIS — I2510 Atherosclerotic heart disease of native coronary artery without angina pectoris: Secondary | ICD-10-CM | POA: Insufficient documentation

## 2021-03-02 DIAGNOSIS — Z86718 Personal history of other venous thrombosis and embolism: Secondary | ICD-10-CM | POA: Insufficient documentation

## 2021-03-02 DIAGNOSIS — I779 Disorder of arteries and arterioles, unspecified: Secondary | ICD-10-CM | POA: Insufficient documentation

## 2021-03-02 DIAGNOSIS — D75839 Thrombocytosis, unspecified: Secondary | ICD-10-CM | POA: Diagnosis not present

## 2021-03-02 DIAGNOSIS — Z01818 Encounter for other preprocedural examination: Secondary | ICD-10-CM

## 2021-03-02 HISTORY — DX: Pneumonia, unspecified organism: J18.9

## 2021-03-02 HISTORY — DX: Personal history of urinary calculi: Z87.442

## 2021-03-02 LAB — CBC
HCT: 46.4 % — ABNORMAL HIGH (ref 36.0–46.0)
Hemoglobin: 15.1 g/dL — ABNORMAL HIGH (ref 12.0–15.0)
MCH: 31.7 pg (ref 26.0–34.0)
MCHC: 32.5 g/dL (ref 30.0–36.0)
MCV: 97.5 fL (ref 80.0–100.0)
Platelets: 462 K/uL — ABNORMAL HIGH (ref 150–400)
RBC: 4.76 MIL/uL (ref 3.87–5.11)
RDW: 14.8 % (ref 11.5–15.5)
WBC: 10 K/uL (ref 4.0–10.5)
nRBC: 0 % (ref 0.0–0.2)

## 2021-03-02 LAB — BASIC METABOLIC PANEL WITH GFR
Anion gap: 10 (ref 5–15)
BUN: 17 mg/dL (ref 8–23)
CO2: 26 mmol/L (ref 22–32)
Calcium: 10 mg/dL (ref 8.9–10.3)
Chloride: 99 mmol/L (ref 98–111)
Creatinine, Ser: 0.73 mg/dL (ref 0.44–1.00)
GFR, Estimated: >60 mL/min (ref >60)
Glucose, Bld: 93 mg/dL (ref 70–99)
Potassium: 4.2 mmol/L (ref 3.5–5.1)
Sodium: 135 mmol/L (ref 135–145)

## 2021-03-02 LAB — TYPE AND SCREEN
ABO/RH(D): B NEG
Antibody Screen: NEGATIVE

## 2021-03-02 LAB — GLUCOSE, CAPILLARY: Glucose-Capillary: 84 mg/dL (ref 70–99)

## 2021-03-02 LAB — SURGICAL PCR SCREEN
MRSA, PCR: NEGATIVE
Staphylococcus aureus: NEGATIVE

## 2021-03-03 LAB — HEMOGLOBIN A1C
Hgb A1c MFr Bld: 7.2 % — ABNORMAL HIGH (ref 4.8–5.6)
Mean Plasma Glucose: 160 mg/dL

## 2021-03-03 NOTE — H&P (Signed)
HIP ARTHROPLASTY ADMISSION H&P  Patient ID: Renee [name] MRN: [id number] DOB/AGE: 08-04-1940 81 y.o.  Chief Complaint: left hip pain.  Planned Procedure Date: 03/15/21 Medical Clearance by Deon Pilling NP   Cardiac Clearance by Dr. Angelena Form GI clearance by Alonza Bogus PA-C   HPI: Renee [name] is a 81 y.o. female who presents for evaluation of OA LEFT HIP. The patient has a history of pain and functional disability in the left hip due to arthritis and has failed non-surgical conservative treatments for greater than 12 weeks to include NSAID's and/or analgesics, corticosteriod injections, use of assistive devices, and activity modification.  Onset of symptoms was gradual, starting 2 years ago with gradually worsening course since that time. The patient noted no past surgery on the left hip.  Patient currently rates pain at 8 out of 10 with activity. Patient has night pain, worsening of pain with activity and weight bearing, and pain that interferes with activities of daily living.  Patient has evidence of subchondral sclerosis, periarticular osteophytes, and joint space narrowing by imaging studies.  There is no active infection.  Past Medical History:  Diagnosis Date   Anemia    history only at age 31 yrs olds, no current problems   Aortic atherosclerosis ([place])    Arthritis    oa   Blood transfusion 1948   Blood transfusion without reported diagnosis    Breast cancer ([place]) 1994   bilateral / HX skin cancer   Carotid atherosclerosis    Cataract    both eyes   Constipation    COPD (chronic obstructive pulmonary disease) (HCC)    no meds/no inaler   Depression    Diabetes ([place])    type 2 - diet controlled, no meds   Disc disease, degenerative, cervical    trouble turing head and neck at times   Diverticulosis    DVT (deep venous thrombosis) ([place]) 2002   left leg   Dyspnea    occasional with exertion   Dysrhythmia    Family history of adverse reaction to anesthesia     Fibromyalgia    GAD (generalized anxiety disorder)    GERD (gastroesophageal reflux disease)    History of kidney stones    History of vertigo    Hx of skin cancer, basal cell    Hyperlipidemia    Hypertension    Measles as child   Mumps age 61   Osteoarthritis    Osteoporosis    Pancreatic cyst    Pancreatitis    Pneumonia    PONV (postoperative nausea and vomiting)    PONV   Pulmonary nodule    Right renal mass    Seborrheic keratosis    Sinus tachycardia    Sinus tachycardia    Tubular adenoma of colon    Past Surgical History:  Procedure Laterality Date   ABDOMINAL HYSTERECTOMY     achilles tendon adn 2 bone spurs removed Left 05/2012   APPENDECTOMY     BACK SURGERY  1976   lower   BASAL CELL CARCINOMA EXCISION     bilateral mastectomy with tramflap reconstruction  1994   BIOPSY  05/13/2020   Procedure: BIOPSY;  Surgeon: Irving Copas., MD;  Location: [facility];  Service: Gastroenterology;;   BREAST REDUCTION SURGERY     CARPAL TUNNEL RELEASE Right 03/2017   CATARACT EXTRACTION     COLONOSCOPY     ESOPHAGOGASTRODUODENOSCOPY (EGD) WITH PROPOFOL N/A 05/13/2020   Procedure: ESOPHAGOGASTRODUODENOSCOPY (EGD) WITH PROPOFOL;  Surgeon:  Mansouraty, Telford Nab., MD;  Location: [facility];  Service: Gastroenterology;  Laterality: N/A;   EUS N/A 05/13/2020   Procedure: UPPER ENDOSCOPIC ULTRASOUND (EUS) RADIAL;  Surgeon: Irving Copas., MD;  Location: [facility];  Service: Gastroenterology;  Laterality: N/A;   FOOT SURGERY     KNEE ARTHROSCOPY Right 2008   KNEE SURGERY  2002   arthroscopy-bilateral   LAPAROSCOPIC LIVER ULTRASOUND N/A 08/30/2020   Procedure: LAPAROSCOPIC INTRAOPERATIVE ULTRASOUND;  Surgeon: Dwan Bolt, MD;  Location: [facility];  Service: General;  Laterality: N/A;   LAPAROSCOPIC SPLENECTOMY N/A 08/30/2020   Procedure: LAPAROSCOPIC SPLENECTOMY;  Surgeon: Dwan Bolt, MD;  Location: [facility];  Service: General;  Laterality: N/A;    MASTECTOMY     bilateral   PANCREATECTOMY N/A 08/30/2020   Procedure: LAPAROSCOPIC DISTAL PANCREATECTOMY;  Surgeon: Dwan Bolt, MD;  Location: [facility];  Service: General;  Laterality: N/A;   PARTIAL HYSTERECTOMY     POLYPECTOMY     SHOULDER SURGERY Left    TONSILLECTOMY     TOTAL KNEE ARTHROPLASTY Left 10/14/2012   Procedure: LEFT TOTAL KNEE ARTHROPLASTY;  Surgeon: Gearlean Alf, MD;  [facility];  Service: Orthopedics;  Laterality: Left;   TOTAL KNEE ARTHROPLASTY Right 10/20/2013   Procedure: RIGHT TOTAL KNEE ARTHROPLASTY;  Surgeon: Gearlean Alf, MD;  [facility];  Service: Orthopedics;  Laterality: Right;   Allergies  Allergen Reactions   Alendronate Sodium     Other reaction(s): worse reflux   Atorvastatin Other (See Comments)    Pt reports causes "muscle aches."   Buspirone Hcl     Other reaction(s): headache   Codeine Itching    Mood changes, can take percocet   Ezetimibe     Other reaction(s): myalgias   Ibuprofen Hives    Blisters    Lisinopril Cough   Temazepam     Other reaction(s): daytime drowsiness   Tramadol Hcl     Other reaction(s): dizzy   Trazodone Hcl     Other reaction(s): nightmares   Valdecoxib     Other reaction(s): stomach pain   Aspirin Nausea Only    Patient able to take lower dose, Aspirin 81mg     Prior to Admission medications   Medication Sig Start Date End Date Taking? Authorizing Provider  acetaminophen (TYLENOL) 325 MG tablet Take 2 tablets (650 mg total) by mouth every 6 (six) hours as needed for mild pain or headache. 09/03/20  Yes Dwan Bolt, MD  aspirin EC 81 MG tablet Take 1 tablet (81 mg total) by mouth daily. 01/18/18  Yes Dorothy Spark, MD  Calcium Carbonate-Vit D-Min (CALCIUM 1200 PO) Take 1,200 mg by mouth daily.   Yes [provider]  cholecalciferol (VITAMIN D3) 25 MCG (1000 UNIT) tablet Take 1,000 Units by mouth daily.   Yes [provider]  denosumab (PROLIA) 60 MG/ML SOSY injection  Inject 60 mg into the skin every 6 (six) months.  06/10/18  Yes [provider]  Evolocumab (REPATHA SURECLICK) 413 MG/ML SOAJ Inject 1 mL into the skin every 14 days 02/22/21  Yes   metFORMIN (GLUCOPHAGE-XR) 500 MG 24 hr tablet Take 500 mg by mouth 2 (two) times daily. 01/04/21  Yes [provider]  metoprolol tartrate (LOPRESSOR) 50 MG tablet TAKE 1 TABLET(50 MG) BY MOUTH TWICE DAILY 08/12/20  Yes Weaver, Scott T, PA-C  Multiple Vitamin (MULTIVITAMIN) tablet Take 1 tablet by mouth daily.   Yes [provider]  pantoprazole (PROTONIX) 40 MG  tablet Take 1 tablet (40 mg total) by mouth 2 (two) times daily before a meal. 11/09/20  Yes Zehr, Janett Billow D, PA-C  docusate sodium (COLACE) 100 MG capsule Take 1 capsule (100 mg total) by mouth 2 (two) times daily as needed for mild constipation. Patient not taking: Reported on 02/25/2021 09/29/20   Barton Dubois, MD   Social History   Socioeconomic History   Marital status: Married    Spouse name: Not on file   Number of children: 2   Years of education: Not on file   Highest education level: Not on file  Occupational History   Occupation: RETIRED    Employer: RETIRED  Tobacco Use   Smoking status: Former    Packs/day: 1.00    Years: 20.00    Pack years: 20.00    Types: Cigarettes    Quit date: 02/11/1992    Years since quitting: 29.0   Smokeless tobacco: Never  Vaping Use   Vaping Use: Never used  Substance and Sexual Activity   Alcohol use: Not Currently    Comment: occasional wine-rare   Drug use: No   Sexual activity: Not Currently    Partners: Male    Birth control/protection: Surgical, Post-menopausal    Comment: hYSTERECTOMY  Other Topics Concern   Not on file  Social History Narrative   Not on file   Social Determinants of Health   Financial Resource Strain: Not on file  Food Insecurity: Not on file  Transportation Needs: Not on file  Physical Activity: Not on file  Stress: Not on file  Social  Connections: Not on file   Family History  Problem Relation Age of Onset   Alzheimer's disease Mother    Alcohol abuse Father    Sudden death Father    Liver disease Father        fatty liver disease; alcohol abuse   Other Father        gastritis   Healthy Brother    Alcohol abuse Daughter    COPD Daughter    Healthy Grandchild    Heart disease Maternal Grandfather    CAD Maternal Grandfather    Sudden death Brother        at birth   Unexplained death Daughter    Alcohol abuse Daughter    Healthy Sister    Colon cancer Neg Hx    Colon polyps Neg Hx    Rectal cancer Neg Hx    Stomach cancer Neg Hx    Esophageal cancer Neg Hx     ROS: Currently denies lightheadedness, dizziness, Fever, chills, CP, SOB.   No personal history of PE, MI, or CVA. + h/o DVT after knee surgery No loose teeth or dentures All other systems have been reviewed and were otherwise currently negative with the exception of those mentioned in the HPI and as above.  Objective: Vitals: Ht: 5'4" Wt: 149.2 lbs Temp: 98.5 BP: 132/79 Pulse: 89 O2 97% on room air.   Physical Exam: General: Alert, NAD. Trendelenberg Gait  HEENT: EOMI, Good Neck Extension  Pulm: No increased work of breathing.  Clear B/L A/P w/o crackle or wheeze. CV: RRR, No m/g/r appreciated  GI: soft, NT, ND. BS x 4 quadrants Neuro: CN II-XII grossly intact without focal deficit.  Sensation intact distally Skin: No lesions in the area of chief complaint MSK/Surgical Site: + TTP. Hip pain with ROM. + [place]. - SLR. - FABER. + FADIR. Decreased strength.  NVI.    Imaging Review Plain  radiographs demonstrate severe degenerative joint disease of the bilateral hips.   The bone quality appears to be fair for age and reported activity level.  Preoperative templating of the joint replacement has been completed, documented, and submitted to the Operating Room personnel in order to optimize intra-operative equipment  management.  Assessment: OA LEFT HIP Active Problems:   * No active hospital problems. *   Plan: Plan for Procedure(s): TOTAL HIP ARTHROPLASTY ANTERIOR APPROACH  The patient history, physical exam, clinical judgement of the provider and imaging are consistent with end stage degenerative joint disease and total joint arthroplasty is deemed medically necessary. The treatment options including medical management, injection therapy, and arthroplasty were discussed at length. The risks and benefits of Procedure(s): TOTAL HIP ARTHROPLASTY ANTERIOR APPROACH were presented and reviewed.  The risks of nonoperative treatment, versus surgical intervention including but not limited to continued pain, aseptic loosening, stiffness, dislocation/subluxation, infection, bleeding, nerve injury, blood clots, cardiopulmonary complications, morbidity, mortality, among others were discussed. The patient verbalizes understanding and wishes to proceed with the plan.  Patient is being admitted for surgery, pain control, PT, prophylactic antibiotics, VTE prophylaxis, progressive ambulation, ADL's and discharge planning. She will spend the night in observation.   Dental prophylaxis discussed and recommended for 2 years postoperatively.  The patient does meet the criteria for TXA which will be used perioperatively.   Xarelto 10mg  daily will be used postoperatively for DVT prophylaxis in addition to SCDs, and early ambulation. Plan for Hydrocodone and Tylenol for pain. Allergic to NSAIDs. Robaxin for muscle spasm.  Zofran for nausea and vomiting. Pharmacy- [facility] on [address] in [place] The patient is planning to be discharged home with OPPT and into the care of her husband Deidre Ala who can be reached at [phone] Follow up appt 03/28/21 at 12:45pm     Alisa Graff Office [phone] 03/03/2021 6:26 PM

## 2021-03-04 NOTE — Progress Notes (Signed)
Anesthesia Chart Review:   Case: [id number] Date/Time: 03/15/21 1235   Procedure: TOTAL HIP ARTHROPLASTY ANTERIOR APPROACH (Left: Hip)   Anesthesia type: Choice   Pre-op diagnosis: OA LEFT HIP   [facility] 08 / [facility]   Surgeons: [name], MD       DISCUSSION: Pt is 81 years old with hx CAD (calcium on CT in 2017, normal stress test 2017), RBBB, HTN, carotid artery disease, DM, DVT, COPD, thrombocytosis  VS: BP 137/77    Pulse 84    Temp 36.9 C (Oral)    Resp 16    Ht 5\' 4"  (1.626 m)    Wt 66.3 kg    LMP  (LMP Unknown)    SpO2 98%    BMI 25.10 kg/m   PROVIDERS: - PCP is Deland Pretty, MD - Cardiologist is Lauree Chandler, MD who cleared pt for surgery at last office visit 01/04/21   LABS: Labs reviewed: Acceptable for surgery. (all labs ordered are listed, but only abnormal results are displayed)  Labs Reviewed  HEMOGLOBIN A1C - Abnormal; Notable for the following components:      Result Value   Hgb A1c MFr Bld 7.2 (*)    All other components within normal limits  CBC - Abnormal; Notable for the following components:   Hemoglobin 15.1 (*)    HCT 46.4 (*)    Platelets 462 (*)    All other components within normal limits  SURGICAL PCR SCREEN  BASIC METABOLIC PANEL  GLUCOSE, CAPILLARY  TYPE AND SCREEN     IMAGES: CXR 09/24/20:  1. Persistent left pleural effusion and lower lobe airspace disease. 2. Minimal increased airspace opacity at the right base.   EKG 09/24/20: Sinus tachycardia. LAD, consider LAFB. Borderline low voltage, extremity leads. RSR' in V1 or V2, right VCD or RVH. Borderline prolonged QT interval   CV: Carotid duplex 08/07/17:  - Right Carotid: Velocities in the right ICA are consistent with a 1-39% stenosis. Non-hemodynamically significant plaque <50% noted in the CCA. The RICA velocities remain within normal range and essentially stable compared to the prior exam.  - Left Carotid: Velocities in the left ICA are consistent with a  1-39% stenosis. Non-hemodynamically significant plaque noted in the CCA. The LICA velocities remain within normal range and have decreased compared to the prior exam.  - Vertebrals:  Bilateral vertebral arteries demonstrate antegrade flow.  - Subclavians: Normal flow hemodynamics were seen in bilateral subclavian arteries.     Past Medical History:  Diagnosis Date   Anemia    history only at age 89 yrs olds, no current problems   Aortic atherosclerosis ([place])    Arthritis    oa   Blood transfusion 1948   Blood transfusion without reported diagnosis    Breast cancer ([place]) 1994   bilateral / HX skin cancer   Carotid atherosclerosis    Cataract    both eyes   Constipation    COPD (chronic obstructive pulmonary disease) (HCC)    no meds/no inaler   Depression    Diabetes ([place])    type 2 - diet controlled, no meds   Disc disease, degenerative, cervical    trouble turing head and neck at times   Diverticulosis    DVT (deep venous thrombosis) ([place]) 2002   left leg   Dyspnea    occasional with exertion   Dysrhythmia    Family history of adverse reaction to anesthesia    Fibromyalgia    GAD (generalized  anxiety disorder)    GERD (gastroesophageal reflux disease)    History of kidney stones    History of vertigo    Hx of skin cancer, basal cell    Hyperlipidemia    Hypertension    Measles as child   Mumps age 74   Osteoarthritis    Osteoporosis    Pancreatic cyst    Pancreatitis    Pneumonia    PONV (postoperative nausea and vomiting)    PONV   Pulmonary nodule    Right renal mass    Seborrheic keratosis    Sinus tachycardia    Sinus tachycardia    Tubular adenoma of colon     Past Surgical History:  Procedure Laterality Date   ABDOMINAL HYSTERECTOMY     achilles tendon adn 2 bone spurs removed Left 05/2012   APPENDECTOMY     BACK SURGERY  1976   lower   BASAL CELL CARCINOMA EXCISION     bilateral mastectomy with tramflap reconstruction  1994   BIOPSY   05/13/2020   Procedure: BIOPSY;  Surgeon: Irving Copas., MD;  Location: [facility];  Service: Gastroenterology;;   BREAST REDUCTION SURGERY     CARPAL TUNNEL RELEASE Right 03/2017   CATARACT EXTRACTION     COLONOSCOPY     ESOPHAGOGASTRODUODENOSCOPY (EGD) WITH PROPOFOL N/A 05/13/2020   Procedure: ESOPHAGOGASTRODUODENOSCOPY (EGD) WITH PROPOFOL;  Surgeon: Irving Copas., MD;  [facility]: [facility];  Service: Gastroenterology;  Laterality: N/A;   EUS N/A 05/13/2020   Procedure: UPPER ENDOSCOPIC ULTRASOUND (EUS) RADIAL;  Surgeon: Irving Copas., MD;  Location: [facility];  Service: Gastroenterology;  Laterality: N/A;   FOOT SURGERY     KNEE ARTHROSCOPY Right 2008   KNEE SURGERY  2002   arthroscopy-bilateral   LAPAROSCOPIC LIVER ULTRASOUND N/A 08/30/2020   Procedure: LAPAROSCOPIC INTRAOPERATIVE ULTRASOUND;  Surgeon: Dwan Bolt, MD;  Location: [facility];  Service: General;  Laterality: N/A;   LAPAROSCOPIC SPLENECTOMY N/A 08/30/2020   Procedure: LAPAROSCOPIC SPLENECTOMY;  Surgeon: Dwan Bolt, MD;  Location: [facility];  Service: General;  Laterality: N/A;   MASTECTOMY     bilateral   PANCREATECTOMY N/A 08/30/2020   Procedure: LAPAROSCOPIC DISTAL PANCREATECTOMY;  Surgeon: Dwan Bolt, MD;  Location: [facility];  Service: General;  Laterality: N/A;   PARTIAL HYSTERECTOMY     POLYPECTOMY     SHOULDER SURGERY Left    TONSILLECTOMY     TOTAL KNEE ARTHROPLASTY Left 10/14/2012   Procedure: LEFT TOTAL KNEE ARTHROPLASTY;  Surgeon: Gearlean Alf, MD;  [facility];  Service: Orthopedics;  Laterality: Left;   TOTAL KNEE ARTHROPLASTY Right 10/20/2013   Procedure: RIGHT TOTAL KNEE ARTHROPLASTY;  Surgeon: Gearlean Alf, MD;  [facility];  Service: Orthopedics;  Laterality: Right;    MEDICATIONS:  acetaminophen (TYLENOL) 325 MG tablet   aspirin EC 81 MG tablet   Calcium Carbonate-Vit D-Min (CALCIUM 1200 PO)   cholecalciferol (VITAMIN D3) 25 MCG (1000 UNIT)  tablet   denosumab (PROLIA) 60 MG/ML SOSY injection   docusate sodium (COLACE) 100 MG capsule   Evolocumab (REPATHA SURECLICK) 762 MG/ML SOAJ   metFORMIN (GLUCOPHAGE-XR) 500 MG 24 hr tablet   metoprolol tartrate (LOPRESSOR) 50 MG tablet   Multiple Vitamin (MULTIVITAMIN) tablet   pantoprazole (PROTONIX) 40 MG tablet   No current facility-administered medications for this encounter.    If no changes, I anticipate pt can proceed with surgery as scheduled.   Willeen Cass, PhD, FNP-BC [facility]/Anesthesiology  Phone: [phone] 03/04/2021 9:07 AM

## 2021-03-04 NOTE — Anesthesia Preprocedure Evaluation (Addendum)
Anesthesia Evaluation  Patient identified by MRN, date of birth, ID band Patient awake    Reviewed: Allergy & Precautions, NPO status , Patient's Chart, lab work & pertinent test results, reviewed documented beta blocker date and time   History of Anesthesia Complications (+) PONV and history of anesthetic complications (with every general anesthetic)  Airway Mallampati: III  TM Distance: >3 FB Neck ROM: Full    Dental  (+) Teeth Intact, Dental Advisory Given   Pulmonary shortness of breath and with exertion, COPD (no meds), former smoker,  Quit smoking 1994, 20 pack year history    Pulmonary exam normal breath sounds clear to auscultation       Cardiovascular hypertension, Pt. on home beta blockers and Pt. on medications + CAD (calcium on CT in 2017, normal stress test 2017)  Normal cardiovascular exam+ dysrhythmias (RBBB)  Rhythm:Regular Rate:Normal     Neuro/Psych PSYCHIATRIC DISORDERS Anxiety Depression negative neurological ROS     GI/Hepatic Neg liver ROS, GERD  Medicated and Controlled,  Endo/Other  diabetes, Well Controlled, Type 2, Oral Hypoglycemic Agentsa1c 7.2 FS in preop 123   Renal/GU negative Renal ROS  negative genitourinary   Musculoskeletal  (+) Arthritis , Osteoarthritis,  Fibromyalgia -Back surgery-    Abdominal   Peds  Hematology  (+) Blood dyscrasia, anemia , hct 46.4, plt 462   Anesthesia Other Findings   Reproductive/Obstetrics negative OB ROS                          Anesthesia Physical Anesthesia Plan  ASA: 3  Anesthesia Plan: MAC and Spinal   Post-op Pain Management: Tylenol PO (pre-op)   Induction:   PONV Risk Score and Plan: 2 and Propofol infusion and TIVA  Airway Management Planned: Natural Airway and Simple Face Mask  Additional Equipment: None  Intra-op Plan:   Post-operative Plan:   Informed Consent: I have reviewed the patients History and  Physical, chart, labs and discussed the procedure including the risks, benefits and alternatives for the proposed anesthesia with the patient or authorized representative who has indicated his/her understanding and acceptance.       Plan Discussed with: CRNA  Anesthesia Plan Comments: (Had spinal 2015 for TKR without issue)       Anesthesia Quick Evaluation

## 2021-03-07 ENCOUNTER — Ambulatory Visit (HOSPITAL_COMMUNITY): Payer: Medicare Other | Admitting: Hematology

## 2021-03-08 NOTE — Care Plan (Signed)
Ortho Bundle Case Management Note  Patient Details  Name: Renee [name] MRN: [id number] Date of Birth: Mar 28, 1940    Spoke with patient prior to surgery. She will discharge to home with family to assist. Has equipment at home. OPPT set up with [facility]. Patient and MD in agreement with plan. Choice offered                  DME Arranged:    DME Agency:     HH Arranged:    HH Agency:     Additional Comments: Please contact me with any questions of if this plan should need to change.  Ladell Heads,  [facility]  [phone] 03/08/2021, 11:14 AM

## 2021-03-11 ENCOUNTER — Other Ambulatory Visit: Payer: Self-pay

## 2021-03-11 ENCOUNTER — Encounter (HOSPITAL_COMMUNITY)
Admission: RE | Admit: 2021-03-11 | Discharge: 2021-03-11 | Disposition: A | Discharge status: Home / Self Care | Payer: Medicare Other | Source: Ambulatory Visit | Attending: Orthopedic Surgery | Admitting: Orthopedic Surgery

## 2021-03-11 DIAGNOSIS — Z01812 Encounter for preprocedural laboratory examination: Secondary | ICD-10-CM | POA: Diagnosis not present

## 2021-03-11 DIAGNOSIS — Z20822 Contact with and (suspected) exposure to covid-19: Secondary | ICD-10-CM | POA: Diagnosis not present

## 2021-03-11 LAB — SARS CORONAVIRUS 2 (TAT 6-24 HRS): SARS Coronavirus 2: NEGATIVE

## 2021-03-14 ENCOUNTER — Other Ambulatory Visit: Payer: Self-pay

## 2021-03-14 DIAGNOSIS — I10 Essential (primary) hypertension: Secondary | ICD-10-CM

## 2021-03-14 MED ORDER — METOPROLOL TARTRATE 50 MG PO TABS: ORAL_TABLET | ORAL | 3 refills | Status: DC

## 2021-03-14 NOTE — Telephone Encounter (Signed)
Pt's medication was sent to pt's pharmacy as requested. Confirmation received.  °

## 2021-03-15 ENCOUNTER — Observation Stay (HOSPITAL_COMMUNITY)
Admission: RE | Admit: 2021-03-15 | Discharge: 2021-03-16 | Disposition: A | Discharge status: Home / Self Care | Payer: Medicare Other | Attending: Orthopedic Surgery | Admitting: Orthopedic Surgery

## 2021-03-15 ENCOUNTER — Encounter (HOSPITAL_COMMUNITY)
Admission: RE | Disposition: A | Discharge status: Home / Self Care | Payer: Self-pay | Source: Home / Self Care | Attending: Orthopedic Surgery

## 2021-03-15 ENCOUNTER — Encounter (HOSPITAL_COMMUNITY): Payer: Self-pay | Admitting: Orthopedic Surgery

## 2021-03-15 ENCOUNTER — Ambulatory Visit (HOSPITAL_COMMUNITY): Payer: Medicare Other

## 2021-03-15 ENCOUNTER — Other Ambulatory Visit: Payer: Self-pay

## 2021-03-15 ENCOUNTER — Ambulatory Visit (HOSPITAL_COMMUNITY): Payer: Medicare Other | Admitting: Emergency Medicine

## 2021-03-15 ENCOUNTER — Ambulatory Visit (HOSPITAL_BASED_OUTPATIENT_CLINIC_OR_DEPARTMENT_OTHER): Payer: Medicare Other | Admitting: Anesthesiology

## 2021-03-15 DIAGNOSIS — Z419 Encounter for procedure for purposes other than remedying health state, unspecified: Secondary | ICD-10-CM

## 2021-03-15 DIAGNOSIS — Z7982 Long term (current) use of aspirin: Secondary | ICD-10-CM | POA: Diagnosis not present

## 2021-03-15 DIAGNOSIS — Z96653 Presence of artificial knee joint, bilateral: Secondary | ICD-10-CM | POA: Insufficient documentation

## 2021-03-15 DIAGNOSIS — I2510 Atherosclerotic heart disease of native coronary artery without angina pectoris: Secondary | ICD-10-CM

## 2021-03-15 DIAGNOSIS — Z85828 Personal history of other malignant neoplasm of skin: Secondary | ICD-10-CM | POA: Insufficient documentation

## 2021-03-15 DIAGNOSIS — M1612 Unilateral primary osteoarthritis, left hip: Secondary | ICD-10-CM

## 2021-03-15 DIAGNOSIS — E119 Type 2 diabetes mellitus without complications: Secondary | ICD-10-CM | POA: Diagnosis not present

## 2021-03-15 DIAGNOSIS — Z79899 Other long term (current) drug therapy: Secondary | ICD-10-CM | POA: Insufficient documentation

## 2021-03-15 DIAGNOSIS — J449 Chronic obstructive pulmonary disease, unspecified: Secondary | ICD-10-CM | POA: Insufficient documentation

## 2021-03-15 DIAGNOSIS — E782 Mixed hyperlipidemia: Secondary | ICD-10-CM

## 2021-03-15 DIAGNOSIS — Z96642 Presence of left artificial hip joint: Secondary | ICD-10-CM | POA: Diagnosis present

## 2021-03-15 DIAGNOSIS — Z471 Aftercare following joint replacement surgery: Secondary | ICD-10-CM | POA: Diagnosis not present

## 2021-03-15 DIAGNOSIS — Z87891 Personal history of nicotine dependence: Secondary | ICD-10-CM | POA: Diagnosis not present

## 2021-03-15 DIAGNOSIS — I10 Essential (primary) hypertension: Secondary | ICD-10-CM

## 2021-03-15 DIAGNOSIS — Z7984 Long term (current) use of oral hypoglycemic drugs: Secondary | ICD-10-CM | POA: Insufficient documentation

## 2021-03-15 DIAGNOSIS — Z20822 Contact with and (suspected) exposure to covid-19: Secondary | ICD-10-CM | POA: Diagnosis not present

## 2021-03-15 DIAGNOSIS — Z853 Personal history of malignant neoplasm of breast: Secondary | ICD-10-CM | POA: Diagnosis not present

## 2021-03-15 DIAGNOSIS — Z01812 Encounter for preprocedural laboratory examination: Secondary | ICD-10-CM

## 2021-03-15 HISTORY — PX: TOTAL HIP ARTHROPLASTY: SHX124

## 2021-03-15 LAB — GLUCOSE, CAPILLARY
Glucose-Capillary: 123 mg/dL — ABNORMAL HIGH (ref 70–99)
Glucose-Capillary: 119 mg/dL — ABNORMAL HIGH (ref 70–99)

## 2021-03-15 SURGERY — ARTHROPLASTY, HIP, TOTAL, ANTERIOR APPROACH
Anesthesia: Monitor Anesthesia Care | Site: Hip | Laterality: Left

## 2021-03-15 MED ORDER — PHENOL 1.4 % MT LIQD: 1.0000 | OROMUCOSAL | Status: DC | PRN

## 2021-03-15 MED ORDER — DEXAMETHASONE SODIUM PHOSPHATE 10 MG/ML IJ SOLN
10.0000 mg | Freq: Once | INTRAMUSCULAR | Status: AC
  Administered 2021-03-16: 10 mg via INTRAVENOUS
  Filled 2021-03-15: qty 1

## 2021-03-15 MED ORDER — MENTHOL 3 MG MT LOZG: 1.0000 | LOZENGE | OROMUCOSAL | Status: DC | PRN

## 2021-03-15 MED ORDER — FENTANYL CITRATE PF 50 MCG/ML IJ SOSY
PREFILLED_SYRINGE | INTRAMUSCULAR | Status: AC
  Filled 2021-03-15: qty 1

## 2021-03-15 MED ORDER — ONDANSETRON HCL 4 MG/2ML IJ SOLN: 4.0000 mg | Freq: Once | INTRAMUSCULAR | Status: DC | PRN

## 2021-03-15 MED ORDER — METOCLOPRAMIDE HCL 5 MG PO TABS: 5.0000 mg | ORAL_TABLET | Freq: Three times a day (TID) | ORAL | Status: DC | PRN

## 2021-03-15 MED ORDER — POLYETHYLENE GLYCOL 3350 17 G PO PACK: 17.0000 g | PACK | Freq: Every day | ORAL | Status: DC | PRN

## 2021-03-15 MED ORDER — BUPIVACAINE LIPOSOME 1.3 % IJ SUSP
INTRAMUSCULAR | Status: DC | PRN
  Administered 2021-03-15: 10 mL

## 2021-03-15 MED ORDER — SODIUM CHLORIDE FLUSH 0.9 % IV SOLN
INTRAVENOUS | Status: DC | PRN
  Administered 2021-03-15: 10 mL

## 2021-03-15 MED ORDER — METOCLOPRAMIDE HCL 5 MG/ML IJ SOLN
5.0000 mg | Freq: Three times a day (TID) | INTRAMUSCULAR | Status: DC | PRN
  Administered 2021-03-15 – 2021-03-16 (×2): 10 mg via INTRAVENOUS
  Filled 2021-03-15 (×2): qty 2

## 2021-03-15 MED ORDER — MORPHINE SULFATE (PF) 2 MG/ML IV SOLN
INTRAVENOUS | Status: AC
  Filled 2021-03-15: qty 1

## 2021-03-15 MED ORDER — ACETAMINOPHEN 500 MG PO TABS
1000.0000 mg | ORAL_TABLET | Freq: Once | ORAL | Status: AC
  Administered 2021-03-15: 1000 mg via ORAL
  Filled 2021-03-15: qty 2

## 2021-03-15 MED ORDER — CELECOXIB 200 MG PO CAPS
200.0000 mg | ORAL_CAPSULE | Freq: Two times a day (BID) | ORAL | Status: DC
  Administered 2021-03-15: 200 mg via ORAL
  Filled 2021-03-15 (×2): qty 1

## 2021-03-15 MED ORDER — ACETAMINOPHEN 325 MG PO TABS: 325.0000 mg | ORAL_TABLET | Freq: Four times a day (QID) | ORAL | Status: DC | PRN

## 2021-03-15 MED ORDER — MORPHINE SULFATE (PF) 2 MG/ML IV SOLN
0.5000 mg | INTRAVENOUS | Status: DC | PRN
  Administered 2021-03-15: 1 mg via INTRAVENOUS

## 2021-03-15 MED ORDER — ALUM & MAG HYDROXIDE-SIMETH 200-200-20 MG/5ML PO SUSP: 30.0000 mL | ORAL | Status: DC | PRN

## 2021-03-15 MED ORDER — HYDROCODONE-ACETAMINOPHEN 5-325 MG PO TABS
1.0000 | ORAL_TABLET | ORAL | Status: DC | PRN
  Administered 2021-03-15: 2 via ORAL
  Filled 2021-03-15: qty 2

## 2021-03-15 MED ORDER — ONDANSETRON HCL 4 MG/2ML IJ SOLN
INTRAMUSCULAR | Status: AC
  Filled 2021-03-15: qty 2

## 2021-03-15 MED ORDER — ASPIRIN 81 MG PO CHEW
81.0000 mg | CHEWABLE_TABLET | Freq: Two times a day (BID) | ORAL | Status: DC
  Administered 2021-03-15 – 2021-03-16 (×2): 81 mg via ORAL
  Filled 2021-03-15 (×2): qty 1

## 2021-03-15 MED ORDER — ACETAMINOPHEN 500 MG PO TABS
500.0000 mg | ORAL_TABLET | Freq: Four times a day (QID) | ORAL | Status: DC
  Administered 2021-03-16 (×3): 500 mg via ORAL
  Filled 2021-03-15 (×3): qty 1

## 2021-03-15 MED ORDER — BISACODYL 10 MG RE SUPP: 10.0000 mg | Freq: Every day | RECTAL | Status: DC | PRN

## 2021-03-15 MED ORDER — METFORMIN HCL ER 500 MG PO TB24
500.0000 mg | ORAL_TABLET | Freq: Two times a day (BID) | ORAL | Status: DC
  Administered 2021-03-16: 500 mg via ORAL
  Filled 2021-03-15: qty 1

## 2021-03-15 MED ORDER — PANTOPRAZOLE SODIUM 40 MG PO TBEC: 40.0000 mg | DELAYED_RELEASE_TABLET | Freq: Every day | ORAL | Status: DC

## 2021-03-15 MED ORDER — LIDOCAINE HCL (CARDIAC) PF 100 MG/5ML IV SOSY
PREFILLED_SYRINGE | INTRAVENOUS | Status: DC | PRN
  Administered 2021-03-15: 40 mL via INTRATRACHEAL

## 2021-03-15 MED ORDER — POVIDONE-IODINE 10 % EX SWAB
2.0000 "application " | Freq: Once | CUTANEOUS | Status: AC
  Administered 2021-03-15: 2 via TOPICAL

## 2021-03-15 MED ORDER — LACTATED RINGERS IV SOLN: INTRAVENOUS | Status: DC

## 2021-03-15 MED ORDER — ONDANSETRON HCL 4 MG/2ML IJ SOLN
INTRAMUSCULAR | Status: DC | PRN
  Administered 2021-03-15: 4 mg via INTRAVENOUS

## 2021-03-15 MED ORDER — ACETAMINOPHEN 500 MG PO TABS: 1000.0000 mg | ORAL_TABLET | Freq: Once | ORAL | Status: DC

## 2021-03-15 MED ORDER — OXYCODONE HCL 5 MG PO TABS
ORAL_TABLET | ORAL | Status: AC
  Filled 2021-03-15: qty 1

## 2021-03-15 MED ORDER — TRANEXAMIC ACID-NACL 1000-0.7 MG/100ML-% IV SOLN
1000.0000 mg | Freq: Once | INTRAVENOUS | Status: AC
  Administered 2021-03-15: 1000 mg via INTRAVENOUS
  Filled 2021-03-15: qty 100

## 2021-03-15 MED ORDER — CHLORHEXIDINE GLUCONATE 0.12 % MT SOLN
15.0000 mL | Freq: Once | OROMUCOSAL | Status: AC
  Administered 2021-03-15: 15 mL via OROMUCOSAL

## 2021-03-15 MED ORDER — BUPIVACAINE LIPOSOME 1.3 % IJ SUSP
INTRAMUSCULAR | Status: AC
  Filled 2021-03-15: qty 10

## 2021-03-15 MED ORDER — DIPHENHYDRAMINE HCL 12.5 MG/5ML PO ELIX: 12.5000 mg | ORAL_SOLUTION | ORAL | Status: DC | PRN

## 2021-03-15 MED ORDER — HYDROCODONE-ACETAMINOPHEN 7.5-325 MG PO TABS
1.0000 | ORAL_TABLET | ORAL | Status: DC | PRN
  Administered 2021-03-15: 1 via ORAL
  Filled 2021-03-15: qty 2

## 2021-03-15 MED ORDER — DOCUSATE SODIUM 100 MG PO CAPS
100.0000 mg | ORAL_CAPSULE | Freq: Two times a day (BID) | ORAL | Status: DC
  Administered 2021-03-15 – 2021-03-16 (×2): 100 mg via ORAL
  Filled 2021-03-15 (×2): qty 1

## 2021-03-15 MED ORDER — POVIDONE-IODINE 10 % EX SWAB
2.0000 | Freq: Once | CUTANEOUS | Status: DC
Start: 2021-03-15 — End: 2021-03-15

## 2021-03-15 MED ORDER — 0.9 % SODIUM CHLORIDE (POUR BTL) OPTIME
TOPICAL | Status: DC | PRN
  Administered 2021-03-15: 1000 mL

## 2021-03-15 MED ORDER — DEXAMETHASONE SODIUM PHOSPHATE 10 MG/ML IJ SOLN
INTRAMUSCULAR | Status: AC
  Filled 2021-03-15: qty 1

## 2021-03-15 MED ORDER — METHOCARBAMOL 500 MG IVPB - SIMPLE MED
INTRAVENOUS | Status: AC
  Filled 2021-03-15: qty 50

## 2021-03-15 MED ORDER — METOPROLOL TARTRATE 50 MG PO TABS
50.0000 mg | ORAL_TABLET | Freq: Two times a day (BID) | ORAL | Status: DC
  Administered 2021-03-15 – 2021-03-16 (×2): 50 mg via ORAL
  Filled 2021-03-15 (×2): qty 1

## 2021-03-15 MED ORDER — PROPOFOL 10 MG/ML IV BOLUS
INTRAVENOUS | Status: DC | PRN
  Administered 2021-03-15: 20 mg via INTRAVENOUS

## 2021-03-15 MED ORDER — FENTANYL CITRATE PF 50 MCG/ML IJ SOSY
25.0000 ug | PREFILLED_SYRINGE | INTRAMUSCULAR | Status: DC | PRN
  Administered 2021-03-15 (×3): 50 ug via INTRAVENOUS

## 2021-03-15 MED ORDER — PANTOPRAZOLE SODIUM 40 MG PO TBEC
40.0000 mg | DELAYED_RELEASE_TABLET | Freq: Two times a day (BID) | ORAL | Status: DC
  Administered 2021-03-16: 40 mg via ORAL
  Filled 2021-03-15: qty 1

## 2021-03-15 MED ORDER — BUPIVACAINE LIPOSOME 1.3 % IJ SUSP
10.0000 mL | Freq: Once | INTRAMUSCULAR | Status: AC
Start: 2021-03-15 — End: 2021-03-15

## 2021-03-15 MED ORDER — ADULT MULTIVITAMIN W/MINERALS CH: 1.0000 | ORAL_TABLET | Freq: Every day | ORAL | Status: DC

## 2021-03-15 MED ORDER — METHOCARBAMOL 500 MG PO TABS
500.0000 mg | ORAL_TABLET | Freq: Four times a day (QID) | ORAL | Status: DC | PRN
  Administered 2021-03-16: 500 mg via ORAL
  Filled 2021-03-15: qty 1

## 2021-03-15 MED ORDER — WATER FOR IRRIGATION, STERILE IR SOLN
Status: DC | PRN
  Administered 2021-03-15: 2000 mL

## 2021-03-15 MED ORDER — METHOCARBAMOL 500 MG IVPB - SIMPLE MED
500.0000 mg | Freq: Four times a day (QID) | INTRAVENOUS | Status: DC | PRN
  Administered 2021-03-15: 500 mg via INTRAVENOUS
  Filled 2021-03-15: qty 50

## 2021-03-15 MED ORDER — OXYCODONE HCL 5 MG PO TABS
5.0000 mg | ORAL_TABLET | Freq: Once | ORAL | Status: AC | PRN
  Administered 2021-03-15: 5 mg via ORAL

## 2021-03-15 MED ORDER — PHENYLEPHRINE HCL-NACL 20-0.9 MG/250ML-% IV SOLN
INTRAVENOUS | Status: DC | PRN
Start: 2021-03-15 — End: 2021-03-15
  Administered 2021-03-15: 35 ug/min via INTRAVENOUS

## 2021-03-15 MED ORDER — PROPOFOL 500 MG/50ML IV EMUL
INTRAVENOUS | Status: DC | PRN
  Administered 2021-03-15: 35 ug/kg/min via INTRAVENOUS

## 2021-03-15 MED ORDER — DEXAMETHASONE SODIUM PHOSPHATE 10 MG/ML IJ SOLN
8.0000 mg | Freq: Once | INTRAMUSCULAR | Status: AC
  Administered 2021-03-15: 8 mg via INTRAVENOUS

## 2021-03-15 MED ORDER — OXYCODONE HCL 5 MG/5ML PO SOLN: 5.0000 mg | Freq: Once | ORAL | Status: AC | PRN

## 2021-03-15 MED ORDER — BUPIVACAINE IN DEXTROSE 0.75-8.25 % IT SOLN
INTRATHECAL | Status: DC | PRN
Start: 2021-03-15 — End: 2021-03-15
  Administered 2021-03-15: 1.6 mL via INTRATHECAL

## 2021-03-15 MED ORDER — CEFAZOLIN SODIUM-DEXTROSE 1-4 GM/50ML-% IV SOLN
1.0000 g | Freq: Four times a day (QID) | INTRAVENOUS | Status: AC
  Administered 2021-03-15 – 2021-03-16 (×2): 1 g via INTRAVENOUS
  Filled 2021-03-15 (×2): qty 50

## 2021-03-15 MED ORDER — CEFAZOLIN SODIUM-DEXTROSE 2-4 GM/100ML-% IV SOLN
2.0000 g | INTRAVENOUS | Status: AC
  Administered 2021-03-15: 2 g via INTRAVENOUS
  Filled 2021-03-15: qty 100

## 2021-03-15 MED ORDER — TRANEXAMIC ACID-NACL 1000-0.7 MG/100ML-% IV SOLN
1000.0000 mg | INTRAVENOUS | Status: AC
  Administered 2021-03-15: 1000 mg via INTRAVENOUS
  Filled 2021-03-15: qty 100

## 2021-03-15 MED ORDER — PROPOFOL 1000 MG/100ML IV EMUL
INTRAVENOUS | Status: AC
  Filled 2021-03-15: qty 100

## 2021-03-15 MED ORDER — SODIUM CHLORIDE (PF) 0.9 % IJ SOLN
INTRAMUSCULAR | Status: AC
  Filled 2021-03-15: qty 10

## 2021-03-15 MED ORDER — ORAL CARE MOUTH RINSE: 15.0000 mL | Freq: Once | OROMUCOSAL | Status: AC

## 2021-03-15 SURGICAL SUPPLY — 50 items
APL PRP STRL LF DISP 70% ISPRP (MISCELLANEOUS) ×1
BAG COUNTER SPONGE SURGICOUNT (BAG) ×1 IMPLANT
BAG SPEC THK2 15X12 ZIP CLS (MISCELLANEOUS) ×1
BAG SPNG CNTER NS LX DISP (BAG) ×1
BAG ZIPLOCK 12X15 (MISCELLANEOUS) ×1 IMPLANT
BLADE SAG 18X100X1.27 (BLADE) ×3 IMPLANT
BLADE SURG SZ10 CARB STEEL (BLADE) ×3 IMPLANT
CHLORAPREP W/TINT 26 (MISCELLANEOUS) ×3 IMPLANT
CLSR STERI-STRIP ANTIMIC 1/2X4 (GAUZE/BANDAGES/DRESSINGS) ×3 IMPLANT
COVER PERINEAL POST (MISCELLANEOUS) ×3 IMPLANT
COVER SURGICAL LIGHT HANDLE (MISCELLANEOUS) ×3 IMPLANT
DRAPE IMP U-DRAPE 54X76 (DRAPES) ×3 IMPLANT
DRAPE STERI IOBAN 125X83 (DRAPES) ×3 IMPLANT
DRAPE U-SHAPE 47X51 STRL (DRAPES) ×6 IMPLANT
DRSG MEPILEX BORDER 4X8 (GAUZE/BANDAGES/DRESSINGS) ×3 IMPLANT
ELECT REM PT RETURN 15FT ADLT (MISCELLANEOUS) ×3 IMPLANT
GLOVE SRG 8 PF TXTR STRL LF DI (GLOVE) ×2 IMPLANT
GLOVE SURG ENC MOIS LTX SZ7.5 (GLOVE) ×3 IMPLANT
GLOVE SURG POLYISO LF SZ7.5 (GLOVE) ×3 IMPLANT
GLOVE SURG SYN 7.5  E (GLOVE) ×2
GLOVE SURG SYN 7.5 E (GLOVE) ×1 IMPLANT
GLOVE SURG SYN 7.5 PF PI (GLOVE) ×2 IMPLANT
GLOVE SURG UNDER POLY LF SZ7.5 (GLOVE) ×3 IMPLANT
GLOVE SURG UNDER POLY LF SZ8 (GLOVE) ×2
GOWN STRL REUS W/TWL LRG LVL3 (GOWN DISPOSABLE) ×3 IMPLANT
GOWN STRL REUS W/TWL XL LVL3 (GOWN DISPOSABLE) ×3 IMPLANT
HEAD BIOLOX HIP 36/-5 (Joint) IMPLANT
HIP BIOLOX HD 36/-5 (Joint) ×2 IMPLANT
HOLDER FOLEY CATH W/STRAP (MISCELLANEOUS) ×1 IMPLANT
INSERT 0 DEGREE 36 (Miscellaneous) ×1 IMPLANT
KIT TURNOVER KIT A (KITS) IMPLANT
MANIFOLD NEPTUNE II (INSTRUMENTS) ×3 IMPLANT
NS IRRIG 1000ML POUR BTL (IV SOLUTION) ×3 IMPLANT
PACK ANTERIOR HIP CUSTOM (KITS) ×3 IMPLANT
PROTECTOR NERVE ULNAR (MISCELLANEOUS) ×3 IMPLANT
SCREW HEX LP 6.5X20 (Screw) ×1 IMPLANT
SHELL TRIDENT II CLUST 50 (Shell) ×1 IMPLANT
SPIKE FLUID TRANSFER (MISCELLANEOUS) ×4 IMPLANT
SPONGE T-LAP 18X18 ~~LOC~~+RFID (SPONGE) ×8 IMPLANT
STEM HIP 127 DEG (Stem) ×1 IMPLANT
STRIP CLOSURE SKIN 1/2X4 (GAUZE/BANDAGES/DRESSINGS) ×1 IMPLANT
SUT MNCRL AB 3-0 PS2 18 (SUTURE) ×3 IMPLANT
SUT VIC AB 0 CT1 36 (SUTURE) ×3 IMPLANT
SUT VIC AB 1 CT1 36 (SUTURE) ×3 IMPLANT
SUT VIC AB 2-0 CT1 27 (SUTURE) ×4
SUT VIC AB 2-0 CT1 TAPERPNT 27 (SUTURE) ×4 IMPLANT
TRAY FOLEY MTR SLVR 14FR STAT (SET/KITS/TRAYS/PACK) ×1 IMPLANT
TRAY FOLEY MTR SLVR 16FR STAT (SET/KITS/TRAYS/PACK) IMPLANT
TUBE SUCTION HIGH CAP CLEAR NV (SUCTIONS) ×3 IMPLANT
WATER STERILE IRR 1000ML POUR (IV SOLUTION) ×6 IMPLANT

## 2021-03-15 NOTE — Transfer of Care (Signed)
Immediate Anesthesia Transfer of Care Note  Patient: Renee [name]  Procedure(s) Performed: TOTAL HIP ARTHROPLASTY ANTERIOR APPROACH (Left: Hip)  Patient Location: PACU  Anesthesia Type:Spinal  Level of Consciousness: drowsy  Airway & Oxygen Therapy: Patient Spontanous Breathing and Patient connected to face mask oxygen  Post-op Assessment: Report given to RN and Post -op Vital signs reviewed and stable  Post vital signs: Reviewed and stable  Last Vitals:  Vitals Value Taken Time  BP 112/52 03/15/21 1439  Temp    Pulse 75 03/15/21 1443  Resp 23 03/15/21 1443  SpO2 100 % 03/15/21 1443  Vitals shown include unvalidated device data.  Last Pain:  Vitals:   03/15/21 1111  TempSrc:   PainSc: 0-No pain         Complications: No notable events documented.

## 2021-03-15 NOTE — Anesthesia Procedure Notes (Signed)
Spinal  Patient location during procedure: OR End time: 03/15/2021 1:01 PM Reason for block: surgical anesthesia Staffing Performed: resident/CRNA  Resident/CRNA: Maxwell Caul, CRNA Preanesthetic Checklist Completed: patient identified, IV checked, site marked, risks and benefits discussed, surgical consent, monitors and equipment checked, pre-op evaluation and timeout performed Spinal Block Patient position: sitting Prep: DuraPrep Patient monitoring: heart rate, cardiac monitor, continuous pulse ox and blood pressure Approach: midline Location: L3-4 Injection technique: single-shot Needle Needle type: Pencan  Needle gauge: 24 G Needle length: 10 cm Assessment Sensory level: T4 Events: CSF return Additional Notes IV functioning, monitors applied to pt. Expiration date of kit checked and confirmed to be in date. Sterile prep and drape, hand hygiene and sterile gloves used. Pt was positioned and spine was prepped in sterile fashion. Skin was anesthetized with lidocaine. Free flow of clear CSF obtained prior to injecting local anesthetic into CSF x 1 attempt. Spinal needle aspirated freely following injection. Needle was carefully withdrawn, and pt tolerated procedure well. Loss of motor and sensory on exam post injection. Dr Doroteo Glassman at bedside during entire procedure.

## 2021-03-15 NOTE — Anesthesia Postprocedure Evaluation (Signed)
Anesthesia Post Note  Patient: Renee [name]  Procedure(s) Performed: TOTAL HIP ARTHROPLASTY ANTERIOR APPROACH (Left: Hip)     Patient location during evaluation: PACU Anesthesia Type: MAC and Spinal Level of consciousness: awake and alert, patient cooperative and oriented Pain management: pain level controlled (pain improving ) Vital Signs Assessment: post-procedure vital signs reviewed and stable Respiratory status: spontaneous breathing, nonlabored ventilation and respiratory function stable Cardiovascular status: blood pressure returned to baseline and stable Postop Assessment: no apparent nausea or vomiting and spinal receding Anesthetic complications: no   No notable events documented.  Last Vitals:  Vitals:   03/15/21 1440 03/15/21 1445  BP: (!) 112/52 128/63  Pulse: 68 72  Resp: (!) 28 (!) 22  Temp: 36.5 C   SpO2: 100% 100%    Last Pain:  Vitals:   03/15/21 1452  TempSrc:   PainSc: 10-Worst pain ever                 Deyton Ellenbecker,E. Scheryl Sanborn

## 2021-03-15 NOTE — Interval H&P Note (Signed)
History and Physical Interval Note:  03/15/2021 10:08 AM  Renee [name]  has presented today for surgery, with the diagnosis of OA LEFT HIP.  The various methods of treatment have been discussed with the patient and family. After consideration of risks, benefits and other options for treatment, the patient has consented to  Procedure(s): TOTAL HIP ARTHROPLASTY ANTERIOR APPROACH (Left) as a surgical intervention.  The patient's history has been reviewed, patient examined, no change in status, stable for surgery.  I have reviewed the patient's chart and labs.  Questions were answered to the patient's satisfaction.     [name]

## 2021-03-15 NOTE — Op Note (Signed)
03/15/2021  2:17 PM  PATIENT:  Renee [name]   MRN: [id number]  PRE-OPERATIVE DIAGNOSIS:  OA LEFT HIP  POST-OPERATIVE DIAGNOSIS:  OA LEFT HIP  PROCEDURE:  Procedure(s): TOTAL HIP ARTHROPLASTY ANTERIOR APPROACH  PREOPERATIVE INDICATIONS:    Renee [name] is an 81 y.o. female who has a diagnosis of <principal problem not specified> and elected for surgical management after failing conservative treatment.  The risks benefits and alternatives were discussed with the patient including but not limited to the risks of nonoperative treatment, versus surgical intervention including infection, bleeding, nerve injury, periprosthetic fracture, the need for revision surgery, dislocation, leg length discrepancy, blood clots, cardiopulmonary complications, morbidity, mortality, among others, and they were willing to proceed.     OPERATIVE REPORT     SURGEON:   [name], MD    ASSISTANT:  Aggie Moats, PA-C, he was present and scrubbed throughout the case, critical for completion in a timely fashion, and for retraction, instrumentation, and closure.     ANESTHESIA:  General    COMPLICATIONS:  None.     COMPONENTS:  [employer]ker acolade fit femur size 5 with a 36 mm -5 head ball and an acetabular shell size 50 with a  polyethylene liner    PROCEDURE IN DETAIL:   The patient was met in the holding area and  identified.  The appropriate hip was identified and marked at the operative site.  The patient was then transported to the OR  and  placed under anesthesia per that record.  At that point, the patient was  placed in the supine position and  secured to the operating room table and all bony prominences padded. He received pre-operative antibiotics    The operative lower extremity was prepped from the iliac crest to the distal leg.  Sterile draping was performed.  Time out was performed prior to incision.      Skin incision was made just 2 cm lateral to the ASIS  extending in line with  the tensor fascia lata. Electrocautery was used to control all bleeders. I dissected down sharply to the fascia of the tensor fascia lata was confirmed that the muscle fibers beneath were running posteriorly. I then incised the fascia over the superficial tensor fascia lata in line with the incision. The fascia was elevated off the anterior aspect of the muscle the muscle was retracted posteriorly and protected throughout the case. I then used electrocautery to incise the tensor fascia lata fascia control and all bleeders. Immediately visible was the fat over top of the anterior neck and capsule.  I removed the anterior fat from the capsule and elevated the rectus muscle off of the anterior capsule. I then removed a large time of capsule. The retractors were then placed over the anterior acetabulum as well as around the superior and inferior neck.  I then made a femoral neck cut. Then used the power corkscrew to remove the femoral head from the acetabulum and thoroughly irrigated the acetabulum. I sized the femoral head.    I then exposed the deep acetabulum, cleared out any tissue including the ligamentum teres.   After adequate visualization, I excised the labrum, and then sequentially reamed.  I then impacted the acetabular implant into place using fluoroscopy for guidance.  Appropriate version and inclination was confirmed clinically matching their bony anatomy, and with fluoroscopy.  I placed a 20 mm screw in the posterior/superio position with an excellent bite.    I then placed the polyethylene liner in  place  I then adducted the leg and released the external rotators from the posterior femur allowing it to be easily delivered up lateral and anterior to the acetabulum for preparation of the femoral canal.    I then prepared the proximal femur using the cookie-cutter and then sequentially reamed and broached.  A trial broach, neck, and head was utilized, and I reduced the hip and used  floroscopy to assess the neck length and femoral implant.  I then impacted the femoral prosthesis into place into the appropriate version. The hip was then reduced and fluoroscopy confirmed appropriate position. Leg lengths were restored.  I then irrigated the hip copiously again with, and repaired the fascia with Vicryl, followed by monocryl for the subcutaneous tissue, Monocryl for the skin, Steri-Strips and sterile gauze. The patient was then awakened and returned to PACU in stable and satisfactory condition. There were no complications.  POST OPERATIVE PLAN: WBAT, DVT px: SCD's/TED, ambulation and chemical dvt px  [name], MD Orthopedic Surgeon [phone]

## 2021-03-15 NOTE — Discharge Instructions (Signed)

## 2021-03-15 NOTE — Anesthesia Procedure Notes (Signed)
Procedure Name: MAC Date/Time: 03/15/2021 12:53 PM Performed by: Maxwell Caul, CRNA Pre-anesthesia Checklist: Patient identified, Emergency Drugs available, Suction available and Patient being monitored Oxygen Delivery Method: Simple face mask

## 2021-03-16 ENCOUNTER — Encounter (HOSPITAL_COMMUNITY): Payer: Self-pay | Admitting: Orthopedic Surgery

## 2021-03-16 DIAGNOSIS — J449 Chronic obstructive pulmonary disease, unspecified: Secondary | ICD-10-CM | POA: Diagnosis not present

## 2021-03-16 DIAGNOSIS — Z7982 Long term (current) use of aspirin: Secondary | ICD-10-CM | POA: Diagnosis not present

## 2021-03-16 DIAGNOSIS — Z87891 Personal history of nicotine dependence: Secondary | ICD-10-CM | POA: Diagnosis not present

## 2021-03-16 DIAGNOSIS — Z96653 Presence of artificial knee joint, bilateral: Secondary | ICD-10-CM | POA: Diagnosis not present

## 2021-03-16 DIAGNOSIS — Z853 Personal history of malignant neoplasm of breast: Secondary | ICD-10-CM | POA: Diagnosis not present

## 2021-03-16 DIAGNOSIS — Z79899 Other long term (current) drug therapy: Secondary | ICD-10-CM | POA: Diagnosis not present

## 2021-03-16 DIAGNOSIS — M1612 Unilateral primary osteoarthritis, left hip: Secondary | ICD-10-CM | POA: Diagnosis not present

## 2021-03-16 DIAGNOSIS — Z85828 Personal history of other malignant neoplasm of skin: Secondary | ICD-10-CM | POA: Diagnosis not present

## 2021-03-16 DIAGNOSIS — E119 Type 2 diabetes mellitus without complications: Secondary | ICD-10-CM | POA: Diagnosis not present

## 2021-03-16 DIAGNOSIS — Z20822 Contact with and (suspected) exposure to covid-19: Secondary | ICD-10-CM | POA: Diagnosis not present

## 2021-03-16 DIAGNOSIS — I10 Essential (primary) hypertension: Secondary | ICD-10-CM | POA: Diagnosis not present

## 2021-03-16 DIAGNOSIS — Z7984 Long term (current) use of oral hypoglycemic drugs: Secondary | ICD-10-CM | POA: Diagnosis not present

## 2021-03-16 MED ORDER — SODIUM CHLORIDE 0.9 % IV SOLN
12.5000 mg | Freq: Four times a day (QID) | INTRAVENOUS | Status: DC | PRN
  Filled 2021-03-16: qty 0.5

## 2021-03-16 MED ORDER — ASPIRIN EC 81 MG PO TBEC: 81.0000 mg | DELAYED_RELEASE_TABLET | Freq: Two times a day (BID) | ORAL | 0 refills | Status: DC

## 2021-03-16 MED ORDER — ONDANSETRON HCL 4 MG/2ML IJ SOLN
4.0000 mg | Freq: Once | INTRAMUSCULAR | Status: AC
  Administered 2021-03-16: 4 mg via INTRAVENOUS
  Filled 2021-03-16: qty 2

## 2021-03-16 MED ORDER — ACETAMINOPHEN 500 MG PO TABS: 1000.0000 mg | ORAL_TABLET | Freq: Three times a day (TID) | ORAL | 0 refills | Status: DC | PRN

## 2021-03-16 MED ORDER — METHOCARBAMOL 750 MG PO TABS: 750.0000 mg | ORAL_TABLET | Freq: Three times a day (TID) | ORAL | 0 refills | Status: DC | PRN

## 2021-03-16 MED ORDER — PROMETHAZINE HCL 25 MG PO TABS: 12.5000 mg | ORAL_TABLET | Freq: Four times a day (QID) | ORAL | Status: DC | PRN

## 2021-03-16 MED ORDER — PROMETHAZINE HCL 12.5 MG RE SUPP
12.5000 mg | Freq: Four times a day (QID) | RECTAL | Status: DC | PRN
  Filled 2021-03-16: qty 1

## 2021-03-16 MED ORDER — HYDROCODONE-ACETAMINOPHEN 10-325 MG PO TABS: 1.0000 | ORAL_TABLET | Freq: Four times a day (QID) | ORAL | 0 refills | Status: DC | PRN

## 2021-03-16 MED ORDER — SCOPOLAMINE 1 MG/3DAYS TD PT72
1.0000 | MEDICATED_PATCH | TRANSDERMAL | Status: DC
  Administered 2021-03-16: 1.5 mg via TRANSDERMAL
  Filled 2021-03-16: qty 1

## 2021-03-16 NOTE — Discharge Summary (Signed)
Physician Discharge Summary  Patient ID: Renee [name] MRN: [id number] DOB/AGE: Dec 20, 1940 81 y.o.  Admit date: 03/15/2021 Discharge date: 03/16/2021  Admission Diagnoses: left hip OA  Discharge Diagnoses:  Principal Problem:   S/P total left hip arthroplasty   Discharged Condition: fair  Hospital Course: Patient underwent a left total hip arthroplasty anterior approach by Dr. Percell Miller on 5/36/64 without complications. She spent the night in observation for pain control and mobilization. She has passed her PT evaluation and is ready for d/c home with OPPT already set up.   Consults: None  Significant Diagnostic Studies: n/a  Treatments: IV hydration, antibiotics: Ancef, analgesia: acetaminophen, Vicodin, Dilaudid, and Morphine, anticoagulation: ASA, and surgery: left THA  Discharge Exam: Blood pressure (!) 123/55, pulse 88, temperature 98.9 F (37.2 C), temperature source Oral, resp. rate 16, height 5\' 4"  (1.626 m), weight 66.3 kg, SpO2 95 %. General appearance: alert, cooperative, and no distress Head: Normocephalic, without obvious abnormality, atraumatic Resp: clear to auscultation bilaterally Cardio: regular rate and rhythm, S1, S2 normal, no murmur, click, rub or gallop Extremities: extremities normal, atraumatic, no cyanosis or edema Pulses:  L brachial 2+ R brachial 2+  L radial 2+ R radial 2+  L inguinal 2+ R inguinal 2+  L popliteal 2+ R popliteal 2+  L posterior tibial 2+ R posterior tibial 2+  L dorsalis pedis 2+ R dorsalis pedis 2+   Neurologic: Alert and oriented X 3, normal strength and tone. Normal symmetric reflexes. Normal coordination and gait Incision/Wound: c/d/i  Disposition: Discharge disposition: 01-Home or Self Care       Discharge Instructions     Call MD / Call 911   Complete by: As directed    If you experience chest pain or shortness of breath, CALL 911 and be transported to the hospital emergency room.  If you develope a fever above  101 F, pus (white drainage) or increased drainage or redness at the wound, or calf pain, call your surgeon's office.   Diet - low sodium heart healthy   Complete by: As directed    Discharge instructions   Complete by: As directed    You may bear weight as tolerated. Keep your dressing on and dry until follow up. Take medicine to prevent blood clots as directed. Take pain medicine as needed with the goal of transitioning to over the counter medicines.    INSTRUCTIONS AFTER JOINT REPLACEMENT   Remove items at home which could result in a fall. This includes throw rugs or furniture in walking pathways ICE to the affected joint every three hours while awake for 30 minutes at a time, for at least the first 3-5 days, and then as needed for pain and swelling.  Continue to use ice for pain and swelling. You may notice swelling that will progress down to the foot and ankle.  This is normal after surgery.  Elevate your leg when you are not up walking on it.   Continue to use the breathing machine you got in the hospital (incentive spirometer) which will help keep your temperature down.  It is common for your temperature to cycle up and down following surgery, especially at night when you are not up moving around and exerting yourself.  The breathing machine keeps your lungs expanded and your temperature down.   DIET:  As you were doing prior to hospitalization, we recommend a well-balanced diet.  DRESSING / WOUND CARE / SHOWERING  You may shower 3 days after surgery, but keep the  wounds dry during showering.  You may use an occlusive plastic wrap (Press'n Seal for example) with blue painter's tape at edges, NO SOAKING/SUBMERGING IN THE BATHTUB.  If the bandage gets wet, call the office.   ACTIVITY  Increase activity slowly as tolerated, but follow the weight bearing instructions below.   No driving for 6 weeks or until further direction given by your physician.  You cannot drive while taking  narcotics.  No lifting or carrying greater than 10 lbs. until further directed by your surgeon. Avoid periods of inactivity such as sitting longer than an hour when not asleep. This helps prevent blood clots.  You may return to work once you are authorized by your doctor.    WEIGHT BEARING   Weight bearing as tolerated with assist device (walker, cane, etc) as directed, use it as long as suggested by your surgeon or therapist, typically at least 4-6 weeks.   EXERCISES  Results after joint replacement surgery are often greatly improved when you follow the exercise, range of motion and muscle strengthening exercises prescribed by your doctor. Safety measures are also important to protect the joint from further injury. Any time any of these exercises cause you to have increased pain or swelling, decrease what you are doing until you are comfortable again and then slowly increase them. If you have problems or questions, call your caregiver or physical therapist for advice.   Rehabilitation is important following a joint replacement. After just a few days of immobilization, the muscles of the leg can become weakened and shrink (atrophy).  These exercises are designed to build up the tone and strength of the thigh and leg muscles and to improve motion. Often times heat used for twenty to thirty minutes before working out will loosen up your tissues and help with improving the range of motion but do not use heat for the first two weeks following surgery (sometimes heat can increase post-operative swelling).   These exercises can be done on a training (exercise) mat, on the floor, on a table or on a bed. Use whatever works the best and is most comfortable for you.    Use music or television while you are exercising so that the exercises are a pleasant break in your day. This will make your life better with the exercises acting as a break in your routine that you can look forward to.   Perform all exercises  about fifteen times, three times per day or as directed.  You should exercise both the operative leg and the other leg as well.  Exercises include:   Quad Sets - Tighten up the muscle on the front of the thigh (Quad) and hold for 5-10 seconds.   Straight Leg Raises - With your knee straight (if you were given a brace, keep it on), lift the leg to 60 degrees, hold for 3 seconds, and slowly lower the leg.  Perform this exercise against resistance later as your leg gets stronger.  Leg Slides: Lying on your back, slowly slide your foot toward your buttocks, bending your knee up off the floor (only go as far as is comfortable). Then slowly slide your foot back down until your leg is flat on the floor again.  Angel Wings: Lying on your back spread your legs to the side as far apart as you can without causing discomfort.  Hamstring Strength:  Lying on your back, push your heel against the floor with your leg straight by tightening up the muscles of  your buttocks.  Repeat, but this time bend your knee to a comfortable angle, and push your heel against the floor.  You may put a pillow under the heel to make it more comfortable if necessary.   A rehabilitation program following joint replacement surgery can speed recovery and prevent re-injury in the future due to weakened muscles. Contact your doctor or a physical therapist for more information on knee rehabilitation.    CONSTIPATION  Constipation is defined medically as fewer than three stools per week and severe constipation as less than one stool per week.  Even if you have a regular bowel pattern at home, your normal regimen is likely to be disrupted due to multiple reasons following surgery.  Combination of anesthesia, postoperative narcotics, change in appetite and fluid intake all can affect your bowels.   YOU MUST use at least one of the following options; they are listed in order of increasing strength to get the job done.  They are all available  over the counter, and you may need to use some, POSSIBLY even all of these options:    Drink plenty of fluids (prune juice may be helpful) and high fiber foods Colace 100 mg by mouth twice a day  Senokot for constipation as directed and as needed Dulcolax (bisacodyl), take with full glass of water  Miralax (polyethylene glycol) once or twice a day as needed.  If you have tried all these things and are unable to have a bowel movement in the first 3-4 days after surgery call either your surgeon or your primary doctor.    If you experience loose stools or diarrhea, hold the medications until you stool forms back up.  If your symptoms do not get better within 1 week or if they get worse, check with your doctor.  If you experience "the worst abdominal pain ever" or develop nausea or vomiting, please contact the office immediately for further recommendations for treatment.   ITCHING:  If you experience itching with your medications, try taking only a single pain pill, or even half a pain pill at a time.  You can also use Benadryl over the counter for itching or also to help with sleep.   TED HOSE STOCKINGS:  Use stockings on both legs until for at least 2 weeks or as directed by physician office. They may be removed at night for sleeping.  MEDICATIONS:  See your medication summary on the "After Visit Summary" that nursing will review with you.  You may have some home medications which will be placed on hold until you complete the course of blood thinner medication.  It is important for you to complete the blood thinner medication as prescribed.  Take medicines as prescribed.   You have several different medicines that work in different ways. - Tylenol is for mild to moderate pain. Try to take this medicine before turning to your narcotic medicines.  - Robaxin is for muscle spasms. This medicine can make you drowsy. - Hydrocodone/Norco is a narcotic pain medicine.  Take this for severe pain. This  medicine can be dehydrating / constipating. - Scopolamine patch is for nausea and vomiting. - Aspirin is to prevent blood clots after surgery.   PRECAUTIONS:  If you experience chest pain or shortness of breath - call 911 immediately for transfer to the hospital emergency department.   If you develop a fever greater that 101 F, purulent drainage from wound, increased redness or drainage from wound, foul odor from the wound/dressing, or  calf pain - CONTACT YOUR SURGEON.                                                   FOLLOW-UP APPOINTMENTS:  If you do not already have a post-op appointment, please call the office [phone] for an appointment to be seen by Dr. Percell Miller in 2 weeks.   OTHER INSTRUCTIONS:   MAKE SURE YOU:  Understand these instructions.  Get help right away if you are not doing well or get worse.    Thank you for letting us be a part of your medical care team.  It is a privilege we respect greatly.  We hope these instructions will help you stay on track for a fast and full recovery!   Post-operative opioid taper instructions:   Complete by: As directed    POST-OPERATIVE OPIOID TAPER INSTRUCTIONS: It is important to wean off of your opioid medication as soon as possible. If you do not need pain medication after your surgery it is ok to stop day one. Opioids include: Codeine, Hydrocodone(Norco, Vicodin), Oxycodone(Percocet, oxycontin) and hydromorphone amongst others.  Long term and even short term use of opiods can cause: Increased pain response Dependence Constipation Depression Respiratory depression And more.  Withdrawal symptoms can include Flu like symptoms Nausea, vomiting And more Techniques to manage these symptoms Hydrate well Eat regular healthy meals Stay active Use relaxation techniques(deep breathing, meditating, yoga) Do Not substitute Alcohol to help with tapering If you have been on opioids for less than two weeks and do not have pain than it is  ok to stop all together.  Plan to wean off of opioids This plan should start within one week post op of your joint replacement. Maintain the same interval or time between taking each dose and first decrease the dose.  Cut the total daily intake of opioids by one tablet each day Next start to increase the time between doses. The last dose that should be eliminated is the evening dose.         Allergies as of 03/16/2021       Reactions   Alendronate Sodium    Other reaction(s): worse reflux   Atorvastatin Other (See Comments)   Pt reports causes "muscle aches."   Buspirone Hcl    Other reaction(s): headache   Codeine Itching   Mood changes, can take percocet   Ezetimibe    Other reaction(s): myalgias   Ibuprofen Hives   Blisters    Lisinopril Cough   Temazepam    Other reaction(s): daytime drowsiness   Tramadol Hcl    Other reaction(s): dizzy   Trazodone Hcl    Other reaction(s): nightmares   Valdecoxib    Other reaction(s): stomach pain   Aspirin Nausea Only   Patient able to take lower dose, Aspirin 81mg          Medication List     TAKE these medications    acetaminophen 500 MG tablet Commonly known as: TYLENOL Take 2 tablets (1,000 mg total) by mouth every 8 (eight) hours as needed for mild pain or moderate pain. What changed:  medication strength how much to take when to take this reasons to take this   aspirin EC 81 MG tablet Take 1 tablet (81 mg total) by mouth 2 (two) times daily. For DVT prophylaxis for 30 days after surgery. What  changed:  when to take this additional instructions   CALCIUM 1200 PO Take 1,200 mg by mouth daily.   cholecalciferol 25 MCG (1000 UNIT) tablet Commonly known as: VITAMIN D3 Take 1,000 Units by mouth daily.   docusate sodium 100 MG capsule Commonly known as: COLACE Take 1 capsule (100 mg total) by mouth 2 (two) times daily as needed for mild constipation.   HYDROcodone-acetaminophen 10-325 MG tablet Commonly  known as: Norco Take 1 tablet by mouth every 6 (six) hours as needed for severe pain.   metFORMIN 500 MG 24 hr tablet Commonly known as: GLUCOPHAGE-XR Take 500 mg by mouth 2 (two) times daily.   methocarbamol 750 MG tablet Commonly known as: Robaxin-750 Take 1 tablet (750 mg total) by mouth every 8 (eight) hours as needed for muscle spasms.   metoprolol tartrate 50 MG tablet Commonly known as: LOPRESSOR TAKE 1 TABLET(50 MG) BY MOUTH TWICE DAILY   multivitamin tablet Take 1 tablet by mouth daily.   pantoprazole 40 MG tablet Commonly known as: PROTONIX Take 1 tablet (40 mg total) by mouth 2 (two) times daily before a meal.   Prolia 60 MG/ML Sosy injection Generic drug: denosumab Inject 60 mg into the skin every 6 (six) months.   Repatha SureClick 561 MG/ML Soaj Generic drug: Evolocumab Inject 1 mL into the skin every 14 days        Follow-up Information     [name], MD. Go on 03/28/2021.   Specialty: Orthopedic Surgery Why: Your appointment is scheduled for 12:45 Contact information: [address] [place] [address] [phone]         [facility], [place]. Go on 03/17/2021.   Why: Your outpatient physical therapy is scheduled for 1:00. Please arrive at 12:45 to complete your paperwork Contact information: [facility] [place] [address] [phone]                 Signed: Alisa Graff 03/16/2021, 2:52 PM

## 2021-03-16 NOTE — TOC Transition Note (Signed)
Transition of Care [facility]) - CM/SW Discharge Note   Patient Details  Name: Renee [name] MRN: [id number] Date of Birth: [birth year]  Transition of [facility]) CM/SW Contact:  Lennart Pall, LCSW Phone Number: 03/16/2021, 10:05 AM   Clinical Narrative:    Met briefly with pt and confirming she has all needed DME at home.  Plan for OPPT at [place] offices.  No TOC needs.   Final next level of care: OP Rehab Barriers to Discharge: No Barriers Identified   Patient Goals and CMS Choice Patient states their goals for this hospitalization and ongoing recovery are:: return home      Discharge Placement                       Discharge Plan and Services                DME Arranged: N/A DME Agency: NA                  Social Determinants of Health (SDOH) Interventions     Readmission Risk Interventions No flowsheet data found.

## 2021-03-16 NOTE — Progress Notes (Signed)
° ° °  Subjective: Patient reports pain as mild to moderate. Biggest issue is her N/V since last night. She is currently throwing up during my visit with her. Unable to eat or drink anything since dinner. Urinating. No CP, SOB.  Hasn't mobilized with PT OOB yet but has been to bathroom with RN.   Objective:   VITALS:   Vitals:   03/15/21 1757 03/15/21 2011 03/16/21 0130 03/16/21 0609  BP: (!) 162/82 122/67 (!) 101/55 115/71  Pulse: 87 98 90 82  Resp: 16 16 20 20   Temp: 98 F (36.7 C) 98 F (36.7 C) 98.3 F (36.8 C) 97.6 F (36.4 C)  TempSrc: Oral Oral Oral Oral  SpO2: 100% 95% 99% 95%  Weight:      Height:       CBC Latest Ref Rng & Units 03/02/2021 11/09/2020 10/13/2020  WBC 4.0 - 10.5 K/uL 10.0 11.7(H) 12.4(H)  Hemoglobin 12.0 - 15.0 g/dL 15.1(H) 13.8 13.0  Hematocrit 36.0 - 46.0 % 46.4(H) 42.5 39.8  Platelets 150 - 400 K/uL 462(H) 454.0(H) 555(H)   BMP Latest Ref Rng & Units 03/02/2021 11/09/2020 10/13/2020  Glucose 70 - 99 mg/dL 93 177(H) 205(H)  BUN 8 - 23 mg/dL 17 13 6(L)  Creatinine 0.44 - 1.00 mg/dL 0.73 0.55 0.49  BUN/Creat Ratio 12 - 28 - - -  Sodium 135 - 145 mmol/L 135 134(L) 131(L)  Potassium 3.5 - 5.1 mmol/L 4.2 3.8 3.8  Chloride 98 - 111 mmol/L 99 95(L) 97(L)  CO2 22 - 32 mmol/L 26 26 25   Calcium 8.9 - 10.3 mg/dL 10.0 10.5 9.0   Intake/Output      02/14 0701 02/15 0700 02/15 0701 02/16 0700   P.O. 410    I.V. (mL/kg) 1100 (16.6)    Other 280    IV Piggyback 300    Total Intake(mL/kg) 2090 (31.5)    Urine (mL/kg/hr) 1300    Blood 200    Total Output 1500    Net +590            Physical Exam: General: NAD.  Sitting up in bed, vomiting into bag, obvious discomfort Resp: No increased wob Cardio: regular rate and rhythm ABD soft Neurologically intact MSK Neurovascularly intact Sensation intact distally Intact pulses distally Dorsiflexion/Plantar flexion intact Incision: dressing C/D/I   Assessment: 1 Day Post-Op  S/P Procedure(s) (LRB): TOTAL  HIP ARTHROPLASTY ANTERIOR APPROACH (Left) by Dr. Ernesta Amble. Murphy on 03/15/21  Principal Problem:   S/P total left hip arthroplasty   Plan: Control N/V. She has QT prolongation so can't have Zofran or Phenergan. Ordered Scopalamine patch  Advance diet Up with therapy Incentive Spirometry Elevate and Apply ice  Weightbearing: WBAT LLE Insicional and dressing care: Dressings left intact until follow-up and Reinforce dressings as needed Orthopedic device(s): None Showering: Keep dressing dry VTE prophylaxis: Aspirin 81mg  BID  x 30 days , SCDs, ambulation Pain control: continue current regimen Follow - up plan: 2 weeks Contact information:  [name] MD, Aggie Moats PA-C  Dispo: Home hopefully later today if feeling better and mobilizing well.     Britt Bottom, PA-C Office [phone] 03/16/2021, 8:44 AM

## 2021-03-16 NOTE — Progress Notes (Signed)
Physical Therapy Treatment Patient Details Name: Renee [name] MRN: [id number] DOB: November 03, 1940 Today's Date: 03/16/2021   History of Present Illness 81 yo female s/p L DA THA on 03/15/21. PMH: bil TKA, anemia, DM, DVT, HTN, COPD, breast CA, fibromyalgia    PT Comments    Pt is making excellent progress. Meeting PT goals, ready  for d/c with family assist prn from PT standpoint   Recommendations for follow up therapy are one component of a multi-disciplinary discharge planning process, led by the attending physician.  Recommendations may be updated based on patient status, additional functional criteria and insurance authorization.  Follow Up Recommendations  Follow physician's recommendations for discharge plan and follow up therapies     Assistance Recommended at Discharge Intermittent Supervision/Assistance  Patient can return home with the following Assist for transportation;Help with stairs or ramp for entrance   Equipment Recommendations  None recommended by PT    Recommendations for Other Services       Precautions / Restrictions Precautions Precautions: Fall Restrictions Weight Bearing Restrictions: No Other Position/Activity Restrictions: WBAT     Mobility  Bed Mobility Overal bed mobility: Needs Assistance Bed Mobility: Supine to Sit     Supine to sit: Supervision     General bed mobility comments: for safety, no physical assist    Transfers Overall transfer level: Needs assistance Equipment used: Rolling walker (2 wheels) Transfers: Sit to/from Stand Sit to Stand: Min guard/supervision           General transfer comment: cues for hand placement    Ambulation/Gait Ambulation/Gait assistance: Supervision Gait Distance (Feet): 15 Feet (x2) Assistive device: Rolling walker (2 wheels) Gait Pattern/deviations: Step-to pattern, Decreased stance time - left, Step-through pattern       General Gait Details: cues for beginning step through, good  gait stability   Stairs             Wheelchair Mobility    Modified Rankin (Stroke Patients Only)       Balance                                            Cognition Arousal/Alertness: Awake/alert Behavior During Therapy: WFL for tasks assessed/performed Overall Cognitive Status: Within Functional Limits for tasks assessed                                          Exercises Total Joint Exercises Ankle Circles/Pumps: AROM, Both, 10 reps Quad Sets: AROM, Both, 10 reps Short Arc Quad: AROM, Left, 10 reps Heel Slides: AAROM, Left, 10 reps Hip ABduction/ADduction: AROM, AAROM, Left, 10 reps    General Comments        Pertinent Vitals/Pain Pain Assessment Pain Assessment: 0-10 Pain Score: 3  Pain Location: L hip Pain Descriptors / Indicators: Sore, Discomfort Pain Intervention(s): Limited activity within patient's tolerance, Premedicated before session, Monitored during session, Repositioned, Ice applied    Home Living Family/patient expects to be discharged to:: Private residence Living Arrangements: Spouse/significant other Available Help at Discharge: Family;Available PRN/intermittently Type of Home: House Home Access: Ramped entrance       Home Layout: One level Home Equipment: Rolling Walker (2 wheels);BSC/3in1;Cane - single point      Prior Function  PT Goals (current goals can now be found in the care plan section) Acute Rehab PT Goals Patient Stated Goal: home soon PT Goal Formulation: With patient Potential to Achieve Goals: Good    Frequency    7X/week      PT Plan Current plan remains appropriate    Co-evaluation              AM-PAC PT "6 Clicks" Mobility   Outcome Measure  Help needed turning from your back to your side while in a flat bed without using bedrails?: A Little Help needed moving from lying on your back to sitting on the side of a flat bed without using bedrails?:  A Little Help needed moving to and from a bed to a chair (including a wheelchair)?: A Little Help needed standing up from a chair using your arms (e.g., wheelchair or bedside chair)?: A Little Help needed to walk in hospital room?: A Little Help needed climbing 3-5 steps with a railing? : A Little 6 Click Score: 18    End of Session Equipment Utilized During Treatment: Gait belt Activity Tolerance: Patient tolerated treatment well Patient left: with call bell/phone within reach;in bed;with family/visitor present Nurse Communication: Mobility status PT Visit Diagnosis: Difficulty in walking, not elsewhere classified (R26.2)     Time: 8887-5797 PT Time Calculation (min) (ACUTE ONLY): 21 min  Charges:  $Therapeutic Exercise: 8-22 mins                     Baxter Flattery, PT  Acute Rehab Dept ([place]) [phone] Pager [phone]  03/16/2021    [facility] 03/16/2021, 1:10 PM

## 2021-03-16 NOTE — Evaluation (Signed)
Physical Therapy Evaluation Patient Details Name: Renee [name] MRN: [id number] DOB: 12-31-1940 Today's Date: 03/16/2021  History of Present Illness  81 yo female s/p L DA THA on 03/15/21. PMH: bil TKA, anemia, DM, DVT, HTN, COPD, breast CA, fibromyalgia  Clinical Impression  Pt is s/p THA resulting in the deficits listed below (see PT Problem List).  Pt  doing well, will see again for review HEP and pt should be ready to d/c this afternoon  Pt will benefit from skilled PT to increase their independence and safety with mobility to allow discharge to the venue listed below.         Recommendations for follow up therapy are one component of a multi-disciplinary discharge planning process, led by the attending physician.  Recommendations may be updated based on patient status, additional functional criteria and insurance authorization.  Follow Up Recommendations Follow physician's recommendations for discharge plan and follow up therapies    Assistance Recommended at Discharge Intermittent Supervision/Assistance  Patient can return home with the following  Assist for transportation;Help with stairs or ramp for entrance    Equipment Recommendations None recommended by PT  Recommendations for Other Services       Functional Status Assessment       Precautions / Restrictions Precautions Precautions: Fall Restrictions Weight Bearing Restrictions: No Other Position/Activity Restrictions: WBAT      Mobility  Bed Mobility Overal bed mobility: Needs Assistance Bed Mobility: Supine to Sit     Supine to sit: Supervision     General bed mobility comments: for safety, no physical assist    Transfers Overall transfer level: Needs assistance Equipment used: Rolling walker (2 wheels) Transfers: Sit to/from Stand Sit to Stand: Min guard           General transfer comment: cues for hand placement    Ambulation/Gait Ambulation/Gait assistance: Min guard Gait Distance  (Feet): 80 Feet Assistive device: Rolling walker (2 wheels) Gait Pattern/deviations: Step-to pattern, Decreased stance time - left       General Gait Details: slower but steady gait, no LOB. initial cues for sequence  Stairs            Wheelchair Mobility    Modified Rankin (Stroke Patients Only)       Balance                                             Pertinent Vitals/Pain Pain Assessment Pain Assessment: 0-10 Pain Score: 3  Pain Location: L hip Pain Descriptors / Indicators: Sore, Discomfort Pain Intervention(s): Limited activity within patient's tolerance, Premedicated before session, Monitored during session, Repositioned, Ice applied    Home Living Family/patient expects to be discharged to:: Private residence Living Arrangements: Spouse/significant other Available Help at Discharge: Family;Available PRN/intermittently Type of Home: House Home Access: Ramped entrance       Home Layout: One level Home Equipment: Conservation officer, nature (2 wheels);BSC/3in1;Cane - single point      Prior Function Prior Level of Function : Independent/Modified Independent             Mobility Comments: using cane d/t hip pain       Hand Dominance        Extremity/Trunk Assessment   Upper Extremity Assessment Upper Extremity Assessment: Overall WFL for tasks assessed    Lower Extremity Assessment Lower Extremity Assessment: LLE deficits/detail LLE Deficits / Details: ankle WFL.  knee and hip grossly 3/5, limited by post op pain       Communication   Communication: No difficulties  Cognition Arousal/Alertness: Awake/alert Behavior During Therapy: WFL for tasks assessed/performed Overall Cognitive Status: Within Functional Limits for tasks assessed                                          General Comments      Exercises     Assessment/Plan    PT Assessment    PT Problem List         PT Treatment Interventions       PT Goals (Current goals can be found in the Care Plan section)  Acute Rehab PT Goals Patient Stated Goal: home soon PT Goal Formulation: With patient Potential to Achieve Goals: Good    Frequency 7X/week     Co-evaluation               AM-PAC PT "6 Clicks" Mobility  Outcome Measure Help needed turning from your back to your side while in a flat bed without using bedrails?: A Little Help needed moving from lying on your back to sitting on the side of a flat bed without using bedrails?: A Little Help needed moving to and from a bed to a chair (including a wheelchair)?: A Little Help needed standing up from a chair using your arms (e.g., wheelchair or bedside chair)?: A Little Help needed to walk in hospital room?: A Little Help needed climbing 3-5 steps with a railing? : A Little 6 Click Score: 18    End of Session Equipment Utilized During Treatment: Gait belt Activity Tolerance: Patient tolerated treatment well Patient left: in chair;with call bell/phone within reach;with chair alarm set Nurse Communication: Mobility status PT Visit Diagnosis: Difficulty in walking, not elsewhere classified (R26.2)    Time: 1025-1039 PT Time Calculation (min) (ACUTE ONLY): 14 min   Charges:   PT Evaluation $PT Eval Low Complexity: [place], PT  Acute Rehab Dept ([place]) [phone] Pager [phone]  03/16/2021   [facility] 03/16/2021, 11:45 AM

## 2021-03-17 DIAGNOSIS — R262 Difficulty in walking, not elsewhere classified: Secondary | ICD-10-CM | POA: Diagnosis not present

## 2021-03-17 DIAGNOSIS — M25552 Pain in left hip: Secondary | ICD-10-CM | POA: Diagnosis not present

## 2021-03-17 DIAGNOSIS — Z96642 Presence of left artificial hip joint: Secondary | ICD-10-CM | POA: Diagnosis not present

## 2021-03-17 DIAGNOSIS — M6281 Muscle weakness (generalized): Secondary | ICD-10-CM | POA: Diagnosis not present

## 2021-03-17 DIAGNOSIS — M1612 Unilateral primary osteoarthritis, left hip: Secondary | ICD-10-CM | POA: Diagnosis not present

## 2021-03-23 ENCOUNTER — Other Ambulatory Visit (HOSPITAL_COMMUNITY): Payer: Self-pay

## 2021-03-25 DIAGNOSIS — E7800 Pure hypercholesterolemia, unspecified: Secondary | ICD-10-CM | POA: Diagnosis not present

## 2021-03-25 DIAGNOSIS — Z96642 Presence of left artificial hip joint: Secondary | ICD-10-CM | POA: Diagnosis not present

## 2021-03-25 DIAGNOSIS — M25552 Pain in left hip: Secondary | ICD-10-CM | POA: Diagnosis not present

## 2021-03-25 DIAGNOSIS — M6281 Muscle weakness (generalized): Secondary | ICD-10-CM | POA: Diagnosis not present

## 2021-03-25 DIAGNOSIS — M545 Low back pain, unspecified: Secondary | ICD-10-CM | POA: Diagnosis not present

## 2021-03-25 DIAGNOSIS — I10 Essential (primary) hypertension: Secondary | ICD-10-CM | POA: Diagnosis not present

## 2021-03-25 DIAGNOSIS — M1612 Unilateral primary osteoarthritis, left hip: Secondary | ICD-10-CM | POA: Diagnosis not present

## 2021-03-25 DIAGNOSIS — R262 Difficulty in walking, not elsewhere classified: Secondary | ICD-10-CM | POA: Diagnosis not present

## 2021-03-25 DIAGNOSIS — E1165 Type 2 diabetes mellitus with hyperglycemia: Secondary | ICD-10-CM | POA: Diagnosis not present

## 2021-03-31 ENCOUNTER — Other Ambulatory Visit (HOSPITAL_COMMUNITY): Payer: Self-pay

## 2021-03-31 DIAGNOSIS — Z9081 Acquired absence of spleen: Secondary | ICD-10-CM | POA: Diagnosis not present

## 2021-03-31 DIAGNOSIS — E7800 Pure hypercholesterolemia, unspecified: Secondary | ICD-10-CM | POA: Diagnosis not present

## 2021-03-31 DIAGNOSIS — Z90411 Acquired partial absence of pancreas: Secondary | ICD-10-CM | POA: Diagnosis not present

## 2021-03-31 DIAGNOSIS — I2584 Coronary atherosclerosis due to calcified coronary lesion: Secondary | ICD-10-CM | POA: Diagnosis not present

## 2021-03-31 DIAGNOSIS — E1165 Type 2 diabetes mellitus with hyperglycemia: Secondary | ICD-10-CM | POA: Diagnosis not present

## 2021-03-31 DIAGNOSIS — I10 Essential (primary) hypertension: Secondary | ICD-10-CM | POA: Diagnosis not present

## 2021-03-31 MED ORDER — METFORMIN HCL ER 500 MG PO TB24
ORAL_TABLET | ORAL | 3 refills | Status: AC
  Filled 2021-03-31: qty 180, 90d supply, fill #0
  Filled 2021-08-27: qty 180, 90d supply, fill #1
  Filled 2021-11-21: qty 180, 90d supply, fill #2

## 2021-03-31 MED ORDER — REPATHA SURECLICK 140 MG/ML ~~LOC~~ SOAJ
SUBCUTANEOUS | 3 refills | Status: DC
  Filled 2021-03-31 – 2021-04-19 (×2): qty 2, 28d supply, fill #0
  Filled 2021-05-20: qty 2, 28d supply, fill #1
  Filled 2021-06-22: qty 2, 28d supply, fill #2
  Filled 2021-07-17: qty 2, 28d supply, fill #3
  Filled 2021-08-18: qty 2, 28d supply, fill #4
  Filled 2021-09-14: qty 2, 28d supply, fill #5
  Filled 2021-10-20: qty 2, 28d supply, fill #6
  Filled 2021-11-14: qty 2, 28d supply, fill #7
  Filled 2021-12-10: qty 2, 28d supply, fill #8
  Filled 2022-01-08: qty 2, 28d supply, fill #9
  Filled 2022-02-08: qty 2, 28d supply, fill #10
  Filled 2022-03-04: qty 2, 28d supply, fill #11

## 2021-03-31 MED ORDER — METOPROLOL TARTRATE 50 MG PO TABS
ORAL_TABLET | ORAL | 3 refills | Status: DC
  Filled 2021-03-31 – 2021-09-14 (×3): qty 180, 90d supply, fill #0
  Filled 2021-12-10: qty 180, 90d supply, fill #1
  Filled 2022-03-21 – 2022-03-22 (×2): qty 180, 90d supply, fill #2

## 2021-04-01 ENCOUNTER — Other Ambulatory Visit (HOSPITAL_COMMUNITY): Payer: Self-pay

## 2021-04-02 ENCOUNTER — Other Ambulatory Visit (HOSPITAL_COMMUNITY): Payer: Self-pay

## 2021-04-06 DIAGNOSIS — M1612 Unilateral primary osteoarthritis, left hip: Secondary | ICD-10-CM | POA: Diagnosis not present

## 2021-04-06 DIAGNOSIS — Z96642 Presence of left artificial hip joint: Secondary | ICD-10-CM | POA: Diagnosis not present

## 2021-04-06 DIAGNOSIS — M25552 Pain in left hip: Secondary | ICD-10-CM | POA: Diagnosis not present

## 2021-04-06 DIAGNOSIS — M6281 Muscle weakness (generalized): Secondary | ICD-10-CM | POA: Diagnosis not present

## 2021-04-06 DIAGNOSIS — R262 Difficulty in walking, not elsewhere classified: Secondary | ICD-10-CM | POA: Diagnosis not present

## 2021-04-13 ENCOUNTER — Ambulatory Visit: Payer: Medicare Other | Admitting: Cardiovascular Disease

## 2021-04-13 DIAGNOSIS — M25552 Pain in left hip: Secondary | ICD-10-CM | POA: Diagnosis not present

## 2021-04-13 DIAGNOSIS — M6281 Muscle weakness (generalized): Secondary | ICD-10-CM | POA: Diagnosis not present

## 2021-04-13 DIAGNOSIS — R262 Difficulty in walking, not elsewhere classified: Secondary | ICD-10-CM | POA: Diagnosis not present

## 2021-04-13 DIAGNOSIS — M1612 Unilateral primary osteoarthritis, left hip: Secondary | ICD-10-CM | POA: Diagnosis not present

## 2021-04-13 DIAGNOSIS — Z96642 Presence of left artificial hip joint: Secondary | ICD-10-CM | POA: Diagnosis not present

## 2021-04-19 ENCOUNTER — Other Ambulatory Visit (HOSPITAL_COMMUNITY): Payer: Self-pay

## 2021-04-20 ENCOUNTER — Other Ambulatory Visit (HOSPITAL_COMMUNITY): Payer: Self-pay

## 2021-04-20 DIAGNOSIS — M1612 Unilateral primary osteoarthritis, left hip: Secondary | ICD-10-CM | POA: Diagnosis not present

## 2021-04-20 DIAGNOSIS — R262 Difficulty in walking, not elsewhere classified: Secondary | ICD-10-CM | POA: Diagnosis not present

## 2021-04-20 DIAGNOSIS — Z96642 Presence of left artificial hip joint: Secondary | ICD-10-CM | POA: Diagnosis not present

## 2021-04-20 DIAGNOSIS — M6281 Muscle weakness (generalized): Secondary | ICD-10-CM | POA: Diagnosis not present

## 2021-04-20 DIAGNOSIS — M25552 Pain in left hip: Secondary | ICD-10-CM | POA: Diagnosis not present

## 2021-04-21 ENCOUNTER — Other Ambulatory Visit (HOSPITAL_COMMUNITY): Payer: Self-pay

## 2021-04-22 DIAGNOSIS — M1612 Unilateral primary osteoarthritis, left hip: Secondary | ICD-10-CM | POA: Diagnosis not present

## 2021-04-27 DIAGNOSIS — R262 Difficulty in walking, not elsewhere classified: Secondary | ICD-10-CM | POA: Diagnosis not present

## 2021-04-27 DIAGNOSIS — Z96642 Presence of left artificial hip joint: Secondary | ICD-10-CM | POA: Diagnosis not present

## 2021-04-27 DIAGNOSIS — M1612 Unilateral primary osteoarthritis, left hip: Secondary | ICD-10-CM | POA: Diagnosis not present

## 2021-04-27 DIAGNOSIS — M25552 Pain in left hip: Secondary | ICD-10-CM | POA: Diagnosis not present

## 2021-04-27 DIAGNOSIS — M6281 Muscle weakness (generalized): Secondary | ICD-10-CM | POA: Diagnosis not present

## 2021-04-29 ENCOUNTER — Telehealth: Payer: Self-pay | Admitting: Physician Assistant

## 2021-04-29 NOTE — Telephone Encounter (Signed)
Scheduled appt per 3/29 referral. Pt is aware of appt date and time. Pt is aware to arrive 15 mins prior to appt time and to bring and updated insurance card. Pt is aware of appt location.   ?

## 2021-05-17 ENCOUNTER — Encounter: Payer: Self-pay | Admitting: Physician Assistant

## 2021-05-17 ENCOUNTER — Other Ambulatory Visit: Payer: Self-pay

## 2021-05-17 ENCOUNTER — Inpatient Hospital Stay: Payer: Medicare Other

## 2021-05-17 ENCOUNTER — Inpatient Hospital Stay: Payer: Medicare Other | Attending: Physician Assistant | Admitting: Physician Assistant

## 2021-05-17 VITALS — BP 142/69 | HR 81 | Temp 97.7°F | Resp 18 | Wt 147.7 lb

## 2021-05-17 DIAGNOSIS — Z79899 Other long term (current) drug therapy: Secondary | ICD-10-CM | POA: Diagnosis not present

## 2021-05-17 DIAGNOSIS — R918 Other nonspecific abnormal finding of lung field: Secondary | ICD-10-CM | POA: Insufficient documentation

## 2021-05-17 DIAGNOSIS — R911 Solitary pulmonary nodule: Secondary | ICD-10-CM

## 2021-05-17 DIAGNOSIS — N644 Mastodynia: Secondary | ICD-10-CM | POA: Insufficient documentation

## 2021-05-17 DIAGNOSIS — Z853 Personal history of malignant neoplasm of breast: Secondary | ICD-10-CM | POA: Insufficient documentation

## 2021-05-17 DIAGNOSIS — Z87891 Personal history of nicotine dependence: Secondary | ICD-10-CM | POA: Insufficient documentation

## 2021-05-17 DIAGNOSIS — N289 Disorder of kidney and ureter, unspecified: Secondary | ICD-10-CM | POA: Diagnosis not present

## 2021-05-17 DIAGNOSIS — Z9013 Acquired absence of bilateral breasts and nipples: Secondary | ICD-10-CM | POA: Insufficient documentation

## 2021-05-17 LAB — CMP (CANCER CENTER ONLY)
ALT: 7 U/L (ref 0–44)
AST: 16 U/L (ref 15–41)
Albumin: 4.3 g/dL (ref 3.5–5.0)
Alkaline Phosphatase: 76 U/L (ref 38–126)
Anion gap: 8 (ref 5–15)
BUN: 14 mg/dL (ref 8–23)
CO2: 31 mmol/L (ref 22–32)
Calcium: 10 mg/dL (ref 8.9–10.3)
Chloride: 101 mmol/L (ref 98–111)
Creatinine: 0.77 mg/dL (ref 0.44–1.00)
GFR, Estimated: >60 mL/min (ref >60)
Glucose, Bld: 160 mg/dL — ABNORMAL HIGH (ref 70–99)
Potassium: 3.7 mmol/L (ref 3.5–5.1)
Sodium: 140 mmol/L (ref 135–145)
Total Bilirubin: 0.4 mg/dL (ref 0.3–1.2)
Total Protein: 7.8 g/dL (ref 6.5–8.1)

## 2021-05-17 LAB — CBC WITH DIFFERENTIAL (CANCER CENTER ONLY)
Abs Immature Granulocytes: 0.02 10*3/uL (ref 0.00–0.07)
Basophils Absolute: 0 10*3/uL (ref 0.0–0.1)
Basophils Relative: 0 %
Eosinophils Absolute: 0.1 10*3/uL (ref 0.0–0.5)
Eosinophils Relative: 1 %
HCT: 45 % (ref 36.0–46.0)
Hemoglobin: 14.5 g/dL (ref 12.0–15.0)
Immature Granulocytes: 0 %
Lymphocytes Relative: 28 %
Lymphs Abs: 2.2 10*3/uL (ref 0.7–4.0)
MCH: 31.9 pg (ref 26.0–34.0)
MCHC: 32.2 g/dL (ref 30.0–36.0)
MCV: 98.9 fL (ref 80.0–100.0)
Monocytes Absolute: 0.8 10*3/uL (ref 0.1–1.0)
Monocytes Relative: 10 %
Neutro Abs: 4.8 10*3/uL (ref 1.7–7.7)
Neutrophils Relative %: 61 %
Platelet Count: 384 10*3/uL (ref 150–400)
RBC: 4.55 MIL/uL (ref 3.87–5.11)
RDW: 12.8 % (ref 11.5–15.5)
WBC Count: 7.8 10*3/uL (ref 4.0–10.5)
nRBC: 0 % (ref 0.0–0.2)

## 2021-05-17 NOTE — Progress Notes (Signed)
?[facility] ?[place] ?[phone]   [phone] ? ?INITIAL CONSULTATION: ? ?Patient Care Team: ?Deland Pretty, MD as PCP - General (Internal Medicine) ?Burnell Blanks, MD as PCP - Cardiology (Cardiology) ? ?CHIEF COMPLAINTS/PURPOSE OF CONSULTATION:  ?Left renal lesion ? ?HISTORY OF PRESENTING ILLNESS:  ?Renee [name] is a 81 y.o. female who presents to the diagnostic clinic for evaluation of left renal lesion. ? ?On review of the previous records, [name] underwent CT imaging of the abdomen and pelvis on 11/23/2016 which revealed a new 10 mm indeterminate subcapsular lesion in the left kidney.  MRI of the abdomen was obtained on 05/18/2017 which showed an exophytic 0.9 cm Bosniak category 2 hemorrhagic/proteinaceous renal cyst in the posterior interpolar left kidney, stable from prior CT scan.  CT scan of the chest from 01/26/2017 showed multiple new 2 mm lung nodules and stable appearance of previously noted pulmonary nodules.She was evaluated by Dr. Delton Coombes at [facility] on 04/20/2019.  Recommendation was to monitor the left renal lesion and subcentimeter lung nodules. ? ?On exam today, [name] reports that her energy levels are fairly stable.  She is able to complete her daily routines on her own.  She does use a cane to help with ambulation.  Her appetite is stable without any recent weight loss.  She was very not sure this after starting metformin but decided to discontinue on her own 2 weeks ago.  Since discontinuing metformin, her nausea has resolved.  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  Her bowel habits are regular without any recurrent episodes of diarrhea or constipation.  She denies easy bruising or signs of active bleeding.  She denies any urinary symptoms.  Patient denies fevers, chills, night sweats, shortness of breath, chest pain or  cough.  She has no other complaints.  Rest of the 10 point ROS is below. ? ?MEDICAL HISTORY:  ?Past Medical History:  ?Diagnosis Date  ? Anemia   ? history only at age 58 yrs olds, no current problems  ? Aortic atherosclerosis ([place])   ? Arthritis   ? oa  ? Blood transfusion 1948  ? Blood transfusion without reported diagnosis   ? Breast cancer ([place]) 1994  ? bilateral / HX skin cancer  ? Carotid atherosclerosis   ? Cataract   ? both eyes  ? Constipation   ? COPD (chronic obstructive pulmonary disease) ([place])   ? no meds/no inaler  ? Depression   ? Diabetes ([place])   ? type 2 - diet controlled, no meds  ? Disc disease, degenerative, cervical   ? trouble turing head and neck at times  ? Diverticulosis   ? DVT (deep venous thrombosis) ([place]) 2002  ? left leg  ? Dyspnea   ? occasional with exertion  ? Dysrhythmia   ? Family history of adverse reaction to anesthesia   ? Fibromyalgia   ? GAD (generalized anxiety disorder)   ? GERD (gastroesophageal reflux disease)   ? History of kidney stones   ? History of vertigo   ? Hx of skin cancer, basal cell   ? Hyperlipidemia   ? Hypertension   ? Measles as child  ? Mumps age 80  ? Osteoarthritis   ? Osteoporosis   ? Pancreatic cyst   ? Pancreatitis   ? Pneumonia   ? PONV (postoperative nausea and vomiting)   ? PONV  ? Pulmonary nodule   ?  Right renal mass   ? Seborrheic keratosis   ? Sinus tachycardia   ? Sinus tachycardia   ? Tubular adenoma of colon   ? ? ?SURGICAL HISTORY: ?Past Surgical History:  ?Procedure Laterality Date  ? ABDOMINAL HYSTERECTOMY    ? achilles tendon adn 2 bone spurs removed Left 05/2012  ? APPENDECTOMY    ? [place]  ? lower  ? BASAL CELL CARCINOMA EXCISION    ? bilateral mastectomy with tramflap reconstruction  1994  ? BIOPSY  05/13/2020  ? Procedure: BIOPSY;  Surgeon: Irving Copas., MD;  Location: [place];  Service: Gastroenterology;;  ? BREAST REDUCTION SURGERY    ? CARPAL TUNNEL RELEASE Right 03/2017  ? CATARACT EXTRACTION    ?  COLONOSCOPY    ? ESOPHAGOGASTRODUODENOSCOPY (EGD) WITH PROPOFOL N/A 05/13/2020  ? Procedure: ESOPHAGOGASTRODUODENOSCOPY (EGD) WITH PROPOFOL;  Surgeon: Rush Landmark Telford Nab., MD;  Location: [facility];  Service: Gastroenterology;  Laterality: N/A;  ? EUS N/A 05/13/2020  ? Procedure: UPPER ENDOSCOPIC ULTRASOUND (EUS) RADIAL;  Surgeon: Rush Landmark Telford Nab., MD;  Location: [place];  Service: Gastroenterology;  Laterality: N/A;  ? FOOT SURGERY    ? KNEE ARTHROSCOPY Right 2008  ? KNEE SURGERY  2002  ? arthroscopy-bilateral  ? LAPAROSCOPIC LIVER ULTRASOUND N/A 08/30/2020  ? Procedure: LAPAROSCOPIC INTRAOPERATIVE ULTRASOUND;  Surgeon: Dwan Bolt, MD;  Location: [facility];  Service: General;  Laterality: N/A;  ? LAPAROSCOPIC SPLENECTOMY N/A 08/30/2020  ? Procedure: LAPAROSCOPIC SPLENECTOMY;  Surgeon: Dwan Bolt, MD;  Location: [facility];  Service: General;  Laterality: N/A;  ? MASTECTOMY    ? bilateral  ? PANCREATECTOMY N/A 08/30/2020  ? Procedure: LAPAROSCOPIC DISTAL PANCREATECTOMY;  Surgeon: Dwan Bolt, MD;  Location: [place];  Service: General;  Laterality: N/A;  ? PARTIAL HYSTERECTOMY    ? POLYPECTOMY    ? SHOULDER SURGERY Left   ? TONSILLECTOMY    ? TOTAL HIP ARTHROPLASTY Left 03/15/2021  ? Procedure: TOTAL HIP ARTHROPLASTY ANTERIOR APPROACH;  Surgeon: [name], MD;  Location: [facility];  Service: Orthopedics;  Laterality: Left;  ? TOTAL KNEE ARTHROPLASTY Left 10/14/2012  ? Procedure: LEFT TOTAL KNEE ARTHROPLASTY;  Surgeon: Gearlean Alf, MD;  Location: [facility];  Service: Orthopedics;  Laterality: Left;  ? TOTAL KNEE ARTHROPLASTY Right 10/20/2013  ? Procedure: RIGHT TOTAL KNEE ARTHROPLASTY;  Surgeon: Gearlean Alf, MD;  Location: [facility];  Service: Orthopedics;  Laterality: Right;  ? ? ?SOCIAL HISTORY: ?Social History  ? ?Socioeconomic History  ? Marital status: Married  ?  Spouse name: Not on file  ? Number of children: 2  ? Years of education: Not on file  ? Highest education level: Not on file   ?Occupational History  ? Occupation: RETIRED  ?  Employer: RETIRED  ?Tobacco Use  ? Smoking status: Former  ?  Packs/day: 1.00  ?  Years: 20.00  ?  Pack years: 20.00  ?  Types: Cigarettes  ?  Quit date: 02/11/1992  ?  Years since quitting: 29.2  ? Smokeless tobacco: Never  ?Vaping Use  ? Vaping Use: Never used  ?Substance and Sexual Activity  ? Alcohol use: Not Currently  ?  Comment: occasional wine-rare  ? Drug use: No  ? Sexual activity: Not Currently  ?  Partners: Male  ?  Birth control/protection: Surgical, Post-menopausal  ?  Comment: hYSTERECTOMY  ?Other Topics Concern  ? Not on file  ?Social History Narrative  ? Not on file  ? ?Social  Determinants of Health  ? ?Financial Resource Strain: Not on file  ?Food Insecurity: Not on file  ?Transportation Needs: Not on file  ?Physical Activity: Not on file  ?Stress: Not on file  ?Social Connections: Not on file  ?Intimate Partner Violence: Not on file  ? ? ?FAMILY HISTORY: ?Family History  ?Problem Relation Age of Onset  ? Alzheimer's disease Mother   ? Alcohol abuse Father   ? Sudden death Father   ? Liver disease Father   ?     fatty liver disease; alcohol abuse  ? Other Father   ?     gastritis  ? Healthy Brother   ? Alcohol abuse Daughter   ? COPD Daughter   ? Healthy Grandchild   ? Heart disease Maternal Grandfather   ? CAD Maternal Grandfather   ? Sudden death Brother   ?     at birth  ? Unexplained death Daughter   ? Alcohol abuse Daughter   ? Healthy Sister   ? Colon cancer Neg Hx   ? Colon polyps Neg Hx   ? Rectal cancer Neg Hx   ? Stomach cancer Neg Hx   ? Esophageal cancer Neg Hx   ? ? ?ALLERGIES:  is allergic to hydrocodone, alendronate sodium, atorvastatin, buspirone hcl, codeine, ezetimibe, ibuprofen, lisinopril, temazepam, tramadol hcl, trazodone hcl, valdecoxib, and aspirin. ? ?MEDICATIONS:  ?Current Outpatient Medications  ?Medication Sig Dispense Refill  ? acetaminophen (TYLENOL) 500 MG tablet Take 2 tablets (1,000 mg total) by mouth every 8 (eight)  hours as needed for mild pain or moderate pain. 60 tablet 0  ? aspirin EC 81 MG tablet Take 1 tablet (81 mg total) by mouth 2 (two) times daily. For DVT prophylaxis for 30 days after surgery. 60 tablet 0

## 2021-05-19 ENCOUNTER — Other Ambulatory Visit: Payer: Self-pay

## 2021-05-19 ENCOUNTER — Ambulatory Visit
Admission: RE | Admit: 2021-05-19 | Discharge: 2021-05-19 | Disposition: A | Discharge status: Home / Self Care | Payer: Medicare Other | Source: Ambulatory Visit | Attending: Physician Assistant | Admitting: Physician Assistant

## 2021-05-19 DIAGNOSIS — N644 Mastodynia: Secondary | ICD-10-CM

## 2021-05-20 ENCOUNTER — Other Ambulatory Visit (HOSPITAL_COMMUNITY): Payer: Self-pay

## 2021-05-23 ENCOUNTER — Other Ambulatory Visit: Payer: Self-pay

## 2021-05-23 ENCOUNTER — Ambulatory Visit (HOSPITAL_COMMUNITY): Admission: RE | Admit: 2021-05-23 | Payer: Medicare Other | Source: Ambulatory Visit

## 2021-05-23 DIAGNOSIS — R918 Other nonspecific abnormal finding of lung field: Secondary | ICD-10-CM

## 2021-05-23 DIAGNOSIS — C3490 Malignant neoplasm of unspecified part of unspecified bronchus or lung: Secondary | ICD-10-CM

## 2021-05-24 ENCOUNTER — Telehealth: Payer: Self-pay

## 2021-05-24 NOTE — Telephone Encounter (Signed)
Pt advised and voiced understanding.   

## 2021-05-24 NOTE — Telephone Encounter (Signed)
-----   Message from Lincoln Brigham, PA-C sent at 05/24/2021  9:05 AM EDT ----- ?Please notify patient that breast US was negative for any abnormalities.  ?

## 2021-05-25 ENCOUNTER — Telehealth: Payer: Self-pay

## 2021-05-25 NOTE — Telephone Encounter (Signed)
T/C to pt to advise of appt for CT scan of C/A/P on Tuesday 05/31/21 at 3:00.  Pt to ck in at [facility] at 2:30  NPO 4 hrs prior.  She will pick up her contrast at [facility] before. ? ?Pt voiced understanding ?

## 2021-05-31 ENCOUNTER — Ambulatory Visit (HOSPITAL_COMMUNITY): Payer: Medicare Other

## 2021-05-31 ENCOUNTER — Ambulatory Visit (HOSPITAL_COMMUNITY)
Admission: RE | Admit: 2021-05-31 | Discharge: 2021-05-31 | Disposition: A | Discharge status: Home / Self Care | Payer: Medicare Other | Source: Ambulatory Visit | Attending: Physician Assistant | Admitting: Physician Assistant

## 2021-05-31 ENCOUNTER — Encounter (HOSPITAL_COMMUNITY): Payer: Self-pay

## 2021-05-31 DIAGNOSIS — J439 Emphysema, unspecified: Secondary | ICD-10-CM | POA: Insufficient documentation

## 2021-05-31 DIAGNOSIS — I2510 Atherosclerotic heart disease of native coronary artery without angina pectoris: Secondary | ICD-10-CM | POA: Insufficient documentation

## 2021-05-31 DIAGNOSIS — R918 Other nonspecific abnormal finding of lung field: Secondary | ICD-10-CM | POA: Insufficient documentation

## 2021-05-31 DIAGNOSIS — Z9081 Acquired absence of spleen: Secondary | ICD-10-CM | POA: Insufficient documentation

## 2021-05-31 DIAGNOSIS — I700 Atherosclerosis of aorta: Secondary | ICD-10-CM | POA: Diagnosis not present

## 2021-05-31 DIAGNOSIS — K5790 Diverticulosis of intestine, part unspecified, without perforation or abscess without bleeding: Secondary | ICD-10-CM | POA: Diagnosis not present

## 2021-05-31 DIAGNOSIS — K5730 Diverticulosis of large intestine without perforation or abscess without bleeding: Secondary | ICD-10-CM | POA: Diagnosis not present

## 2021-05-31 MED ORDER — IOHEXOL 300 MG/ML  SOLN
100.0000 mL | Freq: Once | INTRAMUSCULAR | Status: AC | PRN
  Administered 2021-05-31: 100 mL via INTRAVENOUS

## 2021-05-31 MED ORDER — SODIUM CHLORIDE (PF) 0.9 % IJ SOLN
INTRAMUSCULAR | Status: AC
  Filled 2021-05-31: qty 50

## 2021-06-03 ENCOUNTER — Telehealth: Payer: Self-pay | Admitting: Physician Assistant

## 2021-06-06 ENCOUNTER — Other Ambulatory Visit: Payer: Self-pay | Admitting: Gastroenterology

## 2021-06-07 NOTE — Telephone Encounter (Signed)
I called [name] to review the CT imaging from 05/31/2021. Findings show that left renal lesion is stable and most consistent with a benign process. Additionally the previous seen lung nodules reflect smoking related respiratory bronchiolitis. No further workup is recommended. Patient will follow up with her PCP moving forward. She expressed understanding of the plan provided.  ? ?

## 2021-06-22 ENCOUNTER — Other Ambulatory Visit (HOSPITAL_COMMUNITY): Payer: Self-pay

## 2021-06-24 DIAGNOSIS — E118 Type 2 diabetes mellitus with unspecified complications: Secondary | ICD-10-CM | POA: Diagnosis not present

## 2021-06-24 DIAGNOSIS — E7800 Pure hypercholesterolemia, unspecified: Secondary | ICD-10-CM | POA: Diagnosis not present

## 2021-06-24 DIAGNOSIS — I10 Essential (primary) hypertension: Secondary | ICD-10-CM | POA: Diagnosis not present

## 2021-06-28 DIAGNOSIS — M810 Age-related osteoporosis without current pathological fracture: Secondary | ICD-10-CM | POA: Diagnosis not present

## 2021-06-30 DIAGNOSIS — M791 Myalgia, unspecified site: Secondary | ICD-10-CM | POA: Diagnosis not present

## 2021-06-30 DIAGNOSIS — E1141 Type 2 diabetes mellitus with diabetic mononeuropathy: Secondary | ICD-10-CM | POA: Diagnosis not present

## 2021-06-30 DIAGNOSIS — I2584 Coronary atherosclerosis due to calcified coronary lesion: Secondary | ICD-10-CM | POA: Diagnosis not present

## 2021-06-30 DIAGNOSIS — I10 Essential (primary) hypertension: Secondary | ICD-10-CM | POA: Diagnosis not present

## 2021-06-30 DIAGNOSIS — Z9081 Acquired absence of spleen: Secondary | ICD-10-CM | POA: Diagnosis not present

## 2021-06-30 DIAGNOSIS — T466X5A Adverse effect of antihyperlipidemic and antiarteriosclerotic drugs, initial encounter: Secondary | ICD-10-CM | POA: Diagnosis not present

## 2021-06-30 DIAGNOSIS — Z90411 Acquired partial absence of pancreas: Secondary | ICD-10-CM | POA: Diagnosis not present

## 2021-06-30 DIAGNOSIS — E1165 Type 2 diabetes mellitus with hyperglycemia: Secondary | ICD-10-CM | POA: Diagnosis not present

## 2021-06-30 DIAGNOSIS — E7800 Pure hypercholesterolemia, unspecified: Secondary | ICD-10-CM | POA: Diagnosis not present

## 2021-07-18 ENCOUNTER — Other Ambulatory Visit (HOSPITAL_COMMUNITY): Payer: Self-pay

## 2021-07-18 DIAGNOSIS — R300 Dysuria: Secondary | ICD-10-CM | POA: Diagnosis not present

## 2021-07-18 DIAGNOSIS — R002 Palpitations: Secondary | ICD-10-CM | POA: Diagnosis not present

## 2021-07-20 NOTE — Progress Notes (Unsigned)
Cardiology Office Note:    Date:  07/20/2021   ID:  Renee [name], DOB [birth year], MRN [id number]  PCP:  Renee Pretty, MD   [name] Providers Cardiologist:  Renee Chandler, MD { Click to update primary MD,subspecialty MD or APP then REFRESH:1}    Referring MD: Renee Pretty, MD   Chief Complaint: ***  History of Present Illness:    Renee [name] is a *** 81 y.o. female with a hx of CAD (calcium noted on CT), hypertension, hyperlipidemia, breast cancer, COPD, RBBB, DVT, and carotid artery disease.   Former patient of [name]. Nuclear stress test in 2017 with no ischemia. Mild carotid artery disease by dopplers in 2017. Most recent carotid artery dopplers July 2019 with mild bilateral disease. 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 Intolerant of statins.   She was last seen in our office on 01/04/2021 by Renee [name]. He cleared her for upcoming hip surgery and recommended 12 month follow-up.   Today, she is here PCP referral?  Past Medical History:  Diagnosis Date   Anemia    history only at age 70 yrs olds, no current problems   Aortic atherosclerosis ([name])    Arthritis    oa   Blood transfusion 1948   Blood transfusion without reported diagnosis    Breast cancer ([place]) 1994   bilateral / HX skin cancer   Carotid atherosclerosis    Cataract    both eyes   Constipation    COPD (chronic obstructive pulmonary disease) ([facility])    no meds/no inaler   Depression    Diabetes ([place])    type 2 - diet controlled, no meds   Disc disease, degenerative, cervical    trouble turing head and neck at times   Diverticulosis    DVT (deep venous thrombosis) ([place]) 2002   left leg   Dyspnea    occasional with exertion   Dysrhythmia    Family history of adverse reaction to anesthesia    Fibromyalgia    GAD (generalized anxiety disorder)    GERD (gastroesophageal reflux disease)    History of kidney stones    History of vertigo    Hx of skin cancer, basal  cell    Hyperlipidemia    Hypertension    Measles as child   Mumps age 30   Osteoarthritis    Osteoporosis    Pancreatic cyst    Pancreatitis    Pneumonia    PONV (postoperative nausea and vomiting)    PONV   Pulmonary nodule    Right renal mass    Seborrheic keratosis    Sinus tachycardia    Sinus tachycardia    Tubular adenoma of colon     Past Surgical History:  Procedure Laterality Date   ABDOMINAL HYSTERECTOMY     achilles tendon adn 2 bone spurs removed Left 05/2012   APPENDECTOMY     BACK SURGERY  1976   lower   BASAL CELL CARCINOMA EXCISION     bilateral mastectomy with tramflap reconstruction  1994   BIOPSY  05/13/2020   Procedure: BIOPSY;  Surgeon: Renee Copas., MD;  [name];  Service: Gastroenterology;;   BREAST REDUCTION SURGERY     CARPAL TUNNEL RELEASE Right 03/2017   CATARACT EXTRACTION     COLONOSCOPY     ESOPHAGOGASTRODUODENOSCOPY (EGD) WITH PROPOFOL N/A 05/13/2020   Procedure: ESOPHAGOGASTRODUODENOSCOPY (EGD) WITH PROPOFOL;  Surgeon: Renee Copas., MD;  [name];  Service: Gastroenterology;  Laterality:  N/A;   EUS N/A 05/13/2020   Procedure: UPPER ENDOSCOPIC ULTRASOUND (EUS) RADIAL;  Surgeon: Renee Copas., MD;  [name];  Service: Gastroenterology;  Laterality: N/A;   FOOT SURGERY     KNEE ARTHROSCOPY Right 2008   KNEE SURGERY  2002   arthroscopy-bilateral   LAPAROSCOPIC LIVER ULTRASOUND N/A 08/30/2020   Procedure: LAPAROSCOPIC INTRAOPERATIVE ULTRASOUND;  Surgeon: Renee Bolt, MD;  [name];  Service: General;  Laterality: N/A;   LAPAROSCOPIC SPLENECTOMY N/A 08/30/2020   Procedure: LAPAROSCOPIC SPLENECTOMY;  Surgeon: Renee Bolt, MD;  [facility]: [name];  Service: General;  Laterality: N/A;   MASTECTOMY     bilateral   PANCREATECTOMY N/A 08/30/2020   Procedure: LAPAROSCOPIC DISTAL PANCREATECTOMY;  Surgeon: Renee Bolt, MD;  [name];  Service: General;   Laterality: N/A;   PARTIAL HYSTERECTOMY     POLYPECTOMY     SHOULDER SURGERY Left    TONSILLECTOMY     TOTAL HIP ARTHROPLASTY Left 03/15/2021   Procedure: TOTAL HIP ARTHROPLASTY ANTERIOR APPROACH;  Surgeon: Renee Butters, MD;  [name];  Service: Orthopedics;  Laterality: Left;   TOTAL KNEE ARTHROPLASTY Left 10/14/2012   Procedure: LEFT TOTAL KNEE ARTHROPLASTY;  Surgeon: Renee Alf, MD;  [name];  Service: Orthopedics;  Laterality: Left;   TOTAL KNEE ARTHROPLASTY Right 10/20/2013   Procedure: RIGHT TOTAL KNEE ARTHROPLASTY;  Surgeon: Renee Alf, MD;  [name];  Service: Orthopedics;  Laterality: Right;    Current Medications: No outpatient medications have been marked as taking for the 07/21/21 encounter (Appointment) with Renee [name], Renee Schwab, NP.     Allergies:   Hydrocodone, Alendronate sodium, Atorvastatin, Buspirone hcl, Codeine, Ezetimibe, Ibuprofen, Lisinopril, Temazepam, Tramadol hcl, Trazodone hcl, Valdecoxib, and Aspirin   Social History   Socioeconomic History   Marital status: Married    Spouse name: Not on file   Number of children: 2   Years of education: Not on file   Highest education level: Not on file  Occupational History   Occupation: RETIRED    Employer: RETIRED  Tobacco Use   Smoking status: Former    Packs/day: 1.00    Years: 20.00    Total pack years: 20.00    Types: Cigarettes    Quit date: 02/11/1992    Years since quitting: 29.4   Smokeless tobacco: Never  Vaping Use   Vaping Use: Never used  Substance and Sexual Activity   Alcohol use: Not Currently    Comment: occasional wine-rare   Drug use: No   Sexual activity: Not Currently    Partners: Male    Birth control/protection: Surgical, Post-menopausal    Comment: hYSTERECTOMY  Other Topics Concern   Not on file  Social History Narrative   Not on file   Social Determinants of Health   Financial Resource Strain: Not on file  Food Insecurity: Not on  file  Transportation Needs: Not on file  Physical Activity: Not on file  Stress: Not on file  Social Connections: Not on file     Family History: The patient's ***family history includes Alcohol abuse in her daughter, daughter, and father; Alzheimer's disease in her mother; CAD in her maternal grandfather; COPD in her daughter; Healthy in her brother, grandchild, and sister; Heart disease in her maternal grandfather; Liver disease in her father; Other in her father; Sudden death in her brother and father; Unexplained death in her daughter. There is no history of Colon cancer, Colon polyps, Rectal  cancer, Stomach cancer, or Esophageal cancer.  ROS:   Please see the history of present illness.    *** All other systems reviewed and are negative.  Labs/Other Studies Reviewed:    The following studies were reviewed today:  CT Abdomen/Pelvis 05/2021  1. Stable, definitively benign intrinsically hyperdense, nonenhancing hemorrhagic or proteinaceous cyst of the posterior midportion of the left kidney, for which no further follow-up or characterization is required. No suspicious renal mass or contrast enhancement. 2. Status post splenectomy and distal pancreatectomy. 3. Pulmonary hyperinflation and emphysema. 4. Numerous tiny centrilobular pulmonary nodules, most concentrated in the lung apices, likely smoking-related respiratory bronchiolitis. 5. Diverticulosis without evidence of acute diverticulitis. 6. Coronary artery disease.   Aortic Atherosclerosis (ICD10-I70.0) and Emphysema (ICD10-J43.9).    VAS US Carotid 07/2017  Right Carotid: Velocities in the right ICA are consistent with a 1-39%  stenosis. Non-hemodynamically significant plaque <50% noted in the  CCA. The RICA velocities remain within normal range and essentially  stable compared to the prior exam.   Left Carotid: Velocities in the left ICA are consistent with a 1-39%  stenosis. Non-hemodynamically significant plaque  noted in the CCA. The  LICA velocities remain within normal range and have decreased  compared to the prior exam.   Vertebrals:  Bilateral vertebral arteries demonstrate antegrade flow.  Subclavians: Normal flow hemodynamics were seen in bilateral subclavian               arteries.    Exercise Myoview 05/2015  Nuclear stress EF: 83%. Blood pressure demonstrated a hypertensive response to exercise. Horizontal ST segment depression ST segment depression was noted during stress in the II, III, aVF, V4, V5 and V6 leads. This is a low risk study. The left ventricular ejection fraction is hyperdynamic (>65%).   Poor exercise tolerance achieving less than 5 METS with HTN respnse Positive ECG Perfusion images with breast attenuation no ischemia or infarct EF 83%     Recent Labs: Creatinine 0.73, K+ 4.8 Na+ 140, ALT 12, AST 15  Recent Lipid Panel 06/24/21 PCP LDL 95, HDL 66, Trigs 223 A1C 7.1 Hgb 13.8, PLT 409  Risk Assessment/Calculations:   {Does this patient have ATRIAL FIBRILLATION?:[phone]}       Physical Exam:    VS:  LMP  (LMP Unknown)     Wt Readings from Last 3 Encounters:  05/17/21 147 lb 11.2 oz (67 kg)  03/15/21 146 lb 2.6 oz (66.3 kg)  03/02/21 146 lb 3.2 oz (66.3 kg)     GEN: *** Well nourished, well developed in no acute distress HEENT: Normal NECK: No JVD; No carotid bruits CARDIAC: ***RRR, no murmurs, rubs, gallops RESPIRATORY:  Clear to auscultation without rales, wheezing or rhonchi  ABDOMEN: Soft, non-tender, non-distended MUSCULOSKELETAL:  No edema; No deformity. *** pedal pulses, ***bilaterally SKIN: Warm and dry NEUROLOGIC:  Alert and oriented x 3 PSYCHIATRIC:  Normal affect   EKG:  EKG is *** ordered today.  The ekg ordered today demonstrates ***  Diagnoses:    No diagnosis found. Assessment and Plan:     CAD/Coronary calcification seen on CT: Hypertension: Hyperlipidemia LDL goal < 70: LDL 95 06/04/21  {Are you ordering a CV  Procedure (e.g. stress test, cath, DCCV, TEE, etc)?   Press F2        :[id number]}    Medication Adjustments/Labs and Tests Ordered: Current medicines are reviewed at length with the patient today.  Concerns regarding medicines are outlined above.  No orders of the defined types were  placed in this encounter.  No orders of the defined types were placed in this encounter.   There are no Patient Instructions on file for this visit.   Signed, Emmaline Life, NP  07/20/2021 7:53 AM    [place]

## 2021-07-21 ENCOUNTER — Encounter: Payer: Self-pay | Admitting: Nurse Practitioner

## 2021-07-21 ENCOUNTER — Ambulatory Visit (INDEPENDENT_AMBULATORY_CARE_PROVIDER_SITE_OTHER): Payer: Medicare Other | Admitting: Nurse Practitioner

## 2021-07-21 VITALS — BP 114/70 | HR 81 | Ht 65.0 in | Wt 148.8 lb

## 2021-07-21 DIAGNOSIS — R0609 Other forms of dyspnea: Secondary | ICD-10-CM | POA: Diagnosis not present

## 2021-07-21 DIAGNOSIS — E785 Hyperlipidemia, unspecified: Secondary | ICD-10-CM

## 2021-07-21 DIAGNOSIS — I2510 Atherosclerotic heart disease of native coronary artery without angina pectoris: Secondary | ICD-10-CM | POA: Diagnosis not present

## 2021-07-21 DIAGNOSIS — I10 Essential (primary) hypertension: Secondary | ICD-10-CM

## 2021-07-21 DIAGNOSIS — R002 Palpitations: Secondary | ICD-10-CM | POA: Diagnosis not present

## 2021-07-21 DIAGNOSIS — I4510 Unspecified right bundle-branch block: Secondary | ICD-10-CM

## 2021-07-21 MED ORDER — ASPIRIN 81 MG PO TBEC
81.0000 mg | DELAYED_RELEASE_TABLET | Freq: Every day | ORAL | 3 refills | Status: AC
Start: 2021-07-21 — End: ?

## 2021-07-21 NOTE — Patient Instructions (Signed)
Medication Instructions:   Your physician recommends that you continue on your current medications as directed. Please refer to the Current Medication list given to you today.   *If you need a refill on your cardiac medications before your next appointment, please call your pharmacy*   Lab Work:  None ordered.  If you have labs (blood work) drawn today and your tests are completely normal, you will receive your results only by: [place] (if you have MyChart) OR A paper copy in the mail If you have any lab test that is abnormal or we need to change your treatment, we will call you to review the results.   Testing/Procedures:  [facility]- Long Term Monitor Instructions  Your physician has requested you wear a ZIO patch monitor for 14 days.  This is a single patch monitor. [employer] supplies one patch monitor per enrollment. Additional stickers are not available. Please do not apply patch if you will be having a Nuclear Stress Test,  Echocardiogram, Cardiac CT, MRI, or Chest Xray during the period you would be wearing the  monitor. The patch cannot be worn during these tests. You cannot remove and re-apply the  ZIO XT patch monitor.  Your ZIO patch monitor will be mailed 3 day USPS to your address on file. It may take 3-5 days  to receive your monitor after you have been enrolled.  Once you have received your monitor, please review the enclosed instructions. Your monitor  has already been registered assigning a specific monitor serial # to you.  Billing and Patient Assistance Program Information  We have supplied [employer] with any of your insurance information on file for billing purposes. [employer] offers a sliding scale Patient Assistance Program for patients that do not have  insurance, or whose insurance does not completely cover the cost of the ZIO monitor.  You must apply for the Patient Assistance Program to qualify for this discounted rate.  To apply, please call [employer] at  [phone], select option 4, select option 2, ask to apply for  Patient Assistance Program. Theodore Demark will ask your household income, and how many people  are in your household. They will quote your out-of-pocket cost based on that information.  [employer] will also be able to set up a 62-month, interest-free payment plan if needed.  Applying the monitor   Shave hair from upper left chest.  Hold abrader disc by orange tab. Rub abrader in 40 strokes over the upper left chest as  indicated in your monitor instructions.  Clean area with 4 enclosed alcohol pads. Let dry.  Apply patch as indicated in monitor instructions. Patch will be placed under collarbone on left  side of chest with arrow pointing upward.  Rub patch adhesive wings for 2 minutes. Remove white label marked "1". Remove the white  label marked "2". Rub patch adhesive wings for 2 additional minutes.  While looking in a mirror, press and release button in center of patch. A small green light will  flash 3-4 times. This will be your only indicator that the monitor has been turned on.  Do not shower for the first 24 hours. You may shower after the first 24 hours.  Press the button if you feel a symptom. You will hear a small click. Record Date, Time and  Symptom in the Patient Logbook.  When you are ready to remove the patch, follow instructions on the last 2 pages of Patient  Logbook. Stick patch monitor onto the last page of Patient  Logbook.  Place Patient Logbook in the blue and white box. Use locking tab on box and tape box closed  securely. The blue and white box has prepaid postage on it. Please place it in the mailbox as  soon as possible. Your physician should have your test results approximately 7 days after the  monitor has been mailed back to [facility].  Call [facility] at [phone] if you have questions regarding  your ZIO XT patch monitor. Call them immediately if you see an orange light  blinking on your  monitor.  If your monitor falls off in less than 4 days, contact our Monitor department at [phone].  If your monitor becomes loose or falls off after 4 days call [employer] at [phone] for  suggestions on securing your monitor  Your physician has requested that you have an echocardiogram. Echocardiography is a painless test that uses sound waves to create images of your heart. It provides your doctor with information about the size and shape of your heart and how well your heart's chambers and valves are working. This procedure takes approximately one hour. There are no restrictions for this procedure.PLEASE MAKE APPT TO HAVE ZIO PLACED AFTER ECHO.     Follow-Up: At [facility], you and your health needs are our priority.  As part of our continuing mission to provide you with exceptional heart care, we have created designated Provider Care Teams.  These Care Teams include your primary Cardiologist (physician) and Advanced Practice Providers (APPs -  Physician Assistants and Nurse Practitioners) who all work together to provide you with the care you need, when you need it.  We recommend signing up for the patient portal called "MyChart".  Sign up information is provided on this After Visit Summary.  MyChart is used to connect with patients for Virtual Visits (Telemedicine).  Patients are able to view lab/test results, encounter notes, upcoming appointments, etc.  Non-urgent messages can be sent to your provider as well.   To learn more about what you can do with MyChart, go to [email or web address].    Your next appointment:   6 month(s)  The format for your next appointment:   In Person  Provider:   Lauree Chandler, MD     Important Information About Sugar

## 2021-07-22 ENCOUNTER — Encounter: Payer: Self-pay | Admitting: Nurse Practitioner

## 2021-07-22 ENCOUNTER — Other Ambulatory Visit: Payer: Self-pay | Admitting: *Deleted

## 2021-07-22 DIAGNOSIS — R42 Dizziness and giddiness: Secondary | ICD-10-CM

## 2021-07-22 DIAGNOSIS — R0609 Other forms of dyspnea: Secondary | ICD-10-CM

## 2021-07-22 DIAGNOSIS — R002 Palpitations: Secondary | ICD-10-CM

## 2021-07-28 ENCOUNTER — Other Ambulatory Visit: Payer: Self-pay | Admitting: Surgery

## 2021-07-28 DIAGNOSIS — D490 Neoplasm of unspecified behavior of digestive system: Secondary | ICD-10-CM

## 2021-07-29 ENCOUNTER — Ambulatory Visit (HOSPITAL_COMMUNITY): Payer: Medicare Other | Attending: Cardiology

## 2021-07-29 ENCOUNTER — Ambulatory Visit (INDEPENDENT_AMBULATORY_CARE_PROVIDER_SITE_OTHER): Payer: Medicare Other

## 2021-07-29 ENCOUNTER — Ambulatory Visit: Payer: Medicare Other

## 2021-07-29 ENCOUNTER — Other Ambulatory Visit: Payer: Medicare Other

## 2021-07-29 DIAGNOSIS — I2510 Atherosclerotic heart disease of native coronary artery without angina pectoris: Secondary | ICD-10-CM

## 2021-07-29 DIAGNOSIS — R002 Palpitations: Secondary | ICD-10-CM | POA: Diagnosis not present

## 2021-07-29 DIAGNOSIS — I10 Essential (primary) hypertension: Secondary | ICD-10-CM | POA: Insufficient documentation

## 2021-07-29 DIAGNOSIS — R0609 Other forms of dyspnea: Secondary | ICD-10-CM

## 2021-07-29 DIAGNOSIS — E785 Hyperlipidemia, unspecified: Secondary | ICD-10-CM | POA: Insufficient documentation

## 2021-07-29 DIAGNOSIS — R42 Dizziness and giddiness: Secondary | ICD-10-CM

## 2021-07-29 DIAGNOSIS — I4510 Unspecified right bundle-branch block: Secondary | ICD-10-CM | POA: Diagnosis not present

## 2021-07-29 LAB — ECHOCARDIOGRAM COMPLETE
Area-P 1/2: 2.91 cm2
S' Lateral: 3.7 cm

## 2021-07-29 NOTE — Progress Notes (Unsigned)
Applied a 14 day Zio XT monitor to patient in the office ° °Dr. McAlhany to read °

## 2021-07-30 LAB — TSH: TSH: 1.98 u[IU]/mL (ref 0.450–4.500)

## 2021-08-11 DIAGNOSIS — I4510 Unspecified right bundle-branch block: Secondary | ICD-10-CM | POA: Diagnosis not present

## 2021-08-11 DIAGNOSIS — E785 Hyperlipidemia, unspecified: Secondary | ICD-10-CM | POA: Diagnosis not present

## 2021-08-11 DIAGNOSIS — I10 Essential (primary) hypertension: Secondary | ICD-10-CM | POA: Diagnosis not present

## 2021-08-11 DIAGNOSIS — I2510 Atherosclerotic heart disease of native coronary artery without angina pectoris: Secondary | ICD-10-CM | POA: Diagnosis not present

## 2021-08-12 ENCOUNTER — Ambulatory Visit
Admission: RE | Admit: 2021-08-12 | Discharge: 2021-08-12 | Disposition: A | Discharge status: Home / Self Care | Payer: Medicare Other | Source: Ambulatory Visit | Attending: Surgery | Admitting: Surgery

## 2021-08-12 DIAGNOSIS — K5730 Diverticulosis of large intestine without perforation or abscess without bleeding: Secondary | ICD-10-CM | POA: Diagnosis not present

## 2021-08-12 DIAGNOSIS — R160 Hepatomegaly, not elsewhere classified: Secondary | ICD-10-CM | POA: Diagnosis not present

## 2021-08-12 DIAGNOSIS — D490 Neoplasm of unspecified behavior of digestive system: Secondary | ICD-10-CM

## 2021-08-12 MED ORDER — GADOBENATE DIMEGLUMINE 529 MG/ML IV SOLN
14.0000 mL | Freq: Once | INTRAVENOUS | Status: AC | PRN
Start: 2021-08-12 — End: 2021-08-12
  Administered 2021-08-12: 14 mL via INTRAVENOUS

## 2021-08-18 ENCOUNTER — Other Ambulatory Visit (HOSPITAL_COMMUNITY): Payer: Self-pay

## 2021-08-18 ENCOUNTER — Telehealth: Payer: Self-pay | Admitting: *Deleted

## 2021-08-18 NOTE — Telephone Encounter (Signed)
-----   Message from Burnell Blanks, MD sent at 08/12/2021  2:45 PM EDT ----- Sinus with several short runs of SVT. This is benign but explains her palpitations. Continue beta blocker for now. She can take an extra half pill (25 mg) of metoprolol if she feels her heart racing. Also review vagal maneuvers. cdm

## 2021-08-18 NOTE — Telephone Encounter (Signed)
Left message for patient to call back  

## 2021-08-19 NOTE — Telephone Encounter (Signed)
Spoke with patient and advised of review and recommendations from Dr. Angelena Form regarding her monitor.  She voices understanding and agreement

## 2021-08-19 NOTE — Telephone Encounter (Signed)
Patient is returning call. Please advise? 

## 2021-08-27 ENCOUNTER — Other Ambulatory Visit (HOSPITAL_COMMUNITY): Payer: Self-pay

## 2021-08-30 ENCOUNTER — Other Ambulatory Visit: Payer: Self-pay | Admitting: Surgery

## 2021-08-30 DIAGNOSIS — Z90411 Acquired partial absence of pancreas: Secondary | ICD-10-CM | POA: Diagnosis not present

## 2021-08-30 DIAGNOSIS — L910 Hypertrophic scar: Secondary | ICD-10-CM | POA: Diagnosis not present

## 2021-08-30 DIAGNOSIS — L989 Disorder of the skin and subcutaneous tissue, unspecified: Secondary | ICD-10-CM | POA: Diagnosis not present

## 2021-08-30 DIAGNOSIS — D490 Neoplasm of unspecified behavior of digestive system: Secondary | ICD-10-CM | POA: Diagnosis not present

## 2021-08-31 DIAGNOSIS — M79652 Pain in left thigh: Secondary | ICD-10-CM | POA: Diagnosis not present

## 2021-09-14 ENCOUNTER — Other Ambulatory Visit: Payer: Self-pay | Admitting: Sports Medicine

## 2021-09-14 ENCOUNTER — Other Ambulatory Visit (HOSPITAL_COMMUNITY): Payer: Self-pay

## 2021-09-14 ENCOUNTER — Ambulatory Visit
Admission: RE | Admit: 2021-09-14 | Discharge: 2021-09-14 | Disposition: A | Discharge status: Home / Self Care | Payer: Medicare Other | Source: Ambulatory Visit | Attending: Sports Medicine | Admitting: Sports Medicine

## 2021-09-14 DIAGNOSIS — M79652 Pain in left thigh: Secondary | ICD-10-CM | POA: Diagnosis not present

## 2021-09-14 DIAGNOSIS — M25462 Effusion, left knee: Secondary | ICD-10-CM | POA: Diagnosis not present

## 2021-09-27 DIAGNOSIS — I10 Essential (primary) hypertension: Secondary | ICD-10-CM | POA: Diagnosis not present

## 2021-09-27 DIAGNOSIS — E7800 Pure hypercholesterolemia, unspecified: Secondary | ICD-10-CM | POA: Diagnosis not present

## 2021-09-27 DIAGNOSIS — Z90411 Acquired partial absence of pancreas: Secondary | ICD-10-CM | POA: Diagnosis not present

## 2021-09-27 DIAGNOSIS — E1165 Type 2 diabetes mellitus with hyperglycemia: Secondary | ICD-10-CM | POA: Diagnosis not present

## 2021-09-28 DIAGNOSIS — R160 Hepatomegaly, not elsewhere classified: Secondary | ICD-10-CM | POA: Diagnosis not present

## 2021-09-28 DIAGNOSIS — E875 Hyperkalemia: Secondary | ICD-10-CM | POA: Diagnosis not present

## 2021-10-04 DIAGNOSIS — E875 Hyperkalemia: Secondary | ICD-10-CM | POA: Diagnosis not present

## 2021-10-04 DIAGNOSIS — I2584 Coronary atherosclerosis due to calcified coronary lesion: Secondary | ICD-10-CM | POA: Diagnosis not present

## 2021-10-04 DIAGNOSIS — M791 Myalgia, unspecified site: Secondary | ICD-10-CM | POA: Diagnosis not present

## 2021-10-04 DIAGNOSIS — E1141 Type 2 diabetes mellitus with diabetic mononeuropathy: Secondary | ICD-10-CM | POA: Diagnosis not present

## 2021-10-04 DIAGNOSIS — E1165 Type 2 diabetes mellitus with hyperglycemia: Secondary | ICD-10-CM | POA: Diagnosis not present

## 2021-10-04 DIAGNOSIS — Z9081 Acquired absence of spleen: Secondary | ICD-10-CM | POA: Diagnosis not present

## 2021-10-04 DIAGNOSIS — I10 Essential (primary) hypertension: Secondary | ICD-10-CM | POA: Diagnosis not present

## 2021-10-04 DIAGNOSIS — Z90411 Acquired partial absence of pancreas: Secondary | ICD-10-CM | POA: Diagnosis not present

## 2021-10-04 DIAGNOSIS — E7800 Pure hypercholesterolemia, unspecified: Secondary | ICD-10-CM | POA: Diagnosis not present

## 2021-10-04 DIAGNOSIS — T466X5A Adverse effect of antihyperlipidemic and antiarteriosclerotic drugs, initial encounter: Secondary | ICD-10-CM | POA: Diagnosis not present

## 2021-10-12 ENCOUNTER — Encounter: Payer: Self-pay | Admitting: Internal Medicine

## 2021-10-12 ENCOUNTER — Ambulatory Visit (INDEPENDENT_AMBULATORY_CARE_PROVIDER_SITE_OTHER): Payer: Medicare Other | Admitting: Internal Medicine

## 2021-10-12 VITALS — BP 110/52 | HR 90 | Ht 64.5 in | Wt 145.8 lb

## 2021-10-12 DIAGNOSIS — K2100 Gastro-esophageal reflux disease with esophagitis, without bleeding: Secondary | ICD-10-CM | POA: Diagnosis not present

## 2021-10-12 DIAGNOSIS — D490 Neoplasm of unspecified behavior of digestive system: Secondary | ICD-10-CM | POA: Diagnosis not present

## 2021-10-12 DIAGNOSIS — Z86010 Personal history of colonic polyps: Secondary | ICD-10-CM | POA: Diagnosis not present

## 2021-10-12 MED ORDER — PANTOPRAZOLE SODIUM 40 MG PO TBEC: 40.0000 mg | DELAYED_RELEASE_TABLET | Freq: Every day | ORAL | 3 refills | Status: DC

## 2021-10-12 NOTE — Progress Notes (Signed)
Subjective:    Patient ID: Renee [name], female    DOB: Jun 02, 1940, 81 y.o.   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  She is here alone today and she was last seen here in the office in October 2022.  She reports that on the whole she is feeling well.  She occasionally has right upper quadrant discomfort which she notices more during movement while she is sleeping.  It is not hurting her today and has not hurt her in the last several days.  She is not having any issues with nausea or vomiting.  She backed down on the twice daily pantoprazole to once daily by herself several months ago and has not had heartburn or indigestion.  No dysphagia symptom.  She does not have a great appetite but is able to eat 3 meals a day.  She can feel hunger Renee in the morning.  Her bowel movements have been regular and she has not had blood in her stool or melena.  She recently had a biopsy of a scar from her distal pancreatectomy and this was a benign keloid.  Review of Systems As per HPI, otherwise negative  Current Medications, Allergies, Past Medical History, Past Surgical History, Family History and Social History were reviewed in [employer] record.    Objective:   Physical Exam BP (!) 110/52   Pulse 90   Ht 5' 4.5" (1.638 m)   Wt 145 lb 12.8 oz (66.1 kg)   LMP  (LMP Unknown)   BMI 24.64 kg/m  Gen: awake, alert, NAD HEENT: anicteric  CV: RRR, no mrg Pulm: CTA b/l Abd: soft, NT/ND, +BS throughout Ext: no c/c/e Neuro: nonfocal     Latest Ref Rng & Units 05/17/2021   11:50 AM 03/02/2021    1:44 PM 11/09/2020    9:38 AM  CBC  WBC  4.0 - 10.5 K/uL 7.8  10.0  11.7   Hemoglobin 12.0 - 15.0 g/dL 14.5  15.1  13.8   Hematocrit 36.0 - 46.0 % 45.0  46.4  42.5   Platelets 150 - 400 K/uL 384  462  454.0    CMP     Component Value Date/Time   NA 140 05/17/2021 1150   NA 138 10/29/2018 1006   K 3.7 05/17/2021 1150   CL 101 05/17/2021 1150   CO2 31 05/17/2021 1150   GLUCOSE 160 (H) 05/17/2021 1150   BUN 14 05/17/2021 1150   BUN 12 10/29/2018 1006   CREATININE 0.77 05/17/2021 1150   CREATININE 0.67 11/16/2014 0846   CALCIUM 10.0 05/17/2021 1150   PROT 7.8 05/17/2021 1150   PROT 6.6 02/03/2019 1017   ALBUMIN 4.3 05/17/2021 1150   ALBUMIN 4.3 02/03/2019 1017   AST 16 05/17/2021 1150   ALT 7 05/17/2021 1150   ALKPHOS 76 05/17/2021 1150   BILITOT 0.4 05/17/2021 1150   GFRNONAA >60 05/17/2021 1150   GFRAA >60 04/11/2019 0924    CT CHEST WITH CONTRAST   CT ABDOMEN AND PELVIS WITH AND WITHOUT CONTRAST   TECHNIQUE: Multidetector CT imaging of the chest was performed during intravenous contrast administration. Multidetector CT imaging of the abdomen and pelvis was performed following the standard protocol before  and during bolus administration of intravenous contrast.   RADIATION DOSE REDUCTION: This exam was performed according to the departmental dose-optimization program which includes automated exposure control, adjustment of the mA and/or kV according to patient size and/or use of iterative reconstruction technique.   CONTRAST:  120mL OMNIPAQUE IOHEXOL 300 MG/ML SOLN, additional oral enteric contrast   COMPARISON:  CT abdomen pelvis, 10/13/2020, MR abdomen, 08/04/2020   FINDINGS: CT CHEST FINDINGS   Cardiovascular: Aortic atherosclerosis. Normal heart size. Three-vessel coronary artery calcifications. No pericardial effusion.   Mediastinum/Nodes: No enlarged mediastinal, hilar, or axillary lymph nodes. Thyroid gland, trachea, and esophagus demonstrate no significant findings.   Lungs/Pleura:  Pulmonary hyperinflation. Mild paraseptal emphysema. Numerous tiny centrilobular pulmonary nodules, most concentrated in the lung apices. No pleural effusion or pneumothorax.   Musculoskeletal: No chest wall mass or suspicious osseous lesions identified. Status post bilateral mastectomy with flap reconstruction.   CT ABDOMEN PELVIS FINDINGS   Hepatobiliary: No solid liver abnormality is seen. No gallstones, gallbladder wall thickening, or biliary dilatation.   Pancreas: Status post distal pancreatectomy. No pancreatic ductal dilatation or surrounding inflammatory changes.   Spleen: Status post splenectomy. Small focus of fat necrosis in the left upper quadrant adjacent to a previously noted drain tract (series 8, image 30).   Adrenals/Urinary Tract: Adrenal glands are unremarkable. Stable, definitively benign intrinsically hyperdense, nonenhancing hemorrhagic or proteinaceous cyst of the posterior midportion of the left kidney measuring 1.1 cm (series 4, image 32). No right renal mass or other lesion. Kidneys are otherwise normal, without renal calculi, solid lesion, or hydronephrosis. Bladder is unremarkable.   Stomach/Bowel: Stomach is within normal limits. Appendix is not clearly visualized. No evidence of bowel wall thickening, distention, or inflammatory changes. Severe descending and sigmoid diverticulosis.   Vascular/Lymphatic: Aortic atherosclerosis. No enlarged abdominal or pelvic lymph nodes.   Reproductive: Status post hysterectomy.   Other: No abdominal wall hernia or abnormality. No ascites.   Musculoskeletal: No acute osseous findings. Status post left hip total arthroplasty.   IMPRESSION: 1. Stable, definitively benign intrinsically hyperdense, nonenhancing hemorrhagic or proteinaceous cyst of the posterior midportion of the left kidney, for which no further follow-up or characterization is required. No suspicious renal mass or contrast enhancement. 2.  Status post splenectomy and distal pancreatectomy. 3. Pulmonary hyperinflation and emphysema. 4. Numerous tiny centrilobular pulmonary nodules, most concentrated in the lung apices, likely smoking-related respiratory bronchiolitis. 5. Diverticulosis without evidence of acute diverticulitis. 6. Coronary artery disease.   Aortic Atherosclerosis (ICD10-I70.0) and Emphysema (ICD10-J43.9).   Electronically Signed: By: Delanna Ahmadi M.D. 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   History of pancreatic tail IPMN with high-grade dysplasia and prior pancreatitis --she has done very well since her distal pancreatectomy and splenectomy.  Fortunately the cyst was still benign but we discussed how it had precancerous changes and would likely have progressed to become a malignancy.  She is very thankful that she went ahead and had the surgery.  Her liver enzymes have normalized and her recent cross-sectional imaging looks stable and very reassuring. -- No further follow-up imaging recommended based on recent imaging, normalization after distal pancreatectomy without further pancreatic concerning features and age  53.  GERD/indigestion --mild esophagitis  seen at the time of EUS.  She had backed down to pantoprazole once per day which is sufficient to control symptoms and esophagitis. -- Reduce pantoprazole to 40 mg once daily  3.  History of adenomatous colon polyps --we discussed the risk versus benefit of recurrent colonoscopy for polyp surveillance.  We discussed how based on current age that the risk benefit is likely even to possibly slightly more risk than  benefit.  With this in mind I do not think that she would medically benefit from repeat surveillance colonoscopy.  We discussed this and she is in agreement -- Colonoscopy for polyp surveillance discontinued based on age; this does not mean that we would not fully evaluate troublesome GI symptoms including consider colonoscopy should that be indicated in the future.  1 year follow-up, sooner if needed  30 minutes total spent today including patient facing time, coordination of care, reviewing medical history/procedures/pertinent radiology studies, and documentation of the encounter.

## 2021-10-12 NOTE — Patient Instructions (Signed)
_______________________________________________________  If you are age 81 or older, your body mass index should be between 23-30. Your Body mass index is 24.64 kg/m. If this is out of the aforementioned range listed, please consider follow up with your Primary Care Provider.  If you are age 35 or younger, your body mass index should be between 19-25. Your Body mass index is 24.64 kg/m. If this is out of the aformentioned range listed, please consider follow up with your Primary Care Provider.   ________________________________________________________  The [facility] providers would like to encourage you to use [name] to communicate with providers for non-urgent requests or questions.  Due to long hold times on the telephone, sending your provider a message by [facility] may be a faster and more efficient way to get a response.  Please allow 48 business hours for a response.  Please remember that this is for non-urgent requests.  _______________________________________________________  We have sent the following medications to your pharmacy for you to pick up at your convenience:  Pantoprazole - once a day  Please follow up in one year

## 2021-10-20 ENCOUNTER — Other Ambulatory Visit (HOSPITAL_COMMUNITY): Payer: Self-pay

## 2021-11-10 DIAGNOSIS — M79652 Pain in left thigh: Secondary | ICD-10-CM | POA: Diagnosis not present

## 2021-11-10 DIAGNOSIS — Z96642 Presence of left artificial hip joint: Secondary | ICD-10-CM | POA: Diagnosis not present

## 2021-11-14 ENCOUNTER — Other Ambulatory Visit (HOSPITAL_COMMUNITY): Payer: Self-pay

## 2021-11-18 DIAGNOSIS — Z23 Encounter for immunization: Secondary | ICD-10-CM | POA: Diagnosis not present

## 2021-11-21 ENCOUNTER — Other Ambulatory Visit (HOSPITAL_COMMUNITY): Payer: Self-pay

## 2021-11-25 ENCOUNTER — Other Ambulatory Visit (HOSPITAL_COMMUNITY): Payer: Self-pay

## 2021-12-10 ENCOUNTER — Other Ambulatory Visit (HOSPITAL_COMMUNITY): Payer: Self-pay

## 2022-01-01 NOTE — Progress Notes (Unsigned)
No chief complaint on file.  History of Present Illness: 80 yo female with history of breast cancer, fibromyalgia, CAD (calcium noted on CT), HTN, HLD, DVT, COPD, RBBB, SVT and carotid artery disease here today for follow up. She had been followed in our office by Dr. [name]. I met her in December 2022. Nuclear stress test in 2017 with no ischemia. 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 She was seen in our office in June 2023 for evaluation of palpitations. Echo June 2023 with LVEF=60-65%. Aortic valve sclerosis with no stenosis. Cardiac monitor June 2023 with sinus, 5 runs of SVT-longest 15 seconds, PACs.   She is here today for follow up. The patient denies any chest pain, dyspnea, palpitations, lower extremity edema, orthopnea, PND, dizziness, near syncope or syncope.   Primary Care Physician: Deland Pretty, MD   Past Medical History:  Diagnosis Date   Anemia    history only at age 33 yrs olds, no current problems   Aortic atherosclerosis ([place])    Arthritis    oa   Blood transfusion 1948   Blood transfusion without reported diagnosis    Breast cancer ([place]) 1994   bilateral / HX skin cancer   Carotid atherosclerosis    Cataract    both eyes   Constipation    COPD (chronic obstructive pulmonary disease) ([facility])    no meds/no inaler   Depression    Diabetes ([place])    type 2 - diet controlled, no meds   Disc disease, degenerative, cervical    trouble turing head and neck at times   Diverticulosis    DVT (deep venous thrombosis) ([place]) 2002   left leg   Dyspnea    occasional with exertion   Dysrhythmia    Family history of adverse reaction to anesthesia    Fibromyalgia    GAD (generalized anxiety disorder)    GERD (gastroesophageal reflux disease)    History of kidney stones    History of vertigo    Hx of skin cancer, basal cell    Hyperlipidemia    Hypertension    Measles as child    Mumps age 62   Osteoarthritis    Osteoporosis    Pancreatic cyst    Pancreatitis    Pneumonia    PONV (postoperative nausea and vomiting)    PONV   Pulmonary nodule    Right renal mass    Seborrheic keratosis    Sinus tachycardia    Sinus tachycardia    Tubular adenoma of colon     Past Surgical History:  Procedure Laterality Date   ABDOMINAL HYSTERECTOMY     achilles tendon adn 2 bone spurs removed Left 05/2012   APPENDECTOMY     BACK SURGERY  1976   lower   BASAL CELL CARCINOMA EXCISION     bilateral mastectomy with tramflap reconstruction  1994   BIOPSY  05/13/2020   Procedure: BIOPSY;  Surgeon: Irving Copas., MD;  [facility] ENDOSCOPY;  Service: Gastroenterology;;   BREAST REDUCTION SURGERY     CARPAL TUNNEL RELEASE Right 03/2017   CATARACT EXTRACTION     COLONOSCOPY     ESOPHAGOGASTRODUODENOSCOPY (EGD) WITH PROPOFOL N/A 05/13/2020   Procedure: ESOPHAGOGASTRODUODENOSCOPY (EGD) WITH PROPOFOL;  Surgeon: Irving Copas., MD;  Location: [facility] ENDOSCOPY;  Service: Gastroenterology;  Laterality: N/A;   EUS N/A 05/13/2020   Procedure: UPPER ENDOSCOPIC ULTRASOUND (EUS) RADIAL;  Surgeon: Justice Britain  Brooke Bonito., MD;  Location: [place];  Service: Gastroenterology;  Laterality: N/A;   FOOT SURGERY     KNEE ARTHROSCOPY Right 2008   KNEE SURGERY  2002   arthroscopy-bilateral   LAPAROSCOPIC LIVER ULTRASOUND N/A 08/30/2020   Procedure: LAPAROSCOPIC INTRAOPERATIVE ULTRASOUND;  Surgeon: Dwan Bolt, MD;  Location: [place];  Service: General;  Laterality: N/A;   LAPAROSCOPIC SPLENECTOMY N/A 08/30/2020   Procedure: LAPAROSCOPIC SPLENECTOMY;  Surgeon: Dwan Bolt, MD;  Location: [place];  Service: General;  Laterality: N/A;   MASTECTOMY     bilateral   PANCREATECTOMY N/A 08/30/2020   Procedure: LAPAROSCOPIC DISTAL PANCREATECTOMY;  Surgeon: Dwan Bolt, MD;  Location: [place];  Service: General;  Laterality: N/A;   PARTIAL HYSTERECTOMY     POLYPECTOMY      SHOULDER SURGERY Left    TONSILLECTOMY     TOTAL HIP ARTHROPLASTY Left 03/15/2021   Procedure: TOTAL HIP ARTHROPLASTY ANTERIOR APPROACH;  Surgeon: [name], MD;  Location: [facility];  Service: Orthopedics;  Laterality: Left;   TOTAL KNEE ARTHROPLASTY Left 10/14/2012   Procedure: LEFT TOTAL KNEE ARTHROPLASTY;  Surgeon: Gearlean Alf, MD;  Location: [facility];  Service: Orthopedics;  Laterality: Left;   TOTAL KNEE ARTHROPLASTY Right 10/20/2013   Procedure: RIGHT TOTAL KNEE ARTHROPLASTY;  Surgeon: Gearlean Alf, MD;  Location: [facility];  Service: Orthopedics;  Laterality: Right;    Current Outpatient Medications  Medication Sig Dispense Refill   aspirin EC 81 MG tablet Take 1 tablet (81 mg total) by mouth daily. Swallow whole. 90 tablet 3   Calcium Carbonate-Vit D-Min (CALCIUM 1200 PO) Take 1,200 mg by mouth daily.     cholecalciferol (VITAMIN D3) 25 MCG (1000 UNIT) tablet Take 1,000 Units by mouth daily.     denosumab (PROLIA) 60 MG/ML SOSY injection Inject 60 mg into the skin every 6 (six) months.      Evolocumab (REPATHA SURECLICK) 371 MG/ML SOAJ Inject 1 ml under the skin every 14 days as directed. 6 mL 3   metFORMIN (GLUCOPHAGE-XR) 500 MG 24 hr tablet Take 1 tablet by mouth twice daily with food. 180 tablet 3   metoprolol tartrate (LOPRESSOR) 50 MG tablet Take1 tablet by mouth twice a day. 180 tablet 3   Multiple Vitamin (MULTIVITAMIN) tablet Take 1 tablet by mouth daily.     pantoprazole (PROTONIX) 40 MG tablet Take 1 tablet (40 mg total) by mouth daily. 90 tablet 3   No current facility-administered medications for this visit.    Allergies  Allergen Reactions   Hydrocodone Nausea And Vomiting   Ibuprofen Hives    Blisters    Alendronate Sodium     Other reaction(s): worse reflux   Atorvastatin Other (See Comments)    Pt reports causes "muscle aches."   Buspirone Hcl     Other reaction(s): headache   Codeine Itching    Mood changes, can take percocet   Ezetimibe      Other reaction(s): myalgias   Lisinopril Cough   Temazepam     Other reaction(s): daytime drowsiness   Tramadol Hcl     Other reaction(s): dizzy   Trazodone Hcl     Other reaction(s): nightmares   Valdecoxib     Other reaction(s): stomach pain   Aspirin Nausea Only    Patient able to take lower dose, Aspirin 36m     Social History   Socioeconomic History   Marital status: Married    Spouse name: Not on file   Number  of children: 2   Years of education: Not on file   Highest education level: Not on file  Occupational History   Occupation: RETIRED    Employer: RETIRED  Tobacco Use   Smoking status: Former    Packs/day: 1.00    Years: 20.00    Total pack years: 20.00    Types: Cigarettes    Quit date: 02/11/1992    Years since quitting: 29.9   Smokeless tobacco: Never  Vaping Use   Vaping Use: Never used  Substance and Sexual Activity   Alcohol use: Not Currently    Comment: occasional wine-rare   Drug use: No   Sexual activity: Not Currently    Partners: Male    Birth control/protection: Surgical, Post-menopausal    Comment: hYSTERECTOMY  Other Topics Concern   Not on file  Social History Narrative   Not on file   Social Determinants of Health   Financial Resource Strain: Not on file  Food Insecurity: Not on file  Transportation Needs: Not on file  Physical Activity: Not on file  Stress: Not on file  Social Connections: Not on file  Intimate Partner Violence: Not on file    Family History  Problem Relation Age of Onset   Alzheimer's disease Mother    Alcohol abuse Father    Sudden death Father    Liver disease Father        fatty liver disease; alcohol abuse   Other Father        gastritis   Healthy Brother    Alcohol abuse Daughter    COPD Daughter    Healthy Grandchild    Heart disease Maternal Grandfather    CAD Maternal Grandfather    Sudden death Brother        at birth   Unexplained death Daughter    Alcohol abuse Daughter    Healthy  Sister    Colon cancer Neg Hx    Colon polyps Neg Hx    Rectal cancer Neg Hx    Stomach cancer Neg Hx    Esophageal cancer Neg Hx     Review of Systems:  As stated in the HPI and otherwise negative.   LMP  (LMP Unknown)   Physical Examination: General: Well developed, well nourished, NAD  HEENT: OP clear, mucus membranes moist  SKIN: warm, dry. No rashes. Neuro: No focal deficits  Musculoskeletal: Muscle strength 5/5 all ext  Psychiatric: Mood and affect normal  Neck: No JVD, no carotid bruits, no thyromegaly, no lymphadenopathy.  Lungs:Clear bilaterally, no wheezes, rhonci, crackles Cardiovascular: Regular rate and rhythm. No murmurs, gallops or rubs. Abdomen:Soft. Bowel sounds present. Non-tender.  Extremities: No lower extremity edema. Pulses are 2 + in the bilateral DP/PT.  EKG:  EKG is not *** ordered today. The ekg ordered today demonstrates   Echo June 2023:  1. Left ventricular ejection fraction, by estimation, is 60 to 65%. The  left ventricle has normal function. The left ventricle has no regional  wall motion abnormalities. Left ventricular diastolic parameters are  consistent with Grade I diastolic  dysfunction (impaired relaxation).   2. Right ventricular systolic function is normal. The right ventricular  size is normal.   3. The mitral valve is degenerative. Trivial mitral valve regurgitation.  No evidence of mitral stenosis.   4. The aortic valve has an indeterminant number of cusps. Aortic valve  regurgitation is not visualized. Aortic valve sclerosis/calcification is  present, without any evidence of aortic stenosis.   5. The  inferior vena cava is normal in size with greater than 50%  respiratory variability, suggesting right atrial pressure of 3 mmHg.   Recent Labs: 05/17/2021: ALT 7; BUN 14; Creatinine 0.77; Hemoglobin 14.5; Platelet Count 384; Potassium 3.7; Sodium 140 07/29/2021: TSH 1.980   Lipid Panel    Component Value Date/Time   CHOL 174  04/14/2019 1026   TRIG 198.0 (H) 04/19/2020 1634   HDL 73 04/14/2019 1026   CHOLHDL 2.4 04/14/2019 1026   CHOLHDL 5.3 (H) 10/19/2015 0813   VLDL 44 (H) 10/19/2015 0813   LDLCALC 79 04/14/2019 1026   LDLDIRECT 124.0 08/17/2014 0909     Wt Readings from Last 3 Encounters:  10/12/21 145 lb 12.8 oz (66.1 kg)  07/21/21 148 lb 12.8 oz (67.5 kg)  05/17/21 147 lb 11.2 oz (67 kg)     Assessment and Plan:   1. CAD without angina: Coronary calcifications noted on CT in 2017. Negative stress test in 2017. No chest pain suggestive of angina. Continue ASA and beta blocker. She is statin intolerant. She is on Repatha  2. Hyperlipidemia: She is statin intolerant. She is on Repatha. LDL ***.   3. HTN: BP is well controlled. No changes.   4. SVT: No recent palpitations. Continue beta blocker  Labs/ tests ordered today include:  No orders of the defined types were placed in this encounter.  Disposition:   F/U with me in one year.   Signed, Lauree Chandler, MD 01/01/2022 12:00 PM    [address], [place], [place]  [address] Phone: [phone]; Fax: [phone]

## 2022-01-02 ENCOUNTER — Ambulatory Visit: Payer: Medicare Other | Attending: Cardiovascular Disease | Admitting: Cardiovascular Disease

## 2022-01-02 ENCOUNTER — Encounter: Payer: Self-pay | Admitting: Cardiovascular Disease

## 2022-01-02 VITALS — BP 130/70 | HR 80 | Ht 64.5 in | Wt 149.4 lb

## 2022-01-02 DIAGNOSIS — I2510 Atherosclerotic heart disease of native coronary artery without angina pectoris: Secondary | ICD-10-CM | POA: Diagnosis not present

## 2022-01-02 DIAGNOSIS — I471 Supraventricular tachycardia, unspecified: Secondary | ICD-10-CM

## 2022-01-02 DIAGNOSIS — E785 Hyperlipidemia, unspecified: Secondary | ICD-10-CM | POA: Diagnosis not present

## 2022-01-02 DIAGNOSIS — I10 Essential (primary) hypertension: Secondary | ICD-10-CM | POA: Diagnosis not present

## 2022-01-02 DIAGNOSIS — M810 Age-related osteoporosis without current pathological fracture: Secondary | ICD-10-CM | POA: Diagnosis not present

## 2022-01-02 NOTE — Patient Instructions (Signed)
Medication Instructions:  No changes *If you need a refill on your cardiac medications before your next appointment, please call your pharmacy*   Lab Work: none If you have labs (blood work) drawn today and your tests are completely normal, you will receive your results only by: MyChart Message (if you have MyChart) OR A paper copy in the mail If you have any lab test that is abnormal or we need to change your treatment, we will call you to review the results.   Testing/Procedures: none   Follow-Up: At [facility], you and your health needs are our priority.  As part of our continuing mission to provide you with exceptional heart care, we have created designated Provider Care Teams.  These Care Teams include your primary Cardiologist (physician) and Advanced Practice Providers (APPs -  Physician Assistants and Nurse Practitioners) who all work together to provide you with the care you need, when you need it.   Your next appointment:   12 month(s)  The format for your next appointment:   In Person  Provider:   Christopher McAlhany, MD     Important Information About Sugar       

## 2022-01-10 ENCOUNTER — Other Ambulatory Visit (HOSPITAL_COMMUNITY): Payer: Self-pay

## 2022-02-15 DIAGNOSIS — N760 Acute vaginitis: Secondary | ICD-10-CM | POA: Diagnosis not present

## 2022-02-17 DIAGNOSIS — E118 Type 2 diabetes mellitus with unspecified complications: Secondary | ICD-10-CM | POA: Diagnosis not present

## 2022-02-20 DIAGNOSIS — K219 Gastro-esophageal reflux disease without esophagitis: Secondary | ICD-10-CM | POA: Diagnosis not present

## 2022-02-20 DIAGNOSIS — N281 Cyst of kidney, acquired: Secondary | ICD-10-CM | POA: Diagnosis not present

## 2022-02-20 DIAGNOSIS — E118 Type 2 diabetes mellitus with unspecified complications: Secondary | ICD-10-CM | POA: Diagnosis not present

## 2022-02-20 DIAGNOSIS — J449 Chronic obstructive pulmonary disease, unspecified: Secondary | ICD-10-CM | POA: Diagnosis not present

## 2022-02-20 DIAGNOSIS — R918 Other nonspecific abnormal finding of lung field: Secondary | ICD-10-CM | POA: Diagnosis not present

## 2022-02-20 DIAGNOSIS — Z9081 Acquired absence of spleen: Secondary | ICD-10-CM | POA: Diagnosis not present

## 2022-02-20 DIAGNOSIS — I2510 Atherosclerotic heart disease of native coronary artery without angina pectoris: Secondary | ICD-10-CM | POA: Diagnosis not present

## 2022-02-20 DIAGNOSIS — D75839 Thrombocytosis, unspecified: Secondary | ICD-10-CM | POA: Diagnosis not present

## 2022-02-20 DIAGNOSIS — M810 Age-related osteoporosis without current pathological fracture: Secondary | ICD-10-CM | POA: Diagnosis not present

## 2022-02-20 DIAGNOSIS — I6523 Occlusion and stenosis of bilateral carotid arteries: Secondary | ICD-10-CM | POA: Diagnosis not present

## 2022-02-20 DIAGNOSIS — Z0000 Encounter for general adult medical examination without abnormal findings: Secondary | ICD-10-CM | POA: Diagnosis not present

## 2022-02-21 ENCOUNTER — Telehealth: Payer: Self-pay | Admitting: Internal Medicine

## 2022-02-21 NOTE — Telephone Encounter (Signed)
Pt states her protonix was not covered that her insurance company would only cover one time a year. [place] does she perhaps need a PA for this?

## 2022-02-21 NOTE — Telephone Encounter (Signed)
Inbound call from patient requesting a call back to discuss prescription for Protonix. Please advise.

## 2022-02-23 ENCOUNTER — Other Ambulatory Visit: Payer: Self-pay

## 2022-02-23 ENCOUNTER — Telehealth: Payer: Self-pay | Admitting: Internal Medicine

## 2022-02-23 NOTE — Telephone Encounter (Signed)
Noted  

## 2022-02-23 NOTE — Telephone Encounter (Signed)
Inbound call from patient stating she wanted to change her pharmacy information. Patient has switched to:  [facility] on [address] in [place]  Phone number: [phone]

## 2022-03-20 ENCOUNTER — Other Ambulatory Visit (HOSPITAL_COMMUNITY): Payer: Self-pay

## 2022-03-20 MED ORDER — PANTOPRAZOLE SODIUM 40 MG PO TBEC: 40.0000 mg | DELAYED_RELEASE_TABLET | Freq: Every day | ORAL | 0 refills | Status: DC

## 2022-03-20 NOTE — Telephone Encounter (Signed)
Received notification from [employer] regarding a prior authorization for  Pantoprazole . Authorization has been APPROVED from 02/18/22 to 03/20/23.   Authorization # Key: [id number] - PA Case ID: [id number]  Ran test claim, patient's copay for 90 day supply- $3.07

## 2022-03-20 NOTE — Telephone Encounter (Signed)
Rx for pantoprazole sent to [facility] [address] [employer] per patients request.

## 2022-03-22 ENCOUNTER — Other Ambulatory Visit (HOSPITAL_COMMUNITY): Payer: Self-pay

## 2022-03-22 DIAGNOSIS — R8271 Bacteriuria: Secondary | ICD-10-CM | POA: Diagnosis not present

## 2022-03-22 DIAGNOSIS — N281 Cyst of kidney, acquired: Secondary | ICD-10-CM | POA: Diagnosis not present

## 2022-03-24 ENCOUNTER — Other Ambulatory Visit (HOSPITAL_COMMUNITY): Payer: Self-pay

## 2022-04-01 ENCOUNTER — Other Ambulatory Visit (HOSPITAL_COMMUNITY): Payer: Self-pay

## 2022-04-02 ENCOUNTER — Other Ambulatory Visit (HOSPITAL_COMMUNITY): Payer: Self-pay

## 2022-04-02 MED ORDER — REPATHA SURECLICK 140 MG/ML ~~LOC~~ SOAJ
SUBCUTANEOUS | 3 refills | Status: DC
  Filled 2022-04-05: qty 6, 84d supply, fill #0
  Filled 2022-07-13: qty 6, 84d supply, fill #1
  Filled 2022-10-16: qty 6, 84d supply, fill #2
  Filled 2023-01-04: qty 6, 84d supply, fill #3

## 2022-04-03 ENCOUNTER — Other Ambulatory Visit (HOSPITAL_COMMUNITY): Payer: Self-pay

## 2022-04-03 DIAGNOSIS — R8271 Bacteriuria: Secondary | ICD-10-CM | POA: Diagnosis not present

## 2022-04-03 DIAGNOSIS — R351 Nocturia: Secondary | ICD-10-CM | POA: Diagnosis not present

## 2022-04-05 ENCOUNTER — Other Ambulatory Visit (HOSPITAL_COMMUNITY): Payer: Self-pay

## 2022-04-10 DIAGNOSIS — M1612 Unilateral primary osteoarthritis, left hip: Secondary | ICD-10-CM | POA: Diagnosis not present

## 2022-04-10 IMAGING — RF DG HIP (WITH PELVIS) OPERATIVE*L*
1 series · 2 of 2 positions shown · non-contrast
Comparison: None.

CLINICAL DATA: Left hip replacement.

EXAM:
OPERATIVE LEFT HIP (WITH PELVIS IF PERFORMED) to VIEWS
TECHNIQUE: Fluoroscopic spot image(s) were submitted for interpretation
post-operatively.

[Series 1: unknown protocol · 0.20mm/px · 2 of 2 slices shown]
[im 1/2]
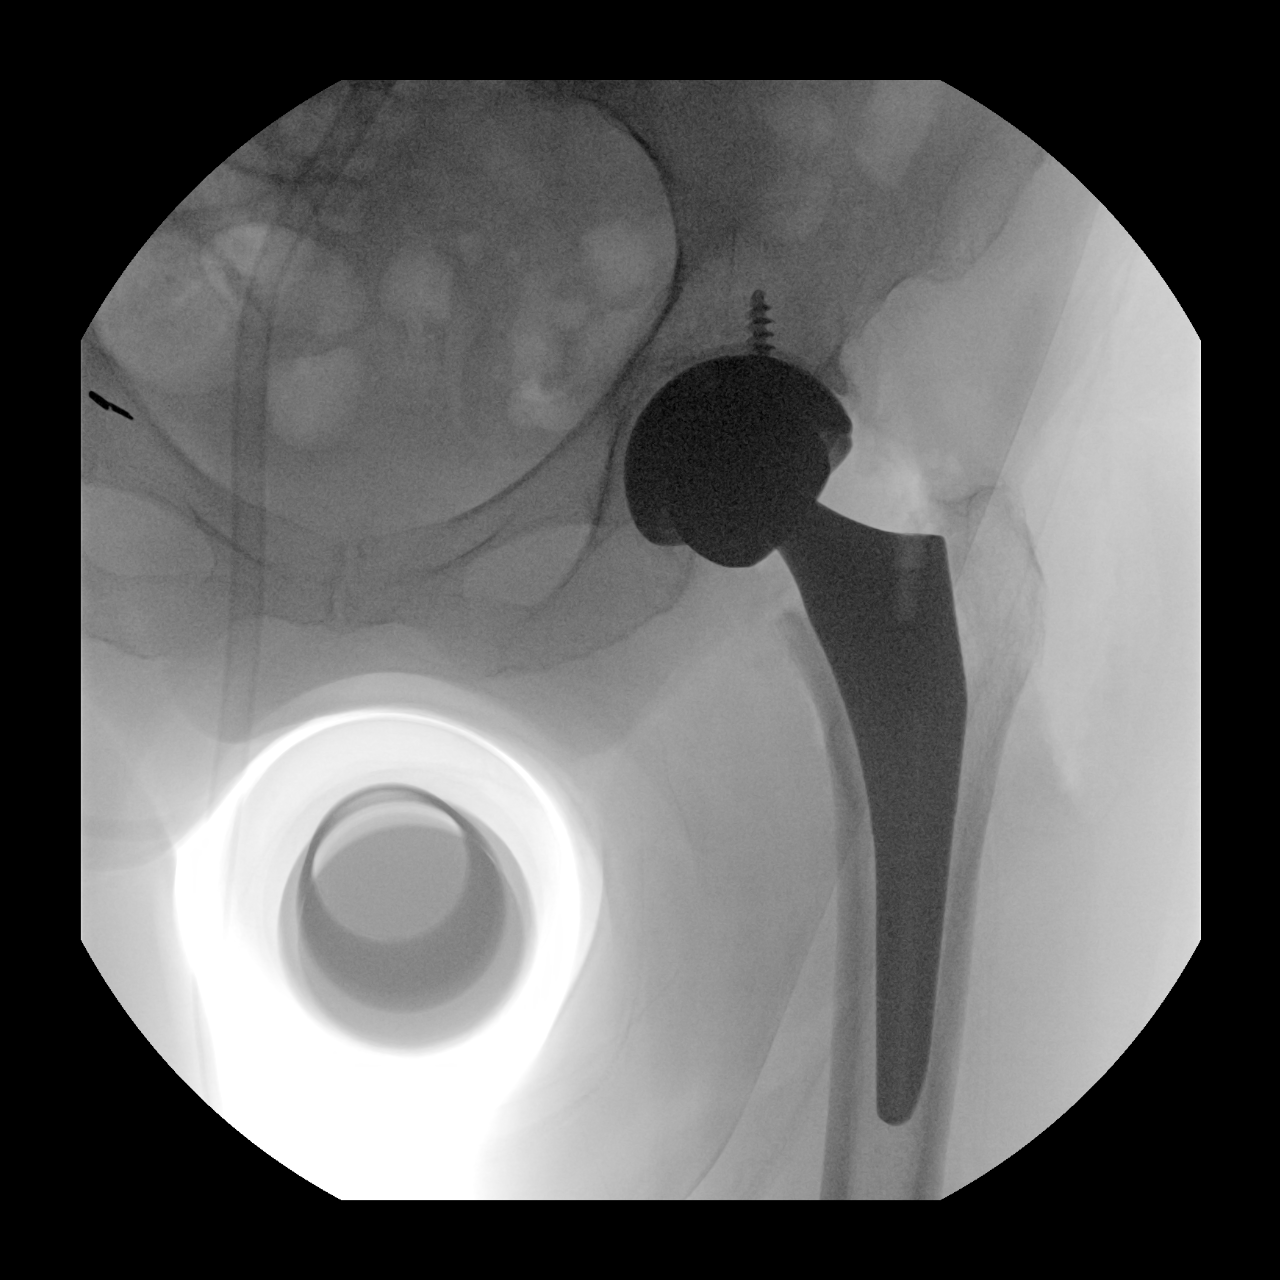
[im 2/2]
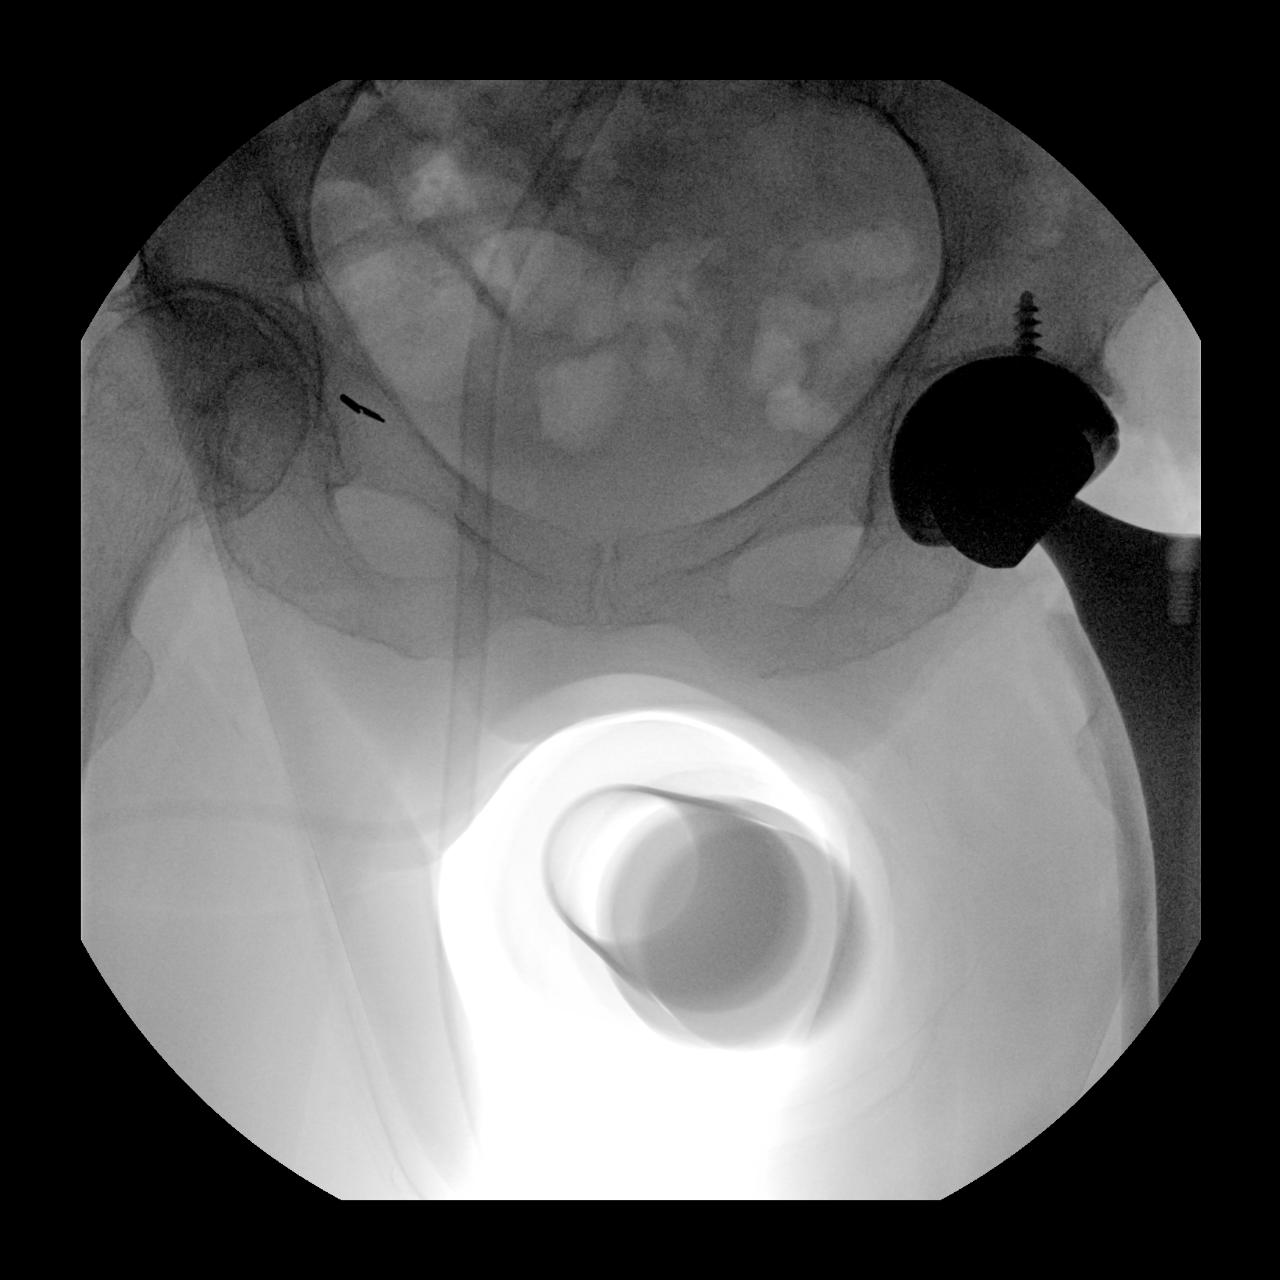

[2 of 2 positions shown; findings below may reference images not displayed]

FINDINGS: 2 spot fluoro images show the patient to be status post left total
hip replacement. No evidence for immediate hardware complication.
IMPRESSION: Intraoperative assessment during left total hip replacement.

## 2022-04-17 ENCOUNTER — Other Ambulatory Visit (HOSPITAL_COMMUNITY): Payer: Self-pay

## 2022-05-05 ENCOUNTER — Other Ambulatory Visit: Payer: Self-pay

## 2022-05-09 DIAGNOSIS — M791 Myalgia, unspecified site: Secondary | ICD-10-CM | POA: Diagnosis not present

## 2022-05-09 DIAGNOSIS — E1141 Type 2 diabetes mellitus with diabetic mononeuropathy: Secondary | ICD-10-CM | POA: Diagnosis not present

## 2022-05-09 DIAGNOSIS — Z90411 Acquired partial absence of pancreas: Secondary | ICD-10-CM | POA: Diagnosis not present

## 2022-05-09 DIAGNOSIS — E1165 Type 2 diabetes mellitus with hyperglycemia: Secondary | ICD-10-CM | POA: Diagnosis not present

## 2022-05-09 DIAGNOSIS — I10 Essential (primary) hypertension: Secondary | ICD-10-CM | POA: Diagnosis not present

## 2022-05-09 DIAGNOSIS — I2584 Coronary atherosclerosis due to calcified coronary lesion: Secondary | ICD-10-CM | POA: Diagnosis not present

## 2022-05-09 DIAGNOSIS — E7800 Pure hypercholesterolemia, unspecified: Secondary | ICD-10-CM | POA: Diagnosis not present

## 2022-05-09 DIAGNOSIS — Z9081 Acquired absence of spleen: Secondary | ICD-10-CM | POA: Diagnosis not present

## 2022-05-09 DIAGNOSIS — T466X5A Adverse effect of antihyperlipidemic and antiarteriosclerotic drugs, initial encounter: Secondary | ICD-10-CM | POA: Diagnosis not present

## 2022-06-07 DIAGNOSIS — H0288A Meibomian gland dysfunction right eye, upper and lower eyelids: Secondary | ICD-10-CM | POA: Diagnosis not present

## 2022-06-07 DIAGNOSIS — H353131 Nonexudative age-related macular degeneration, bilateral, early dry stage: Secondary | ICD-10-CM | POA: Diagnosis not present

## 2022-06-07 DIAGNOSIS — H0102A Squamous blepharitis right eye, upper and lower eyelids: Secondary | ICD-10-CM | POA: Diagnosis not present

## 2022-06-07 DIAGNOSIS — H524 Presbyopia: Secondary | ICD-10-CM | POA: Diagnosis not present

## 2022-06-07 DIAGNOSIS — H26493 Other secondary cataract, bilateral: Secondary | ICD-10-CM | POA: Diagnosis not present

## 2022-06-07 DIAGNOSIS — H0288B Meibomian gland dysfunction left eye, upper and lower eyelids: Secondary | ICD-10-CM | POA: Diagnosis not present

## 2022-06-07 DIAGNOSIS — Z961 Presence of intraocular lens: Secondary | ICD-10-CM | POA: Diagnosis not present

## 2022-06-07 DIAGNOSIS — E119 Type 2 diabetes mellitus without complications: Secondary | ICD-10-CM | POA: Diagnosis not present

## 2022-06-07 DIAGNOSIS — H0102B Squamous blepharitis left eye, upper and lower eyelids: Secondary | ICD-10-CM | POA: Diagnosis not present

## 2022-06-16 ENCOUNTER — Other Ambulatory Visit: Payer: Self-pay | Admitting: Internal Medicine

## 2022-06-21 DIAGNOSIS — H353131 Nonexudative age-related macular degeneration, bilateral, early dry stage: Secondary | ICD-10-CM | POA: Diagnosis not present

## 2022-06-21 DIAGNOSIS — H43813 Vitreous degeneration, bilateral: Secondary | ICD-10-CM | POA: Diagnosis not present

## 2022-06-28 ENCOUNTER — Other Ambulatory Visit (HOSPITAL_COMMUNITY): Payer: Self-pay

## 2022-06-28 DIAGNOSIS — E1165 Type 2 diabetes mellitus with hyperglycemia: Secondary | ICD-10-CM | POA: Diagnosis not present

## 2022-06-28 DIAGNOSIS — B3731 Acute candidiasis of vulva and vagina: Secondary | ICD-10-CM | POA: Diagnosis not present

## 2022-06-28 MED ORDER — METOPROLOL TARTRATE 50 MG PO TABS
50.0000 mg | ORAL_TABLET | Freq: Two times a day (BID) | ORAL | 3 refills | Status: AC
  Filled 2022-06-28: qty 180, 90d supply, fill #0

## 2022-06-29 ENCOUNTER — Other Ambulatory Visit (HOSPITAL_COMMUNITY): Payer: Self-pay

## 2022-07-03 DIAGNOSIS — N3020 Other chronic cystitis without hematuria: Secondary | ICD-10-CM | POA: Diagnosis not present

## 2022-07-05 DIAGNOSIS — M810 Age-related osteoporosis without current pathological fracture: Secondary | ICD-10-CM | POA: Diagnosis not present

## 2022-07-13 ENCOUNTER — Other Ambulatory Visit (HOSPITAL_COMMUNITY): Payer: Self-pay

## 2022-08-29 ENCOUNTER — Other Ambulatory Visit: Payer: Self-pay | Admitting: Surgery

## 2022-08-29 DIAGNOSIS — D490 Neoplasm of unspecified behavior of digestive system: Secondary | ICD-10-CM

## 2022-09-12 DIAGNOSIS — M25551 Pain in right hip: Secondary | ICD-10-CM | POA: Diagnosis not present

## 2022-09-16 DIAGNOSIS — M25551 Pain in right hip: Secondary | ICD-10-CM | POA: Diagnosis not present

## 2022-09-25 ENCOUNTER — Other Ambulatory Visit (HOSPITAL_COMMUNITY): Payer: Self-pay

## 2022-09-28 DIAGNOSIS — E1165 Type 2 diabetes mellitus with hyperglycemia: Secondary | ICD-10-CM | POA: Diagnosis not present

## 2022-09-28 DIAGNOSIS — I10 Essential (primary) hypertension: Secondary | ICD-10-CM | POA: Diagnosis not present

## 2022-09-28 DIAGNOSIS — E7800 Pure hypercholesterolemia, unspecified: Secondary | ICD-10-CM | POA: Diagnosis not present

## 2022-10-04 DIAGNOSIS — N281 Cyst of kidney, acquired: Secondary | ICD-10-CM | POA: Diagnosis not present

## 2022-10-04 DIAGNOSIS — R351 Nocturia: Secondary | ICD-10-CM | POA: Diagnosis not present

## 2022-10-04 DIAGNOSIS — N3020 Other chronic cystitis without hematuria: Secondary | ICD-10-CM | POA: Diagnosis not present

## 2022-10-05 DIAGNOSIS — Z23 Encounter for immunization: Secondary | ICD-10-CM | POA: Diagnosis not present

## 2022-10-05 DIAGNOSIS — Z9081 Acquired absence of spleen: Secondary | ICD-10-CM | POA: Diagnosis not present

## 2022-10-05 DIAGNOSIS — E875 Hyperkalemia: Secondary | ICD-10-CM | POA: Diagnosis not present

## 2022-10-05 DIAGNOSIS — E1141 Type 2 diabetes mellitus with diabetic mononeuropathy: Secondary | ICD-10-CM | POA: Diagnosis not present

## 2022-10-05 DIAGNOSIS — M791 Myalgia, unspecified site: Secondary | ICD-10-CM | POA: Diagnosis not present

## 2022-10-05 DIAGNOSIS — I10 Essential (primary) hypertension: Secondary | ICD-10-CM | POA: Diagnosis not present

## 2022-10-05 DIAGNOSIS — Z90411 Acquired partial absence of pancreas: Secondary | ICD-10-CM | POA: Diagnosis not present

## 2022-10-05 DIAGNOSIS — T466X5A Adverse effect of antihyperlipidemic and antiarteriosclerotic drugs, initial encounter: Secondary | ICD-10-CM | POA: Diagnosis not present

## 2022-10-05 DIAGNOSIS — I2584 Coronary atherosclerosis due to calcified coronary lesion: Secondary | ICD-10-CM | POA: Diagnosis not present

## 2022-10-05 DIAGNOSIS — F5101 Primary insomnia: Secondary | ICD-10-CM | POA: Diagnosis not present

## 2022-10-05 DIAGNOSIS — E7800 Pure hypercholesterolemia, unspecified: Secondary | ICD-10-CM | POA: Diagnosis not present

## 2022-10-13 ENCOUNTER — Ambulatory Visit
Admission: RE | Admit: 2022-10-13 | Discharge: 2022-10-13 | Disposition: A | Discharge status: Home / Self Care | Payer: Medicare Other | Source: Ambulatory Visit | Attending: Surgery

## 2022-10-13 DIAGNOSIS — Z90411 Acquired partial absence of pancreas: Secondary | ICD-10-CM | POA: Diagnosis not present

## 2022-10-13 DIAGNOSIS — Z9081 Acquired absence of spleen: Secondary | ICD-10-CM | POA: Diagnosis not present

## 2022-10-13 DIAGNOSIS — K5730 Diverticulosis of large intestine without perforation or abscess without bleeding: Secondary | ICD-10-CM | POA: Diagnosis not present

## 2022-10-13 DIAGNOSIS — D490 Neoplasm of unspecified behavior of digestive system: Secondary | ICD-10-CM

## 2022-10-13 MED ORDER — GADOPICLENOL 0.5 MMOL/ML IV SOLN
7.0000 mL | Freq: Once | INTRAVENOUS | Status: AC | PRN
  Administered 2022-10-13: 7 mL via INTRAVENOUS

## 2022-10-20 DIAGNOSIS — D490 Neoplasm of unspecified behavior of digestive system: Secondary | ICD-10-CM | POA: Diagnosis not present

## 2022-10-20 DIAGNOSIS — Z09 Encounter for follow-up examination after completed treatment for conditions other than malignant neoplasm: Secondary | ICD-10-CM | POA: Diagnosis not present

## 2022-10-31 DIAGNOSIS — L304 Erythema intertrigo: Secondary | ICD-10-CM | POA: Diagnosis not present

## 2022-11-07 DIAGNOSIS — I2584 Coronary atherosclerosis due to calcified coronary lesion: Secondary | ICD-10-CM | POA: Diagnosis not present

## 2022-11-07 DIAGNOSIS — I10 Essential (primary) hypertension: Secondary | ICD-10-CM | POA: Diagnosis not present

## 2023-01-01 DIAGNOSIS — E875 Hyperkalemia: Secondary | ICD-10-CM | POA: Diagnosis not present

## 2023-01-01 DIAGNOSIS — I10 Essential (primary) hypertension: Secondary | ICD-10-CM | POA: Diagnosis not present

## 2023-01-01 DIAGNOSIS — E785 Hyperlipidemia, unspecified: Secondary | ICD-10-CM | POA: Diagnosis not present

## 2023-01-01 DIAGNOSIS — R1032 Left lower quadrant pain: Secondary | ICD-10-CM | POA: Diagnosis not present

## 2023-01-01 DIAGNOSIS — Z87442 Personal history of urinary calculi: Secondary | ICD-10-CM | POA: Diagnosis not present

## 2023-01-01 DIAGNOSIS — E119 Type 2 diabetes mellitus without complications: Secondary | ICD-10-CM | POA: Diagnosis not present

## 2023-01-01 DIAGNOSIS — N182 Chronic kidney disease, stage 2 (mild): Secondary | ICD-10-CM | POA: Diagnosis not present

## 2023-01-01 LAB — LAB REPORT - SCANNED: EGFR: 89

## 2023-01-02 ENCOUNTER — Telehealth: Payer: Self-pay | Admitting: Internal Medicine

## 2023-01-02 NOTE — Telephone Encounter (Signed)
Inbound call from patient stating that she is having left sided abdominal pain. Patient was wanting to make an appointment with Dr. Rhea Belton, I advised her that he is currently booked and we would schedule with a PA. Patient stated she would rather see him. Requesting a call from the nurse to discuss symptoms. Please advise.

## 2023-01-03 NOTE — Telephone Encounter (Signed)
Pt states she has been having some abd pain and wants to see Dr. Terri Piedra. Pt scheduled to see Dr. Rhea Belton 02/12/23 at 2:30pm. Pt aware of appt. She states she may wind up going to urgent care if the pain gets worse, she did not want to schedule with an APP.

## 2023-01-05 ENCOUNTER — Other Ambulatory Visit (HOSPITAL_COMMUNITY): Payer: Self-pay

## 2023-01-17 ENCOUNTER — Other Ambulatory Visit: Payer: Self-pay

## 2023-02-02 NOTE — Telephone Encounter (Signed)
 Med Rec complete, allergies and Pharmacy verified February 02 2023.

## 2023-02-04 NOTE — Progress Notes (Deleted)
 No chief complaint on file.  History of Present Illness: 83 yo female with history of breast cancer, fibromyalgia, CAD (calcium  noted on CT), HTN, HLD, DVT, COPD, RBBB, SVT and carotid artery disease here today for follow up. She had been followed in our office by Dr. Maranda. I met her in December 2022. Nuclear stress test in 2017 with no ischemia. 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 She was seen in our office in June 2023 for evaluation of palpitations. Echo June 2023 with LVEF=60-65%. Aortic valve sclerosis with no stenosis. Cardiac monitor June 2023 with sinus, 5 runs of SVT-longest 15 seconds, PACs.   She is here today for follow up. The patient denies any chest pain, dyspnea, palpitations, lower extremity edema, orthopnea, PND, dizziness, near syncope or syncope.   Primary Care Physician: Clarice Nottingham, MD  Past Medical History:  Diagnosis Date   Anemia    history only at age 42 yrs olds, no current problems   Aortic atherosclerosis (HCC)    Arthritis    oa   Blood transfusion 1948   Blood transfusion without reported diagnosis    Breast cancer (HCC) 1994   bilateral / HX skin cancer   Carotid atherosclerosis    Cataract    both eyes   Constipation    COPD (chronic obstructive pulmonary disease) (HCC)    no meds/no inaler   Depression    Diabetes (HCC)    type 2 - diet controlled, no meds   Disc disease, degenerative, cervical    trouble turing head and neck at times   Diverticulosis    DVT (deep venous thrombosis) (HCC) 2002   left leg   Dyspnea    occasional with exertion   Dysrhythmia    Family history of adverse reaction to anesthesia    Fibromyalgia    GAD (generalized anxiety disorder)    GERD (gastroesophageal reflux disease)    History of kidney stones    History of vertigo    Hx of skin cancer, basal cell    Hyperlipidemia    Hypertension    Measles as child   Mumps  age 19   Osteoarthritis    Osteoporosis    Pancreatic cyst    Pancreatitis    Pneumonia    PONV (postoperative nausea and vomiting)    PONV   Pulmonary nodule    Right renal mass    Seborrheic keratosis    Sinus tachycardia    Sinus tachycardia    Tubular adenoma of colon     Past Surgical History:  Procedure Laterality Date   ABDOMINAL HYSTERECTOMY     achilles tendon adn 2 bone spurs removed Left 05/2012   APPENDECTOMY     BACK SURGERY  1976   lower   BASAL CELL CARCINOMA EXCISION     bilateral mastectomy with tramflap reconstruction  1994   BIOPSY  05/13/2020   Procedure: BIOPSY;  Surgeon: Wilhelmenia Aloha Raddle., MD;  Location: [facility] ENDOSCOPY;  Service: Gastroenterology;;   BREAST REDUCTION SURGERY     CARPAL TUNNEL RELEASE Right 03/2017   CATARACT EXTRACTION     COLONOSCOPY     ESOPHAGOGASTRODUODENOSCOPY (EGD) WITH PROPOFOL  N/A 05/13/2020   Procedure: ESOPHAGOGASTRODUODENOSCOPY (EGD) WITH PROPOFOL ;  Surgeon: Wilhelmenia Aloha Raddle., MD;  Location: [facility] ENDOSCOPY;  Service: Gastroenterology;  Laterality: N/A;   EUS N/A 05/13/2020   Procedure: UPPER ENDOSCOPIC ULTRASOUND (EUS) RADIAL;  Surgeon: Wilhelmenia Aloha Raddle.,  MD;  Location: [facility] ENDOSCOPY;  Service: Gastroenterology;  Laterality: N/A;   FOOT SURGERY     KNEE ARTHROSCOPY Right 2008   KNEE SURGERY  2002   arthroscopy-bilateral   LAPAROSCOPIC LIVER ULTRASOUND N/A 08/30/2020   Procedure: LAPAROSCOPIC INTRAOPERATIVE ULTRASOUND;  Surgeon: Dasie Leonor CROME, MD;  Location: [facility] OR;  Service: General;  Laterality: N/A;   LAPAROSCOPIC SPLENECTOMY N/A 08/30/2020   Procedure: LAPAROSCOPIC SPLENECTOMY;  Surgeon: Dasie Leonor CROME, MD;  Location: [facility] OR;  Service: General;  Laterality: N/A;   MASTECTOMY     bilateral   PANCREATECTOMY N/A 08/30/2020   Procedure: LAPAROSCOPIC DISTAL PANCREATECTOMY;  Surgeon: Dasie Leonor CROME, MD;  Location: [facility] OR;  Service: General;  Laterality: N/A;   PARTIAL HYSTERECTOMY     POLYPECTOMY      SHOULDER SURGERY Left    TONSILLECTOMY     TOTAL HIP ARTHROPLASTY Left 03/15/2021   Procedure: TOTAL HIP ARTHROPLASTY ANTERIOR APPROACH;  Surgeon: Beverley Evalene BIRCH, MD;  Location: [facility] ORS;  Service: Orthopedics;  Laterality: Left;   TOTAL KNEE ARTHROPLASTY Left 10/14/2012   Procedure: LEFT TOTAL KNEE ARTHROPLASTY;  Surgeon: Dempsey LULLA Moan, MD;  Location: [facility] ORS;  Service: Orthopedics;  Laterality: Left;   TOTAL KNEE ARTHROPLASTY Right 10/20/2013   Procedure: RIGHT TOTAL KNEE ARTHROPLASTY;  Surgeon: Dempsey Moan LULLA, MD;  Location: [facility] ORS;  Service: Orthopedics;  Laterality: Right;    Current Outpatient Medications  Medication Sig Dispense Refill   aspirin  EC 81 MG tablet Take 1 tablet (81 mg total) by mouth daily. Swallow whole. 90 tablet 3   Calcium  Carbonate-Vit D-Min (CALCIUM  1200 PO) Take 1,200 mg by mouth daily.     cholecalciferol (VITAMIN D3) 25 MCG (1000 UNIT) tablet Take 1,000 Units by mouth daily.     denosumab  (PROLIA ) 60 MG/ML SOSY injection Inject 60 mg into the skin every 6 (six) months.      Evolocumab  (REPATHA  SURECLICK) 140 MG/ML SOAJ Inject 1 ml under the skin every 14 days as directed. 6 mL 3   metFORMIN  (GLUCOPHAGE -XR) 500 MG 24 hr tablet Take 1 tablet by mouth twice daily with food. 180 tablet 3   metoprolol  tartrate (LOPRESSOR ) 50 MG tablet Take1 tablet (50 mg total) by mouth twice a day. 180 tablet 3   Multiple Vitamin (MULTIVITAMIN) tablet Take 1 tablet by mouth daily.     pantoprazole  (PROTONIX ) 40 MG tablet Take 1 tablet (40 mg total) by mouth daily. NEEDS OFFICE VISIT FOR FURTHER REFILLS 90 tablet 0   No current facility-administered medications for this visit.    Allergies  Allergen Reactions   Hydrocodone  Nausea And Vomiting   Ibuprofen Hives    Blisters    Alendronate Sodium     Other reaction(s): worse reflux   Atorvastatin  Other (See Comments)    Pt reports causes muscle aches.   Buspirone Hcl     Other reaction(s): headache   Codeine Itching     Mood changes, can take percocet   Ezetimibe      Other reaction(s): myalgias   Lisinopril Cough   Temazepam     Other reaction(s): daytime drowsiness   Tramadol  Hcl     Other reaction(s): dizzy   Trazodone Hcl     Other reaction(s): nightmares   Valdecoxib     Other reaction(s): stomach pain   Aspirin  Nausea Only    Patient able to take lower dose, Aspirin  81mg      Social History   Socioeconomic History   Marital status: Married  Spouse name: Not on file   Number of children: 2   Years of education: Not on file   Highest education level: Not on file  Occupational History   Occupation: RETIRED    Employer: RETIRED  Tobacco Use   Smoking status: Former    Current packs/day: 0.00    Average packs/day: 1 pack/day for 20.0 years (20.0 ttl pk-yrs)    Types: Cigarettes    Start date: 02/11/1972    Quit date: 02/11/1992    Years since quitting: 31.0   Smokeless tobacco: Never  Vaping Use   Vaping status: Never Used  Substance and Sexual Activity   Alcohol use: Not Currently    Comment: occasional wine-rare   Drug use: No   Sexual activity: Not Currently    Partners: Male    Birth control/protection: Surgical, Post-menopausal    Comment: hYSTERECTOMY  Other Topics Concern   Not on file  Social History Narrative   Not on file   Social Drivers of Health   Financial Resource Strain: Not on file  Food Insecurity: Not on file  Transportation Needs: Not on file  Physical Activity: Not on file  Stress: Not on file  Social Connections: Unknown (06/14/2021)   Received from [facility], [facility]   Social Network    Social Network: Not on file  Intimate Partner Violence: Unknown (05/06/2021)   Received from [employer], [facility]   HITS    Physically Hurt: Not on file    Insult or Talk Down To: Not on file    Threaten Physical Harm: Not on file    Scream or Curse: Not on file    Family History  Problem Relation Age of Onset   Alzheimer's disease Mother     Alcohol abuse Father    Sudden death Father    Liver disease Father        fatty liver disease; alcohol abuse   Other Father        gastritis   Healthy Brother    Alcohol abuse Daughter    COPD Daughter    Healthy Grandchild    Heart disease Maternal Grandfather    CAD Maternal Grandfather    Sudden death Brother        at birth   Unexplained death Daughter    Alcohol abuse Daughter    Healthy Sister    Colon cancer Neg Hx    Colon polyps Neg Hx    Rectal cancer Neg Hx    Stomach cancer Neg Hx    Esophageal cancer Neg Hx     Review of Systems:  As stated in the HPI and otherwise negative.   LMP  (LMP Unknown)   Physical Examination: General: Well developed, well nourished, NAD  HEENT: OP clear, mucus membranes moist  SKIN: warm, dry. No rashes. Neuro: No focal deficits  Musculoskeletal: Muscle strength 5/5 all ext  Psychiatric: Mood and affect normal  Neck: No JVD, no carotid bruits, no thyromegaly, no lymphadenopathy.  Lungs:Clear bilaterally, no wheezes, rhonci, crackles Cardiovascular: Regular rate and rhythm. No murmurs, gallops or rubs. Abdomen:Soft. Bowel sounds present. Non-tender.  Extremities: No lower extremity edema. Pulses are 2 + in the bilateral DP/PT.  EKG:  EKG is not *** ordered today. The ekg ordered today demonstrates   Echo June 2023:  1. Left ventricular ejection fraction, by estimation, is 60 to 65%. The  left ventricle has normal function. The left ventricle has no regional  wall motion abnormalities. Left ventricular  diastolic parameters are  consistent with Grade I diastolic  dysfunction (impaired relaxation).   2. Right ventricular systolic function is normal. The right ventricular  size is normal.   3. The mitral valve is degenerative. Trivial mitral valve regurgitation.  No evidence of mitral stenosis.   4. The aortic valve has an indeterminant number of cusps. Aortic valve  regurgitation is not visualized. Aortic valve  sclerosis/calcification is  present, without any evidence of aortic stenosis.   5. The inferior vena cava is normal in size with greater than 50%  respiratory variability, suggesting right atrial pressure of 3 mmHg.   Recent Labs: No results found for requested labs within last 365 days.   Lipid Panel    Component Value Date/Time   CHOL 174 04/14/2019 1026   TRIG 198.0 (H) 04/19/2020 1634   HDL 73 04/14/2019 1026   CHOLHDL 2.4 04/14/2019 1026   CHOLHDL 5.3 (H) 10/19/2015 0813   VLDL 44 (H) 10/19/2015 0813   LDLCALC 79 04/14/2019 1026   LDLDIRECT 124.0 08/17/2014 0909     Wt Readings from Last 3 Encounters:  01/02/22 67.8 kg  10/12/21 66.1 kg  07/21/21 67.5 kg    Assessment and Plan:   1. CAD without angina: Coronary calcifications noted on CT in 2017. Negative stress test in 2017. No chest pain. Continue ASA and beta blocker. She is statin intolerant. She is on Repatha .   2. Hyperlipidemia: She is statin intolerant. She is on Repatha . Her lipids are followed in primary care. LDL ***. Continue Repatha   3. HTN: BP is controlled. No changes today  4. SVT: No palpitations. Continue beta blocker.   Labs/ tests ordered today include:  No orders of the defined types were placed in this encounter.  Disposition:   F/U with me in one year.   Signed, Lonni Cash, MD 02/04/2023 6:13 PM    [facility] [address] [place], [place], [place]  [address] Phone: [phone]; Fax: [phone]

## 2023-02-05 ENCOUNTER — Telehealth: Payer: Self-pay | Admitting: *Deleted

## 2023-02-05 ENCOUNTER — Ambulatory Visit: Payer: Medicare Other | Admitting: Cardiovascular Disease

## 2023-02-05 NOTE — Telephone Encounter (Signed)
 S/w pt is aware appt has been canceled due to inclement weather. Scheduler will call pt to R/S.

## 2023-02-12 ENCOUNTER — Encounter: Payer: Self-pay | Admitting: Internal Medicine

## 2023-02-12 ENCOUNTER — Ambulatory Visit: Payer: Medicare Other | Admitting: Internal Medicine

## 2023-02-12 VITALS — BP 116/52 | HR 88 | Ht 64.0 in | Wt 145.0 lb

## 2023-02-12 DIAGNOSIS — Z860100 Personal history of colon polyps, unspecified: Secondary | ICD-10-CM | POA: Diagnosis not present

## 2023-02-12 DIAGNOSIS — K2100 Gastro-esophageal reflux disease with esophagitis, without bleeding: Secondary | ICD-10-CM

## 2023-02-12 DIAGNOSIS — D490 Neoplasm of unspecified behavior of digestive system: Secondary | ICD-10-CM

## 2023-02-12 DIAGNOSIS — K828 Other specified diseases of gallbladder: Secondary | ICD-10-CM

## 2023-02-12 DIAGNOSIS — K5790 Diverticulosis of intestine, part unspecified, without perforation or abscess without bleeding: Secondary | ICD-10-CM | POA: Diagnosis not present

## 2023-02-12 DIAGNOSIS — R1032 Left lower quadrant pain: Secondary | ICD-10-CM

## 2023-02-12 MED ORDER — PANTOPRAZOLE SODIUM 40 MG PO TBEC: 40.0000 mg | DELAYED_RELEASE_TABLET | Freq: Every day | ORAL | 3 refills | Status: AC

## 2023-02-12 NOTE — Progress Notes (Signed)
 Chief Complaint  Patient presents with   Follow-up    SVT   History of Present Illness: 83 yo female with history of breast cancer, fibromyalgia, CAD (calcium  noted on CT), HTN, HLD, DVT, COPD, RBBB, SVT and carotid artery disease here today for follow up. She had been followed in our office by Dr. Maranda. I met her in December 2022. Nuclear stress test in 2017 with no ischemia. 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 She was seen in our office in June 2023 for evaluation of palpitations. Echo June 2023 with LVEF=60-65%. Aortic valve sclerosis with no stenosis. Cardiac monitor June 2023 with sinus, 5 runs of SVT-longest 15 seconds, PACs.   She is here today for follow up. The patient denies any chest pain, dyspnea, palpitations, lower extremity edema, orthopnea, PND, dizziness, near syncope or syncope.   Primary Care Physician: Clarice Nottingham, MD  Past Medical History:  Diagnosis Date   Anemia    history only at age 30 yrs olds, no current problems   Aortic atherosclerosis (HCC)    Arthritis    oa   Blood transfusion 1948   Blood transfusion without reported diagnosis    Breast cancer (HCC) 1994   bilateral / HX skin cancer   Carotid atherosclerosis    Cataract    both eyes   Constipation    COPD (chronic obstructive pulmonary disease) (HCC)    no meds/no inaler   Depression    Diabetes (HCC)    type 2 - diet controlled, no meds   Disc disease, degenerative, cervical    trouble turing head and neck at times   Diverticulosis    DVT (deep venous thrombosis) (HCC) 2002   left leg   Dyspnea    occasional with exertion   Dysrhythmia    Family history of adverse reaction to anesthesia    Fibromyalgia    GAD (generalized anxiety disorder)    GERD (gastroesophageal reflux disease)    History of kidney stones    History of vertigo    Hx of skin cancer, basal cell    Hyperlipidemia     Hypertension    Macular degeneration of both eyes    Measles as child   Mumps age 73   Osteoarthritis    Osteoporosis    Pancreatic cyst    Pancreatitis    Pneumonia    PONV (postoperative nausea and vomiting)    PONV   Pulmonary nodule    Right renal mass    Seborrheic keratosis    Sinus tachycardia    Sinus tachycardia    Tubular adenoma of colon     Past Surgical History:  Procedure Laterality Date   ABDOMINAL HYSTERECTOMY     achilles tendon adn 2 bone spurs removed Left 05/2012   APPENDECTOMY     BACK SURGERY  1976   lower   BASAL CELL CARCINOMA EXCISION     bilateral mastectomy with tramflap reconstruction  1994   BIOPSY  05/13/2020   Procedure: BIOPSY;  Surgeon: Wilhelmenia Aloha Raddle., MD;  Location: [facility] ENDOSCOPY;  Service: Gastroenterology;;   BREAST REDUCTION SURGERY     CARPAL TUNNEL RELEASE Right 03/2017   CATARACT EXTRACTION     COLONOSCOPY     ESOPHAGOGASTRODUODENOSCOPY (EGD) WITH PROPOFOL  N/A 05/13/2020   Procedure: ESOPHAGOGASTRODUODENOSCOPY (EGD) WITH PROPOFOL ;  Surgeon: Wilhelmenia Aloha Raddle., MD;  Location: [facility] ENDOSCOPY;  Service: Gastroenterology;  Laterality: N/A;  EUS N/A 05/13/2020   Procedure: UPPER ENDOSCOPIC ULTRASOUND (EUS) RADIAL;  Surgeon: Wilhelmenia Aloha Raddle., MD;  Location: [facility] ENDOSCOPY;  Service: Gastroenterology;  Laterality: N/A;   FOOT SURGERY     KNEE ARTHROSCOPY Right 2008   KNEE SURGERY  2002   arthroscopy-bilateral   LAPAROSCOPIC LIVER ULTRASOUND N/A 08/30/2020   Procedure: LAPAROSCOPIC INTRAOPERATIVE ULTRASOUND;  Surgeon: Dasie Leonor CROME, MD;  Location: [facility] OR;  Service: General;  Laterality: N/A;   LAPAROSCOPIC SPLENECTOMY N/A 08/30/2020   Procedure: LAPAROSCOPIC SPLENECTOMY;  Surgeon: Dasie Leonor CROME, MD;  Location: [facility] OR;  Service: General;  Laterality: N/A;   MASTECTOMY     bilateral   PANCREATECTOMY N/A 08/30/2020   Procedure: LAPAROSCOPIC DISTAL PANCREATECTOMY;  Surgeon: Dasie Leonor CROME, MD;  Location: [facility] OR;  Service:  General;  Laterality: N/A;   PARTIAL HYSTERECTOMY     POLYPECTOMY     SHOULDER SURGERY Left    TONSILLECTOMY     TOTAL HIP ARTHROPLASTY Left 03/15/2021   Procedure: TOTAL HIP ARTHROPLASTY ANTERIOR APPROACH;  Surgeon: Beverley Evalene BIRCH, MD;  Location: [facility] ORS;  Service: Orthopedics;  Laterality: Left;   TOTAL KNEE ARTHROPLASTY Left 10/14/2012   Procedure: LEFT TOTAL KNEE ARTHROPLASTY;  Surgeon: Dempsey LULLA Moan, MD;  Location: [facility] ORS;  Service: Orthopedics;  Laterality: Left;   TOTAL KNEE ARTHROPLASTY Right 10/20/2013   Procedure: RIGHT TOTAL KNEE ARTHROPLASTY;  Surgeon: Dempsey Moan LULLA, MD;  Location: [facility] ORS;  Service: Orthopedics;  Laterality: Right;    Current Outpatient Medications  Medication Sig Dispense Refill   aspirin  EC 81 MG tablet Take 1 tablet (81 mg total) by mouth daily. Swallow whole. 90 tablet 3   Calcium  Carbonate-Vit D-Min (CALCIUM  1200 PO) Take 1,200 mg by mouth daily.     cholecalciferol (VITAMIN D3) 25 MCG (1000 UNIT) tablet Take 1,000 Units by mouth daily.     denosumab  (PROLIA ) 60 MG/ML SOSY injection Inject 60 mg into the skin every 6 (six) months.     Evolocumab  (REPATHA  SURECLICK) 140 MG/ML SOAJ Inject 1 ml under the skin every 14 days as directed. 6 mL 3   metFORMIN  (GLUCOPHAGE -XR) 500 MG 24 hr tablet Take 1 tablet by mouth twice daily with food. (Patient taking differently: Take 1,000 mg by mouth daily.) 180 tablet 3   metoprolol  tartrate (LOPRESSOR ) 50 MG tablet Take1 tablet (50 mg total) by mouth twice a day. 180 tablet 3   Multiple Vitamin (MULTIVITAMIN) tablet Take 1 tablet by mouth daily.     Multiple Vitamins-Minerals (PRESERVISION AREDS 2) CAPS Take 1 capsule by mouth 2 (two) times daily.     pantoprazole  (PROTONIX ) 40 MG tablet Take 1 tablet (40 mg total) by mouth daily. 90 tablet 3   No current facility-administered medications for this visit.    Allergies  Allergen Reactions   Hydrocodone  Nausea And Vomiting   Ibuprofen Hives    Blisters     Alendronate Sodium     Other reaction(s): worse reflux   Atorvastatin  Other (See Comments)    Pt reports causes muscle aches.   Buspirone Hcl     Other reaction(s): headache   Codeine Itching    Mood changes, can take percocet   Ezetimibe      Other reaction(s): myalgias   Lisinopril Cough   Temazepam     Other reaction(s): daytime drowsiness   Tramadol  Hcl     Other reaction(s): dizzy   Trazodone Hcl     Other reaction(s): nightmares   Valdecoxib     Other  reaction(s): stomach pain   Aspirin  Nausea Only    Patient able to take lower dose, Aspirin  81mg      Social History   Socioeconomic History   Marital status: Married    Spouse name: Not on file   Number of children: 2   Years of education: Not on file   Highest education level: Not on file  Occupational History   Occupation: RETIRED    Employer: RETIRED  Tobacco Use   Smoking status: Former    Current packs/day: 0.00    Average packs/day: 1 pack/day for 20.0 years (20.0 ttl pk-yrs)    Types: Cigarettes    Start date: 02/11/1972    Quit date: 02/11/1992    Years since quitting: 31.0   Smokeless tobacco: Never  Vaping Use   Vaping status: Never Used  Substance and Sexual Activity   Alcohol use: Not Currently    Comment: occasional wine-rare   Drug use: No   Sexual activity: Not Currently    Partners: Male    Birth control/protection: Surgical, Post-menopausal    Comment: hYSTERECTOMY  Other Topics Concern   Not on file  Social History Narrative   Not on file   Social Drivers of Health   Financial Resource Strain: Not on file  Food Insecurity: Not on file  Transportation Needs: Not on file  Physical Activity: Not on file  Stress: Not on file  Social Connections: Unknown (06/14/2021)   Received from [facility], [facility]   Social Network    Social Network: Not on file  Intimate Partner Violence: Unknown (05/06/2021)   Received from [employer], [facility]   HITS    Physically Hurt: Not on  file    Insult or Talk Down To: Not on file    Threaten Physical Harm: Not on file    Scream or Curse: Not on file    Family History  Problem Relation Age of Onset   Alzheimer's disease Mother    Alcohol abuse Father    Sudden death Father    Liver disease Father        fatty liver disease; alcohol abuse   Other Father        gastritis   Healthy Brother    Alcohol abuse Daughter    COPD Daughter    Healthy Grandchild    Heart disease Maternal Grandfather    CAD Maternal Grandfather    Sudden death Brother        at birth   Unexplained death Daughter    Alcohol abuse Daughter    Healthy Sister    Colon cancer Neg Hx    Colon polyps Neg Hx    Rectal cancer Neg Hx    Stomach cancer Neg Hx    Esophageal cancer Neg Hx     Review of Systems:  As stated in the HPI and otherwise negative.   BP 132/70   Pulse 84   Ht 5' 4 (1.626 m)   Wt 66.6 kg   LMP  (LMP Unknown)   SpO2 97%   BMI 25.20 kg/m   Physical Examination: General: Well developed, well nourished, NAD  HEENT: OP clear, mucus membranes moist  SKIN: warm, dry. No rashes. Neuro: No focal deficits  Musculoskeletal: Muscle strength 5/5 all ext  Psychiatric: Mood and affect normal  Neck: No JVD, no carotid bruits, no thyromegaly, no lymphadenopathy.  Lungs:Clear bilaterally, no wheezes, rhonci, crackles Cardiovascular: Regular rate and rhythm. No murmurs, gallops or rubs. Abdomen:Soft. Bowel sounds  present. Non-tender.  Extremities: No lower extremity edema. Pulses are 2 + in the bilateral DP/PT.  EKG:  EKG is ordered today. The ekg ordered today demonstrates  EKG Interpretation Date/Time:  Tuesday February 13 2023 10:29:46 EST Ventricular Rate:  78 PR Interval:  150 QRS Duration:  94 QT Interval:  408 QTC Calculation: 465 R Axis:   61  Text Interpretation: Normal sinus rhythm Incomplete right bundle branch block Confirmed by Verlin Bruckner [phone]) on 02/13/2023 10:33:12 AM   Echo June 2023:  1.  Left ventricular ejection fraction, by estimation, is 60 to 65%. The  left ventricle has normal function. The left ventricle has no regional  wall motion abnormalities. Left ventricular diastolic parameters are  consistent with Grade I diastolic  dysfunction (impaired relaxation).   2. Right ventricular systolic function is normal. The right ventricular  size is normal.   3. The mitral valve is degenerative. Trivial mitral valve regurgitation.  No evidence of mitral stenosis.   4. The aortic valve has an indeterminant number of cusps. Aortic valve  regurgitation is not visualized. Aortic valve sclerosis/calcification is  present, without any evidence of aortic stenosis.   5. The inferior vena cava is normal in size with greater than 50%  respiratory variability, suggesting right atrial pressure of 3 mmHg.   Recent Labs: No results found for requested labs within last 365 days.   Lipid Panel    Component Value Date/Time   CHOL 174 04/14/2019 1026   TRIG 198.0 (H) 04/19/2020 1634   HDL 73 04/14/2019 1026   CHOLHDL 2.4 04/14/2019 1026   CHOLHDL 5.3 (H) 10/19/2015 0813   VLDL 44 (H) 10/19/2015 0813   LDLCALC 79 04/14/2019 1026   LDLDIRECT 124.0 08/17/2014 0909     Wt Readings from Last 3 Encounters:  02/13/23 66.6 kg  02/12/23 65.8 kg  01/02/22 67.8 kg     Assessment and Plan:   1. CAD without angina: Coronary calcifications noted on CT in 2017. Negative stress test in 2017. She does not have chest pain that is suggestive of angina. She is statin intolerant. She is on Repatha . Continue ASA, beta blocker and Repatha .   2. Hyperlipidemia: She is statin intolerant. She is on Repatha . Her lipids are followed in primary care. She tells me that she has lab work to be done at her primary care visit this month.   3. HTN: BP is well controlled. Continue current therapy  4. SVT: No palpitations. Continue beta blocker.   Labs/ tests ordered today include:   Orders Placed This  Encounter  Procedures   EKG 12-Lead   Disposition:   F/U with me in one year.   Signed, Bruckner Verlin, MD 02/13/2023 10:57 AM    [facility] [address] [place], [place], [place]  [address] Phone: [phone]; Fax: [phone]

## 2023-02-12 NOTE — Patient Instructions (Addendum)
 We have sent the following medications to your pharmacy for you to pick up at your convenience: pantoprazole .   Continue Miralax  daily.   Follow up with Dr. Albertus in one year.  _______________________________________________________  If your blood pressure at your visit was 140/90 or greater, please contact your primary care physician to follow up on this.  _______________________________________________________  If you are age 83 or older, your body mass index should be between 23-30. Your Body mass index is 24.89 kg/m. If this is out of the aforementioned range listed, please consider follow up with your Primary Care Provider.  If you are age 24 or younger, your body mass index should be between 19-25. Your Body mass index is 24.89 kg/m. If this is out of the aformentioned range listed, please consider follow up with your Primary Care Provider.   ________________________________________________________  The [facility] providers would like to encourage you to use MYCHART to communicate with providers for non-urgent requests or questions.  Due to long hold times on the telephone, sending your provider a message by [facility] may be a faster and more efficient way to get a response.  Please allow 48 business hours for a response.  Please remember that this is for non-urgent requests.  _______________________________________________________

## 2023-02-12 NOTE — Progress Notes (Signed)
 Subjective:    Patient ID: Renee [name], female    DOB: 04/28/1940, 83 y.o.   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  She is here alone today and was last seen on 10/12/2021.  She reports that about 1 month ago she developed initially upper but then distinctly left lower quadrant abdominal pain.  The pain was initially located on the right side and then migrated to the left lower side. The pain was intense enough to cause significant discomfort but was not associated with fever. Despite the pain, the patient did not report any constipation. The patient sought advice from a kidney specialist due to the pain, and lab work revealed an elevated white blood cell count. However, the kidney-related parameters were within normal limits.  In an attempt to alleviate the pain, the patient resumed taking Miralax , which led to the resolution of the pain. The patient denied any bloody stools and reported regular bowel movements. The patient also reported no current pain and has been eating well.  No nausea or vomiting.  The patient also has a history of breast surgery and has been opened up twice across the abdomen, which has resulted in scar tissue. The patient reported some soreness in the area of the scar tissue.   The patient is currently on pantoprazole  once a day in the morning for reflux control and requested a refill of the medication.  No breakthrough heartburn, dysphagia or odynophagia.  Review of Systems As per HPI, otherwise negative  Current Medications, Allergies, Past Medical History, Past Surgical History, Family History and Social History were reviewed  in [employer] record.    Objective:   Physical Exam BP (!) 116/52   Pulse 88   Ht 5' 4 (1.626 m)   Wt 145 lb (65.8 kg)   LMP  (LMP Unknown)   SpO2 97%   BMI 24.89 kg/m  Gen: awake, alert, NAD HEENT: anicteric  CV: RRR, no mrg Pulm: CTA b/l Abd: soft, NT/ND, +BS throughout Ext: no c/c/e Neuro: nonfocal  MRI ABDOMEN WITHOUT AND WITH CONTRAST (INCLUDING MRCP)   TECHNIQUE: Multiplanar multisequence MR imaging of the abdomen was performed both before and after the administration of intravenous contrast. Heavily T2-weighted images of the biliary and pancreatic ducts were obtained, and three-dimensional MRCP images were rendered by post processing.   CONTRAST:  7 mL of Vueway .   COMPARISON:  Abdominal MRI 08/12/2021.   FINDINGS: Lower chest: Unremarkable.   Hepatobiliary: 3 mm T1 hypointense, T2 hyperintense, nonenhancing lesion in segment 2 of the liver, compatible with a tiny cyst or biliary hamartoma (benign requires no imaging follow-up). No other suspicious appearing hepatic lesions are noted. No intra or extrahepatic biliary ductal dilatation noted on MRCP images. Common bile duct measures 5 mm in the porta hepatis. No filling defect in the common bile duct to suggest choledocholithiasis. Gallbladder is moderately distended. Gallbladder wall thickness is normal. No pericholecystic fluid or surrounding inflammatory changes. There is some amorphous T1 hyperintense, T2 hypointense material lying dependently in the gallbladder, compatible with biliary sludge.   Pancreas: Status post distal pancreatectomy. No unexpected soft tissue mass adjacent to the resection margin. No pancreatic ductal dilatation. No pancreatic or peripancreatic fluid collections  or inflammatory changes.   Spleen:  Status post splenectomy.   Adrenals/Urinary Tract: 1.1 cm T1 hypointense, T2 hyperintense exophytic lesion extending off the superolateral aspect of the  upper pole of the left kidney, compatible with a simple (Bosniak class 1, no imaging follow-up recommended) cyst. Right kidney and bilateral adrenal glands are otherwise normal in appearance. No hydroureteronephrosis in the visualized portions of the abdomen.   Stomach/Bowel: Numerous colonic diverticula are noted. Otherwise, unremarkable.   Vascular/Lymphatic: Aortic atherosclerosis. No aneurysm identified in the visualized abdominal vasculature. No lymphadenopathy noted in the abdomen.   Other: No significant volume of ascites noted in the visualized portions of the peritoneal cavity.   Musculoskeletal: No aggressive appearing osseous lesions are noted in the visualized portions of the skeleton.   IMPRESSION: 1. Stable postoperative changes of distal pancreatectomy and splenectomy with no findings to suggest recurrent disease in the abdomen. 2. Colonic diverticulosis. 3. Incidental findings, as above.     Electronically Signed   By: Toribio Aye M.D.   On: 10/20/2022 10:46     Assessment & Plan:  Diverticulosis and possible recent mild diverticulitis resolving without antibiotics Recent episode of left lower quadrant pain, possibly due to mild diverticulitis. Pain resolved without antibiotics and with Miralax  use. No fever or bloody stools reported. -Continue Miralax  to help maintain regular bowel movements. -Cross-sectional imaging in September and previous colonoscopy -known to have sigmoid and descending diverticulosis. -Report if pain returns.  Post-Pancreatic Surgery/IPMN with dysplasia Removed in 2022 Survey once MRI in September showed successful removal of precancerous cyst. No signs of recurrence. -No further action required at this time. -Dr. Dasie plans annual surveillance x 5 years  Gastroesophageal Reflux Disease (GERD) Controlled with Pantoprazole . -Refill Pantoprazole  prescription at 40 mg daily  Gallbladder Sludge Noted on imaging. No current  symptoms. -Report if right upper quadrant pain or vomiting occurs.  History of colon polyps Previous discussion and colonoscopy discontinued based on age at patient preference  Follow-up Annual check-ups recommended. -Schedule next appointment in 1 year.  30 minutes total spent today including patient facing time, coordination of care, reviewing medical history/procedures/pertinent radiology studies, and documentation of the encounter.

## 2023-02-13 ENCOUNTER — Ambulatory Visit: Payer: Medicare Other | Attending: Cardiovascular Disease | Admitting: Cardiovascular Disease

## 2023-02-13 ENCOUNTER — Encounter: Payer: Self-pay | Admitting: Cardiovascular Disease

## 2023-02-13 VITALS — BP 132/70 | HR 84 | Ht 64.0 in | Wt 146.8 lb

## 2023-02-13 DIAGNOSIS — I2510 Atherosclerotic heart disease of native coronary artery without angina pectoris: Secondary | ICD-10-CM | POA: Diagnosis not present

## 2023-02-13 DIAGNOSIS — E785 Hyperlipidemia, unspecified: Secondary | ICD-10-CM | POA: Diagnosis not present

## 2023-02-13 DIAGNOSIS — I471 Supraventricular tachycardia, unspecified: Secondary | ICD-10-CM

## 2023-02-13 DIAGNOSIS — I10 Essential (primary) hypertension: Secondary | ICD-10-CM | POA: Diagnosis not present

## 2023-02-13 NOTE — Patient Instructions (Signed)
 Medication Instructions:  No changes *If you need a refill on your cardiac medications before your next appointment, please call your pharmacy*   Lab Work: none If you have labs (blood work) drawn today and your tests are completely normal, you will receive your results only by: MyChart Message (if you have MyChart) OR A paper copy in the mail If you have any lab test that is abnormal or we need to change your treatment, we will call you to review the results.   Testing/Procedures: none   Follow-Up: At [facility], you and your health needs are our priority.  As part of our continuing mission to provide you with exceptional heart care, we have created designated Provider Care Teams.  These Care Teams include your primary Cardiologist (physician) and Advanced Practice Providers (APPs -  Physician Assistants and Nurse Practitioners) who all work together to provide you with the care you need, when you need it.   Your next appointment:   12 month(s)  Provider:   Verne Carrow, MD    Other Instructions   1st Floor: - Lobby - Registration  - Pharmacy  - Lab - Cafe  2nd Floor: - PV Lab - Diagnostic Testing (echo, CT, nuclear med)  3rd Floor: - Vacant  4th Floor: - TCTS (cardiothoracic surgery) - AFib Clinic - Structural Heart Clinic - Vascular Surgery  - Vascular Ultrasound  5th Floor: - [facility] (general and EP) - Clinical Pharmacy for coumadin, hypertension, lipid, weight-loss medications, and med management appointments    Valet parking services will be available as well.

## 2023-02-20 DIAGNOSIS — E7800 Pure hypercholesterolemia, unspecified: Secondary | ICD-10-CM | POA: Diagnosis not present

## 2023-02-20 DIAGNOSIS — I10 Essential (primary) hypertension: Secondary | ICD-10-CM | POA: Diagnosis not present

## 2023-02-20 DIAGNOSIS — E118 Type 2 diabetes mellitus with unspecified complications: Secondary | ICD-10-CM | POA: Diagnosis not present

## 2023-02-23 DIAGNOSIS — I700 Atherosclerosis of aorta: Secondary | ICD-10-CM | POA: Diagnosis not present

## 2023-02-23 DIAGNOSIS — I2510 Atherosclerotic heart disease of native coronary artery without angina pectoris: Secondary | ICD-10-CM | POA: Diagnosis not present

## 2023-02-23 DIAGNOSIS — I10 Essential (primary) hypertension: Secondary | ICD-10-CM | POA: Diagnosis not present

## 2023-02-23 DIAGNOSIS — Z0000 Encounter for general adult medical examination without abnormal findings: Secondary | ICD-10-CM | POA: Diagnosis not present

## 2023-02-23 DIAGNOSIS — I6529 Occlusion and stenosis of unspecified carotid artery: Secondary | ICD-10-CM | POA: Diagnosis not present

## 2023-02-23 DIAGNOSIS — R918 Other nonspecific abnormal finding of lung field: Secondary | ICD-10-CM | POA: Diagnosis not present

## 2023-02-23 DIAGNOSIS — J449 Chronic obstructive pulmonary disease, unspecified: Secondary | ICD-10-CM | POA: Diagnosis not present

## 2023-02-23 DIAGNOSIS — K219 Gastro-esophageal reflux disease without esophagitis: Secondary | ICD-10-CM | POA: Diagnosis not present

## 2023-02-23 DIAGNOSIS — E118 Type 2 diabetes mellitus with unspecified complications: Secondary | ICD-10-CM | POA: Diagnosis not present

## 2023-02-23 DIAGNOSIS — M810 Age-related osteoporosis without current pathological fracture: Secondary | ICD-10-CM | POA: Diagnosis not present

## 2023-02-23 DIAGNOSIS — Z79899 Other long term (current) drug therapy: Secondary | ICD-10-CM | POA: Diagnosis not present

## 2023-02-28 DIAGNOSIS — M810 Age-related osteoporosis without current pathological fracture: Secondary | ICD-10-CM | POA: Diagnosis not present

## 2023-03-14 ENCOUNTER — Other Ambulatory Visit (HOSPITAL_COMMUNITY): Payer: Self-pay

## 2023-03-15 ENCOUNTER — Other Ambulatory Visit (HOSPITAL_COMMUNITY): Payer: Self-pay

## 2023-03-15 MED ORDER — REPATHA SURECLICK 140 MG/ML ~~LOC~~ SOAJ
SUBCUTANEOUS | 3 refills | Status: DC
  Filled 2023-03-15: qty 6, 84d supply, fill #0
  Filled 2023-06-13: qty 6, 84d supply, fill #1
  Filled 2023-09-09: qty 6, 84d supply, fill #2
  Filled 2023-11-30: qty 6, 84d supply, fill #3

## 2023-03-28 DIAGNOSIS — N189 Chronic kidney disease, unspecified: Secondary | ICD-10-CM | POA: Diagnosis not present

## 2023-04-04 DIAGNOSIS — E875 Hyperkalemia: Secondary | ICD-10-CM | POA: Diagnosis not present

## 2023-04-04 DIAGNOSIS — I10 Essential (primary) hypertension: Secondary | ICD-10-CM | POA: Diagnosis not present

## 2023-04-04 DIAGNOSIS — Z87442 Personal history of urinary calculi: Secondary | ICD-10-CM | POA: Diagnosis not present

## 2023-04-04 DIAGNOSIS — E119 Type 2 diabetes mellitus without complications: Secondary | ICD-10-CM | POA: Diagnosis not present

## 2023-05-08 DIAGNOSIS — H6691 Otitis media, unspecified, right ear: Secondary | ICD-10-CM | POA: Diagnosis not present

## 2023-05-24 DIAGNOSIS — E1169 Type 2 diabetes mellitus with other specified complication: Secondary | ICD-10-CM | POA: Diagnosis not present

## 2023-05-24 DIAGNOSIS — M810 Age-related osteoporosis without current pathological fracture: Secondary | ICD-10-CM | POA: Diagnosis not present

## 2023-05-24 DIAGNOSIS — I10 Essential (primary) hypertension: Secondary | ICD-10-CM | POA: Diagnosis not present

## 2023-05-24 DIAGNOSIS — E1141 Type 2 diabetes mellitus with diabetic mononeuropathy: Secondary | ICD-10-CM | POA: Diagnosis not present

## 2023-05-28 DIAGNOSIS — Z6825 Body mass index (BMI) 25.0-25.9, adult: Secondary | ICD-10-CM | POA: Diagnosis not present

## 2023-05-28 DIAGNOSIS — N760 Acute vaginitis: Secondary | ICD-10-CM | POA: Diagnosis not present

## 2023-05-28 DIAGNOSIS — Z01419 Encounter for gynecological examination (general) (routine) without abnormal findings: Secondary | ICD-10-CM | POA: Diagnosis not present

## 2023-05-28 DIAGNOSIS — L292 Pruritus vulvae: Secondary | ICD-10-CM | POA: Diagnosis not present

## 2023-05-28 DIAGNOSIS — R300 Dysuria: Secondary | ICD-10-CM | POA: Diagnosis not present

## 2023-06-05 ENCOUNTER — Encounter: Payer: Self-pay | Admitting: Emergency Medicine

## 2023-06-05 ENCOUNTER — Ambulatory Visit
Admission: EM | Admit: 2023-06-05 | Discharge: 2023-06-05 | Disposition: A | Discharge status: Home / Self Care | Attending: Nurse Practitioner | Admitting: Nurse Practitioner

## 2023-06-05 DIAGNOSIS — T162XXA Foreign body in left ear, initial encounter: Secondary | ICD-10-CM

## 2023-06-05 NOTE — Discharge Instructions (Signed)
Patient declined AVS 

## 2023-06-05 NOTE — ED Triage Notes (Signed)
 States she has been putting mineral oil in ears.  States she used a paper towel to hold oil in and now she has paper stuck in both ears.

## 2023-06-05 NOTE — ED Provider Notes (Signed)
 [facility]    CSN: [id number] Arrival date & time: 06/05/23  1658      History   Chief Complaint No chief complaint on file.   HPI Renee [name] is a 83 y.o. female.   The history is provided by the patient.   Patient presents for complaints of a foreign object in both the ears.  Patient states she used mineral oil in her ear which was recommended by her doctor.  She states that she used paper towel subsequently thereafter to keep the oil in her ears.  Patient states that she believes she has a paper towel stuck in her ears.  Denies fever, chills, drainage from the ears, or decreased hearing.  Past Medical History:  Diagnosis Date   Anemia    history only at age 71 yrs olds, no current problems   Aortic atherosclerosis (HCC)    Arthritis    oa   Blood transfusion 1948   Blood transfusion without reported diagnosis    Breast cancer (HCC) 1994   bilateral / HX skin cancer   Carotid atherosclerosis    Cataract    both eyes   Constipation    COPD (chronic obstructive pulmonary disease) (HCC)    no meds/no inaler   Depression    Diabetes (HCC)    type 2 - diet controlled, no meds   Disc disease, degenerative, cervical    trouble turing head and neck at times   Diverticulosis    DVT (deep venous thrombosis) (HCC) 2002   left leg   Dyspnea    occasional with exertion   Dysrhythmia    Family history of adverse reaction to anesthesia    Fibromyalgia    GAD (generalized anxiety disorder)    GERD (gastroesophageal reflux disease)    History of kidney stones    History of vertigo    Hx of skin cancer, basal cell    Hyperlipidemia    Hypertension    Macular degeneration of both eyes    Measles as child   Mumps age 70   Osteoarthritis    Osteoporosis    Pancreatic cyst    Pancreatitis    Pneumonia    PONV (postoperative nausea and vomiting)    PONV   Pulmonary nodule    Right renal mass    Seborrheic keratosis    Sinus tachycardia    Sinus  tachycardia    Tubular adenoma of colon     Patient Active Problem List   Diagnosis Date Noted   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/2021   Nausea without vomiting 11/09/2020   Periumbilical abdominal pain 11/09/2020   [place] grade A esophagitis 11/09/2020   Failure to thrive in adult 10/13/2020   Emesis, persistent    Intra-abdominal infection    Malnutrition of moderate degree 09/27/2020   Sepsis (HCC) 09/25/2020   UTI (urinary tract infection) 09/25/2020   Thrombocytosis 09/25/2020   Leukocytosis 09/25/2020   Hypoalbuminemia 09/25/2020   Hyponatremia 09/25/2020   Hyperglycemia 09/25/2020   Lactic acidosis 09/25/2020   Prolonged QT interval 09/25/2020   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   Primary localized osteoarthritis of left hip 08/09/2020   RBBB 07/05/2020   Dizziness 07/05/2020   Lumbar pain 10/08/2017   Cervical spine pain 02/21/2017   Carotid stenosis 07/23/2015   Multiple pulmonary nodules 02/11/2015   Essential hypertension 07/08/2013   Sinus tachycardia 01/08/2013   Difficulty walking 11/04/2012   Pain of  left hip joint 11/04/2012   GERD (gastroesophageal reflux disease) 10/15/2012   Hyperlipidemia 10/15/2012   Dyspnea 02/09/2012    Past Surgical History:  Procedure Laterality Date   ABDOMINAL HYSTERECTOMY     achilles tendon adn 2 bone spurs removed Left 05/2012   APPENDECTOMY     BACK SURGERY  1976   lower   BASAL CELL CARCINOMA EXCISION     bilateral mastectomy with tramflap reconstruction  1994   BIOPSY  05/13/2020   Procedure: BIOPSY;  Surgeon: Normie Becton., MD;  Location: [facility] ENDOSCOPY;  Service: Gastroenterology;;   BREAST REDUCTION SURGERY     CARPAL TUNNEL RELEASE Right 03/2017   CATARACT EXTRACTION     COLONOSCOPY     ESOPHAGOGASTRODUODENOSCOPY (EGD) WITH PROPOFOL  N/A 05/13/2020   Procedure: ESOPHAGOGASTRODUODENOSCOPY (EGD) WITH PROPOFOL ;  Surgeon: Normie Becton., MD;  Location: [facility] ENDOSCOPY;  Service: Gastroenterology;  Laterality: N/A;   EUS N/A 05/13/2020   Procedure: UPPER ENDOSCOPIC ULTRASOUND (EUS) RADIAL;  Surgeon: Normie Becton., MD;  Location: [facility] ENDOSCOPY;  Service: Gastroenterology;  Laterality: N/A;   FOOT SURGERY     KNEE ARTHROSCOPY Right 2008   KNEE SURGERY  2002   arthroscopy-bilateral   LAPAROSCOPIC LIVER ULTRASOUND N/A 08/30/2020   Procedure: LAPAROSCOPIC INTRAOPERATIVE ULTRASOUND;  Surgeon: Lujean Sake, MD;  Location: [facility] OR;  Service: General;  Laterality: N/A;   LAPAROSCOPIC SPLENECTOMY N/A 08/30/2020   Procedure: LAPAROSCOPIC SPLENECTOMY;  Surgeon: Lujean Sake, MD;  Location: [facility] OR;  Service: General;  Laterality: N/A;   MASTECTOMY     bilateral   PANCREATECTOMY N/A 08/30/2020   Procedure: LAPAROSCOPIC DISTAL PANCREATECTOMY;  Surgeon: Lujean Sake, MD;  Location: [facility] OR;  Service: General;  Laterality: N/A;   PARTIAL HYSTERECTOMY     POLYPECTOMY     SHOULDER SURGERY Left    TONSILLECTOMY     TOTAL HIP ARTHROPLASTY Left 03/15/2021   Procedure: TOTAL HIP ARTHROPLASTY ANTERIOR APPROACH;  Surgeon: Saundra Curl, MD;  Location: [facility] ORS;  Service: Orthopedics;  Laterality: Left;   TOTAL KNEE ARTHROPLASTY Left 10/14/2012   Procedure: LEFT TOTAL KNEE ARTHROPLASTY;  Surgeon: Aurther Blue, MD;  Location: [facility] ORS;  Service: Orthopedics;  Laterality: Left;   TOTAL KNEE ARTHROPLASTY Right 10/20/2013   Procedure: RIGHT TOTAL KNEE ARTHROPLASTY;  Surgeon: Aurther Blue, MD;  Location: [facility] ORS;  Service: Orthopedics;  Laterality: Right;    OB History   No obstetric history on file.      Home Medications    Prior to Admission medications   Medication Sig Start Date End Date Taking? Authorizing Provider  aspirin  EC 81 MG tablet Take 1 tablet (81 mg total) by mouth daily. Swallow whole. 07/21/21   Swinyer, Leilani Punter, NP  Calcium  Carbonate-Vit D-Min (CALCIUM  1200 PO) Take 1,200 mg by mouth daily.     [provider]  cholecalciferol (VITAMIN D3) 25 MCG (1000 UNIT) tablet Take 1,000 Units by mouth daily.    [provider]  denosumab  (PROLIA ) 60 MG/ML SOSY injection Inject 60 mg into the skin every 6 (six) months. 06/10/18   [provider]  Evolocumab  (REPATHA  SURECLICK) 140 MG/ML SOAJ Inject 1 ml under the skin every 14 days as directed. 03/14/23     metFORMIN  (GLUCOPHAGE -XR) 500 MG 24 hr tablet Take 1 tablet by mouth twice daily with food. Patient taking differently: Take 1,000 mg by mouth daily. 03/31/21     metoprolol  tartrate (LOPRESSOR ) 50 MG tablet Take1 tablet (50 mg total) by  mouth twice a day. 06/28/22     Multiple Vitamin (MULTIVITAMIN) tablet Take 1 tablet by mouth daily.    [provider]  Multiple Vitamins-Minerals (PRESERVISION AREDS 2) CAPS Take 1 capsule by mouth 2 (two) times daily.    [provider]  pantoprazole  (PROTONIX ) 40 MG tablet Take 1 tablet (40 mg total) by mouth daily. 02/12/23   Pyrtle, Amber Bail, MD    Family History Family History  Problem Relation Age of Onset   Alzheimer's disease Mother    Alcohol abuse Father    Sudden death Father    Liver disease Father        fatty liver disease; alcohol abuse   Other Father        gastritis   Healthy Brother    Alcohol abuse Daughter    COPD Daughter    Healthy Grandchild    Heart disease Maternal Grandfather    CAD Maternal Grandfather    Sudden death Brother        at birth   Unexplained death Daughter    Alcohol abuse Daughter    Healthy Sister    Colon cancer Neg Hx    Colon polyps Neg Hx    Rectal cancer Neg Hx    Stomach cancer Neg Hx    Esophageal cancer Neg Hx     Social History Social History   Tobacco Use   Smoking status: Former    Current packs/day: 0.00    Average packs/day: 1 pack/day for 20.0 years (20.0 ttl pk-yrs)    Types: Cigarettes    Start date: 02/11/1972    Quit date: 02/11/1992    Years since quitting: 31.3   Smokeless tobacco:  Never  Vaping Use   Vaping status: Never Used  Substance Use Topics   Alcohol use: Not Currently    Comment: occasional wine-rare   Drug use: No     Allergies   Hydrocodone , Ibuprofen, Alendronate sodium, Atorvastatin , Buspirone hcl, Codeine, Ezetimibe , Lisinopril, Temazepam, Tramadol  hcl, Trazodone hcl, Valdecoxib, and Aspirin    Review of Systems Review of Systems Per HPI  Physical Exam Triage Vital Signs ED Triage Vitals  Encounter Vitals Group     BP 06/05/23 1703 (!) 150/74     Systolic BP Percentile --      Diastolic BP Percentile --      Pulse Rate 06/05/23 1703 87     Resp 06/05/23 1703 20     Temp 06/05/23 1703 98.2 F (36.8 C)     Temp Source 06/05/23 1703 Oral     SpO2 06/05/23 1703 94 %     Weight --      Height --      Head Circumference --      Peak Flow --      Pain Score 06/05/23 1705 0     Pain Loc --      Pain Education --      Exclude from Growth Chart --    No data found.  Updated Vital Signs BP (!) 150/74 (BP Location: Right Arm)   Pulse 87   Temp 98.2 F (36.8 C) (Oral)   Resp 20   LMP  (LMP Unknown)   SpO2 94%   Visual Acuity Right Eye Distance:   Left Eye Distance:   Bilateral Distance:    Right Eye Near:   Left Eye Near:    Bilateral Near:     Physical Exam Vitals and nursing note reviewed.  Constitutional:  General: She is not in acute distress.    Appearance: Normal appearance.  HENT:     Head: Normocephalic.     Right Ear: Hearing, tympanic membrane, ear canal and external ear normal.     Left Ear: External ear normal. A foreign body is present.     Ears:     Comments: Cloudy object noted in the canal of the left ear.  Right ear canal is clear, able to visualize tympanic membrane.  Ear irrigation performed of the left ear.  Able to remove small amount of paper towel appearing object from the ear.  Patient reports improvement of her symptoms. Eyes:     Extraocular Movements: Extraocular movements intact.      Pupils: Pupils are equal, round, and reactive to light.  Pulmonary:     Effort: Pulmonary effort is normal.  Musculoskeletal:     Cervical back: Normal range of motion.  Skin:    General: Skin is warm and dry.  Neurological:     General: No focal deficit present.     Mental Status: She is alert and oriented to person, place, and time.  Psychiatric:        Mood and Affect: Mood normal.        Behavior: Behavior normal.      UC Treatments / Results  Labs (all labs ordered are listed, but only abnormal results are displayed) Labs Reviewed - No data to display  EKG   Radiology No results found.  Procedures Foreign Body Removal  Date/Time: 06/05/2023 5:20 PM  Performed by: Hardy Lia, NP Authorized by: Hardy Lia, NP   Consent:    Consent obtained:  Verbal   Consent given by:  Patient   Risks discussed:  Incomplete removal   Alternatives discussed:  No treatment Universal protocol:    Patient identity confirmed:  Verbally with patient Location:    Location:  Ear   Ear location:  L ear Anesthesia:    Anesthesia method:  None Procedure details:    Localization method:  Visualized   Removal mechanism:  Irrigation Post-procedure details:    Neurovascular status: intact     Procedure completion:  Tolerated Comments:     Irrigation of the left ear was performed with complete removal of "paper towel" appearing object from the left ear canal.  (including critical care time)  Medications Ordered in UC Medications - No data to display  Initial Impression / Assessment and Plan / UC Course  I have reviewed the triage vital signs and the nursing notes.  Pertinent labs & imaging results that were available during my care of the patient were reviewed by me and considered in my medical decision making (see chart for details).  Irrigation of the left ear was performed for foreign object removal.  Able to remove foreign object from the left ear,  consistent with a paper towel.  Patient advised to refrain from placing any foreign objects inside of the ear in the future.  Patient was in agreement with this plan of care and verbalized understanding.  All questions were answered.  Patient stable for discharge.   Final Clinical Impressions(s) / UC Diagnoses   Final diagnoses:  None   Discharge Instructions   None    ED Prescriptions   None    PDMP not reviewed this encounter.   Hardy Lia, NP 06/05/23 1722

## 2023-06-13 ENCOUNTER — Other Ambulatory Visit: Payer: Self-pay

## 2023-06-13 ENCOUNTER — Other Ambulatory Visit (HOSPITAL_COMMUNITY): Payer: Self-pay

## 2023-06-14 ENCOUNTER — Other Ambulatory Visit (HOSPITAL_COMMUNITY): Payer: Self-pay

## 2023-06-14 DIAGNOSIS — H0288A Meibomian gland dysfunction right eye, upper and lower eyelids: Secondary | ICD-10-CM | POA: Diagnosis not present

## 2023-06-14 DIAGNOSIS — H353131 Nonexudative age-related macular degeneration, bilateral, early dry stage: Secondary | ICD-10-CM | POA: Diagnosis not present

## 2023-06-14 DIAGNOSIS — E119 Type 2 diabetes mellitus without complications: Secondary | ICD-10-CM | POA: Diagnosis not present

## 2023-06-14 DIAGNOSIS — H26493 Other secondary cataract, bilateral: Secondary | ICD-10-CM | POA: Diagnosis not present

## 2023-06-14 DIAGNOSIS — H0288B Meibomian gland dysfunction left eye, upper and lower eyelids: Secondary | ICD-10-CM | POA: Diagnosis not present

## 2023-06-20 DIAGNOSIS — H353131 Nonexudative age-related macular degeneration, bilateral, early dry stage: Secondary | ICD-10-CM | POA: Diagnosis not present

## 2023-06-20 DIAGNOSIS — H43813 Vitreous degeneration, bilateral: Secondary | ICD-10-CM | POA: Diagnosis not present

## 2023-07-31 DIAGNOSIS — M25551 Pain in right hip: Secondary | ICD-10-CM | POA: Diagnosis not present

## 2023-08-08 DIAGNOSIS — M25551 Pain in right hip: Secondary | ICD-10-CM | POA: Diagnosis not present

## 2023-08-29 DIAGNOSIS — M810 Age-related osteoporosis without current pathological fracture: Secondary | ICD-10-CM | POA: Diagnosis not present

## 2023-09-11 ENCOUNTER — Other Ambulatory Visit: Payer: Self-pay | Admitting: Surgery

## 2023-09-11 DIAGNOSIS — D490 Neoplasm of unspecified behavior of digestive system: Secondary | ICD-10-CM

## 2023-10-02 ENCOUNTER — Encounter: Payer: Self-pay | Admitting: Surgery

## 2023-10-06 ENCOUNTER — Ambulatory Visit
Admission: RE | Admit: 2023-10-06 | Discharge: 2023-10-06 | Disposition: A | Discharge status: Home / Self Care | Source: Ambulatory Visit | Attending: Surgery

## 2023-10-06 DIAGNOSIS — D490 Neoplasm of unspecified behavior of digestive system: Secondary | ICD-10-CM

## 2023-10-06 DIAGNOSIS — K5730 Diverticulosis of large intestine without perforation or abscess without bleeding: Secondary | ICD-10-CM | POA: Diagnosis not present

## 2023-10-06 MED ORDER — GADOPICLENOL 0.5 MMOL/ML IV SOLN
7.0000 mL | Freq: Once | INTRAVENOUS | Status: AC | PRN
  Administered 2023-10-06: 7 mL via INTRAVENOUS

## 2023-10-15 ENCOUNTER — Other Ambulatory Visit (HOSPITAL_COMMUNITY): Payer: Self-pay

## 2023-10-15 ENCOUNTER — Other Ambulatory Visit: Payer: Self-pay | Admitting: Surgery

## 2023-10-15 DIAGNOSIS — Q8901 Asplenia (congenital): Secondary | ICD-10-CM | POA: Diagnosis not present

## 2023-10-15 DIAGNOSIS — Z9081 Acquired absence of spleen: Secondary | ICD-10-CM

## 2023-10-15 DIAGNOSIS — D490 Neoplasm of unspecified behavior of digestive system: Secondary | ICD-10-CM | POA: Diagnosis not present

## 2023-10-15 MED ORDER — MENINGOCOCCAL VAC B (OMV) IM SUSY
0.5000 mL | PREFILLED_SYRINGE | Freq: Once | INTRAMUSCULAR | 0 refills | Status: AC
  Filled 2023-10-15 – 2023-11-22 (×6): qty 0.5, 1d supply, fill #0

## 2023-10-15 NOTE — Progress Notes (Signed)
 PROVIDER:  SHELBY LYNN ALLEN, MD  MRN: [id number] DOB: [birth year] DATE OF ENCOUNTER: 10/15/2023 Subjective     Chief Complaint: Follow-up ( pancreatectomy lap splenectomy 1yt LTF - MRI 10/06/23/)     History of Present Illness: Renee [name] is a 83 y.o. female who is seen today for follow up. 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 She recently had a surveillance MRI, which does not show any abnormalities in the pancreas. Today she is doing well. She reports occasional sharp abdominal pains that are brief. She denies issues with her incisions.     Review of Systems: A complete review of systems was obtained from the patient.  I have reviewed this information and discussed as appropriate with the patient.  See HPI as well for other ROS.    Medical History: Past Medical History:  Diagnosis Date  . Arthritis   . COPD (chronic obstructive pulmonary disease) (CMS/HHS-HCC)   . DVT (deep venous thrombosis) (CMS/HHS-HCC)   . GERD (gastroesophageal reflux disease)   . History of cancer   . Hyperlipidemia     Patient Active Problem List  Diagnosis  . Pancreatic cyst (HHS-HCC)  . Adhesive capsulitis of left shoulder  . Body mass index (BMI) 28.0-28.9, adult  . Carotid stenosis  . Carpal tunnel syndrome of left wrist  . Carpal tunnel syndrome of right wrist  . Cervical spondylosis without myelopathy  . Cervical spine pain  . Degeneration of cervical intervertebral disc  . Difficulty walking  . Dizziness  . Elevated blood-pressure reading, without diagnosis of hypertension  . Dyspnea  . Emesis, persistent  . Essential hypertension  . Failure to thrive in adult  . GERD (gastroesophageal reflux disease)  . Hyperglycemia  . Hyperlipidemia  . Hypoalbuminemia  . Hyponatremia  . Impingement syndrome of left shoulder region  . Intra-abdominal infection  . IPMN (intraductal papillary mucinous neoplasm)  .  Lactic acidosis  . Leukocytosis  . Lumbar pain  . Lumbar radiculopathy  . Lumbar spondylosis  . Lumbosacral spondylosis without myelopathy  . Malnutrition of moderate degree (CMS/HHS-HCC)  . Multiple pulmonary nodules  . Nuclear sclerotic cataract of left eye  . Pain of left hip joint  . Primary localized osteoarthritis of left hip  . Prolonged QT interval  . RBBB  . Sepsis (CMS/HHS-HCC)  . Sinus tachycardia  . Spinal stenosis in cervical region  . Tear of left rotator cuff  . Thrombocytosis  . UTI (urinary tract infection)  . Pancreatic fistula (HHS-HCC)  . Cervical spondylosis with myelopathy  . Encounter for postoperative care  . Stiffness of left shoulder joint  . Stiffness of right wrist joint    Past Surgical History:  Procedure Laterality Date  . PANCREATECTOMY DISTAL SUBTOTAL W/WO SPLENECTOMY LAPAROSCOPIC  08/30/2020     Allergies  Allergen Reactions  . Codeine Itching    Mood changes, can take percocet Mood changes, can take percocet   . Atorvastatin  Other (See Comments)    Pt reports causes muscle aches. Pt reports causes muscle aches.   . Ibuprofen Hives    Blisters  Blisters    . Alendronate Sodium Unknown    Other reaction(s): worse reflux  . Buspirone Hcl Unknown    Other reaction(s): headache  . Ezetimibe  Unknown    Other reaction(s): myalgias  . Temazepam Unknown    Other reaction(s): daytime drowsiness  . Tramadol  Hcl Unknown    Other reaction(s): dizzy  . Trazodone Hcl  Unknown    Other reaction(s): nightmares  . Valdecoxib Unknown    Other reaction(s): stomach pain  . Aspirin  Nausea  . Lisinopril Cough    Current Outpatient Medications on File Prior to Visit  Medication Sig Dispense Refill  . esomeprazole  (NEXIUM ) 40 MG DR capsule     . metFORMIN  (GLUCOPHAGE ) 500 MG tablet Take 500 mg by mouth 2 (two) times daily with meals    . metoprolol  tartrate (LOPRESSOR ) 50 MG tablet Take 50 mg by mouth 2 (two) times daily    .  multivitamin tablet Take 1 tablet by mouth once daily    . REPATHA  SURECLICK 140 mg/mL PnIj Inject 1 mL subcutaneously every 14 (fourteen) days    . acetaminophen  (TYLENOL ) 325 MG tablet Take by mouth    . aspirin  81 MG chewable tablet 1 tablet    . ciprofloxacin  HCl (CIPRO ) 500 MG tablet Take 500 mg by mouth 2 (two) times daily    . gabapentin (NEURONTIN) 300 MG capsule Take 300 mg by mouth 3 (three) times daily    . metoclopramide  (REGLAN ) 5 MG tablet Take by mouth    . rosuvastatin  (CRESTOR ) 5 MG tablet alternate 2 tablets and1 tablet    . Saccharomyces boulardii (FLORASTOR) 250 mg capsule Take 250 mg by mouth 2 (two) times daily     No current facility-administered medications on file prior to visit.    History reviewed. No pertinent family history.   Social History   Tobacco Use  Smoking Status Former  . Current packs/day: 0.00  . Types: Cigarettes  . Quit date: 68  . Years since quitting: 32.7  Smokeless Tobacco Never     Social History   Socioeconomic History  . Marital status: Married  Tobacco Use  . Smoking status: Former    Current packs/day: 0.00    Types: Cigarettes    Quit date: 1993    Years since quitting: 32.7  . Smokeless tobacco: Never  Vaping Use  . Vaping status: Never Used  Substance and Sexual Activity  . Alcohol use: Never  . Drug use: Never  . Sexual activity: Defer   Social Drivers of Health    Received from [facility]   Social Network  Housing Stability: Unknown (10/15/2023)   Housing Stability Vital Sign   . Homeless in the Last Year: No    Objective:    Vitals:   10/15/23 1417  BP: 120/74  Pulse: 88  Temp: 36.8 C (98.3 F)  TempSrc: Temporal  SpO2: 95%  Weight: 68.3 kg (150 lb 9.6 oz)  Height: 165.1 cm (5' 5)  PainSc: 0-No pain    Body mass index is 25.06 kg/m.  Physical Exam Vitals reviewed.  Constitutional:      General: She is not in acute distress.    Appearance: Normal appearance.  Pulmonary:      Effort: Pulmonary effort is normal. No respiratory distress.  Abdominal:     General: There is no distension.     Palpations: Abdomen is soft.     Tenderness: There is no abdominal tenderness.     Comments: Well-healed port site surgical scars.  Skin:    General: Skin is warm and dry.     Coloration: Skin is not jaundiced.  Neurological:     General: No focal deficit present.     Mental Status: She is alert and oriented to person, place, and time.        Labs, Imaging and Diagnostic Testing: MRI 10/06/23: IMPRESSION:  1. Unchanged postoperative appearance of distal pancreatectomy and splenectomy. No evidence of recurrent or metastatic disease in the abdomen. 2. Minimal sludge in the gallbladder. No gallbladder wall thickening, or biliary dilatation. 3. Descending and sigmoid diverticulosis.    Assessment and Plan:     Diagnoses and all orders for this visit:  IPMN (intraductal papillary mucinous neoplasm) -     MRI abdomen and MRCP w wo contrast w 3D; Future  Asplenia     83 yo female with a history of pancreatic IPMN with high grade dysplasia, now 3 years s/p distal pancreatectomy with splenectomy. She has no new concerning symptoms, and MRI does not show any signs of recurrent cyst, and no main PD dilation or change in caliber. As she had high grade dysplasia, will continue annual imaging surveillance for 5 years postop. She will follow up in 1 year with an MRCP. She received the Bexsero  vaccine, most recently in 2022, and should have another booster dose based on the most recent guidelines. This has been ordered and she will go to the [facility] outpatient pharmacy tomorrow to receive this. She is otherwise up to date on post-splenectomy vaccines.  Return in about 1 year (around 10/14/2024).   SHELBY LYNN ALLEN, MD

## 2023-10-16 ENCOUNTER — Other Ambulatory Visit (HOSPITAL_COMMUNITY): Payer: Self-pay

## 2023-10-23 ENCOUNTER — Other Ambulatory Visit (HOSPITAL_COMMUNITY): Payer: Self-pay

## 2023-11-02 DIAGNOSIS — Z23 Encounter for immunization: Secondary | ICD-10-CM | POA: Diagnosis not present

## 2023-11-13 ENCOUNTER — Other Ambulatory Visit (HOSPITAL_COMMUNITY): Payer: Self-pay

## 2023-11-14 ENCOUNTER — Other Ambulatory Visit (HOSPITAL_COMMUNITY): Payer: Self-pay

## 2023-11-21 ENCOUNTER — Other Ambulatory Visit (HOSPITAL_COMMUNITY): Payer: Self-pay

## 2023-11-22 ENCOUNTER — Other Ambulatory Visit (HOSPITAL_COMMUNITY): Payer: Self-pay

## 2023-11-22 DIAGNOSIS — L570 Actinic keratosis: Secondary | ICD-10-CM | POA: Diagnosis not present

## 2023-11-22 DIAGNOSIS — L438 Other lichen planus: Secondary | ICD-10-CM | POA: Diagnosis not present

## 2023-11-30 ENCOUNTER — Other Ambulatory Visit (HOSPITAL_COMMUNITY): Payer: Self-pay

## 2023-12-01 ENCOUNTER — Other Ambulatory Visit (HOSPITAL_COMMUNITY): Payer: Self-pay

## 2023-12-20 ENCOUNTER — Encounter: Payer: Self-pay | Admitting: Oncology

## 2023-12-31 ENCOUNTER — Other Ambulatory Visit (HOSPITAL_COMMUNITY): Payer: Self-pay

## 2023-12-31 DIAGNOSIS — M25551 Pain in right hip: Secondary | ICD-10-CM | POA: Diagnosis not present

## 2023-12-31 DIAGNOSIS — M5412 Radiculopathy, cervical region: Secondary | ICD-10-CM | POA: Diagnosis not present

## 2023-12-31 DIAGNOSIS — M5416 Radiculopathy, lumbar region: Secondary | ICD-10-CM | POA: Diagnosis not present

## 2024-01-28 ENCOUNTER — Encounter: Payer: Self-pay | Admitting: *Deleted

## 2024-02-07 ENCOUNTER — Encounter: Payer: Self-pay | Admitting: Cardiovascular Disease

## 2024-02-26 ENCOUNTER — Other Ambulatory Visit (HOSPITAL_COMMUNITY): Payer: Self-pay

## 2024-02-27 ENCOUNTER — Other Ambulatory Visit (HOSPITAL_COMMUNITY): Payer: Self-pay

## 2024-02-27 MED ORDER — REPATHA SURECLICK 140 MG/ML ~~LOC~~ SOAJ
140.0000 mg | SUBCUTANEOUS | 3 refills | Status: AC
  Filled 2024-02-27: qty 6, 84d supply, fill #0

## 2024-03-05 ENCOUNTER — Other Ambulatory Visit (HOSPITAL_COMMUNITY): Payer: Self-pay

## 2024-03-05 ENCOUNTER — Other Ambulatory Visit: Payer: Self-pay
